# Patient Record
Sex: Female | Born: 1950 | Race: White | Hispanic: No | Smoking: Current every day smoker
Health system: Southern US, Community
[De-identification: ages and names within clinical notes are randomized; demographics above are authoritative.]

## PROBLEM LIST (undated history)

## (undated) DIAGNOSIS — H269 Unspecified cataract: Secondary | ICD-10-CM

## (undated) DIAGNOSIS — E119 Type 2 diabetes mellitus without complications: Secondary | ICD-10-CM

## (undated) DIAGNOSIS — K219 Gastro-esophageal reflux disease without esophagitis: Secondary | ICD-10-CM

## (undated) DIAGNOSIS — G629 Polyneuropathy, unspecified: Secondary | ICD-10-CM

## (undated) DIAGNOSIS — Z8659 Personal history of other mental and behavioral disorders: Secondary | ICD-10-CM

## (undated) DIAGNOSIS — J309 Allergic rhinitis, unspecified: Secondary | ICD-10-CM

## (undated) DIAGNOSIS — B1910 Unspecified viral hepatitis B without hepatic coma: Secondary | ICD-10-CM

## (undated) DIAGNOSIS — F329 Major depressive disorder, single episode, unspecified: Secondary | ICD-10-CM

## (undated) DIAGNOSIS — K922 Gastrointestinal hemorrhage, unspecified: Secondary | ICD-10-CM

## (undated) DIAGNOSIS — K5790 Diverticulosis of intestine, part unspecified, without perforation or abscess without bleeding: Secondary | ICD-10-CM

## (undated) DIAGNOSIS — N289 Disorder of kidney and ureter, unspecified: Secondary | ICD-10-CM

## (undated) DIAGNOSIS — F419 Anxiety disorder, unspecified: Secondary | ICD-10-CM

## (undated) DIAGNOSIS — G252 Other specified forms of tremor: Secondary | ICD-10-CM

## (undated) DIAGNOSIS — G510 Bell's palsy: Secondary | ICD-10-CM

## (undated) DIAGNOSIS — L409 Psoriasis, unspecified: Secondary | ICD-10-CM

## (undated) DIAGNOSIS — M1990 Unspecified osteoarthritis, unspecified site: Secondary | ICD-10-CM

## (undated) DIAGNOSIS — I10 Essential (primary) hypertension: Secondary | ICD-10-CM

## (undated) DIAGNOSIS — K7460 Unspecified cirrhosis of liver: Secondary | ICD-10-CM

## (undated) DIAGNOSIS — I81 Portal vein thrombosis: Secondary | ICD-10-CM

## (undated) DIAGNOSIS — G4700 Insomnia, unspecified: Secondary | ICD-10-CM

## (undated) DIAGNOSIS — I82409 Acute embolism and thrombosis of unspecified deep veins of unspecified lower extremity: Secondary | ICD-10-CM

## (undated) DIAGNOSIS — C189 Malignant neoplasm of colon, unspecified: Secondary | ICD-10-CM

## (undated) DIAGNOSIS — D509 Iron deficiency anemia, unspecified: Secondary | ICD-10-CM

## (undated) HISTORY — DX: Bell's palsy: G51.0

## (undated) HISTORY — DX: Unspecified cataract: H26.9

## (undated) HISTORY — PX: CATARACT EXTRACTION, BILATERAL: SHX1313

## (undated) HISTORY — PX: ANKLE SURGERY: SHX546

## (undated) HISTORY — DX: Allergic rhinitis, unspecified: J30.9

## (undated) HISTORY — DX: Polyneuropathy, unspecified: G62.9

## (undated) HISTORY — DX: Psoriasis, unspecified: L40.9

## (undated) HISTORY — DX: Unspecified viral hepatitis B without hepatic coma: B19.10

## (undated) HISTORY — DX: Anxiety disorder, unspecified: F41.9

## (undated) HISTORY — DX: Gastro-esophageal reflux disease without esophagitis: K21.9

## (undated) HISTORY — DX: Acute embolism and thrombosis of unspecified deep veins of unspecified lower extremity: I82.409

---

## 2000-08-30 ENCOUNTER — Inpatient Hospital Stay (HOSPITAL_COMMUNITY): Admission: EM | Admit: 2000-08-30 | Discharge: 2000-09-01 | Payer: Self-pay | Admitting: Emergency Medicine

## 2000-08-30 ENCOUNTER — Encounter: Payer: Self-pay | Admitting: Emergency Medicine

## 2000-08-30 ENCOUNTER — Encounter: Payer: Self-pay | Admitting: Orthopedic Surgery

## 2000-11-04 ENCOUNTER — Encounter: Admission: RE | Admit: 2000-11-04 | Discharge: 2001-02-02 | Payer: Self-pay | Admitting: Orthopedic Surgery

## 2007-04-09 ENCOUNTER — Emergency Department (HOSPITAL_COMMUNITY): Admission: EM | Admit: 2007-04-09 | Discharge: 2007-04-09 | Payer: Self-pay | Admitting: Emergency Medicine

## 2007-04-09 IMAGING — CT CT HEAD W/O CM
1 series · 16 of 30 positions shown, 20 images · IV contrast (agent unspecified)
Comparison: None.

CLINICAL DATA: Right facial droop since this morning.
 HEAD CT WITHOUT CONTRAST:
TECHNIQUE: Contiguous axial images were obtained from the base of the skull through the vertex according to standard protocol without contrast.

[Series 2: head_seq 4.5 h37s st · axial · 0.43mm/px · z∈[-457,-313]mm · 16 of 36 slices shown, 20 images]
[im 2/36  brain]
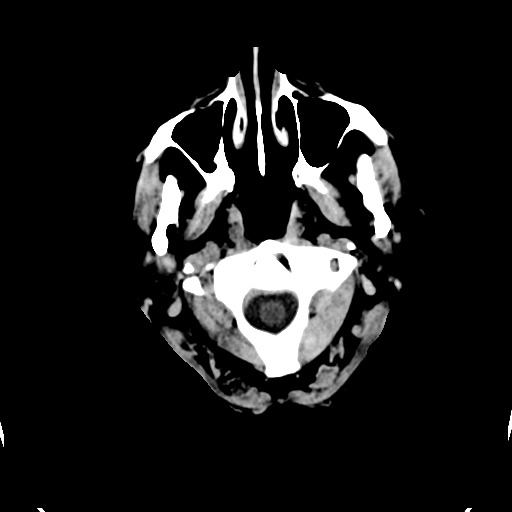
[im 2/36  bone]
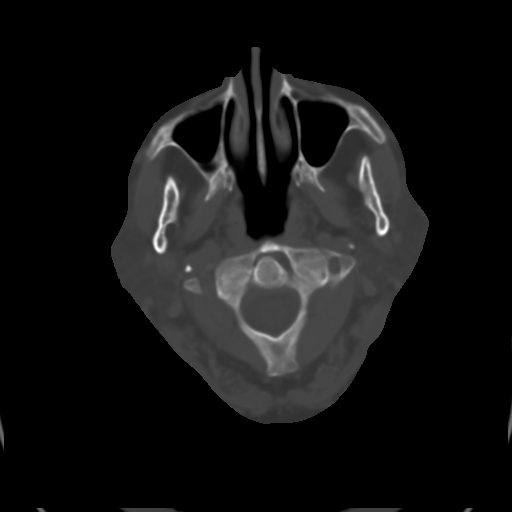
[im 4/36  brain]
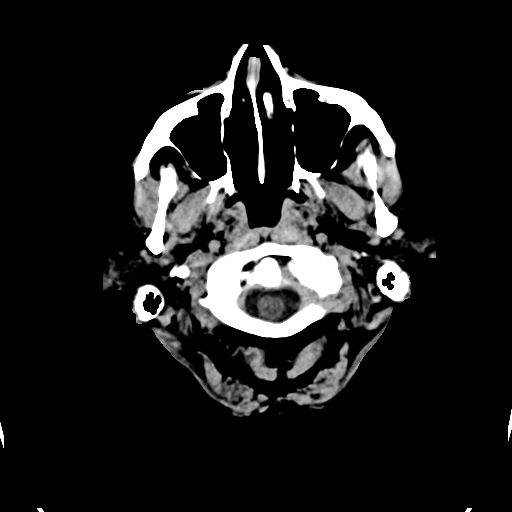
[im 7/36  brain]
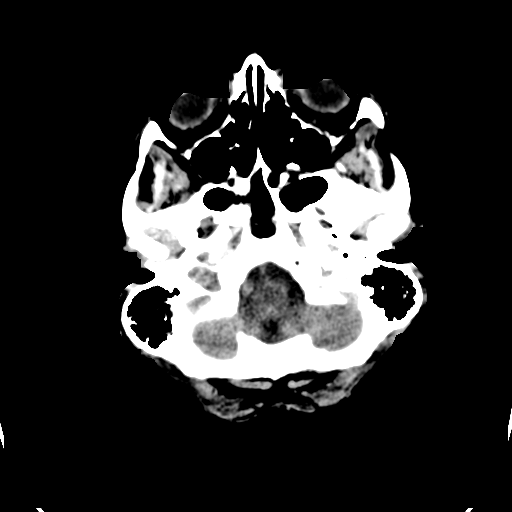
[im 9/36  brain]
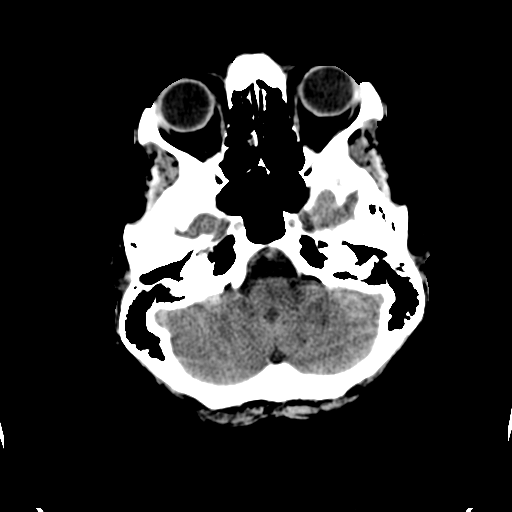
[im 10/36  brain]
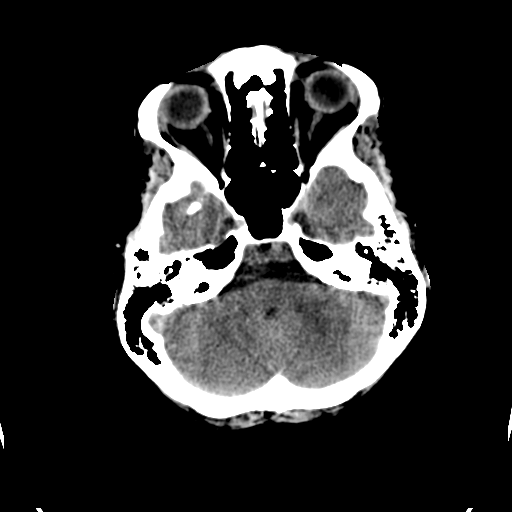
[im 10/36  bone]
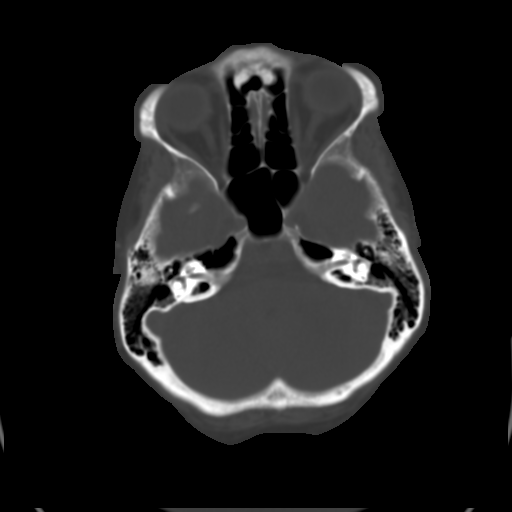
[im 13/36  brain]
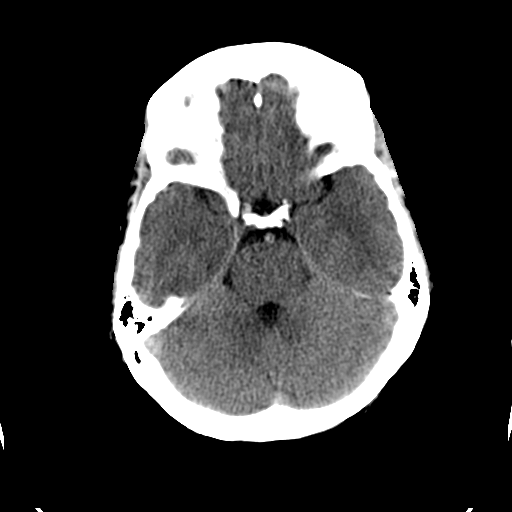
[im 15/36  brain]
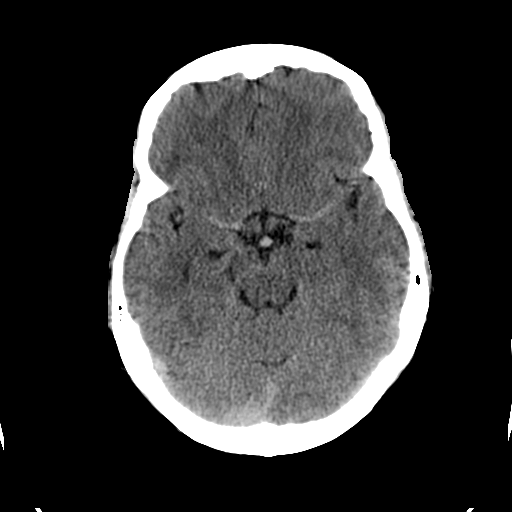
[im 17/36  brain]
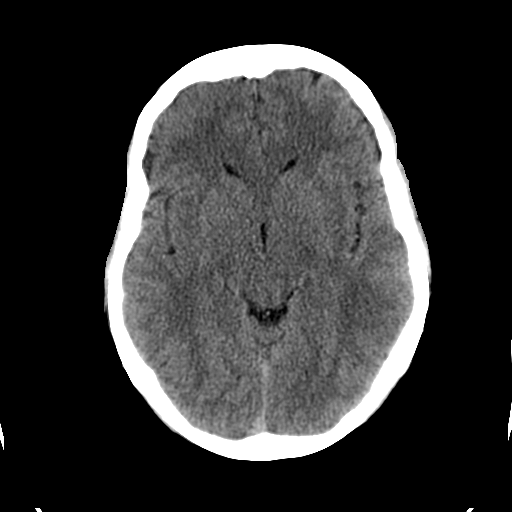
[im 19/36  brain]
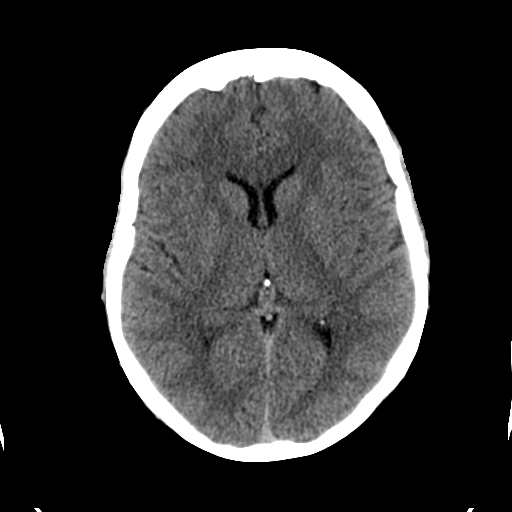
[im 19/36  bone]
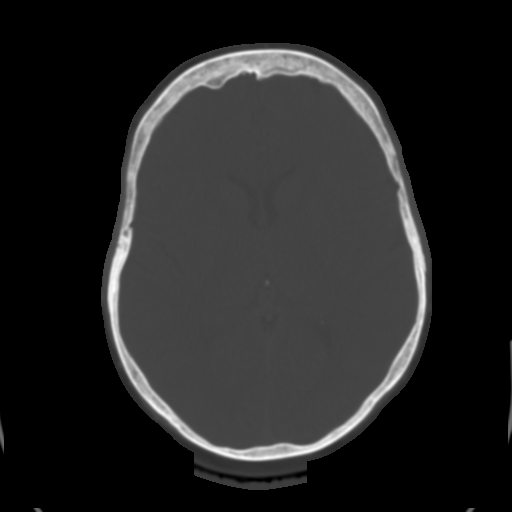
[im 21/36  brain]
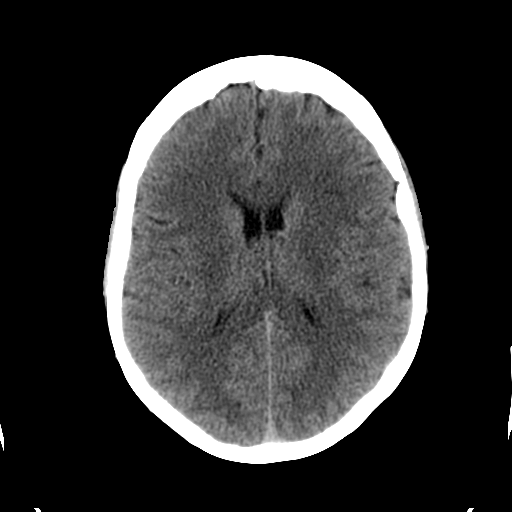
[im 23/36  brain]
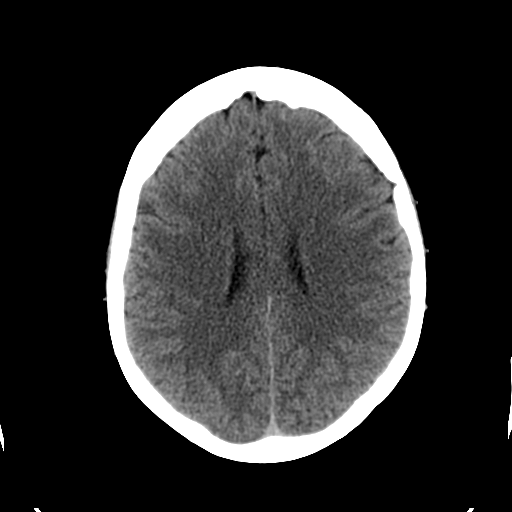
[im 26/36  brain]
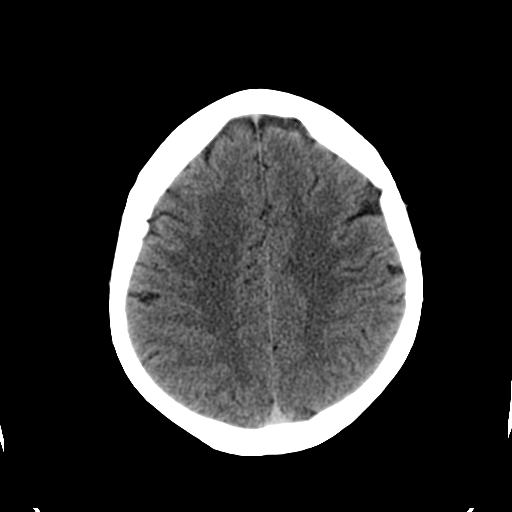
[im 27/36  brain]
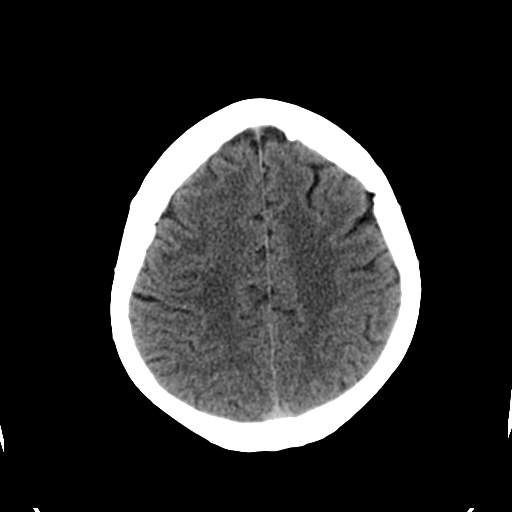
[im 27/36  bone]
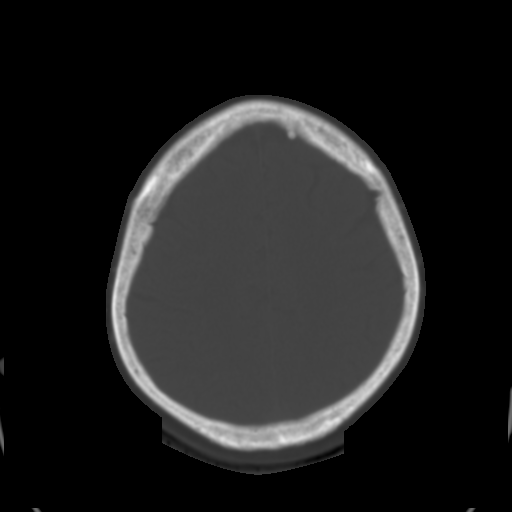
[im 29/36  brain]
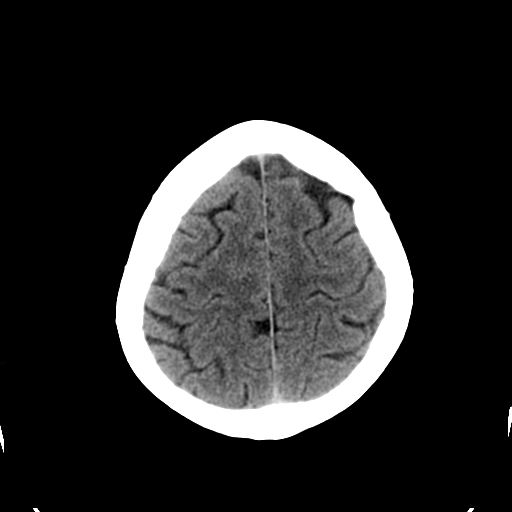
[im 32/36  brain]
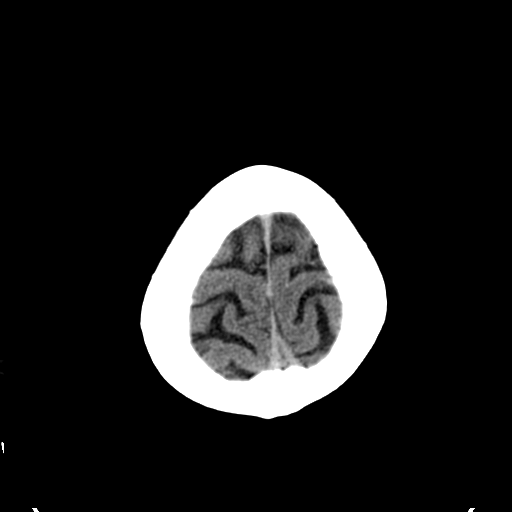
[im 34/36  brain]
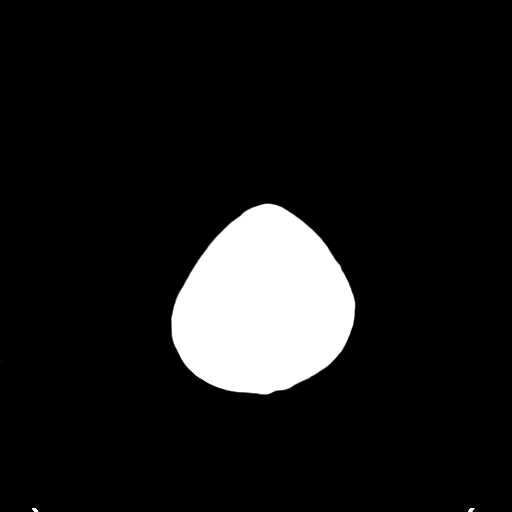

[16 of 30 positions shown; findings below may reference images not displayed]

FINDINGS: No evidence of acute infarct, hemorrhage, mass, mass effect or hydrocephalus.  Visualized paranasal sinuses and mastoid air cells are clear with the exception of opacification of a single right ethmoid air cell.
IMPRESSION: No acute findings.

## 2010-09-15 NOTE — H&P (Signed)
[place]  Patient:    Tonya Dougherty, Tonya Dougherty                        MRN: [id number] Adm. Date:  [id number] Attending:  Wende Mott                         History and Physical  CHIEF COMPLAINT: Painful, deformed right ankle.  HISTORY OF PRESENT ILLNESS: This patient is a 60 year old female who was coming down the stairs with a drawer in her hand and missed a step, turning the right ankle.  The patient developed severe pain and deformity of the right ankle and also pain and swelling of the left ankle.  There was no loss of consciousness or any other injuries.  The patient was brought to [facility] for treatment.  CURRENT MEDICATIONS: Prozac 20 mg q.d.  ALLERGIES: None known.  SOCIAL HISTORY: Smokes half a pack a day.  FAMILY HISTORY: Noncontributory.  PAST SURGICAL HISTORY: No previous operations.  PHYSICAL EXAMINATION:  VITAL SIGNS: Temperature 97.7 degrees, pulse 97, respirations 20, blood pressure 160/98.  HEENT: Normocephalic.  Conjunctivae and sclerae clear.  NECK: Supple.  CHEST: Clear.  CARDIAC: S1 and S2, regular.  EXTREMITIES: Right ankle grossly deformed with obvious anterior dislocation, tender and swollen.  Nailbeds pink and blanche.  Dorsalis pedis sluggish to palpation.  Left ankle tender and swollen over the lateral malleolus.  Range of motion is good.  Good active and passive range of motion.  Tender anterior talofibular and calcaneofibular ligament.  LABORATORY DATA: X-ray revealed fracture dislocation of the right ankle.  Left of left ankle shows only a small chip avulsion off the lateral malleolus.  IMPRESSION:  1. Fracture dislocation of right ankle.  2. Spurring of left ankle. DD:  08/30/00 TD:  09/01/00 Job: [id number]

## 2010-09-15 NOTE — Discharge Summary (Signed)
[facility]  Patient:    Tonya Dougherty, Tonya Dougherty                        MRN: [id number] Adm. Date:  [id number] Disc. Date: [id number] Attending:  Wende Mott                           Discharge Summary  ADMITTING DIAGNOSES: 1. Bimalleolar fracture dislocation, right ankle. 2. Sprain of the left ankle.  DISCHARGE DIAGNOSES: 1. Bimalleolar fracture dislocation, right ankle. 2. Sprain of the left ankle.  COMPLICATIONS:  None.  INFECTIONS:  None.  OPERATION:  Open reduction and internal fixation of right ankle.  PERTINENT HISTORY:  This is a 60 year old female who had fallen down some stairs and sustained injury to both ankles.  Patient came to [facility] emergency room for treatment.  PERTINENT PHYSICAL EXAMINATION:  EXTREMITIES:  Right ankle - gross deformity with obvious anterior dislocation, tenderness, and swelling.  Nail beds pink and ______ quite well.  Sluggish toe movement.  Left ankle - tender, swollen lateral malleoli.  Range of motion was good.  Tender anterior talofibular and calcaneofibular ligament.  LABORATORY DATA:  X-rays revealed fracture dislocation, bimalleolar, right ankle.  Left ankle - small chip avulsion off the lateral malleolus.  HOSPITAL COURSE:  Patient was placed in an ankle brace over the left ankle, underwent ORIF of the right ankle, and an application of a short-leg cast postoperatively.  Course was benign.  Patients pain was under control with p.o. Percocet, ice packs, elevation, use of crutches, nonweightbearing on the right side.  Patient had more difficulty due to left ankle sprain and progressed fairly well but, still, was having to use a wheelchair due to difficulty with the left ankle.  Patient then became stable enough to be discharged on Daypro 670 b.i.d., Percocet one q.4h. p.r.n. for pain, ice packs, elevation, nonweightbearing on the right side, and to return to the office in two weeks.   Patient was discharged in stable and satisfactory condition. DD:  11/20/00 TD:  11/20/00 Job: [id number]

## 2013-03-19 ENCOUNTER — Inpatient Hospital Stay: Payer: Self-pay | Admitting: Internal Medicine

## 2013-03-19 LAB — COMPREHENSIVE METABOLIC PANEL
Albumin: 2.1 g/dL — ABNORMAL LOW (ref 3.4–5.0)
Alkaline Phosphatase: 94 U/L (ref 50–136)
Anion Gap: 12 (ref 7–16)
BUN: 27 mg/dL — ABNORMAL HIGH (ref 7–18)
Bilirubin,Total: 0.3 mg/dL (ref 0.2–1.0)
Calcium, Total: 7.2 mg/dL — ABNORMAL LOW (ref 8.5–10.1)
Chloride: 112 mmol/L — ABNORMAL HIGH (ref 98–107)
Co2: 18 mmol/L — ABNORMAL LOW (ref 21–32)
Creatinine: 1.03 mg/dL (ref 0.60–1.30)
EGFR (African American): >60
EGFR (Non-African Amer.): 58 — ABNORMAL LOW
Glucose: 237 mg/dL — ABNORMAL HIGH (ref 65–99)
Osmolality: 296 (ref 275–301)
Potassium: 3.3 mmol/L — ABNORMAL LOW (ref 3.5–5.1)
SGOT(AST): 74 U/L — ABNORMAL HIGH (ref 15–37)
SGPT (ALT): 28 U/L (ref 12–78)
Sodium: 142 mmol/L (ref 136–145)
Total Protein: 4.7 g/dL — ABNORMAL LOW (ref 6.4–8.2)

## 2013-03-19 LAB — CBC WITH DIFFERENTIAL/PLATELET
Basophil #: 0 10*3/uL (ref 0.0–0.1)
Basophil %: 0 %
Eosinophil #: 0 10*3/uL (ref 0.0–0.7)
Eosinophil %: 0.1 %
HCT: 24.4 % — ABNORMAL LOW (ref 35.0–47.0)
Lymphocyte #: 1.9 10*3/uL (ref 1.0–3.6)
MCHC: 32.2 g/dL (ref 32.0–36.0)
Monocyte %: 5.5 %
Neutrophil #: 17.9 10*3/uL — ABNORMAL HIGH (ref 1.4–6.5)
Platelet: 173 10*3/uL (ref 150–440)

## 2013-03-19 LAB — CBC
HCT: 17.6 % — ABNORMAL LOW (ref 35.0–47.0)
HGB: 5.3 g/dL — ABNORMAL LOW (ref 12.0–16.0)
MCH: 25.4 pg — ABNORMAL LOW (ref 26.0–34.0)
MCHC: 30.2 g/dL — ABNORMAL LOW (ref 32.0–36.0)
MCV: 84 fL (ref 80–100)
Platelet: 196 10*3/uL (ref 150–440)
RBC: 2.09 10*6/uL — ABNORMAL LOW (ref 3.80–5.20)
RDW: 21 % — ABNORMAL HIGH (ref 11.5–14.5)
WBC: 12.6 10*3/uL — ABNORMAL HIGH (ref 3.6–11.0)

## 2013-03-19 LAB — TROPONIN I: Troponin-I: 0.06 ng/mL — ABNORMAL HIGH

## 2013-03-19 LAB — PROTIME-INR
INR: 1.5
Prothrombin Time: 17.9 secs — ABNORMAL HIGH (ref 11.5–14.7)

## 2013-03-19 LAB — IRON AND TIBC
Iron Bind.Cap.(Total): 342 ug/dL (ref 250–450)
Iron: 31 ug/dL — ABNORMAL LOW (ref 50–170)
Unbound Iron-Bind.Cap.: 311 ug/dL

## 2013-03-19 IMAGING — US ABDOMEN ULTRASOUND LIMITED
1 series · 14 of 25 positions shown · non-contrast
Comparison: None.

CLINICAL DATA: Elevated liver function tests.

EXAM:
US ABDOMEN LIMITED - RIGHT UPPER QUADRANT

[Series 1: abdomen ultrasound limited · 0.31mm/px · 14 of 46 slices shown]
[im 1/46]
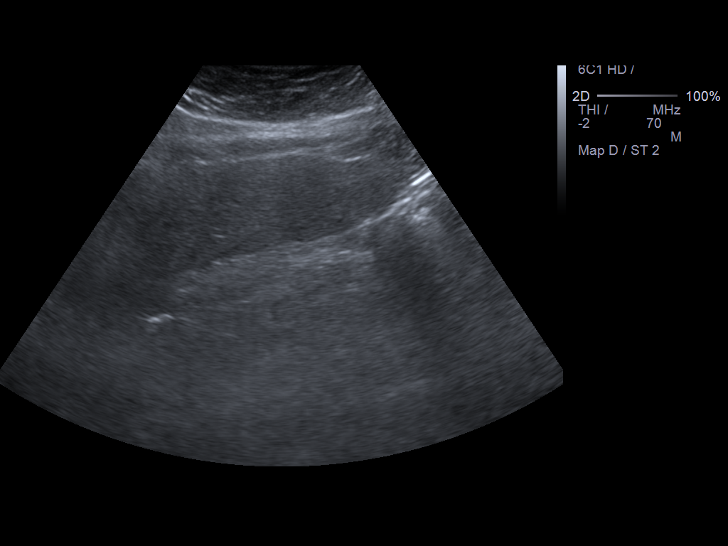
[im 4/46]
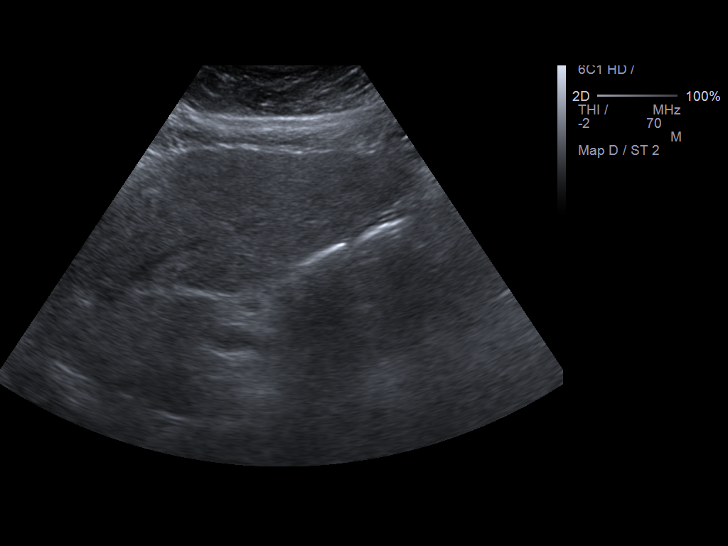
[im 8/46]
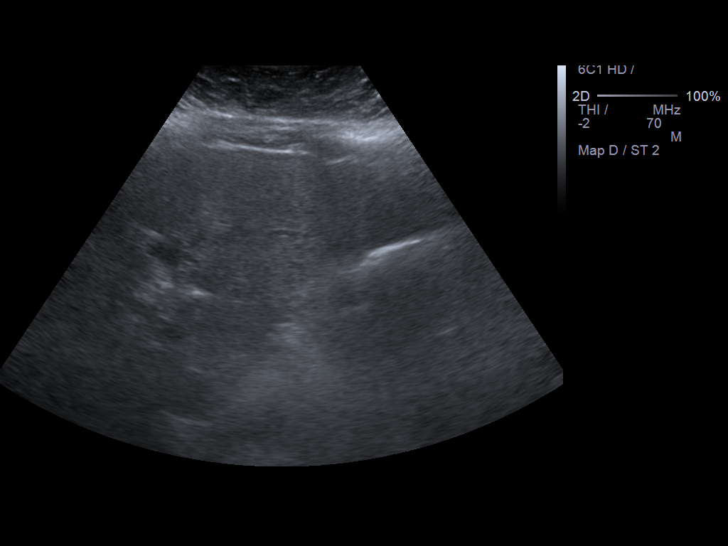
[im 12/46]
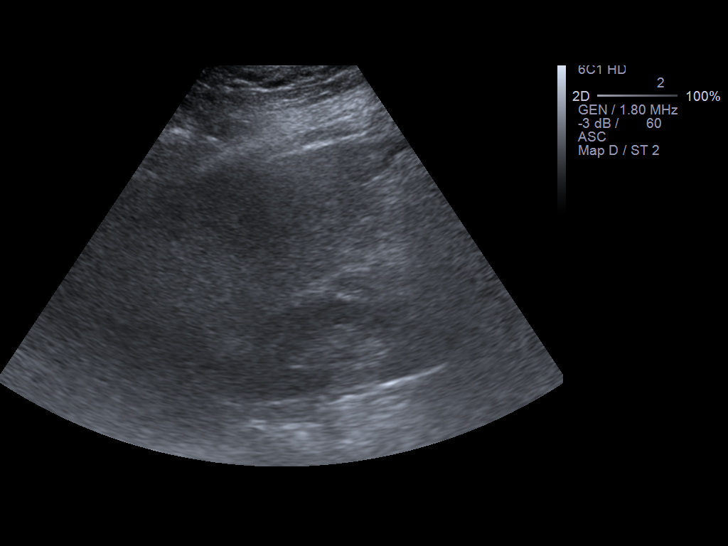
[im 16/46]
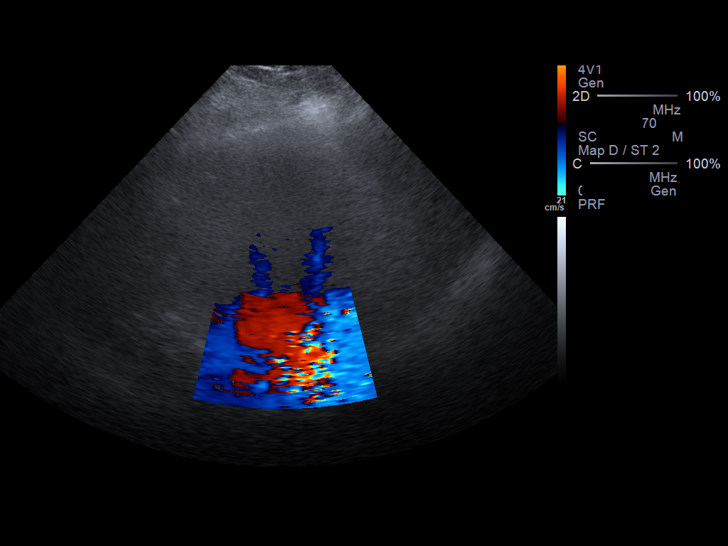
[im 17/46]
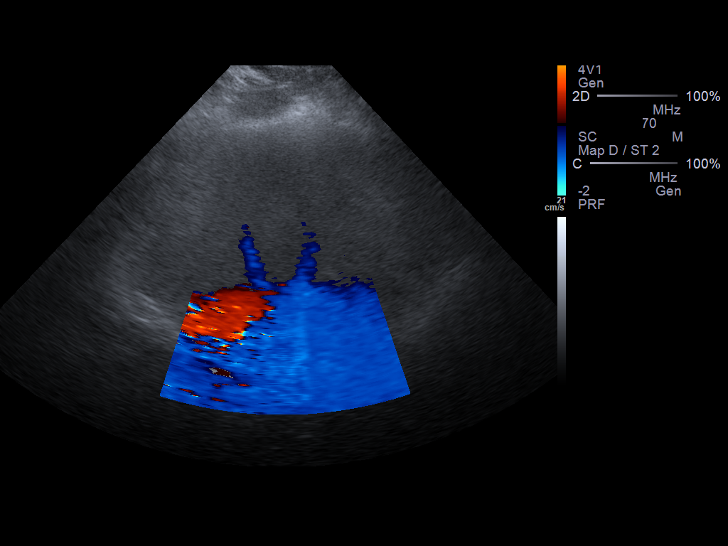
[im 21/46]
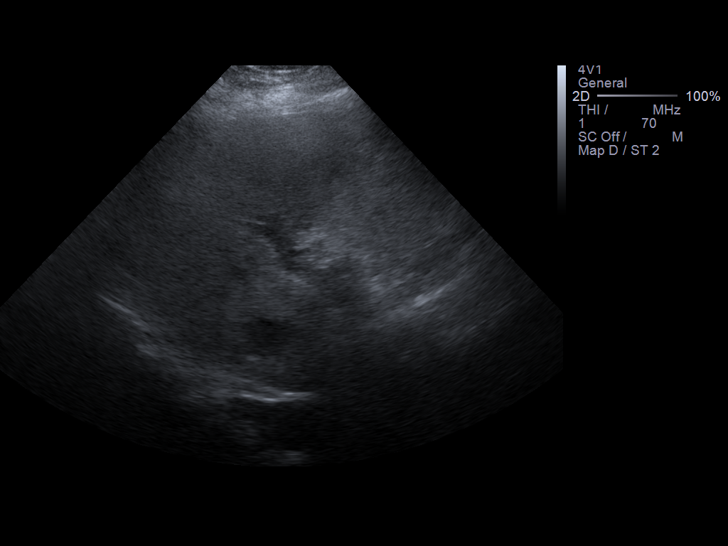
[im 25/46]
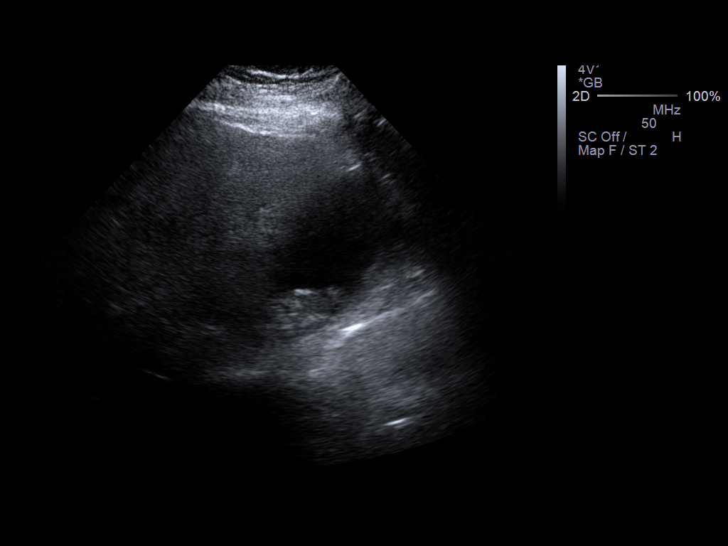
[im 29/46]
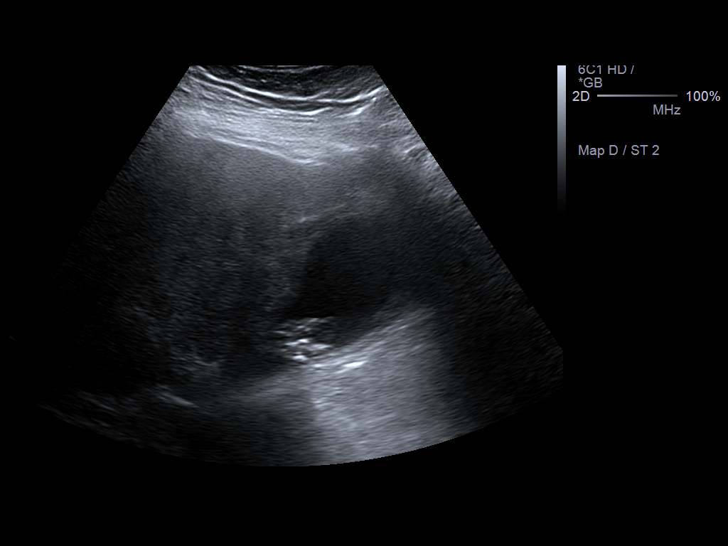
[im 31/46]
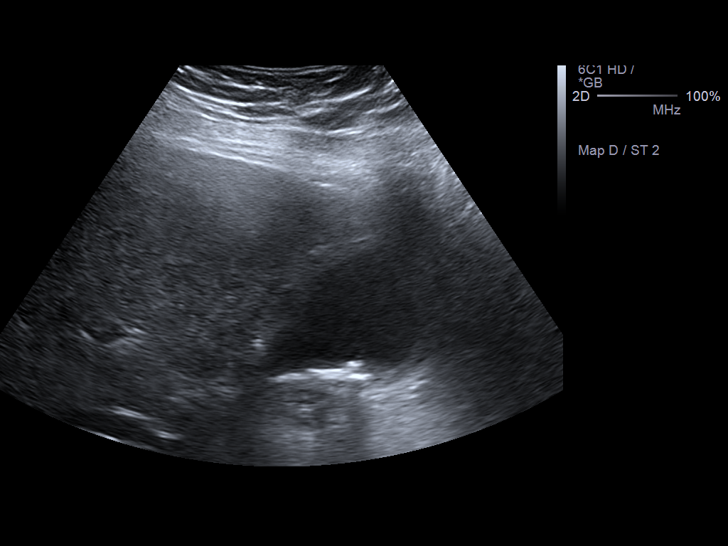
[im 34/46]
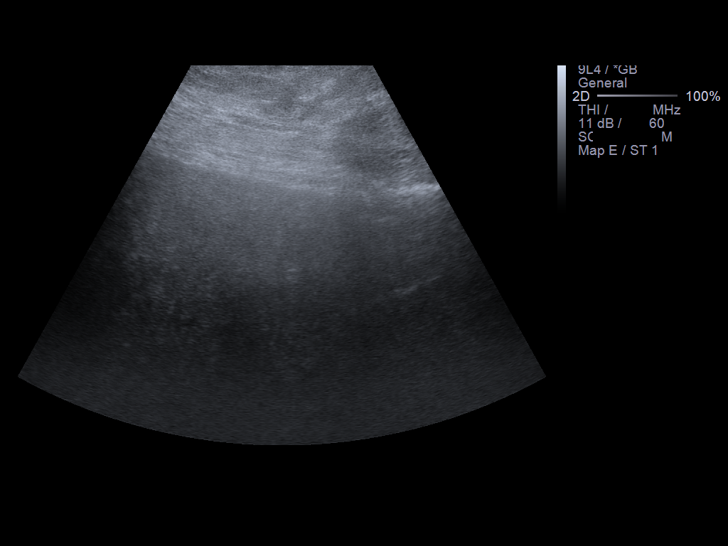
[im 38/46]
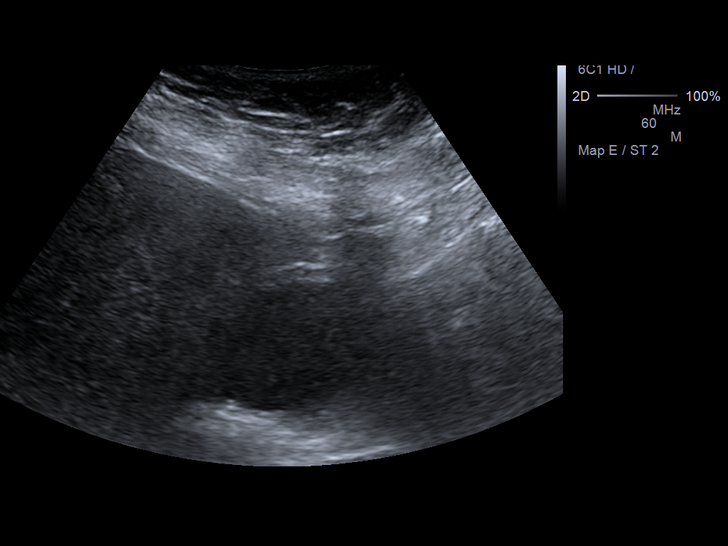
[im 42/46]
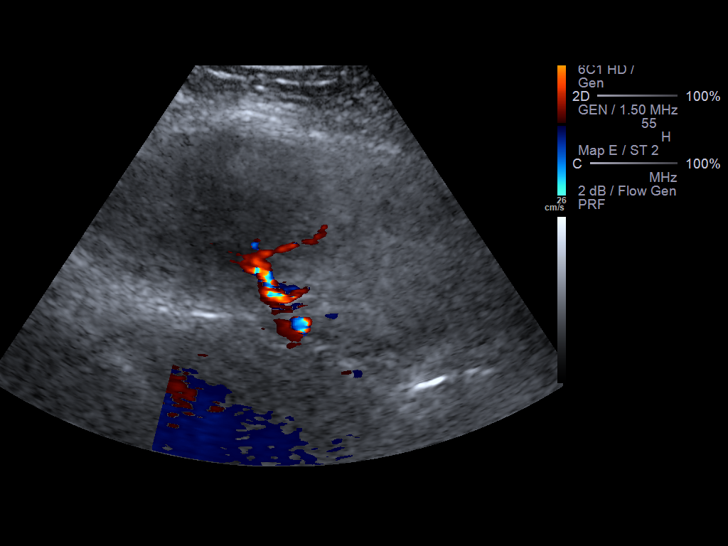
[im 46/46]
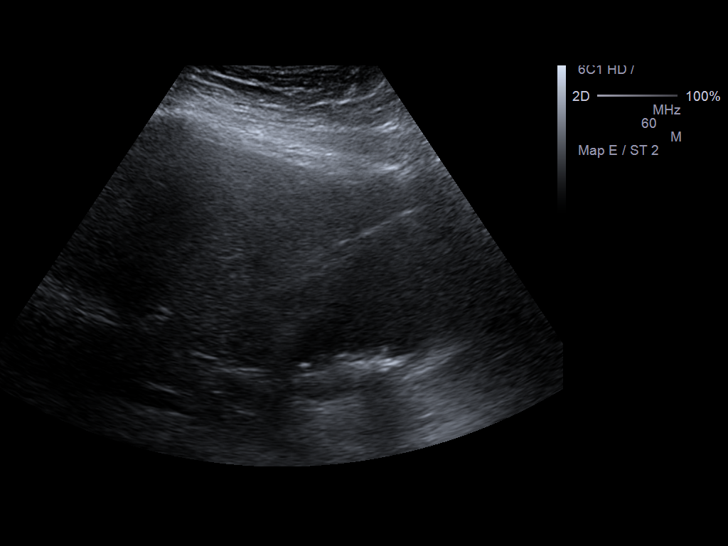

[14 of 25 positions shown; findings below may reference images not displayed]

FINDINGS: Gallbladder

Multiple gallstones and sludge are present. Gallbladder wall is
measured at 6 mm consistent with moderate thickening. No sonographic
Murphy's sign is noted.

Common bile duct

Diameter: Measures 4.9 mm which is within normal limits.

Liver:

No focal abnormality is noted. Coarse echotexture of hepatic
parenchyma is noted with possible nodular contour suggesting hepatic
cirrhosis or other diffuse hepatocellular disease.
IMPRESSION: Cholelithiasis is noted with associated gallbladder wall thickening;
cholecystitis cannot be excluded and HIDA scan may be performed for
further evaluation. Coarse echotexture of hepatic parenchyma is
noted with possible nodular contour suggesting hepatic cirrhosis or
other diffuse hepatocellular disease.

## 2013-03-20 LAB — BASIC METABOLIC PANEL
Anion Gap: 6 — ABNORMAL LOW (ref 7–16)
BUN: 25 mg/dL — ABNORMAL HIGH (ref 7–18)
Co2: 24 mmol/L (ref 21–32)
Creatinine: 0.78 mg/dL (ref 0.60–1.30)
EGFR (African American): >60
EGFR (Non-African Amer.): >60
Glucose: 143 mg/dL — ABNORMAL HIGH (ref 65–99)
Osmolality: 299 (ref 275–301)
Potassium: 4.2 mmol/L (ref 3.5–5.1)

## 2013-03-20 LAB — PROTIME-INR: INR: 1.3

## 2013-03-20 LAB — CBC WITH DIFFERENTIAL/PLATELET
Eosinophil #: 0.1 10*3/uL (ref 0.0–0.7)
Lymphocyte #: 3.2 10*3/uL (ref 1.0–3.6)
MCHC: 32 g/dL (ref 32.0–36.0)
Monocyte #: 0.9 x10 3/mm (ref 0.2–0.9)

## 2013-03-20 LAB — HEMOGLOBIN: HGB: 8.2 g/dL — ABNORMAL LOW (ref 12.0–16.0)

## 2013-03-21 LAB — COMPREHENSIVE METABOLIC PANEL
Albumin: 2.3 g/dL — ABNORMAL LOW (ref 3.4–5.0)
Bilirubin,Total: 0.7 mg/dL (ref 0.2–1.0)
Chloride: 114 mmol/L — ABNORMAL HIGH (ref 98–107)
Creatinine: 0.73 mg/dL (ref 0.60–1.30)
EGFR (African American): >60
Glucose: 126 mg/dL — ABNORMAL HIGH (ref 65–99)
SGOT(AST): 150 U/L — ABNORMAL HIGH (ref 15–37)
SGPT (ALT): 97 U/L — ABNORMAL HIGH (ref 12–78)
Sodium: 142 mmol/L (ref 136–145)

## 2013-03-21 LAB — CBC WITH DIFFERENTIAL/PLATELET
Basophil #: 0.1 10*3/uL (ref 0.0–0.1)
Eosinophil #: 0.1 10*3/uL (ref 0.0–0.7)
HGB: 7.8 g/dL — ABNORMAL LOW (ref 12.0–16.0)
Lymphocyte #: 3.1 10*3/uL (ref 1.0–3.6)
MCH: 28.4 pg (ref 26.0–34.0)
MCV: 87 fL (ref 80–100)
Monocyte #: 1 x10 3/mm — ABNORMAL HIGH (ref 0.2–0.9)
Monocyte %: 8.7 %
RDW: 18.3 % — ABNORMAL HIGH (ref 11.5–14.5)

## 2013-03-22 LAB — CBC WITH DIFFERENTIAL/PLATELET
Basophil #: 0.1 10*3/uL (ref 0.0–0.1)
Eosinophil #: 0.1 10*3/uL (ref 0.0–0.7)
Eosinophil %: 0.9 %
HCT: 23.1 % — ABNORMAL LOW (ref 35.0–47.0)
HGB: 7.5 g/dL — ABNORMAL LOW (ref 12.0–16.0)
Lymphocyte #: 2.5 10*3/uL (ref 1.0–3.6)
Lymphocyte %: 18.2 %
MCH: 28.2 pg (ref 26.0–34.0)
MCHC: 32.5 g/dL (ref 32.0–36.0)
MCV: 87 fL (ref 80–100)
RBC: 2.66 10*6/uL — ABNORMAL LOW (ref 3.80–5.20)
RDW: 18 % — ABNORMAL HIGH (ref 11.5–14.5)
WBC: 14 10*3/uL — ABNORMAL HIGH (ref 3.6–11.0)

## 2013-03-23 LAB — CBC WITH DIFFERENTIAL/PLATELET
Basophil %: 0.8 %
Eosinophil %: 1.4 %
HCT: 23.8 % — ABNORMAL LOW (ref 35.0–47.0)
HGB: 7.7 g/dL — ABNORMAL LOW (ref 12.0–16.0)
Lymphocyte #: 3.3 10*3/uL (ref 1.0–3.6)
Lymphocyte %: 27.7 %
MCH: 28.3 pg (ref 26.0–34.0)
MCV: 88 fL (ref 80–100)
Monocyte #: 1.4 x10 3/mm — ABNORMAL HIGH (ref 0.2–0.9)
Neutrophil #: 6.9 10*3/uL — ABNORMAL HIGH (ref 1.4–6.5)
Neutrophil %: 58.3 %
RDW: 18 % — ABNORMAL HIGH (ref 11.5–14.5)

## 2013-03-24 LAB — CBC WITH DIFFERENTIAL/PLATELET
Basophil #: 0.1 10*3/uL (ref 0.0–0.1)
Eosinophil #: 0.2 10*3/uL (ref 0.0–0.7)
Lymphocyte #: 2.2 10*3/uL (ref 1.0–3.6)
Lymphocyte %: 22.1 %
MCH: 28 pg (ref 26.0–34.0)
MCV: 88 fL (ref 80–100)
Monocyte #: 1.2 x10 3/mm — ABNORMAL HIGH (ref 0.2–0.9)
Monocyte %: 12.2 %
Neutrophil #: 6.3 10*3/uL (ref 1.4–6.5)
Platelet: 184 10*3/uL (ref 150–440)
WBC: 10.1 10*3/uL (ref 3.6–11.0)

## 2013-03-27 ENCOUNTER — Emergency Department: Payer: Self-pay | Admitting: Emergency Medicine

## 2013-03-27 LAB — COMPREHENSIVE METABOLIC PANEL
BUN: 7 mg/dL (ref 7–18)
Bilirubin,Total: 0.7 mg/dL (ref 0.2–1.0)
Calcium, Total: 7.5 mg/dL — ABNORMAL LOW (ref 8.5–10.1)
Co2: 25 mmol/L (ref 21–32)
Creatinine: 0.83 mg/dL (ref 0.60–1.30)
EGFR (African American): >60
Glucose: 163 mg/dL — ABNORMAL HIGH (ref 65–99)
Osmolality: 283 (ref 275–301)
Potassium: 3.5 mmol/L (ref 3.5–5.1)
Sodium: 141 mmol/L (ref 136–145)
Total Protein: 4.9 g/dL — ABNORMAL LOW (ref 6.4–8.2)

## 2013-03-27 LAB — CBC WITH DIFFERENTIAL/PLATELET
Basophil #: 0.1 10*3/uL (ref 0.0–0.1)
Basophil %: 0.9 %
HCT: 18.9 % — ABNORMAL LOW (ref 35.0–47.0)
HGB: 5.9 g/dL — ABNORMAL LOW (ref 12.0–16.0)
MCH: 26.6 pg (ref 26.0–34.0)
MCV: 86 fL (ref 80–100)
Monocyte #: 1.3 x10 3/mm — ABNORMAL HIGH (ref 0.2–0.9)
Monocyte %: 8.7 %
Neutrophil #: 11.2 10*3/uL — ABNORMAL HIGH (ref 1.4–6.5)
Platelet: 258 10*3/uL (ref 150–440)
RBC: 2.2 10*6/uL — ABNORMAL LOW (ref 3.80–5.20)
WBC: 14.4 10*3/uL — ABNORMAL HIGH (ref 3.6–11.0)

## 2013-05-05 DIAGNOSIS — R5381 Other malaise: Secondary | ICD-10-CM | POA: Diagnosis present

## 2013-06-12 DIAGNOSIS — K7460 Unspecified cirrhosis of liver: Secondary | ICD-10-CM | POA: Diagnosis present

## 2014-08-20 NOTE — Consult Note (Signed)
Chief Complaint:  Subjective/Chief Complaint No further bleeding over night. U/S showed cirrhosis.   VITAL SIGNS/ANCILLARY NOTES: **Vital Signs.:   21-Nov-14 07:30  Vital Signs Type Routine  Temperature Temperature (F) 98.5  Celsius 36.9  Temperature Source oral  Pulse Pulse 90  Pulse source if not from Vital Sign Device per cardiac monitor  Respirations Respirations 16  Systolic BP Systolic BP 224  Diastolic BP (mmHg) Diastolic BP (mmHg) 54  Mean BP 75  BP Source  if not from Vital Sign Device non-invasive  Pulse Ox % Pulse Ox % 99  Pulse Ox Activity Level  At rest  Oxygen Delivery 2L; Nasal Cannula  Pulse Ox Heart Rate 92   Brief Assessment:  GEN no acute distress   Cardiac Regular  murmur present   Respiratory clear BS   Gastrointestinal Normal   Lab Results: Routine Chem:  21-Nov-14 05:04   Glucose, Serum  143  BUN  25  Creatinine (comp) 0.78  Sodium, Serum  147  Potassium, Serum 4.2  Chloride, Serum  117  CO2, Serum 24  Calcium (Total), Serum  7.5  Anion Gap  6  Osmolality (calc) 299  eGFR (African American) >60  eGFR (Non-African American) >60 (eGFR values <80m/min/1.73 m2 may be an indication of chronic kidney disease (CKD). Calculated eGFR is useful in patients with stable renal function. The eGFR calculation will not be reliable in acutely ill patients when serum creatinine is changing rapidly. It is not useful in  patients on dialysis. The eGFR calculation may not be applicable to patients at the low and high extremes of body sizes, pregnant women, and vegetarians.)  Magnesium, Serum 2.1 (1.8-2.4 THERAPEUTIC RANGE: 4-7 mg/dL TOXIC: > 10 mg/dL  -----------------------)  Hemoglobin A1c ([facility])  6.5 (The American Diabetes Association recommends that a primary goal of therapy should be <7% and that physicians should reevaluate the treatment regimen in patients with HbA1c values consistently >8%.)  Routine Coag:  21-Nov-14 05:04   Prothrombin  15.9   INR 1.3 (INR reference interval applies to patients on anticoagulant therapy. A single INR therapeutic range for coumarins is not optimal for all indications; however, the suggested range for most indications is 2.0 - 3.0. Exceptions to the INR Reference Range may include: Prosthetic heart valves, acute myocardial infarction, prevention of myocardial infarction, and combinations of aspirin and anticoagulant. The need for a higher or lower target INR must be assessed individually. Reference: The Pharmacology and Management of the Vitamin K  antagonists: the seventh ACCP Conference on Antithrombotic and Thrombolytic Therapy. CMGNOI.3704Sept:126 (3suppl): [id number] A HCT value >55% may artifactually increase the PT.  In one study,  the increase was an average of 25%. Reference:  "Effect on Routine and Special Coagulation Testing Values of Citrate Anticoagulant Adjustment in Patients with High HCT Values." American Journal of Clinical Pathology 2006;126:400-405.)  Routine Hem:  21-Nov-14 05:04   WBC (CBC) 10.9  RBC (CBC)  2.46  Hemoglobin (CBC)  6.8  Hematocrit (CBC)  21.3  Platelet Count (CBC) 178  MCV 87  MCH 27.7  MCHC 32.0  RDW  19.8  Neutrophil % 60.4  Lymphocyte % 29.8  Monocyte % 8.5  Eosinophil % 0.7  Basophil % 0.6  Neutrophil #  6.6  Lymphocyte # 3.2  Monocyte # 0.9  Eosinophil # 0.1  Basophil # 0.1 (Result(s) reported on 20 Mar 2013 at 07:32AM.)   Radiology Results: [place]    20-Nov-14 10:12, [place]  [place]  REASON FOR EXAM:    elevated lfts  COMMENTS:   Body Site: GB and Fossa, CBD; Liver    PROCEDURE: [place]  - US ABDOMEN LIMITED SURVEY  - Mar 19 2013 10:12AM     CLINICAL DATA:  Elevated liver function tests.    EXAM:  US ABDOMEN LIMITED - RIGHT UPPER QUADRANT    COMPARISON:  None.    FINDINGS:  Gallbladder  Multiple gallstones and sludge are present. Gallbladder wall is  measured at 6 mm consistent with moderate  thickening. No sonographic  Murphy's sign is noted.    Common bile duct    Diameter: Measures 4.9 mm which is within normal limits.    Liver:    No focal abnormality is noted. Coarse echotexture of hepatic  parenchyma is noted with possible nodular contour suggesting hepatic  cirrhosis or other diffuse hepatocellular disease.     IMPRESSION:  Cholelithiasis is noted with associated gallbladder wall thickening;  cholecystitis cannot be excluded and HIDA scan may be performed for  further evaluation. Coarse echotexture of hepatic parenchyma is  noted with possible nodular contour suggesting hepatic cirrhosis or  other diffuse hepatocellular disease.      Electronically Signed    By: Sabino Dick M.D.    On: 03/19/2013 10:15         Verified By: Marveen Reeks, M.D.,   Assessment/Plan:  Assessment/Plan:  Assessment Large gastric varices. Cirrhosis.   Plan Keep patient on octreotide drip/protonix. Consider tapering octreotide off over the weekend and replace with either inderal or nadolol daily. Keep patient in ICU at least one more day. Will have Dr. Gustavo Lah see patient over the weekend. If patient has recurrent bleeding, transfer patient to a tertiary center for TIPS, etc. Thanks.   Electronic Signatures: Verdie Shire (MD)  (Signed 21-Nov-14 08:00)  Authored: Chief Complaint, VITAL SIGNS/ANCILLARY NOTES, Brief Assessment, Lab Results, Radiology Results, Assessment/Plan   Last Updated: 21-Nov-14 08:00 by Verdie Shire (MD)

## 2014-08-20 NOTE — Consult Note (Signed)
PATIENT NAME:  Tonya Dougherty, Tonya Dougherty MR#:  [id number] DATE OF BIRTH:  11/06/1950  DATE OF CONSULTATION:  03/27/2013  REFERRING PHYSICIAN:  Dr. Thomasene Lot of Emergency Room.  CONSULTING PHYSICIAN:  Hosteen Kienast R. Isbella Arline, MD  CHIEF COMPLAINT:  Weakness.   HISTORY OF PRESENTING ILLNESS:  The patient was seen and examined on 03/27/2013.   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 The patient had black, tarry stools with stool positive. Hemoglobin down to 5.9 from 7.5. The patient is also hypotensive in the 80s. She does feel lightheaded.   The patient recently had an endoscopy done a week prior which showed esophageal varices with oozing blood. The plan was to transfer the patient for TIPS if she had any recurrent bleeds.   PAST MEDICAL HISTORY:  1.  Chronic hep B.  2.  Cirrhosis.  3.  Anxiety.  4.  Depression.  5.  Bell's palsy.   PAST SURGICAL HISTORY:  The patient had a recent endoscopy which showed oozing blood from esophageal varices.   ALLERGIES:  No known drug allergies.   FAMILY HISTORY:  No family history of cancers or cirrhosis.   SOCIAL HISTORY:  The patient smokes 1/2 pack a day. She did drink alcohol in her 57s, but quit since then. Does use any illicit drugs. Lives at home with a roommate.   REVIEW OF SYSTEMS:  Please see history of illness.  CONSTITUTIONAL:  Has fatigue, weakness, and weight loss.  EYES:  No blurred vision, pain, redness.   ENT:  No tinnitus, ear pain, hearing loss.  RESPIRATORY:  No cough, wheezing.  CARDIOVASCULAR:  Has some palpitations.  GASTROINTESTINAL:  Had black, tarry stools. A history of varices.  GENITOURINARY:  No dysuria, hematuria.  HEMATOLOGIC AND LYMPHATIC:  Has anemia. No easy bruising. Does have GI bleed.  MUSCULOSKELETAL:  Has arthritis.  PSYCHIATRIC:  No anxiety or depression.    HOME MEDICATIONS:  Include:  1.  Celexa 10 mg oral once a day.  2.  Nadolol 40 mg oral once a day.  3.  Protonix 40 mg daily.  4.  Tylenol 325 mg 2 tablets oral every 4 hours as needed.  5.  Xanax 0.5 mg oral 3 times a day as needed.   PHYSICAL EXAMINATION: VITAL SIGNS:  Temperature 97.6, blood pressure 74/48, pulse of 76, saturating 97% on room air.  GENERAL:  Obese Caucasian female patient lying in bed in no acute distress.  PSYCHIATRIC:  Alert and oriented x 3.  HEENT:  Atraumatic, normocephalic. Oral mucosa dry and pink. Pallor positive. No icterus. Pupils are equal and reactive to light.  NECK:  Supple, no thyromegaly or palpable lymph nodes.  CARDIOVASCULAR:  S1, S2, systolic murmur. Peripheral pulses 2+, 1+ edema.  RESPIRATORY:  Normal work of breathing. Clear to auscultation on both sides.  ABDOMEN:  Soft abdomen. Mild tenderness in the right upper quadrant area.  MUSCULOSKELETAL:  No joint swelling, redness, arthritis.  NEUROLOGICAL:  Motor strength 5/5 in upper extremities.   LABORATORY, DIAGNOSTIC, AND RADIOLOGICAL DATA:  Show sodium 141, potassium 3.5. AST, ALT, alkaline phosphatase normal. WBC of 14.4, hemoglobin 5.9, platelets of 258.   ASSESSMENT AND PLAN:  1.  GI bleed secondary to possible variceal bleed. The patient has had a recent EGD. Will need aggressive IV fluid resuscitation and a stat transfusion of at least 2 units of packed RBCs. We  will start the patient on Protonix drip, octreotide drip. I discussed the case with Dr. Candace Cruise of GI who was advised the patient be transferred to a tertiary care center with TIPS. I have communicated with Dr. Thomasene Lot, who will coordinate the transfer.   The patient is critically ill. Will be transferred to a tertiary care center care.  CRITICAL CARE TIME SPENT WAS: 40 minutes.  ____________________________ Leia Alf Debbrah Sampedro, MD srs:jm D: 03/28/2013 10:24:25 ET T: 03/28/2013 11:36:24 ET JOB#: [id number]  cc: Alveta Heimlich R. Sascha Baugher, MD,  <Dictator> Neita Carp MD ELECTRONICALLY SIGNED 03/30/2013 1:43

## 2014-08-20 NOTE — Consult Note (Signed)
Large blood per rectum prior to EGD. EGD showed large blood clot in fundus. There are large gastric varices in the cardia. Cannot tell which bled recently. No esophageal varices. One dose of reglan ordered to allow passage of blood clots. Keep patient NPO. Order abdominal U/S. INR elevated. LFT normal. Moniter closely. Continue both octreotide drip/protonix. If patient continues to bleed, have a low threshold for transfer to a tertiary center to consider TIPS, etc. Thanks.  Electronic Signatures: Verdie Shire (MD)  (Signed on 20-Nov-14 14:58)  Authored  Last Updated: 20-Nov-14 14:58 by Verdie Shire (MD)

## 2014-08-20 NOTE — Discharge Summary (Signed)
PATIENT NAME:  Tonya Dougherty, Tonya Dougherty MR#:  [id number] DATE OF BIRTH:  [birth year]  DATE OF ADMISSION:  03/19/2013 DATE OF DISCHARGE:  03/24/2013  PRIMARY CARE PHYSICIAN: Nonlocal   CONSULTATIONS: GI, Dr. Candace Cruise and Dr.  Gustavo Lah  FINAL DIAGNOSES: 1.  Circulatory shock.  2.  Acute blood loss anemia.  3.  Gastrointestinal bleeding with a large gastric varices.  4.  Cirrhosis 5.  Cholelithiasis.  6.  Elevated troponin, likely due to supply demand ischemia.   CONDITION: Stable.   CODE STATUS: FULL CODE.   HOME MEDICATIONS:  1.  Celexa 10 mg p.o. daily. 2.  Xanax 0.5 mg p.o. t.i.d.  3.  Tylenol 325 mg p.o. 2 tablets every 4 hours p.r.n.  4.  Nadolol 40 mg p.o. daily.  5.  Protonix 40 mg p.o. b.i.d.   DIET: Low fat, low cholesterol, low residue diet.   ACTIVITY: As tolerated.   FOLLOW-UP CARE: Follow up with PCP within 1 to 2 weeks. Follow up with Dr. Candace Cruise within 1 week.   REASON FOR ADMISSION: Almost passed out and coughed up some blood.   HOSPITAL COURSE: The patient is a 64 year old Caucasian female with a history of chronic hepatitis B, anxiety and depression who presents to the ED with 1 episode of dark tarry stool about 3 days ago and had some black vomiting 3 days before this admission. The patient had an acute onset of hemoptysis which was about 1/2 cup.  The patient felt dizzy, weak and was presyncopal.  The patient's blood pressure was 64/37 in the ED. She was treated with fluid resuscitation. The patient received fluids and blood pressure increased to 90s.  For a detailed history and physical examination, please refer to the admission note dictated by Dr. Bridgette Habermann. On admission date, laboratory data is as follows: Glucose 237, BUN 27, creatinine 1.03, sodium 142, potassium 3.3, bicarbonate 18, AST 74, ALT 28. WBC 12.6, hemoglobin 5.3, platelet 196, INR 1.5.   Acute gastrointestinal bleeding with acute blood loss anemia. The patient received 2 units blood transfusion and admitted to CCU.  She has been placed on octreotide drip and Protonix drip.  In addition, the patient received fluid resuscitation for hypotension. Dr. Candace Cruise evaluated the patient  and did an emergent EGD which showed type I isolated gastric varices located in the fundus with oozing blood. After endoscopy, Dr. Gustavo Lah suggested to continue octreotide and Protonix drip for 4 days. The patient was initially kept n.p.o. and then started clear liquids and full liquids.  The patient tolerated full liquids and changed to low residue diet today. The patient tolerated the diet for breakfast and lunch.  The patient denies any symptoms. No active bleeding. No melena or bloody stool. Hemoglobin has been stable about 7.8 and 7.5 after 3 units blood transfusion.  Dr. Candace Cruise suggested starting nadolol 40 mg p.o. daily since yesterday. In addition, he suggested to discontinue octreotide drip and Protonix and change to p.o. Protonix b.i.d.   The patient also has mild elevated troponin, which is likely due to demand ischemia due to hypotension.   Leukocytosis likely stress related, improved.   The patient also has hypokalemia which has improved.   Cirrhosis and cholelithiasis.  Got an ultrasound of abdomen which showed cholelithiasis but the patient has no abdominal pain, nausea or vomiting and hepatic cirrhosis.  The patient has no active bleeding after above mentioned treatment. She tolerated the diet well. Vital signs are stable. I discussed the patient's discharge plan with Dr. Candace Cruise. He agreed to  discharge the patient today. I discussed the patient's discharge plan with the patient, nurse and case manager.   TIME SPENT: About 40 minutes   ____________________________ Demetrios Loll, MD qc:dp D: 03/24/2013 14:57:04 ET T: 03/24/2013 15:18:26 ET JOB#: [id number]  cc: Demetrios Loll, MD, <Dictator> Demetrios Loll MD ELECTRONICALLY SIGNED 03/28/2013 11:02

## 2014-08-20 NOTE — H&P (Signed)
PATIENT NAME:  Tonya Dougherty, Tonya Dougherty MR#:  [id number] DATE OF BIRTH:  29-May-1950  DATE OF ADMISSION:  03/19/2013  REFERRING PHYSICIAN:  Dr. Marjean Donna.  PRIMARY CARE PHYSICIAN:  None.    CHIEF COMPLAINT:  Almost passed out and coughed up some blood.   HISTORY OF PRESENT ILLNESS:  The patient is an obese 64 year old female with a history of chronic hep B, anxiety, depression, who had an episode of dark tarry stool about three days ago and also did have some black vomiting about three days ago as well.  The patient denied having any further symptoms, nor any abdominal pain.  The patient had an acute onset of hematemesis.  She stated that she probably passed half a cup or so, but she is not sure.  The patient denied having any GI bleed in the past.  The patient felt weak, dizzy and was presyncopal.  EMS was called and the patient was brought in here.  The patient was lethargic, however responsive to verbal stimuli, but was cool, clammy.  The patient was noted to have some dried blood around the nares and her mouth.  She had her blood pressure checked which was initially 64/37.  The patient had emergency blood ordered and was given some fluids.  Hospitalist services were contacted for further evaluation and management.  Dr. Candace Cruise from GI has been notified.  Her last blood pressure currently is better in the 90s, in the low 601U systolic.   PAST MEDICAL HISTORY:  Chronic hep B, anxiety, depression, history of Bell's palsy on the right.   PAST SURGICAL HISTORY:  Denies.   ALLERGIES:  Denies.   FAMILY HISTORY:  Daughter with COPD, otherwise she is not sure.   SOCIAL HISTORY:  Smokes 1/2 pack a day.  She states she drank in her 39s, but last drank probably about 20 years ago, was never an excess of alcohol consumer.  Denies drugs.   OUTPATIENT MEDICATIONS:  Celexa 10 mg daily, Tylenol as needed, Xanax 0.5 mg 3 times a day.    REVIEW OF SYSTEMS:  CONSTITUTIONAL:  No fever, fatigue, weakness or weight  changes.  EYES:  Denies blurry vision, double vision.  EARS, NOSE, THROAT:  Denies tinnitus, hearing loss, snoring or postnasal drip.  RESPIRATORY:  No cough, wheezing,  COPD.  CARDIOVASCULAR:  Denies chest pain or swelling in the legs.  Denies hypertension.  The patient has palpitations when she is anxious.  GASTROINTESTINAL:  Positive for nausea, vomiting today and bloody stools as above.  The patient has no history of GI bleed per her.  No history of GERD or bloody stools in the past.  No abdominal pain.  GENITOURINARY:  Denies dysuria or hematuria.  HEMATOLOGIC AND LYMPHATIC:  Denies anemia or easy bruising.  SKIN:  No rashes.  MUSCULOSKELETAL:  Denies arthritis or gout.  NEUROLOGIC:  Denies focal weakness or numbness, stroke or seizures.  PSYCHIATRIC:  Has anxiety and depression.   PHYSICAL EXAMINATION: VITAL SIGNS:  On arrival temperature was 96.7, pulse rate 96, respiratory rate 24, blood pressure 64/37, O2 sat 94% on oxygen.  Last blood pressure noted 98/36.  GENERAL:  The patient is an obese female lying in bed, pale, otherwise no obvious distress.  HEENT:  Normocephalic, atraumatic.  The patient has the right pupil is slightly larger than the left, probably about 6 to 7 mm, both reactive.  Extraocular muscles intact.  Dry mucous membranes with some dried blood in the nares, around the lips and in the lower  face.  NECK:  Supple.  No thyroid tenderness.  No cervical lymphadenopathy.  CARDIOVASCULAR:  S1, S2, tachycardic.  No significant murmurs, rubs or gallops.  LUNGS:  Clear to auscultation without wheezing, rhonchi or rales.  ABDOMEN:  Soft, hyperactive bowel sounds all quadrants.  No significant tenderness.  Cannot appreciate any organomegaly secondary to obesity.  SKIN:  No obvious rashes.  EXTREMITIES:  No significant lower extremity edema.  NEUROLOGIC:  The patient does have a right-sided mild facial droop from her Bell's palsy.  Otherwise cranial nerves are intact II through  XII.  Strength is 5 out of 5 in all extremities.  Sensation is intact to light touch.  PSYCHIATRIC:  Awake, alert, oriented x 3.  Cooperative.   LABORATORIES AND IMAGING STUDIES:  Glucose 237, BUN 27, creatinine 1.03, sodium 142, potassium 3.3, serum CO2 18.  LFTs showed albumin of 2.1, AST 74, ALT 28, total protein 4.7, white count of 12.6, hemoglobin 5.3, platelets are 196, INR 1.5.   ASSESSMENT AND PLAN:  We have a 64 year old with chronic hepatitis B, anxiety, depression, who denies having any history of gastrointestinal bleed in the past who comes in with shock, likely posthemorrhagic from acute gastrointestinal bleed which I suspect is upper, possibly variceal in nature.  The patient does have severe symptomatic anemia as well.  At this point we will admit the patient to the CCU.  The patient has received 2 units of emergency blood and the patient has been consented by the ER physician.  Would monitor the hemoglobin and obtain a gastroenterology consult.  The patient has been started on octreotide and Protonix drips.  Would make the patient nothing by mouth.  Check hemoglobin q. 8 hours.  I would also start her on standing Zofran to prevent hematemesis and further aggravation of her bleeding in case this is variceal.  The patient is a high risk of rebleeding.  Her shock has improved with fluid resuscitation.  We will keep the normal saline going.  I would consider pressors if blood pressures trend down.  Would follow another hemoglobin soon and give her more blood as needed.  The patient does have elevated INR and is mildly coagulopathic, possibly from her liver disease.  Would give some vitamin K, but do not know if it will do much for her, but if she has ongoing bleeding would consider fresh frozen plasma transfusion.  I would go ahead and also check an ultrasound of the liver.  The patient would also have her iron checked.  I would go ahead and replete potassium at this time.  Would monitor her  carefully in the CCU.  The patient does have some hyperglycemia and I will go ahead and check a hemoglobin A1c as well.  The patient does smoke and she was counseled for three minutes about her smoking.  Go ahead and start her on a patch for now.  For DVT prophylaxis she will be on SCDs and TEDs.   Total critical care time spent is 55 minutes.   CODE STATUS:  THE PATIENT IS A FULL CODE.   ____________________________ Vivien Presto, MD sa:ea D: 03/19/2013 02:32:21 ET T: 03/19/2013 03:07:18 ET JOB#: [id number]  cc: Vivien Presto, MD, <Dictator> Vivien Presto MD ELECTRONICALLY SIGNED 04/06/2013 20:01

## 2014-08-20 NOTE — Consult Note (Signed)
Pt seen and examined. Full consult to follow. Hematemesis starting Sun night and hematochezia since Monday. Hypotensive with hgb of 1.5. 1/2 PPD. No alcohol. 2 units of unmatched blood given in ER. Feels much better. Has been taking BC powders for headache/bursitis. Protonix/octreotide given. Will schedule EGD later today. Thanks.  Electronic Signatures: Verdie Shire (MD)  (Signed on 20-Nov-14 07:55)  Authored  Last Updated: 20-Nov-14 07:55 by Verdie Shire (MD)

## 2014-08-20 NOTE — Consult Note (Signed)
Chief Complaint:  Subjective/Chief Complaint Doing well. No abd pain. No bleeding. Tolerating clears.   VITAL SIGNS/ANCILLARY NOTES: **Vital Signs.:   24-Nov-14 10:26  Vital Signs Type Q 4hr  Temperature Temperature (F) 98.1  Celsius 36.7  Temperature Source oral  Pulse Pulse 64  Respirations Respirations 17  Systolic BP Systolic BP 883  Diastolic BP (mmHg) Diastolic BP (mmHg) 69  Mean BP 85  Pulse Ox % Pulse Ox % 97  Pulse Ox Activity Level  At rest  Oxygen Delivery 2L   Brief Assessment:  GEN no acute distress   Cardiac Regular   Respiratory clear BS   Gastrointestinal Normal   Lab Results:  Routine Hem:  24-Nov-14 04:42   WBC (CBC)  11.9  RBC (CBC)  2.71  Hemoglobin (CBC)  7.7  Hematocrit (CBC)  23.8  Platelet Count (CBC) 177  MCV 88  MCH 28.3  MCHC 32.2  RDW  18.0  Neutrophil % 58.3  Lymphocyte % 27.7  Monocyte % 11.8  Eosinophil % 1.4  Basophil % 0.8  Neutrophil #  6.9  Lymphocyte # 3.3  Monocyte #  1.4  Eosinophil # 0.2  Basophil # 0.1 (Result(s) reported on 23 Mar 2013 at 05:20AM.)   Assessment/Plan:  Assessment/Plan:  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 Stable now.   Plan Advance to full liquids. Can try nadolol 40mg  tonight and see if HR/BP ok. Taper off octreotide drip by tomorrow. Switch to protonix po bid today. If stable overnight, can try low residue diet tomorrow and discharge by Wed if remains stable. I will be in [employer] and [employer] on Wed. Call me if questions. Thanks.   Electronic Signatures: Verdie Shire (MD)  (Signed [phone] 12:46)  Authored: Chief Complaint, VITAL SIGNS/ANCILLARY NOTES, Brief Assessment, Lab Results, Assessment/Plan   Last Updated: 24-Nov-14 12:46 by Verdie Shire (MD)

## 2014-08-20 NOTE — Consult Note (Signed)
Chief Complaint:  Subjective/Chief Complaint seen for hematochezia, hematemesis.  stable no further evidence of ongoing bleeding, on iv octreotide and protonix, on ice chips   VITAL SIGNS/ANCILLARY NOTES: **Vital Signs.:   22-Nov-14 13:52  Vital Signs Type Routine  Temperature Temperature (F) 97.6  Celsius 36.4  Temperature Source oral  Pulse Pulse 83  Respirations Respirations 18  Systolic BP Systolic BP 629  Diastolic BP (mmHg) Diastolic BP (mmHg) 73  Mean BP 90  Pulse Ox % Pulse Ox % 93  Oxygen Delivery 3L   Brief Assessment:  Cardiac Regular   Respiratory clear BS   Gastrointestinal details normal Soft  Nontender  Nondistended  No masses palpable   Lab Results: Hepatic:  22-Nov-14 03:13   Bilirubin, Total 0.7  Alkaline Phosphatase 87 (45-117 NOTE: New Reference Range 03/20/13)  SGPT (ALT)  97  SGOT (AST)  150  Total Protein, Serum  5.4  Albumin, Serum  2.3  Routine Chem:  20-Nov-14 00:54   BUN  27  21-Nov-14 05:04   BUN  25  22-Nov-14 03:13   Glucose, Serum  126  BUN  20  Creatinine (comp) 0.73  Sodium, Serum 142  Potassium, Serum 3.7  Chloride, Serum  114  CO2, Serum 26  Calcium (Total), Serum  7.5  Osmolality (calc) 287  eGFR (African American) >60  eGFR (Non-African American) >60 (eGFR values <24m/min/1.73 m2 may be an indication of chronic kidney disease (CKD). Calculated eGFR is useful in patients with stable renal function. The eGFR calculation will not be reliable in acutely ill patients when serum creatinine is changing rapidly. It is not useful in  patients on dialysis. The eGFR calculation may not be applicable to patients at the low and high extremes of body sizes, pregnant women, and vegetarians.)  Anion Gap  2  Routine Hem:  20-Nov-14 00:54   Hemoglobin (CBC)  5.3    04:39   Hemoglobin (CBC)  7.9    07:32   Hemoglobin (CBC)  7.7 (Result(s) reported on 19 Mar 2013 at 07:46AM.)    18:28   Hemoglobin (CBC)  7.9 (Result(s) reported  on 19 Mar 2013 at 07:04PM.)  21-Nov-14 05:04   Hemoglobin (CBC)  6.8    19:53   Hemoglobin (CBC)  8.2 (Result(s) reported on 20 Mar 2013 at 08:11PM.)  22-Nov-14 03:13   WBC (CBC)  11.8  RBC (CBC)  2.74  Hemoglobin (CBC)  7.8  Hematocrit (CBC)  23.9  Platelet Count (CBC) 168  MCV 87  MCH 28.4  MCHC 32.5  RDW  18.3  Neutrophil % 64.2  Lymphocyte % 26.0  Monocyte % 8.7  Eosinophil % 0.5  Basophil % 0.6  Neutrophil #  7.5  Lymphocyte # 3.1  Monocyte #  1.0  Eosinophil # 0.1  Basophil # 0.1 (Result(s) reported on 21 Mar 2013 at 04:17AM.)   Radiology Results: [place]    20-Nov-14 10:12, [place]  [place]   REASON FOR EXAM:    elevated lfts  COMMENTS:   Body Site: GB and Fossa, CBD; Liver    PROCEDURE: [place] - [place]  - Mar 19 2013 10:12AM     CLINICAL DATA:  Elevated liver function tests.    EXAM:  [place] - RIGHT UPPER QUADRANT    COMPARISON:  None.    FINDINGS:  Gallbladder  Multiple gallstones and sludge are present. Gallbladder wall is  measured at 6 mm consistent with moderate thickening. No  sonographic  Murphy's sign is noted.    Common bile duct    Diameter: Measures 4.9 mm which is within normal limits.    Liver:    No focal abnormality is noted. Coarse echotexture of hepatic  parenchyma is noted with possible nodular contour suggesting hepatic  cirrhosis or other diffuse hepatocellular disease.     IMPRESSION:  Cholelithiasis is noted with associated gallbladder wall thickening;  cholecystitis cannot be excluded and HIDA scan may be performed for  further evaluation. Coarse echotexture of hepatic parenchyma is  noted with possible nodular contour suggesting hepatic cirrhosis or  other diffuse hepatocellular disease.      Electronically Signed    By: Sabino Dick M.D.    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  stable on octreotide, iv ppi. US showing probable cirrhosis in the setting of chronic hepatitis B.  Recent powdered ASA use.   Plan 1) continue current octreotide drip for 4 days, then change dose to 25 mcg/hr (half of current) for an additional 24 hours.  Continue iv ppi.  If there is rebleeding associated with the varices, would likely need transfer to institution where TIPSS can be done for treatment of gastric varices. Add nadolol daily 20 mg at 5 pm po.  increase to attain 15%decrease of resting heart rate as clinically feasible. following. may need further liver evaluation as outpatient. will trial clears , no red or carbonation. do not advance until ok by GI.   Electronic Signatures: Loistine Simas (MD)  (Signed [phone] 16:18)  Authored: Chief Complaint, VITAL SIGNS/ANCILLARY NOTES, Brief Assessment, Lab Results, Radiology Results, Assessment/Plan   Last Updated: 22-Nov-14 16:18 by Loistine Simas (MD)

## 2014-08-20 NOTE — Consult Note (Signed)
PATIENT NAME:  Tonya Dougherty, Tonya Dougherty MR#:  [id number] DATE OF BIRTH:  April 10, 1951  DATE OF ADMISSION: 03/19/2013   DATE OF CONSULTATION:  03/19/2013  CONSULTING PHYSICIAN:  Lupita Dawn. Kiffany Schelling, MD  REASON FOR REFERRAL: Acute gastrointestinal bleeding, hematemesis, hematochezia.   DESCRIPTION: The patient is a 64 year old white female with a known history of chronic hepatitis B, anxiety and depression, who had an episode of hematemesis about 3 or 4 nights ago. Then, the following day had 1 episode of dark tarry stool. She did not really have any abdominal pain or nausea or vomiting associated with this. The reason she came to the Emergency Room was because she was feeling rather dizzy and weak and lightheaded. When she was brought into the Emergency Room, she was rather lethargic, although did respond to verbal stimuli. She was cool and clammy, and blood pressure initially was only 64/37. The patient was given emergency uncrossed blood of 2 units immediately. The patient was given fluids. Then, she was brought into ICU for further evaluation. This morning, she is feeling much better. She feels stronger today after blood transfusion.   The patient admitted to taking [facility] Powder recently for headaches and also history of bursitis. The patient denied any prior history of ulcers in the past.   PAST MEDICAL HISTORY: Notable for history of chronic hepatitis B, anxiety, depression, and history of Bell's palsy.   PAST SURGICAL HISTORY: There is no surgical history.   ALLERGIES: There are no allergies to medications.   FAMILY HISTORY: Notable for COPD.  SOCIAL HISTORY: Smoking history: She still smokes 1/2-pack per day. She has not had any drink in many years.   MEDICATIONS AT HOME: Include Celexa 10 mg, Xanax 0.5 mg as needed several times a day as well as BC Powder.   REVIEW OF SYMPTOMS:  CONSTITUTIONAL: There is some weakness and lightheadedness, but no fevers or chills. There are no weight changes.  HEENT: There  are no visual or hearing changes.  CARDIOPULMONARY: There is no coughing or shortness of breath or chest pain or palpitations.  GASTROINTESTINAL: Symptoms have been described already with nausea and hematemesis, but no abdominal pain or cramping.  The rest of the review of symptoms is negative.   PHYSICAL EXAMINATION:  GENERAL: The patient is in no acute distress right now. VITAL SIGNS: Her blood pressure is actually stable at this point. She is afebrile.  HEAD AND NECK: Showed normocephalic, atraumatic head. Pupils are equally reactive. Throat was clear. Neck was supple.  CARDIAC: Revealed regular rhythm and rate without any murmurs.  LUNGS: Clear bilaterally.  ABDOMEN: Showed normoactive bowel sounds. Soft and nontender throughout. There is no hepatomegaly. She has active bowel sounds.  EXTREMITIES: Showed no clubbing, cyanosis or edema. There is a slight facial droop from prior palsy. Otherwise, everything else is negative.   LABORATORY DATA: Initial sodium was 142, potassium 3.3, chloride 112, BUN 27, creatinine is 1.03, ferritin level is 10. Liver enzymes are normal. Troponin level is 0.06, hemoglobin is only 5.3, with a white count of 12.6. After 2 units this morning, her hemoglobin is 7.7. INR is 1.5. EKG shows some normal sinus rhythm, but there is some prolonged QT.   IMPRESSION AND PLAN: This is a patient with acute gastrointestinal bleeding. Likely is the upper gastrointestinal because she did have a bout of hematemesis. She has been taking BC Powder. The patient is already on Protonix and octreotide drip. Will need to continue to monitor her hemoglobin. Will schedule an upper endoscopy later  today to find the source of bleeding. If there are any bleeding ulcers, then these areas will be cauterized.   Thank you for the referral.   ____________________________ Lupita Dawn. Candace Cruise, MD pyo:lb D: 03/19/2013 13:58:15 ET T: 03/19/2013 14:53:29 ET JOB#: [id number]  cc: Lupita Dawn. Candace Cruise, MD,  <Dictator> Lupita Dawn Latasia Silberstein MD ELECTRONICALLY SIGNED 03/23/2013 13:24

## 2014-08-20 NOTE — Consult Note (Signed)
Chief Complaint:  Subjective/Chief Complaint seen for upper gi bleeding.  hemodynalmically stable, no evidence of recurrent bleeding.  tolerating clears.  no n/c or abdominal pain.   VITAL SIGNS/ANCILLARY NOTES: **Vital Signs.:   23-Nov-14 14:30  Vital Signs Type Routine  Celsius 36.9  Temperature Source oral  Pulse Pulse 64  Respirations Respirations 18  Systolic BP Systolic BP 409  Diastolic BP (mmHg) Diastolic BP (mmHg) 64  Mean BP 79  Pulse Ox % Pulse Ox % 98  Pulse Ox Activity Level  At rest  Oxygen Delivery 2L   Brief Assessment:  Cardiac Regular   Respiratory clear BS   Gastrointestinal details normal Soft  Nontender  Nondistended  No masses palpable  Bowel sounds normal   Lab Results: Hepatic:  22-Nov-14 03:13   Bilirubin, Total 0.7  Alkaline Phosphatase 87 (45-117 NOTE: New Reference Range 03/20/13)  SGPT (ALT)  97  SGOT (AST)  150  Total Protein, Serum  5.4  Albumin, Serum  2.3  Routine Chem:  20-Nov-14 00:54   BUN  27  21-Nov-14 05:04   BUN  25  22-Nov-14 03:13   BUN  20  Routine Coag:  21-Nov-14 05:04   INR 1.3 (INR reference interval applies to patients on anticoagulant therapy. A single INR therapeutic range for coumarins is not optimal for all indications; however, the suggested range for most indications is 2.0 - 3.0. Exceptions to the INR Reference Range may include: Prosthetic heart valves, acute myocardial infarction, prevention of myocardial infarction, and combinations of aspirin and anticoagulant. The need for a higher or lower target INR must be assessed individually. Reference: The Pharmacology and Management of the Vitamin K  antagonists: the seventh ACCP Conference on Antithrombotic and Thrombolytic Therapy. WJXBJ.4782 Sept:126 (3suppl): [id number]. A HCT value >55% may artifactually increase the PT.  In one study,  the increase was an average of 25%. Reference:  "Effect on Routine and Special Coagulation Testing Values of Citrate  Anticoagulant Adjustment in Patients with High HCT Values." American Journal of Clinical Pathology 2006;126:400-405.)  Routine Hem:  20-Nov-14 00:54   Hemoglobin (CBC)  5.3  Platelet Count (CBC) 196 (Result(s) reported on 19 Mar 2013 at 01:39AM.)    04:39   Hemoglobin (CBC)  7.9  Platelet Count (CBC) 173    07:32   Hemoglobin (CBC)  7.7 (Result(s) reported on 19 Mar 2013 at 07:46AM.)    18:28   Hemoglobin (CBC)  7.9 (Result(s) reported on 19 Mar 2013 at 07:04PM.)  21-Nov-14 05:04   Hemoglobin (CBC)  6.8  Platelet Count (CBC) 178    19:53   Hemoglobin (CBC)  8.2 (Result(s) reported on 20 Mar 2013 at 08:11PM.)  22-Nov-14 03:13   Hemoglobin (CBC)  7.8  Platelet Count (CBC) 168  23-Nov-14 06:19   WBC (CBC)  14.0  RBC (CBC)  2.66  Hemoglobin (CBC)  7.5  Hematocrit (CBC)  23.1  Platelet Count (CBC) 173  MCV 87  MCH 28.2  MCHC 32.5  RDW  18.0  Neutrophil % 70.3  Lymphocyte % 18.2  Monocyte % 9.7  Eosinophil % 0.9  Basophil % 0.9  Neutrophil #  9.8  Lymphocyte # 2.5  Monocyte #  1.4  Eosinophil # 0.1  Basophil # 0.1 (Result(s) reported on 22 Mar 2013 at 06:46AM.)   Radiology Results: [place]:    20-Nov-14 10:12, [place] Abdomen Limited Survey  US Abdomen Limited Survey   REASON FOR EXAM:    elevated lfts  COMMENTS:   Body Site: GB and  Fossa, CBD; Liver    PROCEDURE: [place]  - [place] ABDOMEN LIMITED SURVEY  - Mar 19 2013 10:12AM     CLINICAL DATA:  Elevated liver function tests.    EXAM:  US ABDOMEN LIMITED - RIGHT UPPER QUADRANT    COMPARISON:  None.    FINDINGS:  Gallbladder  Multiple gallstones and sludge are present. Gallbladder wall is  measured at 6 mm consistent with moderate thickening. No sonographic  Murphy's sign is noted.    Common bile duct    Diameter: Measures 4.9 mm which is within normal limits.    Liver:    No focal abnormality is noted. Coarse echotexture of hepatic  parenchyma is noted with possible nodular contour suggesting hepatic  cirrhosis or  other diffuse hepatocellular disease.     IMPRESSION:  Cholelithiasis is noted with associated gallbladder wall thickening;  cholecystitis cannot be excluded and HIDA scan may be performed for  further evaluation. Coarse echotexture of hepatic parenchyma is  noted with possible nodular contour suggesting hepatic cirrhosis or  other diffuse hepatocellular disease.      Electronically Signed    By: Sabino Dick M.D.    On: 03/19/2013 10:15         Verified By: Marveen Reeks, M.D.,   Assessment/Plan:  Assessment/Plan:  Assessment 1) hematemesis, melena-finding on egd c/w gastric varices.  stable on iv octreotide, protonix. tolerating clears. 2) chronic hepatitis B, cirrhosis   Plan 1)  continue iv octreotide for a course of 4 days at 50 mcg per hour drip, then one day of 25 mcg per hour drip, then d/c.   2) change iv ppi drip to bid iv tomorrow in anticipation of bid po the following. day. 3) continue po nadolol 20 mg /q 5pm.  adequate reduction of pulse and bp seems good currently.  4) will need GI o/p fu re chronic hepatitis B.  Dr Candace Cruise to return tomorrow.   Electronic Signatures: Loistine Simas (MD)  (Signed [phone] 20:18)  Authored: Chief Complaint, VITAL SIGNS/ANCILLARY NOTES, Brief Assessment, Lab Results, Radiology Results, Assessment/Plan   Last Updated: 23-Nov-14 20:18 by Loistine Simas (MD)

## 2016-02-02 ENCOUNTER — Encounter (HOSPITAL_COMMUNITY): Payer: Self-pay

## 2016-02-02 ENCOUNTER — Emergency Department (HOSPITAL_COMMUNITY)
Admission: EM | Admit: 2016-02-02 | Discharge: 2016-02-03 | Disposition: A | Discharge status: Home / Self Care | Payer: Medicare Other | Attending: Emergency Medicine | Admitting: Emergency Medicine

## 2016-02-02 DIAGNOSIS — I10 Essential (primary) hypertension: Secondary | ICD-10-CM | POA: Insufficient documentation

## 2016-02-02 DIAGNOSIS — E119 Type 2 diabetes mellitus without complications: Secondary | ICD-10-CM | POA: Diagnosis not present

## 2016-02-02 DIAGNOSIS — T7840XA Allergy, unspecified, initial encounter: Secondary | ICD-10-CM

## 2016-02-02 DIAGNOSIS — L500 Allergic urticaria: Secondary | ICD-10-CM | POA: Insufficient documentation

## 2016-02-02 DIAGNOSIS — R220 Localized swelling, mass and lump, head: Secondary | ICD-10-CM | POA: Diagnosis present

## 2016-02-02 HISTORY — DX: Type 2 diabetes mellitus without complications: E11.9

## 2016-02-02 HISTORY — DX: Essential (primary) hypertension: I10

## 2016-02-02 HISTORY — DX: Major depressive disorder, single episode, unspecified: F32.9

## 2016-02-02 NOTE — ED Triage Notes (Addendum)
Pt states that she started having redness on her ear about two days ago, today she woke up and whole R side of her face is red with mild swelling. Pt states that she has a lot of allergies. Denies SOB, PTA pt states she took three benadryl and her BP medication. Skin is warm to touch

## 2016-02-03 DIAGNOSIS — L500 Allergic urticaria: Secondary | ICD-10-CM | POA: Diagnosis not present

## 2016-02-03 MED ORDER — DIPHENHYDRAMINE HCL 25 MG PO TABS: 25.0000 mg | ORAL_TABLET | Freq: Three times a day (TID) | ORAL | 0 refills | Status: DC

## 2016-02-03 MED ORDER — FAMOTIDINE 20 MG PO TABS: 20.0000 mg | ORAL_TABLET | Freq: Two times a day (BID) | ORAL | 0 refills | Status: DC

## 2016-02-03 MED ORDER — HYDROXYZINE HCL 10 MG PO TABS: 5.0000 mg | ORAL_TABLET | Freq: Three times a day (TID) | ORAL | 0 refills | Status: DC | PRN

## 2016-02-03 MED ORDER — HYDROXYZINE HCL 10 MG PO TABS
10.0000 mg | ORAL_TABLET | Freq: Once | ORAL | Status: AC
  Administered 2016-02-03: 10 mg via ORAL
  Filled 2016-02-03: qty 1

## 2016-02-03 MED ORDER — PREDNISONE 20 MG PO TABS: 40.0000 mg | ORAL_TABLET | Freq: Every day | ORAL | 0 refills | Status: DC

## 2016-02-03 MED ORDER — PREDNISONE 20 MG PO TABS
60.0000 mg | ORAL_TABLET | ORAL | Status: AC
  Administered 2016-02-03: 60 mg via ORAL
  Filled 2016-02-03: qty 3

## 2016-02-03 NOTE — Discharge Instructions (Signed)
As discussed, it is important that you monitor your condition carefully, take all medication as directed, and do not hesitate to return here for concerning changes in your condition.

## 2016-02-03 NOTE — ED Notes (Signed)
Discharge instructions and prescriptions reviewed - voiced understanding 

## 2016-02-03 NOTE — ED Notes (Signed)
Patient presents with her right ear and the right side of her face swollen.  Stated she is allergic to anything with perfume.  Wiped her dog off with a perfume smelling wipe and then the dog sat on her chest and rested her face against the patient's right ear and right side of face.  Denies SOB, difficulty swallowing, etc

## 2016-02-03 NOTE — ED Provider Notes (Signed)
[facility] DEPT Provider Note   CSN: [id number] Arrival date & time: 02/02/16  2205     History   Chief Complaint Chief Complaint  Patient presents with  . Allergic Reaction    HPI Tonya Dougherty is a 65 y.o. female.  HPI patient presents with concern of facial swelling. Symptoms began 2 days ago, after she tried on an earring. Initially there swelling, erythema about the patient's right ear.  Subsequently she has developed erythema throughout the right side of her face, and right lateral neck. No difficulty swallowing, speaking, breathing, no fever, chills, no pain. There is itchiness about the lesion. She notes history of metal allergy, with prior similar events in the past per Just prior to ED arrival she took Benadryl, 2.   Past Medical History:  Diagnosis Date  . Depression   . Diabetes mellitus without complication ([place])   . Hypertension     There are no active problems to display for this patient.   Past Surgical History:  Procedure Laterality Date  . ANKLE SURGERY      OB History    No data available       Home Medications    Prior to Admission medications   Medication Sig Start Date End Date Taking? Authorizing Provider  diphenhydrAMINE (BENADRYL) 25 MG tablet Take 1 tablet (25 mg total) by mouth 3 (three) times daily. Take one tablet three times daily for two days 02/03/16   Carmin Muskrat, MD  famotidine (PEPCID) 20 MG tablet Take 1 tablet (20 mg total) by mouth 2 (two) times daily. Take one tablet twice daily for two days 02/03/16 02/06/16  Carmin Muskrat, MD  hydrOXYzine (ATARAX/VISTARIL) 10 MG tablet Take 0.5 tablets (5 mg total) by mouth every 8 (eight) hours as needed for itching. 02/03/16   Carmin Muskrat, MD  predniSONE (DELTASONE) 20 MG tablet Take 2 tablets (40 mg total) by mouth daily with breakfast. For the next four days 02/03/16   Carmin Muskrat, MD    Family History No family history on file.  Social History Social History    Substance Use Topics  . Smoking status: Never Smoker  . Smokeless tobacco: Never Used  . Alcohol use No     Allergies   Review of patient's allergies indicates no known allergies.   Review of Systems Review of Systems  Constitutional:       Per HPI, otherwise negative  HENT:       Per HPI, otherwise negative  Respiratory:       Per HPI, otherwise negative  Cardiovascular:       Per HPI, otherwise negative  Gastrointestinal: Negative for vomiting.  Endocrine:       Negative aside from HPI  Genitourinary:       Neg aside from HPI   Musculoskeletal:       Per HPI, otherwise negative  Skin: Positive for rash.  Neurological: Negative for syncope.     Physical Exam Updated Vital Signs BP 198/96 (BP Location: Left Arm)   Pulse 76   Temp 97.3 F (36.3 C) (Oral)   Resp 18   Ht 5\' 4"  (1.626 m)   Wt 250 lb (113.4 kg)   SpO2 99%   BMI 42.91 kg/m   Physical Exam  Constitutional: She is oriented to person, place, and time. She appears well-developed and well-nourished. No distress.  HENT:  Ears:  Mouth/Throat: Uvula is midline, oropharynx is clear and moist and mucous membranes are normal.  Swelling throughout the right  face, right ear, and erythema.  Otherwise atraumatic  Eyes: Conjunctivae and EOM are normal.  Cardiovascular: Normal rate and regular rhythm.   Pulmonary/Chest: Effort normal and breath sounds normal. No stridor. No respiratory distress.  Abdominal: She exhibits no distension.  Musculoskeletal: She exhibits no edema.  Neurological: She is alert and oriented to person, place, and time. No cranial nerve deficit. She exhibits normal muscle tone. Coordination normal.  Skin: Skin is warm and dry. Rash noted.  Urticarial, erythematous changes throughout the right side of the face, right ear  Psychiatric: She has a normal mood and affect.  Nursing note and vitals reviewed.    ED Treatments / Results   Procedures Procedures (including critical care  time)  Medications Ordered in ED Medications  predniSONE (DELTASONE) tablet 60 mg (not administered)  hydrOXYzine (ATARAX/VISTARIL) tablet 10 mg (not administered)     Initial Impression / Assessment and Plan / ED Course  I have reviewed the triage vital signs and the nursing notes.  Pertinent labs & imaging results that were available during my care of the patient were reviewed by me and considered in my medical decision making (see chart for details).  Clinical Course    Patient presents with ongoing erythema about the right face, no pain, no fever, low suspicion for atypical infection. However, the patient does have evidence for substantial allergic reaction. Patient started on multiple antihistamines, steroids, medication for itch relief. With no evidence for oral pharyngeal lesions, patient discharged in stable condition with close outpatient follow-up.  Final Clinical Impressions(s) / ED Diagnoses   Final diagnoses:  Allergic reaction, initial encounter    New Prescriptions New Prescriptions   DIPHENHYDRAMINE (BENADRYL) 25 MG TABLET    Take 1 tablet (25 mg total) by mouth 3 (three) times daily. Take one tablet three times daily for two days   FAMOTIDINE (PEPCID) 20 MG TABLET    Take 1 tablet (20 mg total) by mouth 2 (two) times daily. Take one tablet twice daily for two days   HYDROXYZINE (ATARAX/VISTARIL) 10 MG TABLET    Take 0.5 tablets (5 mg total) by mouth every 8 (eight) hours as needed for itching.   PREDNISONE (DELTASONE) 20 MG TABLET    Take 2 tablets (40 mg total) by mouth daily with breakfast. For the next four days     Carmin Muskrat, MD 02/03/16 [phone]

## 2016-09-11 DIAGNOSIS — F331 Major depressive disorder, recurrent, moderate: Secondary | ICD-10-CM | POA: Diagnosis present

## 2017-10-22 ENCOUNTER — Other Ambulatory Visit: Payer: Self-pay

## 2017-10-22 ENCOUNTER — Emergency Department (HOSPITAL_COMMUNITY): Payer: Medicare Other

## 2017-10-22 ENCOUNTER — Encounter (HOSPITAL_COMMUNITY): Payer: Self-pay

## 2017-10-22 ENCOUNTER — Inpatient Hospital Stay (HOSPITAL_COMMUNITY)
Admission: EM | Admit: 2017-10-22 | Discharge: 2017-10-24 | DRG: 300 | Disposition: A | Discharge status: Home / Self Care | Payer: Medicare Other | Attending: Vascular Surgery | Admitting: Vascular Surgery

## 2017-10-22 DIAGNOSIS — F329 Major depressive disorder, single episode, unspecified: Secondary | ICD-10-CM | POA: Diagnosis present

## 2017-10-22 DIAGNOSIS — Z6841 Body Mass Index (BMI) 40.0 and over, adult: Secondary | ICD-10-CM

## 2017-10-22 DIAGNOSIS — I871 Compression of vein: Secondary | ICD-10-CM | POA: Diagnosis present

## 2017-10-22 DIAGNOSIS — I10 Essential (primary) hypertension: Secondary | ICD-10-CM | POA: Diagnosis present

## 2017-10-22 DIAGNOSIS — Z794 Long term (current) use of insulin: Secondary | ICD-10-CM | POA: Diagnosis not present

## 2017-10-22 DIAGNOSIS — I82422 Acute embolism and thrombosis of left iliac vein: Secondary | ICD-10-CM | POA: Diagnosis present

## 2017-10-22 DIAGNOSIS — M7989 Other specified soft tissue disorders: Secondary | ICD-10-CM

## 2017-10-22 DIAGNOSIS — E1142 Type 2 diabetes mellitus with diabetic polyneuropathy: Secondary | ICD-10-CM | POA: Diagnosis present

## 2017-10-22 DIAGNOSIS — I82409 Acute embolism and thrombosis of unspecified deep veins of unspecified lower extremity: Secondary | ICD-10-CM | POA: Diagnosis present

## 2017-10-22 DIAGNOSIS — F17210 Nicotine dependence, cigarettes, uncomplicated: Secondary | ICD-10-CM | POA: Diagnosis present

## 2017-10-22 DIAGNOSIS — I82812 Embolism and thrombosis of superficial veins of left lower extremities: Secondary | ICD-10-CM

## 2017-10-22 DIAGNOSIS — Z79899 Other long term (current) drug therapy: Secondary | ICD-10-CM | POA: Diagnosis not present

## 2017-10-22 DIAGNOSIS — I82432 Acute embolism and thrombosis of left popliteal vein: Secondary | ICD-10-CM | POA: Diagnosis present

## 2017-10-22 DIAGNOSIS — I82412 Acute embolism and thrombosis of left femoral vein: Secondary | ICD-10-CM | POA: Diagnosis present

## 2017-10-22 DIAGNOSIS — E119 Type 2 diabetes mellitus without complications: Secondary | ICD-10-CM

## 2017-10-22 DIAGNOSIS — M1990 Unspecified osteoarthritis, unspecified site: Secondary | ICD-10-CM | POA: Diagnosis present

## 2017-10-22 DIAGNOSIS — E1165 Type 2 diabetes mellitus with hyperglycemia: Secondary | ICD-10-CM | POA: Diagnosis present

## 2017-10-22 HISTORY — DX: Unspecified osteoarthritis, unspecified site: M19.90

## 2017-10-22 LAB — CBC WITH DIFFERENTIAL/PLATELET
Abs Immature Granulocytes: 0 10*3/uL (ref 0.0–0.1)
BASOS PCT: 1 %
Basophils Absolute: 0.1 10*3/uL (ref 0.0–0.1)
EOS PCT: 2 %
Eosinophils Absolute: 0.1 10*3/uL (ref 0.0–0.7)
HCT: 42.2 % (ref 36.0–46.0)
HEMOGLOBIN: 13.1 g/dL (ref 12.0–15.0)
Immature Granulocytes: 0 %
LYMPHS PCT: 21 %
Lymphs Abs: 1.8 10*3/uL (ref 0.7–4.0)
MCH: 27.6 pg (ref 26.0–34.0)
MCHC: 31 g/dL (ref 30.0–36.0)
MCV: 88.8 fL (ref 78.0–100.0)
MONO ABS: 0.8 10*3/uL (ref 0.1–1.0)
MONOS PCT: 9 %
NEUTROS ABS: 5.8 10*3/uL (ref 1.7–7.7)
Neutrophils Relative %: 67 %
Platelets: 145 10*3/uL — ABNORMAL LOW (ref 150–400)
RBC: 4.75 MIL/uL (ref 3.87–5.11)
RDW: 16.7 % — ABNORMAL HIGH (ref 11.5–15.5)
WBC: 8.5 10*3/uL (ref 4.0–10.5)

## 2017-10-22 LAB — PROTIME-INR
INR: 1.11
PROTHROMBIN TIME: 14.2 s (ref 11.4–15.2)

## 2017-10-22 LAB — BASIC METABOLIC PANEL
ANION GAP: 9 (ref 5–15)
BUN: 15 mg/dL (ref 8–23)
CALCIUM: 9 mg/dL (ref 8.9–10.3)
CO2: 24 mmol/L (ref 22–32)
Chloride: 102 mmol/L (ref 98–111)
Creatinine, Ser: 1.15 mg/dL — ABNORMAL HIGH (ref 0.44–1.00)
GFR calc Af Amer: 56 mL/min — ABNORMAL LOW (ref >60)
GFR calc non Af Amer: 48 mL/min — ABNORMAL LOW (ref >60)
GLUCOSE: 267 mg/dL — AB (ref 70–99)
Potassium: 4.2 mmol/L (ref 3.5–5.1)
Sodium: 135 mmol/L (ref 135–145)

## 2017-10-22 LAB — GLUCOSE, CAPILLARY: GLUCOSE-CAPILLARY: 225 mg/dL — AB (ref 70–99)

## 2017-10-22 LAB — CBG MONITORING, ED: GLUCOSE-CAPILLARY: 240 mg/dL — AB (ref 70–99)

## 2017-10-22 IMAGING — CT CT VENOGRAM ABD-PELV
2 of 9 series · 12 of 46 positions shown, 17 images · IV contrast (APPLIED)
Comparison: None.

CLINICAL DATA: Acute left lower extremity symptomatic DVT, positive
left lower extremity venous duplex

EXAM:
CT ABDOMEN AND PELVIS WITH CONTRAST (CT venogram protocol)
TECHNIQUE: Multidetector CT imaging of the abdomen and pelvis was performed
using the standard protocol following bolus administration of
intravenous contrast.
CONTRAST:  100mL OMNIPAQUE IOHEXOL 300 MG/ML  SOLN

[Series 4: coronal soft tissue · coronal · 1.06mm/px · 2 of 135 slices shown]
[im 45/135  soft-tissue]
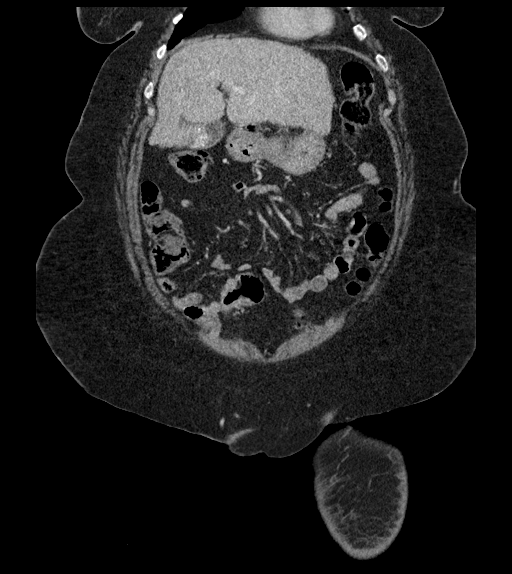
[im 90/135  soft-tissue]
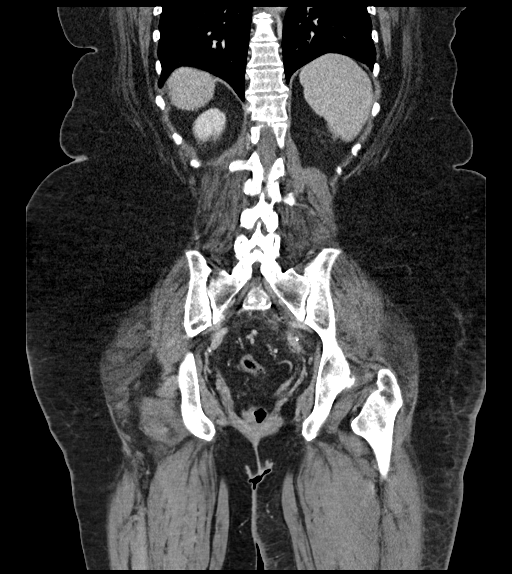

[Series 6: venous abdomen-diaphragm to upper calf · axial · portal-venous · 0.98mm/px · z∈[+576,+1076]mm · 10 of 120 slices shown, 15 images]
[im 10/120  soft-tissue]
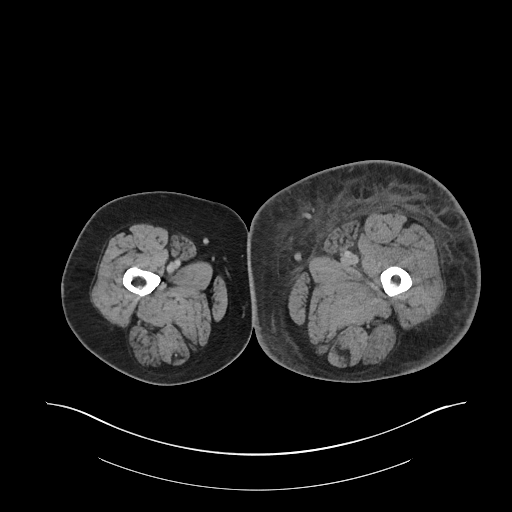
[im 10/120  bone]
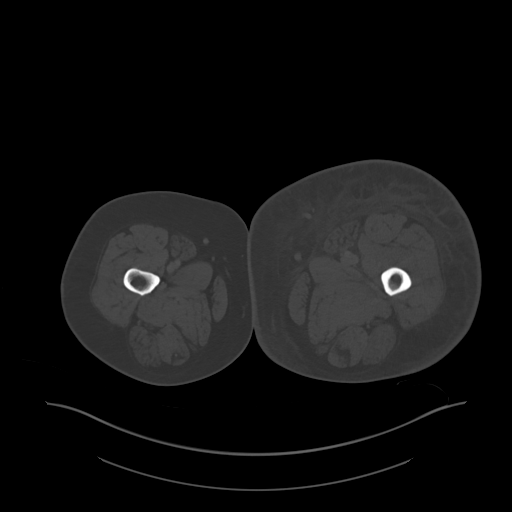
[im 20/120  soft-tissue]
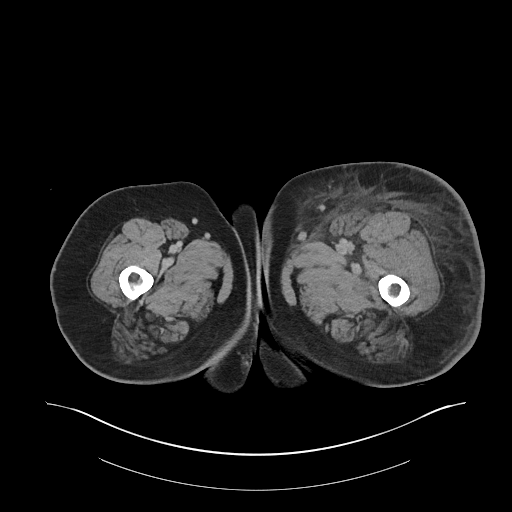
[im 40/120  soft-tissue]
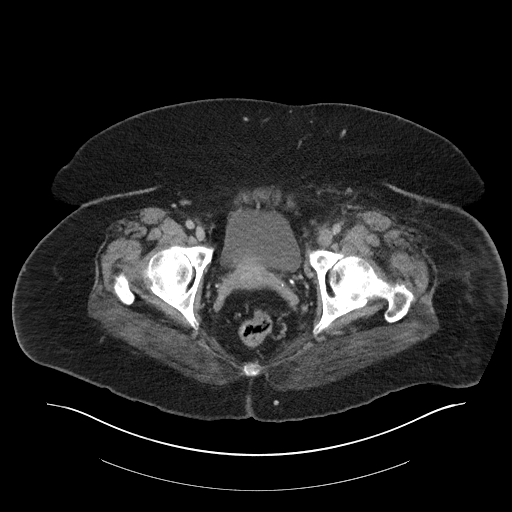
[im 50/120  soft-tissue]
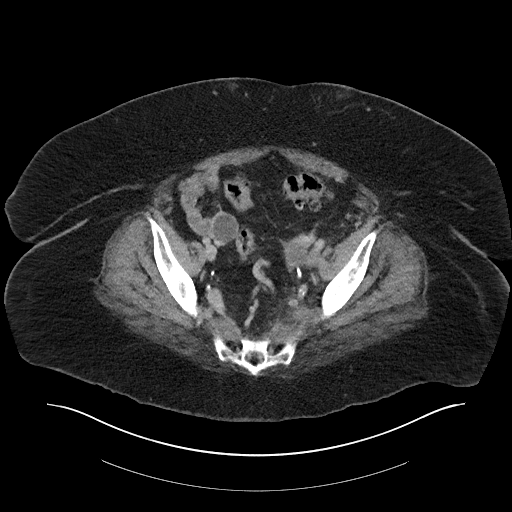
[im 60/120  soft-tissue]
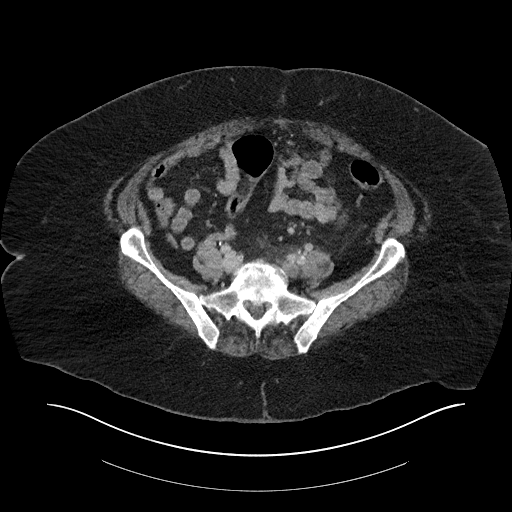
[im 70/120  soft-tissue]
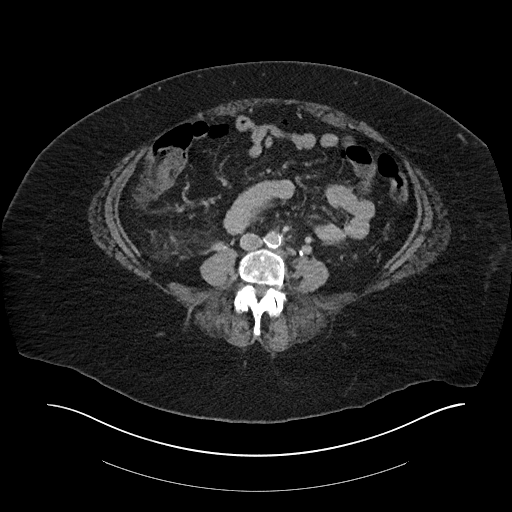
[im 80/120  soft-tissue]
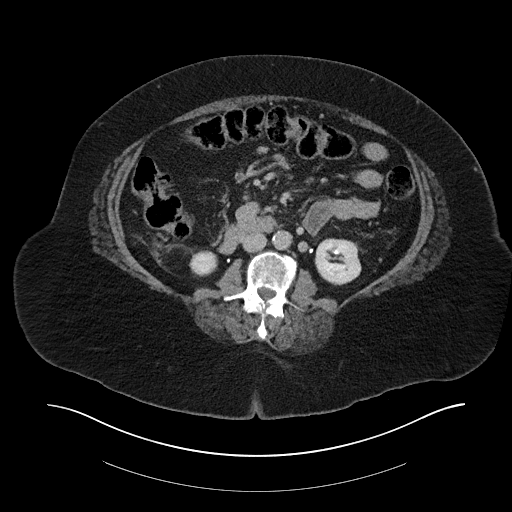
[im 80/120  lung]
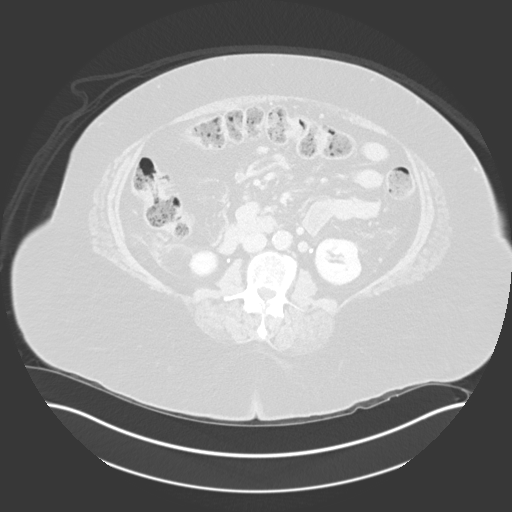
[im 90/120  lung]
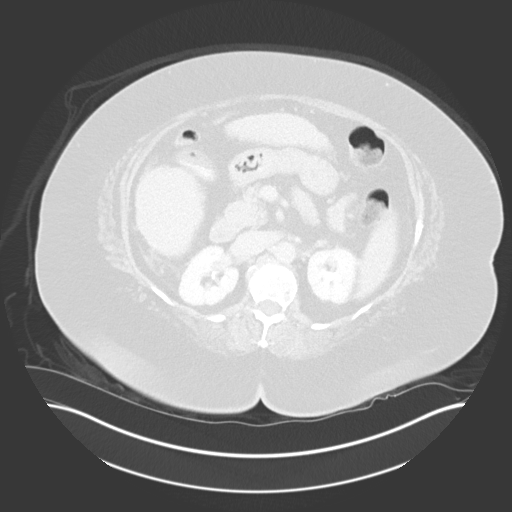
[im 100/120  soft-tissue]
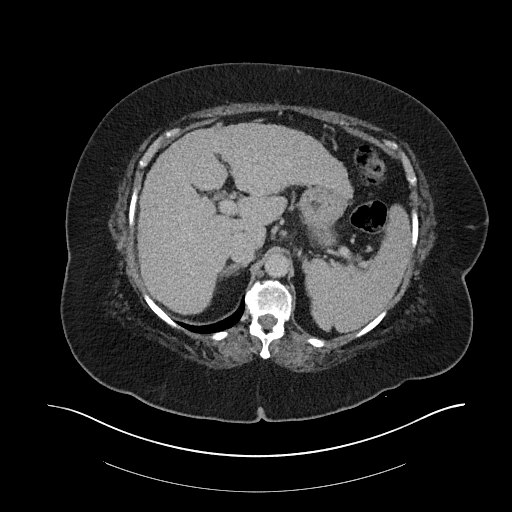
[im 100/120  lung]
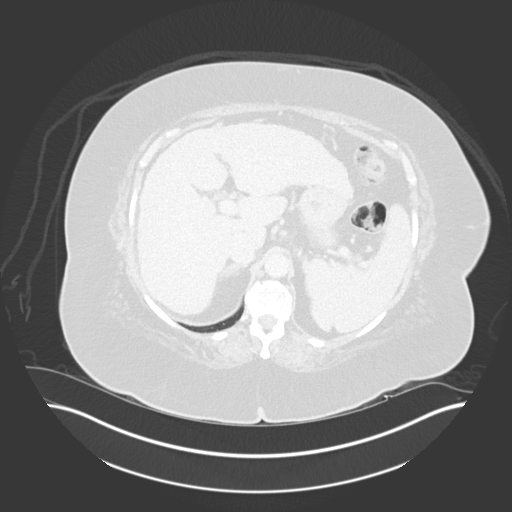
[im 110/120  soft-tissue]
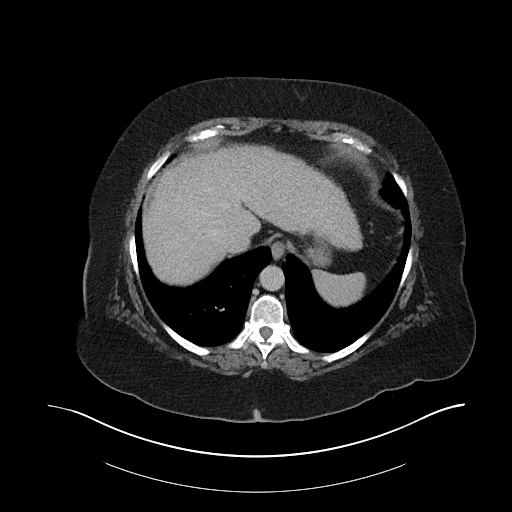
[im 110/120  lung]
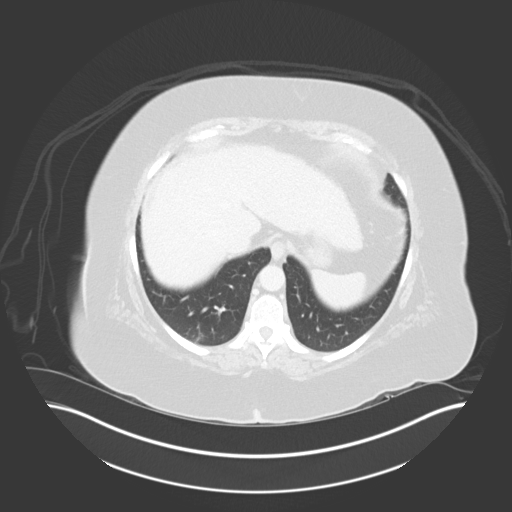
[im 110/120  bone]
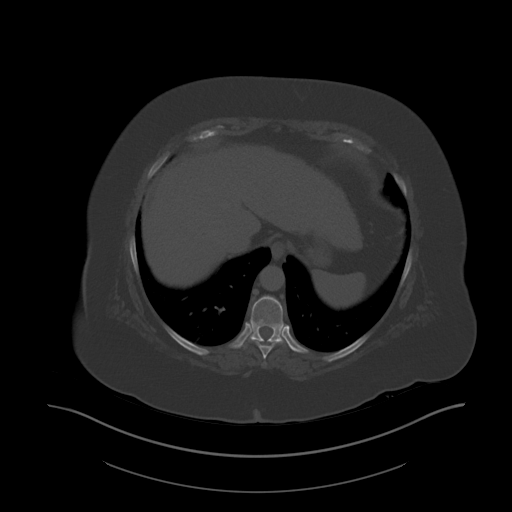

[12 of 46 positions shown; findings below may reference images not displayed]

FINDINGS: Lower chest: Very minor basilar atelectasis. Mitral valve annulus
calcifications noted. Normal heart size. No pericardial or pleural
effusion. No hiatal hernia. Degenerative changes of the lower
thoracic spine.

Hepatobiliary: Extensive nodular hepatic surface and mild
heterogeneity of the hepatic enhancement pattern compatible with
underlying hepatic cirrhosis. No biliary obstruction or dilatation.
Hepatic and portal veins remain patent. Several calcified layering
gallstones noted. Gallbladder nondistended. Common bile duct
nondilated.

Pancreas: Unremarkable. No pancreatic ductal dilatation or
surrounding inflammatory changes.

Spleen: Normal in size without focal abnormality.

Adrenals/Urinary Tract: Normal adrenal glands. Subcentimeter right
kidney cortical cyst. No renal obstruction or hydronephrosis.
Symmetric enhancement and excretion. No hydro ureter, ureteral
calculus, or significant bladder abnormality. Bladder is
underdistended.

Stomach/Bowel: Negative for bowel obstruction, significant
dilatation, ileus, or free air. Appendix not visualized with
certainty. No acute inflammatory process or fluid collection. Mild
mesenteric vascular congestion strandy edema along both pericolic
gutters, suspect related to portal venous hypertension.

Vascular/Lymphatic: Atherosclerosis of the aorta and iliac vessels.
No occlusive process, aneurysm or dissection. No retroperitoneal
hemorrhage or hematoma. Mesenteric and renal vasculature half
atherosclerotic origins but all remain patent.

Hepatic, portal, splenic, mesenteric, and renal veins are patent.
IVC is patent to the level of the bifurcation. At the bifurcation,
there is a persistent nonocclusive filling defect extending into the
IVC from the left iliac veins compatible with distal IVC thrombus.

There is compression of the left common iliac vein between the
overlying right common iliac artery and underlying lumbar spine.
This narrows the left common iliac venous origin. More peripherally
the left iliac venous system is distended and hypodense with
surrounding peri venous strandy edema compatible with diffuse
occlusive iliac DVT. DVT appears to extend into the left common
femoral, femoral, profunda femoral veins correlating with the
duplex. There is diffuse subcutaneous strandy edema throughout the
left proximal thigh. CT venogram findings are compatible with
JOELSON syndrome and acute extensive occlusive left iliofemoral
DVT.

Reproductive: Uterus normal in size. Endometrial canal fluid noted,
nonspecific. Small bilateral ovarian cysts measuring 3 cm on the
right and 3.3 cm on the left. No pelvic free fluid or ascites. No
fluid collection or abscess.

Other: No inguinal or abdominal wall hernia. No significant
abdominal or pelvic ascites.

Musculoskeletal: Degenerative changes of the spine. No acute osseous
finding. Left proximal thigh subcutaneous edema related to the acute
symptomatic DVT.
IMPRESSION: CT venogram findings compatible with JOELSON syndrome as
detailed above with occlusive extensive left iliofemoral DVT with
some extension into the IVC bifurcation and marked left lower
extremity swelling.

Hepatic cirrhosis with evidence of portal venous hypertension as
detailed above

Cholelithiasis

Aortoiliac atherosclerosis without aneurysm or occlusive process

Bilateral small ovarian cysts and nonspecific endometrial canal free
fluid, consider follow-up nonemergent pelvic ultrasound

These results were called by telephone at the time of interpretation
on [DATE] at [DATE] to Dr. JOELSON, who verbally acknowledged
these results.

## 2017-10-22 MED ORDER — METOPROLOL TARTRATE 5 MG/5ML IV SOLN: 2.0000 mg | INTRAVENOUS | Status: DC | PRN

## 2017-10-22 MED ORDER — PHENOL 1.4 % MT LIQD
1.0000 | OROMUCOSAL | Status: DC | PRN
Start: 2017-10-22 — End: 2017-10-24
  Filled 2017-10-22: qty 177

## 2017-10-22 MED ORDER — IOHEXOL 300 MG/ML  SOLN
100.0000 mL | Freq: Once | INTRAMUSCULAR | Status: AC | PRN
  Administered 2017-10-22: 100 mL via INTRAVENOUS

## 2017-10-22 MED ORDER — ALUM & MAG HYDROXIDE-SIMETH 200-200-20 MG/5ML PO SUSP
15.0000 mL | ORAL | Status: DC | PRN
  Administered 2017-10-23: 30 mL via ORAL
  Filled 2017-10-22: qty 30

## 2017-10-22 MED ORDER — INSULIN ASPART 100 UNIT/ML ~~LOC~~ SOLN
0.0000 [IU] | Freq: Every day | SUBCUTANEOUS | Status: DC
  Administered 2017-10-22: 2 [IU] via SUBCUTANEOUS
  Filled 2017-10-22: qty 1

## 2017-10-22 MED ORDER — HEPARIN BOLUS VIA INFUSION
4500.0000 [IU] | Freq: Once | INTRAVENOUS | Status: AC
  Administered 2017-10-22: 4500 [IU] via INTRAVENOUS
  Filled 2017-10-22: qty 4500

## 2017-10-22 MED ORDER — HEPARIN (PORCINE) IN NACL 100-0.45 UNIT/ML-% IJ SOLN
1600.0000 [IU]/h | INTRAMUSCULAR | Status: DC
  Administered 2017-10-22 – 2017-10-23 (×2): 1300 [IU]/h via INTRAVENOUS
  Administered 2017-10-24: 1600 [IU]/h via INTRAVENOUS
  Filled 2017-10-22 (×3): qty 250

## 2017-10-22 MED ORDER — INSULIN ASPART 100 UNIT/ML ~~LOC~~ SOLN
0.0000 [IU] | Freq: Three times a day (TID) | SUBCUTANEOUS | Status: DC
  Administered 2017-10-23: 8 [IU] via SUBCUTANEOUS
  Administered 2017-10-23: 3 [IU] via SUBCUTANEOUS

## 2017-10-22 MED ORDER — POTASSIUM CHLORIDE CRYS ER 20 MEQ PO TBCR: 20.0000 meq | EXTENDED_RELEASE_TABLET | Freq: Once | ORAL | Status: DC

## 2017-10-22 MED ORDER — OXYCODONE-ACETAMINOPHEN 5-325 MG PO TABS
1.0000 | ORAL_TABLET | Freq: Once | ORAL | Status: AC
  Administered 2017-10-22: 1 via ORAL
  Filled 2017-10-22: qty 1

## 2017-10-22 MED ORDER — HYDRALAZINE HCL 20 MG/ML IJ SOLN: 5.0000 mg | INTRAMUSCULAR | Status: DC | PRN

## 2017-10-22 MED ORDER — POTASSIUM CHLORIDE IN NACL 20-0.9 MEQ/L-% IV SOLN
INTRAVENOUS | Status: DC
  Administered 2017-10-22 – 2017-10-24 (×3): via INTRAVENOUS
  Filled 2017-10-22 (×3): qty 1000

## 2017-10-22 MED ORDER — PANTOPRAZOLE SODIUM 40 MG PO TBEC
40.0000 mg | DELAYED_RELEASE_TABLET | Freq: Every day | ORAL | Status: DC
  Administered 2017-10-22 – 2017-10-24 (×3): 40 mg via ORAL
  Filled 2017-10-22 (×3): qty 1

## 2017-10-22 MED ORDER — LABETALOL HCL 5 MG/ML IV SOLN: 10.0000 mg | INTRAVENOUS | Status: DC | PRN

## 2017-10-22 MED ORDER — ONDANSETRON HCL 4 MG/2ML IJ SOLN: 4.0000 mg | Freq: Four times a day (QID) | INTRAMUSCULAR | Status: DC | PRN

## 2017-10-22 MED ORDER — ACETAMINOPHEN 325 MG RE SUPP: 325.0000 mg | RECTAL | Status: DC | PRN

## 2017-10-22 MED ORDER — OXYCODONE-ACETAMINOPHEN 5-325 MG PO TABS
1.0000 | ORAL_TABLET | ORAL | Status: DC | PRN
  Administered 2017-10-22 – 2017-10-23 (×3): 2 via ORAL
  Filled 2017-10-22 (×3): qty 2

## 2017-10-22 MED ORDER — ACETAMINOPHEN 325 MG PO TABS: 325.0000 mg | ORAL_TABLET | ORAL | Status: DC | PRN

## 2017-10-22 MED ORDER — GUAIFENESIN-DM 100-10 MG/5ML PO SYRP: 15.0000 mL | ORAL_SOLUTION | ORAL | Status: DC | PRN

## 2017-10-22 NOTE — ED Notes (Signed)
Unable to locate pt in lobby or ultrasound. Tech first aware, will bring back pt if she is found in lobby.

## 2017-10-22 NOTE — ED Notes (Signed)
Pt brought to ER exam room at this time

## 2017-10-22 NOTE — ED Notes (Signed)
Called [place]. [place] says pt is not there. Checking Epic soon after shows US exam has started. Trying to call us back at this time- will not answer. Will look for pt returning to lobby.

## 2017-10-22 NOTE — Progress Notes (Signed)
ANTICOAGULATION CONSULT NOTE - Initial Consult  Pharmacy Consult for heparin  Indication: DVT  No Known Allergies  Patient Measurements: Height: 5\' 4"  (162.6 cm) Weight: 250 lb (113.4 kg) IBW/kg (Calculated) : 54.7 Heparin Dosing Weight: 81.6kg  Vital Signs: Temp: 99.1 F (37.3 C) (06/25 1505) Temp Source: Oral (06/25 1505) BP: 121/62 (06/25 1730) Pulse Rate: 76 (06/25 1730)  Labs: Recent Labs    10/22/17 1711  HGB 13.1  HCT 42.2  PLT 145*  CREATININE 1.15*    Estimated Creatinine Clearance: 58.6 mL/min (A) (by C-G formula based on SCr of 1.15 mg/dL (H)).   Medical History: Past Medical History:  Diagnosis Date  . Arthritis   . Depression   . Diabetes mellitus without complication ([place])   . Hypertension     Medications:  Infusions:  . heparin      Assessment: 58 yof presented to the ED with leg swelling. 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 To start IV heparin. Baseline Hgb is WNL and platelets are slightly low. She is not on anticoagulation PTA.   Goal of Therapy:  Heparin level 0.3-0.7 units/ml Monitor platelets by anticoagulation protocol: Yes   Plan:  Heparin bolus 4500 units IV x 1 Heparin gtt 1300 units/hr Check a 6 hr heparin level Daily heparin level and CBC  Genevieve Arbaugh, Rande Lawman 10/22/2017,8:40 PM

## 2017-10-22 NOTE — ED Notes (Signed)
Called pt to go back to room. No response.  

## 2017-10-22 NOTE — ED Provider Notes (Addendum)
[facility] EMERGENCY DEPARTMENT Provider Note   CSN: [id number] Arrival date & time: 10/22/17  1439     History   Chief Complaint Chief Complaint  Patient presents with  . Leg Swelling    HPI Tonya Dougherty is a 67 y.o. female with a hx of tobacco abuse, arthritis, depression, DM, HTN and upper GI bleed who presents to the ED with complaints of LLE swelling that has been progressively worsening x 6 days. Patient states her entire left lower extremity is swollen from groin to toe. She states that it is painful in the upper leg when she ambulates and with transition from sitting to standing, but otherwise non painful at rest. No other specific alleviating/aggravating factors. Denies fever, chills, chest pain, or dyspnea. Denies  hemoptysis, recent surgery/trauma, recent long travel, hormone use, personal hx of cancer, or hx of DVT/PE. She thinks her mother may have had DVTs but is not entirely sure. Patient has baseline peripheral neuropathy that is unchanged.   HPI  Past Medical History:  Diagnosis Date  . Arthritis   . Depression   . Diabetes mellitus without complication ([place])   . Hypertension     There are no active problems to display for this patient.   Past Surgical History:  Procedure Laterality Date  . ANKLE SURGERY       OB History   None      Home Medications    Prior to Admission medications   Medication Sig Start Date End Date Taking? Authorizing Provider  diphenhydrAMINE (BENADRYL) 25 MG tablet Take 1 tablet (25 mg total) by mouth 3 (three) times daily. Take one tablet three times daily for two days 02/03/16   Carmin Muskrat, MD  famotidine (PEPCID) 20 MG tablet Take 1 tablet (20 mg total) by mouth 2 (two) times daily. Take one tablet twice daily for two days 02/03/16 02/06/16  Carmin Muskrat, MD  hydrOXYzine (ATARAX/VISTARIL) 10 MG tablet Take 0.5 tablets (5 mg total) by mouth every 8 (eight) hours as needed for itching. 02/03/16    Carmin Muskrat, MD  predniSONE (DELTASONE) 20 MG tablet Take 2 tablets (40 mg total) by mouth daily with breakfast. For the next four days 02/03/16   Carmin Muskrat, MD    Family History History reviewed. No pertinent family history.  Social History Social History   Tobacco Use  . Smoking status: Current Every Day Smoker    Packs/day: 1.00    Types: Cigarettes  . Smokeless tobacco: Never Used  Substance Use Topics  . Alcohol use: No  . Drug use: Not Currently     Allergies   Patient has no known allergies.   Review of Systems Review of Systems  Constitutional: Negative for chills and fever.  Respiratory: Negative for shortness of breath.   Cardiovascular: Positive for leg swelling (LLE). Negative for chest pain.  Gastrointestinal: Negative for abdominal pain, diarrhea and vomiting.  Musculoskeletal: Positive for myalgias (upper portion of LLE).  Neurological: Negative for weakness and numbness.  All other systems reviewed and are negative.    Physical Exam Updated Vital Signs BP 121/71 (BP Location: Right Arm)   Pulse 87   Temp 99.1 F (37.3 C) (Oral)   Resp 18   Ht 5\' 4"  (1.626 m)   Wt 113.4 kg (250 lb)   SpO2 96%   BMI 42.91 kg/m   Physical Exam  Constitutional: She appears well-developed and well-nourished. No distress.  HENT:  Head: Normocephalic and atraumatic.  Eyes: Conjunctivae are  normal. Right eye exhibits no discharge. Left eye exhibits no discharge.  Cardiovascular: Normal rate and regular rhythm.  No murmur heard. Palpable femoral, DP, and PT pulses bilaterally. Confirmed with doppler.   Pulmonary/Chest: Effort normal and breath sounds normal. No respiratory distress. She has no wheezes. She has no rhonchi. She has no rales.  Abdominal: Soft. She exhibits no distension. There is no tenderness.  Musculoskeletal:  Patient left lower extremity with significant edema- 3+ to foot/lower leg, 1+ to upper leg extending to groin area.  Left lower  extremity mildly erythematous and warm to lower leg.  Otherwise no obvious deformities.  Patient has normal range of motion to bilateral ankles, knees, and hips.  She has some mild calf tenderness to palpation, otherwise nontender.  Right lower extremity without edema.  Neurological: She is alert.  Clear speech.  Sensation grossly intact bilateral lower extremities.  Patient has 5 out of 5 strength with plantar and dorsiflexion bilaterally.  Gait is intact.  Skin: Skin is warm and dry. No rash noted.  Psychiatric: She has a normal mood and affect. Her behavior is normal.  Nursing note and vitals reviewed.    ED Treatments / Results  Labs (all labs ordered are listed, but only abnormal results are displayed) Labs Reviewed  BASIC METABOLIC PANEL - Abnormal; Notable for the following components:      Result Value   Glucose, Bld 267 (*)    Creatinine, Ser 1.15 (*)    GFR calc non Af Amer 48 (*)    GFR calc Af Amer 56 (*)    All other components within normal limits  CBC WITH DIFFERENTIAL/PLATELET - Abnormal; Notable for the following components:   RDW 16.7 (*)    Platelets 145 (*)    All other components within normal limits  HEPARIN LEVEL (UNFRACTIONATED)  CBC  HIV ANTIBODY (ROUTINE TESTING)  PROTIME-INR  URINALYSIS, ROUTINE W REFLEX MICROSCOPIC    EKG None  Radiology Ct Venogram Abd/pel  Result Date: 10/22/2017 CLINICAL DATA:  Acute left lower extremity symptomatic DVT, positive left lower extremity venous duplex EXAM: CT ABDOMEN AND PELVIS WITH CONTRAST (CT venogram protocol) TECHNIQUE: Multidetector CT imaging of the abdomen and pelvis was performed using the standard protocol following bolus administration of intravenous contrast. CONTRAST:  112mL OMNIPAQUE IOHEXOL 300 MG/ML  SOLN COMPARISON:  None. FINDINGS: Lower chest: Very minor basilar atelectasis. Mitral valve annulus calcifications noted. Normal heart size. No pericardial or pleural effusion. No hiatal hernia.  Degenerative changes of the lower thoracic spine. Hepatobiliary: Extensive nodular hepatic surface and mild heterogeneity of the hepatic enhancement pattern compatible with underlying hepatic cirrhosis. No biliary obstruction or dilatation. Hepatic and portal veins remain patent. Several calcified layering gallstones noted. Gallbladder nondistended. Common bile duct nondilated. Pancreas: Unremarkable. No pancreatic ductal dilatation or surrounding inflammatory changes. Spleen: Normal in size without focal abnormality. Adrenals/Urinary Tract: Normal adrenal glands. Subcentimeter right kidney cortical cyst. No renal obstruction or hydronephrosis. Symmetric enhancement and excretion. No hydro ureter, ureteral calculus, or significant bladder abnormality. Bladder is underdistended. Stomach/Bowel: Negative for bowel obstruction, significant dilatation, ileus, or free air. Appendix not visualized with certainty. No acute inflammatory process or fluid collection. Mild mesenteric vascular congestion strandy edema along both pericolic gutters, suspect related to portal venous hypertension. Vascular/Lymphatic: Atherosclerosis of the aorta and iliac vessels. No occlusive process, aneurysm or dissection. No retroperitoneal hemorrhage or hematoma. Mesenteric and renal vasculature half atherosclerotic origins but all remain patent. Hepatic, portal, splenic, mesenteric, and renal veins are patent. IVC is patent  to the level of the bifurcation. At the bifurcation, there is a persistent nonocclusive filling defect extending into the IVC from the left iliac veins compatible with distal IVC thrombus. There is compression of the left common iliac vein between the overlying right common iliac artery and underlying lumbar spine. This narrows the left common iliac venous origin. More peripherally the left iliac venous system is distended and hypodense with surrounding peri venous strandy edema compatible with diffuse occlusive iliac DVT.  DVT appears to extend into the left common femoral, femoral, profunda femoral veins correlating with the duplex. There is diffuse subcutaneous strandy edema throughout the left proximal thigh. 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 Reproductive: Uterus normal in size. Endometrial canal fluid noted, nonspecific. Small bilateral ovarian cysts measuring 3 cm on the right and 3.3 cm on the left. No pelvic free fluid or ascites. No fluid collection or abscess. Other: No inguinal or abdominal wall hernia. No significant abdominal or pelvic ascites. Musculoskeletal: Degenerative changes of the spine. No acute osseous finding. Left proximal thigh subcutaneous edema related to the acute symptomatic DVT. 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 Wilson Singer, who verbally acknowledged these results. Electronically Signed   By: Jerilynn Mages.  Shick M.D.   On: 10/22/2017 20:22    Procedures Procedures (including critical care time) CRITICAL CARE Performed by: Kennith Maes   Total critical care time: 35 minutes  Critical care time was exclusive of separately billable procedures and treating other patients.  Critical care was necessary to treat or prevent imminent or life-threatening deterioration.  Critical care was time spent personally by me on the following activities: development of treatment  plan with patient and/or surrogate as well as nursing, discussions with consultants, evaluation of patient's response to treatment, examination of patient, obtaining history from patient or surrogate, ordering and performing treatments and interventions, ordering and review of laboratory studies, ordering and review of radiographic studies, pulse oximetry and re-evaluation of patient's condition.  Medications Ordered in ED Medications - No data to display   Initial Impression / Assessment and Plan / ED Course  I have reviewed the triage vital signs and the nursing notes.  Pertinent labs & imaging results that were available during my care of the patient were reviewed by me and considered in my medical decision making (see chart for details).   Patient presents with LLE edema and discomfort. Patient nontoxic appearing, in no apparent distress, initial vitals WNL. Exam as above- significant edema to LLE. Left lower extremity venous duplex has been completed, ordered by triage provider: There is evidence of acute deep vein thrombosis involving the common femoral, femoral, popliteal, intramuscular gastrocnemius, and posterior tibial veins of the left lower extremity. There is also evidence of acute superficial vein thrombosis involving the great saphenous vein of the left lower extremity. Patient without dyspnea/chest pain to raise concern for pulmonary embolism. Will obtain basic labs and consult vascular surgery given clot burden.   18:20: CONSULT: Discussed case with vascular surgeon Dr. Scot Dock- recommends obtaining CT venogram to  evaluate for iliac clot burden. If no iliac clot burden- anticoagulation, if present request call back and he will discuss options with the patient.   Radiology spoke with Dr. 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 Cholelithiasis. 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 Results were discussed with the patient. Will start heparin with consult to Dr. Scot Dock.   20:38: CONSULT: Discussed case with Dr. Scot Dock who will come to the ER to discuss with the patient.   Dr. Scot Dock has admitted the patient for further management.   Final Clinical Impressions(s) / ED Diagnoses   Final diagnoses:  May-Thurner syndrome  Acute deep vein thrombosis (DVT) of iliac vein of left lower extremity [facility])    ED Discharge Orders    None       Amaryllis Dyke, PA-C 10/22/17 2243    Amaryllis Dyke, PA-C 10/23/17 0033    Virgel Manifold, MD 10/23/17 [address], [place], PA-C 10/24/17 1103    Virgel Manifold, MD 10/26/17 1626

## 2017-10-22 NOTE — ED Notes (Deleted)
Pt found outside.

## 2017-10-22 NOTE — Progress Notes (Addendum)
Left lower extremity venous duplex has been completed. There is evidence of acute deep vein thrombosis involving the common femoral, femoral, popliteal, intramuscular gastrocnemius, and posterior tibial veins of the left lower extremity. There is also evidence of acute superficial vein thrombosis involving the great saphenous vein of the left lower extremity. Results were given to Okey Regal PA.  10/22/17 4:26 PM Tonya Dougherty RVT

## 2017-10-22 NOTE — Progress Notes (Signed)
Patient arrived to room 4E06 from ED.  Assessment complete, VS obtained, and Admission database began. 

## 2017-10-22 NOTE — H&P (Signed)
Patient name: Tonya Dougherty MRN: [id number] DOB: 06/01/1950 Sex: female   REASON FOR CONSULT:    Extensive left lower extremity DVT.  The consult is requested by the emergency department.  HPI:   Tonya Dougherty is a pleasant 67 y.o. female, who presents with extensive swelling of the left lower extremity.  She has had swelling off and on in the left lower extremity for years and has had several ultrasound studies in the past none of which showed evidence of a DVT.  However Friday a.m., 4 days ago, she began noticing swelling in the left leg which extended up above the knee which was a new finding.  This progressed and she presented to the emergency department.  She underwent a duplex scan which showed extensive DVT of the left lower extremity and vascular surgery was consulted.  She denies any previous history of DVT.  She denies any recent travel, injury, or surgery.  She thinks that she had a family member who had a blood clot but is not sure.  She denies any history of recent myocardial infarction, surgery, stroke or intracranial surgery.  She did have a significant GI bleed in 2014 and was hospitalized in [place].  She states that she was in a coma at that time from significant bleeding.  It sounds like she had an upper GI bleed.  I am unable to recover the records from [place].  Past Medical History:  Diagnosis Date  . Arthritis   . Depression   . Diabetes mellitus without complication ([place])   . Hypertension    History reviewed. No pertinent family history.  She denies any family history of premature cardiovascular disease.  SOCIAL HISTORY: She smokes a half a pack per day of cigarettes. Social History   Socioeconomic History  . Marital status: Divorced    Spouse name: Not on file  . Number of children: Not on file  . Years of education: Not on file  . Highest education level: Not on file  Occupational History  . Not on file  Social Needs  . Financial resource  strain: Not on file  . Food insecurity:    Worry: Not on file    Inability: Not on file  . Transportation needs:    Medical: Not on file    Non-medical: Not on file  Tobacco Use  . Smoking status: Current Every Day Smoker    Packs/day: 1.00    Types: Cigarettes  . Smokeless tobacco: Never Used  Substance and Sexual Activity  . Alcohol use: No  . Drug use: Not Currently  . Sexual activity: Not on file  Lifestyle  . Physical activity:    Days per week: Not on file    Minutes per session: Not on file  . Stress: Not on file  Relationships  . Social connections:    Talks on phone: Not on file    Gets together: Not on file    Attends religious service: Not on file    Active member of club or organization: Not on file    Attends meetings of clubs or organizations: Not on file    Relationship status: Not on file  . Intimate partner violence:    Fear of current or ex partner: Not on file    Emotionally abused: Not on file    Physically abused: Not on file    Forced sexual activity: Not on file  Other Topics Concern  . Not on file  Social History  Narrative  . Not on file    No Known Allergies  Current Facility-Administered Medications  Medication Dose Route Frequency Provider Last Rate Last Dose  . 0.9 % NaCl with KCl 20 mEq/ L  infusion   Intravenous Continuous Angelia Mould, MD      . acetaminophen (TYLENOL) tablet 325-650 mg  325-650 mg Oral Q4H PRN Angelia Mould, MD       Or  . acetaminophen (TYLENOL) suppository 325-650 mg  325-650 mg Rectal Q4H PRN Angelia Mould, MD      . alum & mag hydroxide-simeth (MAALOX/MYLANTA) 200-200-20 MG/5ML suspension 15-30 mL  15-30 mL Oral Q2H PRN Angelia Mould, MD      . guaiFENesin-dextromethorphan [facility] DM) 100-10 MG/5ML syrup 15 mL  15 mL Oral Q4H PRN Angelia Mould, MD      . heparin ADULT infusion 100 units/mL (25000 units/244mL sodium chloride 0.45%)  1,300 Units/hr Intravenous  Continuous Rumbarger, Valeda Malm, RPH 13 mL/hr at 10/22/17 2133 1,300 Units/hr at 10/22/17 2133  . hydrALAZINE (APRESOLINE) injection 5 mg  5 mg Intravenous Q20 Min PRN Angelia Mould, MD      . Derrill Memo ON 10/23/2017] insulin aspart (novoLOG) injection 0-15 Units  0-15 Units Subcutaneous TID WC Angelia Mould, MD      . insulin aspart (novoLOG) injection 0-5 Units  0-5 Units Subcutaneous QHS Angelia Mould, MD   2 Units at 10/22/17 2151  . labetalol (NORMODYNE,TRANDATE) injection 10 mg  10 mg Intravenous Q10 min PRN Angelia Mould, MD      . metoprolol tartrate (LOPRESSOR) injection 2-5 mg  2-5 mg Intravenous Q2H PRN Angelia Mould, MD      . ondansetron [facility]) injection 4 mg  4 mg Intravenous Q6H PRN Angelia Mould, MD      . oxyCODONE-acetaminophen (PERCOCET/ROXICET) 5-325 MG per tablet 1-2 tablet  1-2 tablet Oral Q4H PRN Angelia Mould, MD      . pantoprazole (PROTONIX) EC tablet 40 mg  40 mg Oral Daily Angelia Mould, MD      . phenol (CHLORASEPTIC) mouth spray 1 spray  1 spray Mouth/Throat PRN Angelia Mould, MD        REVIEW OF SYSTEMS:  [X]  denotes positive finding, [ ]  denotes negative finding Cardiac  Comments:  Chest pain or chest pressure:    Shortness of breath upon exertion: x   Short of breath when lying flat:    Irregular heart rhythm:        Vascular    Pain in calf, thigh, or hip brought on by ambulation:    Pain in feet at night that wakes you up from your sleep:  x  she has a history of neuropathy in her feet.  Blood clot in your veins:    Leg swelling:         Pulmonary    Oxygen at home:    Productive cough:     Wheezing:         Neurologic    Sudden weakness in arms or legs:     Sudden numbness in arms or legs:     Sudden onset of difficulty speaking or slurred speech:    Temporary loss of vision in one eye:     Problems with dizziness:         Gastrointestinal    Blood in stool:       Vomited blood:         Genitourinary  Burning when urinating:     Blood in urine:        Psychiatric    Major depression:         Hematologic    Bleeding problems:    Problems with blood clotting too easily:        Skin    Rashes or ulcers:        Constitutional    Fever or chills:     PHYSICAL EXAM:   Vitals:   10/22/17 1700 10/22/17 1730 10/22/17 2130 10/22/17 2227  BP: 128/65 121/62 (!) 146/92 122/73  Pulse: 82 76 85   Resp:    20  Temp:    98.1 F (36.7 C)  TempSrc:    Oral  SpO2: 99% 97% 94% 97%  Weight:    245 lb (111.1 kg)  Height:    5\' 2"  (1.575 m)  Body mass index is 44.81 kg/m.   GENERAL: The patient is a well-nourished female, in no acute distress. The vital signs are documented above. CARDIAC: There is a regular rate and rhythm.  VASCULAR: I do not detect carotid bruits. On the left side, which is the symptomatic side, I cannot palpate a femoral pulse.  This is because of her significant leg swelling.  She has biphasic Doppler signals in the posterior tibial and dorsalis pedis positions. On the right side she has a palpable femoral, popliteal, and dorsalis pedis pulse. She has significant left lower extremity swelling up to the hip. There is mild cellulitis associated with this. PULMONARY: There is good air exchange bilaterally without wheezing or rales. ABDOMEN: Soft and non-tender with normal pitched bowel sounds.  MUSCULOSKELETAL: There are no major deformities or cyanosis. NEUROLOGIC: No focal weakness or paresthesias are detected. SKIN: There are no ulcers or rashes noted. PSYCHIATRIC: The patient has a normal affect.  DATA:    VENOUS DUPLEX: I have independently interpreted the venous duplex scan of her left lower extremity.  This shows acute deep venous thrombosis involving the common femoral vein, femoral vein, popliteal vein, and posterior tibial veins of the left lower extremity.  There is also evidence of acute superficial venous  thrombosis involving the great saphenous vein.  CT VENOGRAM: This shows evidence of May Thurner syndrome of the left common iliac vein.  There is compression of the left common iliac vein between the overlying right common iliac artery and spine.  She appears to have occlusive DVT of the left iliac vein.  There is also some distal IVC thrombus.  MEDICAL ISSUES:   EXTENSIVE LEFT LOWER EXTREMITY ILIOFEMORAL DVT: This patient presents with an extensive left lower extremity DVT which involves the common iliac vein, external iliac vein, and also the femoral and popliteal veins on the left.  This appears to be secondary to May Thurner syndrome.  The patient has been started on intravenous heparin and I have also elevated her legs.  I have written for the nurses to continue proper leg elevation.  I have explained to her that the options are anticoagulation in which case she can be started on Xarelto versus the more aggressive approach of thrombolysis.  My main concern with respect of her thrombolyzes is her extensive bleeding problem that she had in 2014 in [place].  I am unable to retrieve these records.  I have discussed the potential complications of thrombolysis including the risk of bleeding including the potential risk of intracranial bleeding.  We have also discussed the risk of renal failure.  She is  somewhat reluctant to consider an aggressive approach.  I will see how she does overnight with heparin and leg elevation.  I explained that we will discuss these options further tomorrow.  Deitra Mayo [facility] [phone]

## 2017-10-22 NOTE — ED Triage Notes (Signed)
Pt arrived with c/o right leg swelling "from toes to buttock". Pt reports she has been taking her fluid pills and has had plenty of output, and that her urine is clear.

## 2017-10-22 NOTE — ED Provider Notes (Signed)
Patient placed in Quick Look pathway, seen and evaluated   Chief Complaint: Left leg swelling  HPI:   Left leg swelling from toes to buttock since Friday.  She reports the right leg is not swollen, reports compliance with medications.    ROS: No fevers, no CP.SOB.  (one)  Physical Exam:   Gen: No distress  Neuro: Awake and Alert  Skin: Warm    Focused Exam: 2+ pitting edema left leg, obvious swelling left leg.    Initiation of care has begun. The patient has been counseled on the process, plan, and necessity for staying for the completion/evaluation, and the remainder of the medical screening examination    Ollen Gross 10/22/17 1516    Tegeler, Gwenyth Allegra, MD 10/22/17 1622

## 2017-10-23 LAB — CBC
HCT: 38.8 % (ref 36.0–46.0)
Hemoglobin: 12.1 g/dL (ref 12.0–15.0)
MCH: 27.6 pg (ref 26.0–34.0)
MCHC: 31.2 g/dL (ref 30.0–36.0)
MCV: 88.4 fL (ref 78.0–100.0)
Platelets: 148 10*3/uL — ABNORMAL LOW (ref 150–400)
RBC: 4.39 MIL/uL (ref 3.87–5.11)
RDW: 16.9 % — ABNORMAL HIGH (ref 11.5–15.5)
WBC: 8.5 10*3/uL (ref 4.0–10.5)

## 2017-10-23 LAB — HEMOGLOBIN A1C
Hgb A1c MFr Bld: 10.6 % — ABNORMAL HIGH (ref 4.8–5.6)
MEAN PLASMA GLUCOSE: 257.52 mg/dL

## 2017-10-23 LAB — GLUCOSE, CAPILLARY
Glucose-Capillary: 197 mg/dL — ABNORMAL HIGH (ref 70–99)
Glucose-Capillary: 255 mg/dL — ABNORMAL HIGH (ref 70–99)
Glucose-Capillary: 281 mg/dL — ABNORMAL HIGH (ref 70–99)
Glucose-Capillary: 242 mg/dL — ABNORMAL HIGH (ref 70–99)

## 2017-10-23 LAB — URINALYSIS, ROUTINE W REFLEX MICROSCOPIC
Bilirubin Urine: NEGATIVE
GLUCOSE, UA: 150 mg/dL — AB
HGB URINE DIPSTICK: NEGATIVE
Ketones, ur: NEGATIVE mg/dL
Leukocytes, UA: NEGATIVE
Nitrite: NEGATIVE
Protein, ur: NEGATIVE mg/dL
SPECIFIC GRAVITY, URINE: 1.018 (ref 1.005–1.030)
pH: 5 (ref 5.0–8.0)

## 2017-10-23 LAB — MRSA PCR SCREENING: MRSA by PCR: POSITIVE — AB

## 2017-10-23 LAB — HEPARIN LEVEL (UNFRACTIONATED)
Heparin Unfractionated: 0.42 IU/mL (ref 0.30–0.70)
Heparin Unfractionated: <0.1 IU/mL — ABNORMAL LOW (ref 0.30–0.70)

## 2017-10-23 LAB — HIV ANTIBODY (ROUTINE TESTING W REFLEX): HIV SCREEN 4TH GENERATION: NONREACTIVE

## 2017-10-23 LAB — MAGNESIUM: Magnesium: 2.1 mg/dL (ref 1.7–2.4)

## 2017-10-23 MED ORDER — CHLORHEXIDINE GLUCONATE CLOTH 2 % EX PADS
6.0000 | MEDICATED_PAD | Freq: Every day | CUTANEOUS | Status: DC
  Administered 2017-10-23 – 2017-10-24 (×2): 6 via TOPICAL

## 2017-10-23 MED ORDER — MUPIROCIN 2 % EX OINT
1.0000 "application " | TOPICAL_OINTMENT | Freq: Two times a day (BID) | CUTANEOUS | Status: DC
  Administered 2017-10-23 – 2017-10-24 (×3): 1 via NASAL
  Filled 2017-10-23: qty 22

## 2017-10-23 MED ORDER — HEPARIN BOLUS VIA INFUSION
2000.0000 [IU] | Freq: Once | INTRAVENOUS | Status: AC
  Administered 2017-10-23: 2000 [IU] via INTRAVENOUS
  Filled 2017-10-23: qty 2000

## 2017-10-23 MED ORDER — INSULIN GLARGINE 100 UNIT/ML ~~LOC~~ SOLN
25.0000 [IU] | Freq: Every day | SUBCUTANEOUS | Status: DC
  Administered 2017-10-23 – 2017-10-24 (×2): 25 [IU] via SUBCUTANEOUS
  Filled 2017-10-23 (×2): qty 0.25

## 2017-10-23 MED ORDER — INSULIN ASPART 100 UNIT/ML ~~LOC~~ SOLN
0.0000 [IU] | Freq: Every day | SUBCUTANEOUS | Status: DC
  Administered 2017-10-23: 2 [IU] via SUBCUTANEOUS

## 2017-10-23 MED ORDER — INSULIN ASPART 100 UNIT/ML ~~LOC~~ SOLN
0.0000 [IU] | Freq: Three times a day (TID) | SUBCUTANEOUS | Status: DC
  Administered 2017-10-23: 8 [IU] via SUBCUTANEOUS
  Administered 2017-10-24: 5 [IU] via SUBCUTANEOUS
  Administered 2017-10-24: 3 [IU] via SUBCUTANEOUS

## 2017-10-23 MED ORDER — INSULIN ASPART 100 UNIT/ML ~~LOC~~ SOLN
4.0000 [IU] | Freq: Three times a day (TID) | SUBCUTANEOUS | Status: DC
  Administered 2017-10-24: 4 [IU] via SUBCUTANEOUS

## 2017-10-23 NOTE — Progress Notes (Addendum)
   VASCULAR SURGERY ASSESSMENT & PLAN:   EXTENSIVE LEFT LOWER EXTREMITY DVT: I have reviewed the records from [facility].  The patient had a significant GI bleed and was in a coma in 2014.  She has a history of cirrhosis.  This was complicated by variceal bleeding.  For this reason, I do not think she is a good candidate for thrombolysis as I think she would be at significant risk for a bleeding complication.  In addition, she has some mild renal insufficiency with a GFR of 48.  She is morbidly obese with a BMI of 45.  In addition her blood sugar is not well controlled.  I have recommended that we keep her in the hospital for another 24 hours of intravenous heparin and then convert her to her Xarelto with plans for discharge tomorrow on Xarelto.  She may need a small amount of pain medication for discharge.  We have had a long discussion about the importance leg elevation and the proper positioning for this.   DM:  Her diabetes does not appear to be under good control.  I have her on her home medications.  She is also on a sliding scale.  I have consulted the diabetes coordinator for assistance with management of her diabetes.  She will need close follow-up with her primary care physician.  I also ordered a hemoglobin A1c.   SUBJECTIVE:   She states that the leg feels better.  PHYSICAL EXAM:   Vitals:   10/22/17 2130 10/22/17 2227 10/23/17 0015 10/23/17 0420  BP: (!) 146/92 122/73 122/73 116/71  Pulse: 85  84 91  Resp:  20 18 (!) 25  Temp:  98.1 F (36.7 C)  (!) 97.5 F (36.4 C)  TempSrc:  Oral  Oral  SpO2: 94% 97% 97% 96%  Weight:  245 lb (111.1 kg)    Height:  5\' 2"  (1.575 m)    Body mass index is 44.81 kg/m.  Swelling in the left leg is mildly improved. The left foot is warm and well-perfused.  LABS:   Lab Results  Component Value Date   WBC 8.5 10/23/2017   HGB 12.1 10/23/2017   HCT 38.8 10/23/2017   MCV 88.4 10/23/2017   PLT 148 (L) 10/23/2017   Lab Results    Component Value Date   CREATININE 1.15 (H) 10/22/2017   Lab Results  Component Value Date   INR 1.11 10/22/2017   CBG (last 3)  Recent Labs    10/22/17 2144 10/22/17 2240 10/23/17 0638  GLUCAP 240* 225* 197*    PROBLEM LIST:    Active Problems:   DVT (deep venous thrombosis) (HCC)   CURRENT MEDS:   . insulin aspart  0-15 Units Subcutaneous TID WC  . insulin aspart  0-5 Units Subcutaneous QHS  . pantoprazole  40 mg Oral Daily    Deitra Mayo Beeper: [phone] Office: [phone] 10/23/2017

## 2017-10-23 NOTE — Care Management Note (Signed)
Case Management Note Marvetta Gibbons RN, BSN Unit 4E-Case Manager [phone]  Patient Details  Name: Tonya Dougherty MRN: [id number] Date of Birth: [birth year]  Subjective/Objective:   Pt admitted with DVT                 Action/Plan: PTA pt lived at home- referral for Xarelto needs- per benefits check- copay should be less then $8.50- spoke with pt at bedside- per pt she has both Medicaid and Silver scripts- which covers what Medicaid doesn't- Xarelto is a preferred med under Medicaid- pt should pay no more than $8.50 however may be less- CM to follow for any further needs.   Expected Discharge Date:                  Expected Discharge Plan:  Home/Self Care  In-House Referral:     Discharge planning Services  CM Consult, Medication Assistance  Post Acute Care Choice:    Choice offered to:     DME Arranged:    DME Agency:     HH Arranged:    HH Agency:     Status of Service:  In process, will continue to follow  If discussed at Long Length of Stay Meetings, dates discussed:    Discharge Disposition:   Additional Comments:  Dawayne Patricia, RN 10/23/2017, 4:53 PM

## 2017-10-23 NOTE — Progress Notes (Signed)
Per insurance check on Xarelto  # 1. S/W DEBBIE @ [employer] CHOICE PDP RX # [id number]    XARELTO 20 MG DAILY  COVER- YES  CO-PAY- $ 8.50  TIER- NO  PRIOR APPROVAL- NO   PREFERRED PHARMACY : [facility]   PATIENT ALSO HAS MEDICAID [place]  EFF- DATE - 09/28/2017  CO-PAY- $ 3.80 FOR EACH RX

## 2017-10-23 NOTE — Progress Notes (Signed)
ANTICOAGULATION CONSULT NOTE   Pharmacy Consult for heparin  Indication: DVT  No Known Allergies  Patient Measurements: Height: 5\' 2"  (157.5 cm) Weight: 245 lb (111.1 kg) IBW/kg (Calculated) : 50.1 Heparin Dosing Weight: 81.6kg  Vital Signs: Temp: 98.1 F (36.7 C) (06/25 2227) Temp Source: Oral (06/25 2227) BP: 122/73 (06/26 0015) Pulse Rate: 84 (06/26 0015)  Labs: Recent Labs    10/22/17 1711 10/22/17 2227 10/23/17 0331  HGB 13.1  --  12.1  HCT 42.2  --  38.8  PLT 145*  --  148*  LABPROT  --  14.2  --   INR  --  1.11  --   HEPARINUNFRC  --   --  0.42  CREATININE 1.15*  --   --     Estimated Creatinine Clearance: 55.8 mL/min (A) (by C-G formula based on SCr of 1.15 mg/dL (H)).   Medical History: Past Medical History:  Diagnosis Date  . Arthritis   . Depression   . Diabetes mellitus without complication ([place])   . Hypertension     Medications:  Infusions:  . 0.9 % NaCl with KCl 20 mEq / L 75 mL/hr at 10/23/17 0421  . heparin 1,300 Units/hr (10/22/17 2133)    Assessment: 66 yof presented to the ED with leg swelling. 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 To start IV heparin. Baseline Hgb is WNL and platelets are slightly low. She is not on anticoagulation PTA.  Initial heparin level 0.42 units/ml  Goal of Therapy:  Heparin level 0.3-0.7 units/ml Monitor platelets by anticoagulation protocol: Yes   Plan:  Heparin gtt 1300 units/hr Check a 6 hr heparin level to confirm Daily heparin level and CBC  Tonya Dougherty 10/23/2017,4:22 AM

## 2017-10-23 NOTE — Progress Notes (Signed)
ANTICOAGULATION CONSULT NOTE   Pharmacy Consult for heparin  Indication: DVT  No Known Allergies  Patient Measurements: Height: 5\' 2"  (157.5 cm) Weight: 245 lb (111.1 kg) IBW/kg (Calculated) : 50.1 Heparin Dosing Weight: 81.6kg  Vital Signs: Temp: 98.6 F (37 C) (06/26 2100) Temp Source: Oral (06/26 2100) BP: 154/73 (06/26 1651) Pulse Rate: 80 (06/26 1651)  Labs: Recent Labs    10/22/17 1711 10/22/17 2227 10/23/17 0331 10/23/17 0950 10/23/17 1937  HGB 13.1  --  12.1  --   --   HCT 42.2  --  38.8  --   --   PLT 145*  --  148*  --   --   LABPROT  --  14.2  --   --   --   INR  --  1.11  --   --   --   HEPARINUNFRC  --   --  0.42 <0.10* <0.10*  CREATININE 1.15*  --   --   --   --    Estimated Creatinine Clearance: 55.8 mL/min (A) (by C-G formula based on SCr of 1.15 mg/dL (H)).  Medical History: Past Medical History:  Diagnosis Date  . Arthritis   . Depression   . Diabetes mellitus without complication ([place])   . Hypertension     Medications:  Infusions:  . 0.9 % NaCl with KCl 20 mEq / L 75 mL/hr at 10/23/17 1813  . heparin 1,300 Units/hr (10/23/17 1813)    Assessment: 56 yof presented to the ED with leg swelling. 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 To start IV heparin. Baseline Hgb is WNL and platelets are slightly low. She is not on anticoagulation PTA.   Repeat heparin level still undetectable tonight. No IV or bleeding issues noted.   Goal of Therapy:  Heparin level 0.3-0.7 units/ml Monitor platelets by anticoagulation protocol: Yes   Plan:  Bolus heparin 2000 units Increase heparin gtt to 1600 units/hr Check a 6 hr heparin level  Daily heparin level and CBC  Erin Hearing PharmD., BCPS Clinical Pharmacist 10/23/2017 10:01 PM

## 2017-10-23 NOTE — Progress Notes (Signed)
ANTICOAGULATION CONSULT NOTE   Pharmacy Consult for heparin  Indication: DVT  No Known Allergies  Patient Measurements: Height: 5\' 2"  (157.5 cm) Weight: 245 lb (111.1 kg) IBW/kg (Calculated) : 50.1 Heparin Dosing Weight: 81.6kg  Vital Signs: Temp: 98.1 F (36.7 C) (06/26 1137) Temp Source: Oral (06/26 1137) BP: 102/60 (06/26 1137) Pulse Rate: 81 (06/26 1137)  Labs: Recent Labs    10/22/17 1711 10/22/17 2227 10/23/17 0331 10/23/17 0950  HGB 13.1  --  12.1  --   HCT 42.2  --  38.8  --   PLT 145*  --  148*  --   LABPROT  --  14.2  --   --   INR  --  1.11  --   --   HEPARINUNFRC  --   --  0.42 <0.10*  CREATININE 1.15*  --   --   --    Estimated Creatinine Clearance: 55.8 mL/min (A) (by C-G formula based on SCr of 1.15 mg/dL (H)).  Medical History: Past Medical History:  Diagnosis Date  . Arthritis   . Depression   . Diabetes mellitus without complication ([place])   . Hypertension     Medications:  Infusions:  . 0.9 % NaCl with KCl 20 mEq / L 75 mL/hr at 10/23/17 0421  . heparin 1,300 Units/hr (10/22/17 2133)    Assessment: 62 yof presented to the ED with leg swelling. 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 To start IV heparin. Baseline Hgb is WNL and platelets are slightly low. She is not on anticoagulation PTA.   Confirmatory heparin level undetectable, RN reports the patient has been bending arm and the IV has been beeping off and on this morning. Will give a small bolus and recheck level this evening. Previously therapeutic on this rate.  Goal of Therapy:  Heparin level 0.3-0.7 units/ml Monitor platelets by anticoagulation protocol: Yes   Plan:  Bolus heparin 2000 units Continue heparin gtt 1300 units/hr Check a 6 hr heparin level to confirm Daily heparin level and CBC  Likisha Alles L Stacie Knutzen 10/23/2017,1:42 PM

## 2017-10-23 NOTE — Progress Notes (Addendum)
Inpatient Diabetes Program Recommendations  AACE/ADA: New Consensus Statement on Inpatient Glycemic Control (2015)  Target Ranges:  Prepandial:   less than 140 mg/dL      Peak postprandial:   less than 180 mg/dL (1-2 hours)      Critically ill patients:  140 - 180 mg/dL   Lab Results  Component Value Date   GLUCAP 255 (H) 10/23/2017   HGBA1C 10.6 (H) 10/23/2017    Review of Glycemic ControlResults for EMMER, LILLIBRIDGE (MRN [id number]) as of 10/23/2017 12:00  Ref. Range 10/22/2017 21:44 10/22/2017 22:40 10/23/2017 06:38 10/23/2017 11:34  Glucose-Capillary Latest Ref Range: 70 - 99 mg/dL 240 (H) 225 (H) 197 (H) 255 (H)   Diabetes history: Type 2 DM  Outpatient Diabetes medications:  Basaglar 40 units q HS Current orders for Inpatient glycemic control:  Novolog moderate tid with meals and HS Inpatient Diabetes Program Recommendations:   Please restart a portion of home insulin.  Consider Lantus 25 units daily and Novolog meal coverage 4 units tid with meals.  Will talk to patient today regarding home DM management and A1C. Spoke with Dr. Scot Dock- orders received.   Thanks,  Adah Perl, RN, BC-ADM Inpatient Diabetes Coordinator Pager [phone] (8a-5p)  15:00- Spoke with patient regarding elevated A1C.  She states that this is an improvement from  past A1C's. She takes [employer] at home and states that she had reduced amount due to lower fasting blood sugars.  She only checks her blood sugars 3 times a week. We discussed that post-prandial blood sugars may be higher and may be contributing to higher blood sugars.  She has a DM MD with [facility], but has not been seen in more than a year.Her A1C then was 7.3%.  We discussed importance of glycemic control and potential need for insulin with meals.  Patient agreeable.  I also encouraged follow-up with her PCP.

## 2017-10-24 ENCOUNTER — Telehealth: Payer: Self-pay | Admitting: Vascular Surgery

## 2017-10-24 LAB — CBC
HCT: 40.7 % (ref 36.0–46.0)
Hemoglobin: 12.5 g/dL (ref 12.0–15.0)
MCH: 27.6 pg (ref 26.0–34.0)
MCHC: 30.7 g/dL (ref 30.0–36.0)
MCV: 89.8 fL (ref 78.0–100.0)
Platelets: 167 10*3/uL (ref 150–400)
RBC: 4.53 MIL/uL (ref 3.87–5.11)
RDW: 17 % — ABNORMAL HIGH (ref 11.5–15.5)
WBC: 9.2 10*3/uL (ref 4.0–10.5)

## 2017-10-24 LAB — GLUCOSE, CAPILLARY
Glucose-Capillary: 156 mg/dL — ABNORMAL HIGH (ref 70–99)
Glucose-Capillary: 232 mg/dL — ABNORMAL HIGH (ref 70–99)

## 2017-10-24 LAB — HEPARIN LEVEL (UNFRACTIONATED): Heparin Unfractionated: 0.43 IU/mL (ref 0.30–0.70)

## 2017-10-24 MED ORDER — HYDROCODONE-ACETAMINOPHEN 5-325 MG PO TABS: 1.0000 | ORAL_TABLET | Freq: Four times a day (QID) | ORAL | 0 refills | Status: DC | PRN

## 2017-10-24 MED ORDER — HYDROXYZINE HCL 10 MG PO TABS
10.0000 mg | ORAL_TABLET | Freq: Once | ORAL | Status: AC
  Administered 2017-10-24: 5 mg via ORAL
  Filled 2017-10-24: qty 1

## 2017-10-24 MED ORDER — RIVAROXABAN 20 MG PO TABS: 20.0000 mg | ORAL_TABLET | Freq: Every day | ORAL | Status: DC

## 2017-10-24 MED ORDER — RIVAROXABAN (XARELTO) VTE STARTER PACK (15 & 20 MG): ORAL_TABLET | ORAL | 0 refills | Status: DC

## 2017-10-24 MED ORDER — RIVAROXABAN 20 MG PO TABS: 20.0000 mg | ORAL_TABLET | Freq: Every day | ORAL | 4 refills | Status: DC

## 2017-10-24 MED ORDER — RIVAROXABAN 15 MG PO TABS
15.0000 mg | ORAL_TABLET | Freq: Two times a day (BID) | ORAL | Status: DC
  Administered 2017-10-24: 15 mg via ORAL
  Filled 2017-10-24: qty 1

## 2017-10-24 NOTE — Progress Notes (Signed)
ANTICOAGULATION CONSULT NOTE - Follow Up Consult  Pharmacy Consult for heparin Indication: DVT  Labs: Recent Labs    10/22/17 1711 10/22/17 2227  10/23/17 0331 10/23/17 0950 10/23/17 1937 10/24/17 0540  HGB 13.1  --   --  12.1  --   --  12.5  HCT 42.2  --   --  38.8  --   --  40.7  PLT 145*  --   --  148*  --   --  167  LABPROT  --  14.2  --   --   --   --   --   INR  --  1.11  --   --   --   --   --   HEPARINUNFRC  --   --    < > 0.42 <0.10* <0.10* 0.43  CREATININE 1.15*  --   --   --   --   --   --    < > = values in this interval not displayed.    Assessment/Plan:  67yo female therapeutic on heparin after rate increases. Will continue gtt at current rate and confirm stable with additional level.   Wynona Neat, PharmD, BCPS  10/24/2017,7:40 AM

## 2017-10-24 NOTE — Telephone Encounter (Signed)
sch appt 11/25/17 8am IVC/iliac 9am LE Venous  11/27/17 330pm p/o MD

## 2017-10-24 NOTE — Care Management Note (Signed)
Case Management Note Marvetta Gibbons RN, BSN Unit 4E-Case Manager [phone]  Patient Details  Name: Tonya Dougherty MRN: [id number] Date of Birth: 1950-05-17  Subjective/Objective:   Pt admitted with DVT                 Action/Plan: PTA pt lived at home- referral for Xarelto needs- per benefits check- copay should be less then $8.50- spoke with pt at bedside- per pt she has both Medicaid and Silver scripts- which covers what Medicaid doesn't- Xarelto is a preferred med under Medicaid- pt should pay no more than $8.50 however may be less- CM to follow for any further needs.   Expected Discharge Date:  10/24/17               Expected Discharge Plan:  Home/Self Care  In-House Referral:     Discharge planning Services  CM Consult, Medication Assistance  Post Acute Care Choice:    Choice offered to:     DME Arranged:    DME Agency:     HH Arranged:    HH Agency:     Status of Service:  Completed, signed off  If discussed at [employer] of Stay Meetings, dates discussed:    Discharge Disposition: home/self care   Additional Comments:  10/24/17- 1030- Terez Freimark RN, CM- pt for discharge later today after transition to Xarelto- pt provided 30 day free card to use on discharge with first script for starter pack- [facility] in [facility] to fill today.   Dawayne Patricia, RN 10/24/2017, 10:31 AM

## 2017-10-24 NOTE — Progress Notes (Addendum)
  Progress Note  10/24/2017 7:29 AM  Subjective:  LLE felt better and less swollen yesterday evening when she was OOB and walking   Vitals:   10/24/17 0016 10/24/17 0329  BP: (!) 115/58 117/77  Pulse: 85 99  Resp: 16 20  Temp: 98.3 F (36.8 C) 98.8 F (37.1 C)  SpO2: 96% 97%   Physical Exam: Lungs:  Non labored on RA Extremities:  Pitting edema LLE to level of knee; feet well perfused Abdomen:  Soft non tender Neurologic: A&O  CBC    Component Value Date/Time   WBC 9.2 10/24/2017 0540   RBC 4.53 10/24/2017 0540   HGB 12.5 10/24/2017 0540   HGB 5.9 (L) 03/27/2013 0836   HCT 40.7 10/24/2017 0540   HCT 18.9 (L) 03/27/2013 0836   PLT 167 10/24/2017 0540   PLT 258 03/27/2013 0836   MCV 89.8 10/24/2017 0540   MCV 86 03/27/2013 0836   MCH 27.6 10/24/2017 0540   MCHC 30.7 10/24/2017 0540   RDW 17.0 (H) 10/24/2017 0540   RDW 17.8 (H) 03/27/2013 0836   LYMPHSABS 1.8 10/22/2017 1711   LYMPHSABS 1.8 03/27/2013 0836   MONOABS 0.8 10/22/2017 1711   MONOABS 1.3 (H) 03/27/2013 0836   EOSABS 0.1 10/22/2017 1711   EOSABS 0.1 03/27/2013 0836   BASOSABS 0.1 10/22/2017 1711   BASOSABS 0.1 03/27/2013 0836    BMET    Component Value Date/Time   NA 135 10/22/2017 1711   NA 141 03/27/2013 0836   K 4.2 10/22/2017 1711   K 3.5 03/27/2013 0836   CL 102 10/22/2017 1711   CL 108 (H) 03/27/2013 0836   CO2 24 10/22/2017 1711   CO2 25 03/27/2013 0836   GLUCOSE 267 (H) 10/22/2017 1711   GLUCOSE 163 (H) 03/27/2013 0836   BUN 15 10/22/2017 1711   BUN 7 03/27/2013 0836   CREATININE 1.15 (H) 10/22/2017 1711   CREATININE 0.83 03/27/2013 0836   CALCIUM 9.0 10/22/2017 1711   CALCIUM 7.5 (L) 03/27/2013 0836   GFRNONAA 48 (L) 10/22/2017 1711   GFRNONAA >60 03/27/2013 0836   GFRAA 56 (L) 10/22/2017 1711   GFRAA >60 03/27/2013 0836    INR    Component Value Date/Time   INR 1.11 10/22/2017 2227   INR 1.3 03/20/2013 0504     Intake/Output Summary (Last 24 hours) at 10/24/2017  0729 Last data filed at 10/24/2017 9242 Gross per 24 hour  Intake 2778.24 ml  Output 1400 ml  Net 1378.24 ml    Assessment/Plan:  67 y.o. female with extensive DVT LLE  Symptoms improved with elevation over the past 24 hours Pharmacy consulted for transition from heparin to [place] assistance from DM coordinator  Probable discharge later today  Dagoberto Ligas, PA-C [facility] [phone] 10/24/2017 7:29 AM  I have interviewed the patient and examined the patient. I agree with the findings by the PA. For discharge today on Xerelto. I will follow if office closely.   Gae Gallop, MD [phone]

## 2017-10-24 NOTE — Discharge Summary (Signed)
Physician Discharge Summary   Patient ID: Tonya Dougherty [id number] 67 y.o. [birth year]  Admit date: 10/22/2017  Discharge date and time: 10/24/17   Admitting Physician: Angelia Mould, MD   Discharge Physician: same  Admission Diagnoses: May-Thurner syndrome [I87.1] Acute deep vein thrombosis (DVT) of iliac vein of left lower extremity [facility]) [I82.422]  Discharge Diagnoses: same  Admission Condition: fair  Discharged Condition: fair  Indication for Admission: extensive LLE DVT  Hospital Course: Tonya Dougherty is a 67 year old female who was admitted to the hospital on 10/22/2017 by Dr. Scot Dock due to extensive DVT of left lower extremity involving the common iliac vein based on duplex report.  She was started on IV heparin and educated on elevation of left lower extremity.  Thrombolysis versus anticoagulation was discussed with the patient.  Records were unable to be obtained in care everywhere however per patient she experienced a severe GI bleed in 2014 in [place] and thus chose to proceed with anticoagulation versus femoral lysis due to high risk of bleed.  Case management assisted with choice of anticoagulant and found that Xarelto will be affordable to the patient.  Diabetic coordinator was also consulted and gave recommendations which can be seen in the patient's chart.  Patient will follow up with her primary care provider in regards to management of her diabetes insulin-dependent diabetes.  Today pharmacy will convert patient from IV heparin to Xarelto.  She will be discharged on a Xarelto starter pack and then given prescriptions for Xarelto for a time period of at least 6 months.  She has been encouraged to continue elevation as well as compression.  She will follow-up with Dr. Scot Dock in several weeks and this will be arranged with a call from our office.  Discharge instructions were reviewed with the patient and she voices her understanding.  She will be discharged home this  morning/afternoon in stable condition.  Consults: None  Treatments: anticoagulation: IV heparin transitioned to Xarelto  Discharge Exam: see progress note 10/24/17 Vitals:   10/24/17 0329 10/24/17 0823  BP: 117/77 (!) 116/57  Pulse: 99 91  Resp: 20 19  Temp: 98.8 F (37.1 C) 98 F (36.7 C)  SpO2: 97% 95%     Disposition: Discharge disposition: 01-Home or Self Care       Patient Instructions:  Allergies as of 10/24/2017   No Known Allergies     Medication List    TAKE these medications   acetaminophen 500 MG tablet Commonly known as:  TYLENOL Take 500 mg by mouth every 6 (six) hours as needed for mild pain.   BASAGLAR KWIKPEN 100 UNIT/ML Sopn Inject 40 Units into the skin at bedtime.   carvedilol 3.125 MG tablet Commonly known as:  COREG Take 3.125 mg by mouth 2 (two) times daily with a meal.   citalopram 20 MG tablet Commonly known as:  CELEXA Take 20 mg by mouth daily.   diphenhydrAMINE 25 MG tablet Commonly known as:  BENADRYL Take 1 tablet (25 mg total) by mouth 3 (three) times daily. Take one tablet three times daily for two days   famotidine 20 MG tablet Commonly known as:  PEPCID Take 1 tablet (20 mg total) by mouth 2 (two) times daily. Take one tablet twice daily for two days   furosemide 20 MG tablet Commonly known as:  LASIX Take 20 mg by mouth 2 (two) times daily.   gabapentin 100 MG capsule Commonly known as:  NEURONTIN Take 200 mg by mouth at bedtime.  HYDROcodone-acetaminophen 5-325 MG tablet Commonly known as:  NORCO Take 1 tablet by mouth every 6 (six) hours as needed for moderate pain.   hydrOXYzine 10 MG tablet Commonly known as:  ATARAX/VISTARIL Take 0.5 tablets (5 mg total) by mouth every 8 (eight) hours as needed for itching.   MAG-CAPS PO Take 100 mg by mouth daily.   omeprazole 20 MG capsule Commonly known as:  PRILOSEC Take 40 mg by mouth daily.   predniSONE 20 MG tablet Commonly known as:  DELTASONE Take 2  tablets (40 mg total) by mouth daily with breakfast. For the next four days   Rivaroxaban 15 & 20 MG Tbpk Take as directed on package: Start with one 15mg  tablet by mouth twice a day with food. On Day 22, switch to one 20mg  tablet once a day with food.   rivaroxaban 20 MG Tabs tablet Commonly known as:  XARELTO Take 1 tablet (20 mg total) by mouth daily with supper.   spironolactone 25 MG tablet Commonly known as:  ALDACTONE Take 25 mg by mouth daily.      Activity: activity as tolerated; elevate LLE when sitting Diet: regular diet Wound Care: none needed  Follow-up with Dr. Scot Dock  Signed: Dagoberto Ligas 10/24/2017 9:31 AM

## 2017-10-24 NOTE — Progress Notes (Signed)
[place] for Xarelto Indication: DVT  No Known Allergies  Patient Measurements: Height: 5\' 2"  (157.5 cm) Weight: 245 lb (111.1 kg) IBW/kg (Calculated) : 50.1 Heparin Dosing Weight: 81.6kg  Vital Signs: Temp: 98.8 F (37.1 C) (06/27 0329) Temp Source: Oral (06/27 0329) BP: 117/77 (06/27 0329) Pulse Rate: 99 (06/27 0329)  Labs: Recent Labs    10/22/17 1711 10/22/17 2227  10/23/17 0331 10/23/17 0950 10/23/17 1937 10/24/17 0540  HGB 13.1  --   --  12.1  --   --  12.5  HCT 42.2  --   --  38.8  --   --  40.7  PLT 145*  --   --  148*  --   --  167  LABPROT  --  14.2  --   --   --   --   --   INR  --  1.11  --   --   --   --   --   HEPARINUNFRC  --   --    < > 0.42 <0.10* <0.10* 0.43  CREATININE 1.15*  --   --   --   --   --   --    < > = values in this interval not displayed.   Estimated Creatinine Clearance: 55.8 mL/min (A) (by C-G formula based on SCr of 1.15 mg/dL (H)).  Medical History: Past Medical History:  Diagnosis Date  . Arthritis   . Depression   . Diabetes mellitus without complication ([place])   . Hypertension     Medications:  Infusions:  . 0.9 % NaCl with KCl 20 mEq / L 75 mL/hr at 10/24/17 0736    Assessment: 50 yof presented to the ED with leg swelling. 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 IV heparin is now being transitioned to oral anticoagualtion. Hgb is WNL and platelets are slightly low. She is not on anticoagulation PTA.   Goal of Therapy:  Monitor platelets by anticoagulation protocol: Yes   Plan:  D/C heparin infusion Xarelto 15mg  BID x21 days then on 7/17 start Xarelto 20mg  QD Monitor renal function and for s/sx of bleeding  Georga Bora, PharmD Clinical Pharmacist 10/24/2017 8:55 AM Please check AMION for all [place] numbers

## 2017-10-24 NOTE — Plan of Care (Signed)

## 2017-10-24 NOTE — Progress Notes (Signed)
Prescriptions given and discharge instructions reviewed. Patient denies questions and agrees to return to hospital or call physician office immediately for any increase or change in symptoms related to DVT.  Patient left unit via wheelchair with volunteer in stable condition.

## 2017-10-25 ENCOUNTER — Other Ambulatory Visit: Payer: Self-pay

## 2017-10-25 DIAGNOSIS — I824Y2 Acute embolism and thrombosis of unspecified deep veins of left proximal lower extremity: Secondary | ICD-10-CM

## 2017-10-28 ENCOUNTER — Encounter (HOSPITAL_COMMUNITY): Payer: Self-pay

## 2017-10-28 ENCOUNTER — Ambulatory Visit (HOSPITAL_COMMUNITY)
Admission: EM | Admit: 2017-10-28 | Discharge: 2017-10-28 | Disposition: A | Discharge status: Home / Self Care | Payer: Medicare HMO | Attending: Family Medicine | Admitting: Family Medicine

## 2017-10-28 ENCOUNTER — Telehealth: Payer: Self-pay | Admitting: *Deleted

## 2017-10-28 ENCOUNTER — Other Ambulatory Visit: Payer: Self-pay | Admitting: *Deleted

## 2017-10-28 DIAGNOSIS — T45515A Adverse effect of anticoagulants, initial encounter: Secondary | ICD-10-CM | POA: Insufficient documentation

## 2017-10-28 DIAGNOSIS — R69 Illness, unspecified: Secondary | ICD-10-CM | POA: Diagnosis not present

## 2017-10-28 DIAGNOSIS — L270 Generalized skin eruption due to drugs and medicaments taken internally: Secondary | ICD-10-CM | POA: Diagnosis not present

## 2017-10-28 DIAGNOSIS — Z888 Allergy status to other drugs, medicaments and biological substances status: Secondary | ICD-10-CM | POA: Insufficient documentation

## 2017-10-28 DIAGNOSIS — E119 Type 2 diabetes mellitus without complications: Secondary | ICD-10-CM | POA: Insufficient documentation

## 2017-10-28 DIAGNOSIS — F329 Major depressive disorder, single episode, unspecified: Secondary | ICD-10-CM | POA: Diagnosis not present

## 2017-10-28 DIAGNOSIS — Z86718 Personal history of other venous thrombosis and embolism: Secondary | ICD-10-CM | POA: Diagnosis not present

## 2017-10-28 DIAGNOSIS — Z794 Long term (current) use of insulin: Secondary | ICD-10-CM | POA: Insufficient documentation

## 2017-10-28 DIAGNOSIS — T50905A Adverse effect of unspecified drugs, medicaments and biological substances, initial encounter: Secondary | ICD-10-CM | POA: Diagnosis not present

## 2017-10-28 DIAGNOSIS — I10 Essential (primary) hypertension: Secondary | ICD-10-CM | POA: Diagnosis not present

## 2017-10-28 DIAGNOSIS — Z79899 Other long term (current) drug therapy: Secondary | ICD-10-CM | POA: Diagnosis not present

## 2017-10-28 DIAGNOSIS — F17210 Nicotine dependence, cigarettes, uncomplicated: Secondary | ICD-10-CM | POA: Diagnosis not present

## 2017-10-28 DIAGNOSIS — R21 Rash and other nonspecific skin eruption: Secondary | ICD-10-CM | POA: Diagnosis present

## 2017-10-28 LAB — CBC
HCT: 40.4 % (ref 36.0–46.0)
HEMOGLOBIN: 12.6 g/dL (ref 12.0–15.0)
MCH: 27.6 pg (ref 26.0–34.0)
MCHC: 31.2 g/dL (ref 30.0–36.0)
MCV: 88.4 fL (ref 78.0–100.0)
PLATELETS: 226 10*3/uL (ref 150–400)
RBC: 4.57 MIL/uL (ref 3.87–5.11)
RDW: 17.5 % — ABNORMAL HIGH (ref 11.5–15.5)
WBC: 8.3 10*3/uL (ref 4.0–10.5)

## 2017-10-28 MED ORDER — APIXABAN 5 MG PO TABS: 5.0000 mg | ORAL_TABLET | Freq: Two times a day (BID) | ORAL | 5 refills | Status: DC

## 2017-10-28 MED ORDER — PREDNISONE 50 MG PO TABS: 50.0000 mg | ORAL_TABLET | Freq: Every day | ORAL | 0 refills | Status: DC

## 2017-10-28 NOTE — ED Triage Notes (Signed)
Pt presents with an allergic reaction to an unknown substance  That she believes to had been from a hospital visit from a week ago and swelling in left lower leg and ankle area.

## 2017-10-28 NOTE — ED Provider Notes (Addendum)
[place]   [id number] 10/28/17 Arrival Time: 4268  SUBJECTIVE: History from: patient. Opal Dinning is a 67 y.o. female complains of rash that started about a week ago.  Was recently hospitalized from 6/25 - 6/27 for DVT and was started on xarelto.  Patient was advised by PCP to come to Urgent Care to get an oral steroid.  PCP has xarelto listed as an allergy and has called in a different medication for patient at this time for DVT treatment. Next appointment on 11/25/17.  Localizes the rash to trunk.  Describes the rash as itchy as constant.  Has tried hydroxyzine without relief.  Symptoms are made worse with itching.  Denies similar symptoms in the past.  Denies fever, chills, nausea, vomiting, throat swelling, throat tingling, dyspnea, SOB, erythema, ecchymosis, effusion, weakness, numbness and tingling.          ROS: As per HPI.  Past Medical History:  Diagnosis Date  . Arthritis   . Depression   . Diabetes mellitus without complication ([place])   . Hypertension    Past Surgical History:  Procedure Laterality Date  . ANKLE SURGERY     Allergies  Allergen Reactions  . Xarelto [Rivaroxaban] Rash    Sever rash with itching   No current facility-administered medications on file prior to encounter.    Current Outpatient Medications on File Prior to Encounter  Medication Sig Dispense Refill  . acetaminophen (TYLENOL) 500 MG tablet Take 500 mg by mouth every 6 (six) hours as needed for mild pain.    Marland Kitchen apixaban (ELIQUIS) 5 MG TABS tablet Take 1 tablet (5 mg total) by mouth 2 (two) times daily. 60 tablet 5  . carvedilol (COREG) 3.125 MG tablet Take 3.125 mg by mouth 2 (two) times daily with a meal.    . citalopram (CELEXA) 20 MG tablet Take 20 mg by mouth daily.    . diphenhydrAMINE (BENADRYL) 25 MG tablet Take 1 tablet (25 mg total) by mouth 3 (three) times daily. Take one tablet three times daily for two days 10 tablet 0  . furosemide (LASIX) 20 MG tablet Take 20 mg by  mouth 2 (two) times daily.    Marland Kitchen gabapentin (NEURONTIN) 100 MG capsule Take 200 mg by mouth at bedtime.    Marland Kitchen HYDROcodone-acetaminophen (NORCO) 5-325 MG tablet Take 1 tablet by mouth every 6 (six) hours as needed for moderate pain. 12 tablet 0  . hydrOXYzine (ATARAX/VISTARIL) 10 MG tablet Take 0.5 tablets (5 mg total) by mouth every 8 (eight) hours as needed for itching. 10 tablet 0  . Insulin Glargine (BASAGLAR KWIKPEN) 100 UNIT/ML SOPN Inject 40 Units into the skin at bedtime.    . Magnesium Oxide (MAG-CAPS PO) Take 100 mg by mouth daily.    Marland Kitchen omeprazole (PRILOSEC) 20 MG capsule Take 40 mg by mouth daily.    Marland Kitchen spironolactone (ALDACTONE) 25 MG tablet Take 25 mg by mouth daily.    . famotidine (PEPCID) 20 MG tablet Take 1 tablet (20 mg total) by mouth 2 (two) times daily. Take one tablet twice daily for two days 10 tablet 0   Social History   Socioeconomic History  . Marital status: Divorced    Spouse name: Not on file  . Number of children: Not on file  . Years of education: Not on file  . Highest education level: Not on file  Occupational History  . Not on file  Social Needs  . Financial resource strain: Not on file  . Food  insecurity:    Worry: Not on file    Inability: Not on file  . Transportation needs:    Medical: Not on file    Non-medical: Not on file  Tobacco Use  . Smoking status: Current Every Day Smoker    Packs/day: 1.00    Types: Cigarettes  . Smokeless tobacco: Never Used  Substance and Sexual Activity  . Alcohol use: No  . Drug use: Not Currently  . Sexual activity: Not on file  Lifestyle  . Physical activity:    Days per week: Not on file    Minutes per session: Not on file  . Stress: Not on file  Relationships  . Social connections:    Talks on phone: Not on file    Gets together: Not on file    Attends religious service: Not on file    Active member of club or organization: Not on file    Attends meetings of clubs or organizations: Not on file     Relationship status: Not on file  . Intimate partner violence:    Fear of current or ex partner: Not on file    Emotionally abused: Not on file    Physically abused: Not on file    Forced sexual activity: Not on file  Other Topics Concern  . Not on file  Social History Narrative  . Not on file   History reviewed. No pertinent family history.  OBJECTIVE:  Vitals:   10/28/17 1731  BP: 113/70  Pulse: 91  Resp: 20  Temp: 98 F (36.7 C)  TempSrc: Oral  SpO2: 97%    General appearance: AOx3; in no acute distress.  Head: NCAT Lungs: CTA bilaterally Heart: Murmur present; Radial pulses 2+ bilaterally. Skin: warm and dry; erythematous macular rash diffuse about the abdomen and chest; blanches with pressure; left lower extremity swelling and erythema Neurologic: Ambulates without difficulty Psychological: alert and cooperative; normal mood and affect  ASSESSMENT & PLAN:  1. Adverse effect of drug, initial encounter     @NIMG @  Meds ordered this encounter  Medications  . predniSONE (DELTASONE) 50 MG tablet    Sig: Take 1 tablet (50 mg total) by mouth daily.    Dispense:  5 tablet    Refill:  0    Order Specific Question:   Supervising Provider    Answer:   Wynona Luna [[id number]]     Reviewed expectations re: course of current medical issues. Questions answered. Outlined signs and symptoms indicating need for more acute intervention. Patient verbalized understanding. After Visit Summary given.    Lestine Box, PA-C 10/28/17 [place], [place], PA-C 10/28/17 1828

## 2017-10-28 NOTE — Discharge Instructions (Addendum)
Blood tests ordered.  We will follow up with you if the tests come back abnormal  Prednisone prescribed.  Take as directed and to completion Continue hydroxyzine as needed for symptomatic relief Follow up with PCP if symptoms persists Return or go to the ED if you have any new or worsening symptoms

## 2017-10-28 NOTE — Telephone Encounter (Signed)
Patient called c/o severe rash and itching from head to toes, she was just rx'd Xarelto at discharge on 10-24-17. She was under Dr. 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 Per Dr. Trula Slade, patient will be switched from Xarelto to Eliquis and I have sent this Rx to [facility] per patient request. Dr. Trula Slade also suggested that the patient go to Urgent Care to possible get prednisone for severe itching. Patient states that she is not having any respiratory issues, just severe rash. Patient agrees with this plan of treatment.

## 2017-11-07 ENCOUNTER — Encounter (HOSPITAL_COMMUNITY): Payer: Self-pay

## 2017-11-07 ENCOUNTER — Ambulatory Visit (HOSPITAL_COMMUNITY)
Admission: EM | Admit: 2017-11-07 | Discharge: 2017-11-07 | Disposition: A | Discharge status: Home / Self Care | Payer: Medicare HMO | Attending: Family Medicine | Admitting: Family Medicine

## 2017-11-07 DIAGNOSIS — R21 Rash and other nonspecific skin eruption: Secondary | ICD-10-CM

## 2017-11-07 DIAGNOSIS — T7840XD Allergy, unspecified, subsequent encounter: Secondary | ICD-10-CM | POA: Diagnosis not present

## 2017-11-07 MED ORDER — PREDNISONE 50 MG PO TABS: 50.0000 mg | ORAL_TABLET | Freq: Every day | ORAL | 0 refills | Status: DC

## 2017-11-07 MED ORDER — CALAMINE EX LOTN: 1.0000 "application " | TOPICAL_LOTION | CUTANEOUS | 0 refills | Status: DC | PRN

## 2017-11-07 NOTE — ED Notes (Signed)
Provided blankets 

## 2017-11-07 NOTE — ED Triage Notes (Signed)
Pt presents with skin allergic reaction from unknown source

## 2017-11-07 NOTE — ED Provider Notes (Signed)
[place]   [id number] 11/07/17 Arrival Time: 1448  SUBJECTIVE:  Tonya Dougherty is a 67 y.o. female who presents with a rash that began 1-2 days ago.  Was recently hospitalized from 6/25 - 6/27 for DVT and was started on xarelto. Patient experienced a drug reaction to the xarelto.  She was seen at [facility] on 10/28/17 and treated with a short course of prednisone.  Vascular physician changed medication from xarelto to eliquis.  Patient reports temporary improvement in symptoms with prednisone, but have reoccurred.   Rash is diffuse about the body including neck, arms, trunk, and lower extremity.  Describes the rash as itchy and constant.  Has tried hydroxyzine with temporary improvement.  Symptoms are made worse with itching.  Denies similar symptoms in the past.  Denies fever, chills, nausea, vomiting, throat swelling, throat tingling, dyspnea, SOB, erythema, ecchymosis, effusion, weakness, numbness and tingling.       ROS: As per HPI.  Past Medical History:  Diagnosis Date  . Arthritis   . Depression   . Diabetes mellitus without complication ([place])   . Hypertension    Past Surgical History:  Procedure Laterality Date  . ANKLE SURGERY     Allergies  Allergen Reactions  . Xarelto [Rivaroxaban] Rash    Sever rash with itching   No current facility-administered medications on file prior to encounter.    Current Outpatient Medications on File Prior to Encounter  Medication Sig Dispense Refill  . acetaminophen (TYLENOL) 500 MG tablet Take 500 mg by mouth every 6 (six) hours as needed for mild pain.    Marland Kitchen apixaban (ELIQUIS) 5 MG TABS tablet Take 1 tablet (5 mg total) by mouth 2 (two) times daily. 60 tablet 5  . carvedilol (COREG) 3.125 MG tablet Take 3.125 mg by mouth 2 (two) times daily with a meal.    . citalopram (CELEXA) 20 MG tablet Take 20 mg by mouth daily.    . diphenhydrAMINE (BENADRYL) 25 MG tablet Take 1 tablet (25 mg total) by mouth 3 (three) times daily. Take one  tablet three times daily for two days 10 tablet 0  . famotidine (PEPCID) 20 MG tablet Take 1 tablet (20 mg total) by mouth 2 (two) times daily. Take one tablet twice daily for two days 10 tablet 0  . furosemide (LASIX) 20 MG tablet Take 20 mg by mouth 2 (two) times daily.    Marland Kitchen gabapentin (NEURONTIN) 100 MG capsule Take 200 mg by mouth at bedtime.    Marland Kitchen HYDROcodone-acetaminophen (NORCO) 5-325 MG tablet Take 1 tablet by mouth every 6 (six) hours as needed for moderate pain. 12 tablet 0  . hydrOXYzine (ATARAX/VISTARIL) 10 MG tablet Take 0.5 tablets (5 mg total) by mouth every 8 (eight) hours as needed for itching. 10 tablet 0  . Insulin Glargine (BASAGLAR KWIKPEN) 100 UNIT/ML SOPN Inject 40 Units into the skin at bedtime.    . Magnesium Oxide (MAG-CAPS PO) Take 100 mg by mouth daily.    Marland Kitchen omeprazole (PRILOSEC) 20 MG capsule Take 40 mg by mouth daily.    Marland Kitchen spironolactone (ALDACTONE) 25 MG tablet Take 25 mg by mouth daily.     Social History   Socioeconomic History  . Marital status: Divorced    Spouse name: Not on file  . Number of children: Not on file  . Years of education: Not on file  . Highest education level: Not on file  Occupational History  . Not on file  Social Needs  . Financial  resource strain: Not on file  . Food insecurity:    Worry: Not on file    Inability: Not on file  . Transportation needs:    Medical: Not on file    Non-medical: Not on file  Tobacco Use  . Smoking status: Current Every Day Smoker    Packs/day: 1.00    Types: Cigarettes  . Smokeless tobacco: Never Used  Substance and Sexual Activity  . Alcohol use: No  . Drug use: Not Currently  . Sexual activity: Not on file  Lifestyle  . Physical activity:    Days per week: Not on file    Minutes per session: Not on file  . Stress: Not on file  Relationships  . Social connections:    Talks on phone: Not on file    Gets together: Not on file    Attends religious service: Not on file    Active member of  club or organization: Not on file    Attends meetings of clubs or organizations: Not on file    Relationship status: Not on file  . Intimate partner violence:    Fear of current or ex partner: Not on file    Emotionally abused: Not on file    Physically abused: Not on file    Forced sexual activity: Not on file  Other Topics Concern  . Not on file  Social History Narrative  . Not on file   History reviewed. No pertinent family history.  OBJECTIVE: Vitals:   11/07/17 1503  BP: (!) 117/55  Pulse: 77  Resp: 20  Temp: 97.9 F (36.6 C)  TempSrc: Temporal  SpO2: 97%    General appearance: alert; no distress Lungs: clear to auscultation bilaterally Heart: Systolic murmur present.  Radial pulse 2+ bilaterally Extremities: no edema Skin: warm and dry; erythematous macular rash diffuse about the neck, anterior trunk, lateral flexor areas of elbows, and right LE; blanches with pressure; left lower extremity swelling and erythema Psychological: alert and cooperative; normal mood and affect  ASSESSMENT & PLAN:  1. Allergic reaction, subsequent encounter    Discussed patient case with Dr. Mannie Stabile.  We will try another short course of prednisone and have her follow up with vascular specialist for assessment of allergic reaction to eliquis.  Patient will follow up with PCP if symptoms persists.  Given return and ER precautions.    Meds ordered this encounter  Medications  . predniSONE (DELTASONE) 50 MG tablet    Sig: Take 1 tablet (50 mg total) by mouth daily.    Dispense:  5 tablet    Refill:  0    Order Specific Question:   Supervising Provider    Answer:   Wynona Luna [phone]  . calamine lotion    Sig: Apply 1 application topically as needed for itching.    Dispense:  120 mL    Refill:  0    Order Specific Question:   Supervising Provider    Answer:   Wynona Luna [[id number]]   Prednisone prescribed.  Take as directed and to completion Calamine lotion  prescribed.  Used as needed for symptomatic relief Continue OTC medications as needed for symptomatic relief Follow up with vascular or cardiology regarding medication reaction Follow up with PCP if symptoms persist Return or go to the ED if you have any new or worsening symptoms  Reviewed expectations re: course of current medical issues. Questions answered. Outlined signs and symptoms indicating need for more acute intervention. Patient verbalized understanding.  After Visit Summary given.   Lestine Box, PA-C 11/07/17 2058

## 2017-11-07 NOTE — Discharge Instructions (Addendum)
Prednisone prescribed.  Take as directed and to completion Calamine lotion prescribed.  Used as needed for symptomatic relief Continue OTC medications as needed for symptomatic relief Follow up with vascular or cardiology regarding medication reaction Follow up with PCP if symptoms persist Return or go to the ED if you have any new or worsening symptoms

## 2017-11-25 ENCOUNTER — Ambulatory Visit (HOSPITAL_COMMUNITY)
Admission: RE | Admit: 2017-11-25 | Discharge: 2017-11-25 | Disposition: A | Discharge status: Home / Self Care | Payer: Medicare HMO | Source: Ambulatory Visit | Attending: Vascular Surgery | Admitting: Vascular Surgery

## 2017-11-25 ENCOUNTER — Ambulatory Visit (INDEPENDENT_AMBULATORY_CARE_PROVIDER_SITE_OTHER)
Admission: RE | Admit: 2017-11-25 | Discharge: 2017-11-25 | Disposition: A | Discharge status: Home / Self Care | Payer: Medicare HMO | Source: Ambulatory Visit | Attending: Vascular Surgery | Admitting: Vascular Surgery

## 2017-11-25 DIAGNOSIS — I824Y2 Acute embolism and thrombosis of unspecified deep veins of left proximal lower extremity: Secondary | ICD-10-CM

## 2017-11-27 ENCOUNTER — Other Ambulatory Visit: Payer: Self-pay

## 2017-11-27 ENCOUNTER — Ambulatory Visit (INDEPENDENT_AMBULATORY_CARE_PROVIDER_SITE_OTHER): Payer: Medicare HMO | Admitting: Vascular Surgery

## 2017-11-27 ENCOUNTER — Encounter: Payer: Self-pay | Admitting: Vascular Surgery

## 2017-11-27 VITALS — BP 108/66 | HR 78 | Temp 97.4°F | Resp 18 | Ht 64.0 in | Wt 242.0 lb

## 2017-11-27 DIAGNOSIS — I824Y2 Acute embolism and thrombosis of unspecified deep veins of left proximal lower extremity: Secondary | ICD-10-CM | POA: Diagnosis not present

## 2017-11-27 NOTE — Progress Notes (Signed)
Patient name: Dalphine Cowie MRN: [id number] DOB: 1950-06-20 Sex: female  REASON FOR VISIT:   Follow-up of extensive left lower extremity DVT.  HPI:   Cassidie Veiga is a pleasant 67 y.o. female who I saw in consultation on 10/22/2017 with an extensive left lower extremity DVT.  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  We discussed potential thrombolysis but she had previous bleeding problems in 2014 and was reluctant to consider an aggressive approach.  She was admitted initially for intravenous heparin and leg elevation.  She was discharged on Xarelto and comes in for a one-month follow-up visit.  She describes some aching pain in her legs that is aggravated by standing and relieved with elevation.  She is also had some swelling.  The swelling worsens during the day.  In addition she has developed psoriasis on her left leg which she says she gets quite a bit.  She denies any chest pain or shortness of breath.  Current Outpatient Medications  Medication Sig Dispense Refill  . acetaminophen (TYLENOL) 500 MG tablet Take 500 mg by mouth every 6 (six) hours as needed for mild pain.    Marland Kitchen apixaban (ELIQUIS) 5 MG TABS tablet Take 1 tablet (5 mg total) by mouth 2 (two) times daily. 60 tablet 5  . B-D ULTRAFINE III SHORT PEN 31G X 8 MM MISC USE AS DIRECTED TO INJECT LANTUS DAILY  3  . calamine lotion Apply 1 application topically as needed for itching. 120 mL 0  . carvedilol (COREG) 3.125 MG tablet Take 3.125 mg by mouth 2 (two) times daily with a meal.    . citalopram (CELEXA) 20 MG tablet Take 20 mg by mouth daily.    . [facility] CALAMINE PLUS 1-8 % LOTN APPLY 1 APPLICATION TOPICALLY AS NEEDED FOR ITCHING.  0  . diphenhydrAMINE (BENADRYL) 25 MG tablet Take 1 tablet (25 mg total) by mouth 3 (three) times daily. Take one tablet three times daily for two days 10 tablet 0  . furosemide (LASIX) 20 MG tablet Take 20 mg by mouth 2 (two) times daily.    Marland Kitchen  gabapentin (NEURONTIN) 100 MG capsule Take 200 mg by mouth at bedtime.    Marland Kitchen HYDROcodone-acetaminophen (NORCO) 5-325 MG tablet Take 1 tablet by mouth every 6 (six) hours as needed for moderate pain. 12 tablet 0  . hydrOXYzine (ATARAX/VISTARIL) 10 MG tablet Take 0.5 tablets (5 mg total) by mouth every 8 (eight) hours as needed for itching. 10 tablet 0  . Insulin Glargine (BASAGLAR KWIKPEN) 100 UNIT/ML SOPN Inject 40 Units into the skin at bedtime.    . Magnesium Oxide (MAG-CAPS PO) Take 100 mg by mouth daily.    Marland Kitchen omeprazole (PRILOSEC) 20 MG capsule Take 40 mg by mouth daily.    Marland Kitchen spironolactone (ALDACTONE) 25 MG tablet Take 25 mg by mouth daily.    . famotidine (PEPCID) 20 MG tablet Take 1 tablet (20 mg total) by mouth 2 (two) times daily. Take one tablet twice daily for two days 10 tablet 0  . predniSONE (DELTASONE) 50 MG tablet Take 1 tablet (50 mg total) by mouth daily. (Patient not taking: Reported on 11/27/2017) 5 tablet 0   No current facility-administered medications for this visit.     REVIEW OF SYSTEMS:  [X]  denotes positive finding, [ ]  denotes negative finding Vascular    Leg swelling    Cardiac    Chest pain or chest pressure:  Shortness of breath upon exertion:    Short of breath when lying flat:    Irregular heart rhythm:    Constitutional    Fever or chills:     PHYSICAL EXAM:   Vitals:   11/27/17 1550  BP: 108/66  Pulse: 78  Resp: 18  Temp: (!) 97.4 F (36.3 C)  TempSrc: Oral  SpO2: 96%  Weight: 242 lb (109.8 kg)  Height: 5\' 4"  (1.626 m)    GENERAL: The patient is a well-nourished female, in no acute distress. The vital signs are documented above. CARDIOVASCULAR: There is a regular rate and rhythm. PULMONARY: There is good air exchange bilaterally without wheezing or rales. VASCULAR: She has brisk Doppler signals in the left foot in the dorsalis pedis and posterior tibial positions. She has moderate left lower extremity swelling up to her  groin. EXTREMITIES: She has psoriasis of the skin on the left leg.  DATA:   VENOUS DUPLEX: I reviewed her venous duplex scan from yesterday.  The patient has DVT involving the common femoral vein femoral vein and popliteal vein.  This is essentially unchanged from her previous exam.  MEDICAL ISSUES:   EXTENSIVE LEFT LOWER EXTREMITY DVT: She continues her Eliquis.  I suspect we will need to continue this or 6 months or potentially longer.  Her duplex scan shows no resolution of her extensive DVT of the left lower extremity.  We have discussed the importance of intermittent leg elevation and the proper positioning for this.  In addition I have written her prescription for a knee-high compression stocking with a mild gradient to start.  Ultimately we can try to get her entire compression stockings.  I plan on seeing her back in 2 months.  She knows to call sooner if she has problems.  I do not think we need a follow-up duplex scan at that time.  Deitra Mayo [facility] [phone]

## 2017-12-10 ENCOUNTER — Other Ambulatory Visit: Payer: Self-pay

## 2017-12-10 ENCOUNTER — Emergency Department (HOSPITAL_COMMUNITY)
Admission: EM | Admit: 2017-12-10 | Discharge: 2017-12-11 | Disposition: A | Discharge status: Home / Self Care | Payer: Medicare HMO | Attending: Emergency Medicine | Admitting: Emergency Medicine

## 2017-12-10 ENCOUNTER — Encounter (HOSPITAL_COMMUNITY): Payer: Self-pay | Admitting: Emergency Medicine

## 2017-12-10 DIAGNOSIS — Z79899 Other long term (current) drug therapy: Secondary | ICD-10-CM | POA: Insufficient documentation

## 2017-12-10 DIAGNOSIS — E119 Type 2 diabetes mellitus without complications: Secondary | ICD-10-CM | POA: Diagnosis not present

## 2017-12-10 DIAGNOSIS — Z7901 Long term (current) use of anticoagulants: Secondary | ICD-10-CM | POA: Insufficient documentation

## 2017-12-10 DIAGNOSIS — K649 Unspecified hemorrhoids: Secondary | ICD-10-CM

## 2017-12-10 DIAGNOSIS — K602 Anal fissure, unspecified: Secondary | ICD-10-CM

## 2017-12-10 DIAGNOSIS — F17210 Nicotine dependence, cigarettes, uncomplicated: Secondary | ICD-10-CM | POA: Insufficient documentation

## 2017-12-10 DIAGNOSIS — I10 Essential (primary) hypertension: Secondary | ICD-10-CM | POA: Diagnosis not present

## 2017-12-10 DIAGNOSIS — K625 Hemorrhage of anus and rectum: Secondary | ICD-10-CM | POA: Diagnosis not present

## 2017-12-10 DIAGNOSIS — R69 Illness, unspecified: Secondary | ICD-10-CM | POA: Diagnosis not present

## 2017-12-10 HISTORY — DX: Gastrointestinal hemorrhage, unspecified: K92.2

## 2017-12-10 LAB — COMPREHENSIVE METABOLIC PANEL
ALK PHOS: 105 U/L (ref 38–126)
ALT: 18 U/L (ref 0–44)
AST: 35 U/L (ref 15–41)
Albumin: 3.1 g/dL — ABNORMAL LOW (ref 3.5–5.0)
Anion gap: 14 (ref 5–15)
BILIRUBIN TOTAL: 0.7 mg/dL (ref 0.3–1.2)
BUN: 13 mg/dL (ref 8–23)
CALCIUM: 9.6 mg/dL (ref 8.9–10.3)
CHLORIDE: 107 mmol/L (ref 98–111)
CO2: 20 mmol/L — ABNORMAL LOW (ref 22–32)
CREATININE: 0.99 mg/dL (ref 0.44–1.00)
GFR calc Af Amer: >60 mL/min (ref >60)
GFR, EST NON AFRICAN AMERICAN: 58 mL/min — AB (ref >60)
Glucose, Bld: 274 mg/dL — ABNORMAL HIGH (ref 70–99)
Potassium: 4.5 mmol/L (ref 3.5–5.1)
Sodium: 141 mmol/L (ref 135–145)
Total Protein: 7.4 g/dL (ref 6.5–8.1)

## 2017-12-10 LAB — CBC
HCT: 40 % (ref 36.0–46.0)
Hemoglobin: 11.8 g/dL — ABNORMAL LOW (ref 12.0–15.0)
MCH: 26.8 pg (ref 26.0–34.0)
MCHC: 29.5 g/dL — ABNORMAL LOW (ref 30.0–36.0)
MCV: 90.9 fL (ref 78.0–100.0)
PLATELETS: 179 10*3/uL (ref 150–400)
RBC: 4.4 MIL/uL (ref 3.87–5.11)
RDW: 16.6 % — AB (ref 11.5–15.5)
WBC: 7.3 10*3/uL (ref 4.0–10.5)

## 2017-12-10 LAB — ABO/RH: ABO/RH(D): A POS

## 2017-12-10 LAB — TYPE AND SCREEN
ABO/RH(D): A POS
Antibody Screen: NEGATIVE

## 2017-12-10 NOTE — ED Triage Notes (Signed)
Pt reports she had lower abd cramping earlier this evening, went to wipe and noticed bright red blood. Pt on Eliquis.

## 2017-12-11 NOTE — ED Notes (Signed)
Patient ambulatory to bathroom with steady gait at this time 

## 2017-12-11 NOTE — ED Provider Notes (Signed)
[facility] EMERGENCY DEPARTMENT Provider Note  CSN: [id number] Arrival date & time: 12/10/17 2041  Chief Complaint(s) Rectal Bleeding  HPI Tonya Dougherty is a 67 y.o. female with a history of hypertension, diabetes, new DVT currently on Eliquis who presents to the emergency department with bright red blood per rectum noted during bowel movements.  Patient reported that she has a history of upper GI bleed several years ago however had to be placed on Eliquis due to the DVT.  Patient endorses having hemorrhoids and believes of bright red blood is attributed to that.  States that she had several episodes of bright red blood per rectum today however they have been improving.  She denies any melena.  No abdominal pain.  No fatigue or shortness of breath.  HPI  Past Medical History Past Medical History:  Diagnosis Date  . Arthritis   . Depression   . Diabetes mellitus without complication ([place])   . GI bleed   . Hypertension    Patient Active Problem List   Diagnosis Date Noted  . DVT (deep venous thrombosis) ([place]) 10/22/2017   Home Medication(s) Prior to Admission medications   Medication Sig Start Date End Date Taking? Authorizing Provider  acetaminophen (TYLENOL) 500 MG tablet Take 500 mg by mouth every 6 (six) hours as needed for mild pain.   Yes [provider]  apixaban (ELIQUIS) 5 MG TABS tablet Take 1 tablet (5 mg total) by mouth 2 (two) times daily. 10/28/17  Yes Serafina Mitchell, MD  carvedilol (COREG) 3.125 MG tablet Take 3.125 mg by mouth 2 (two) times daily with a meal.   Yes [provider]  citalopram (CELEXA) 20 MG tablet Take 20 mg by mouth daily.   Yes [provider]  furosemide (LASIX) 20 MG tablet Take 20 mg by mouth 2 (two) times daily.   Yes [provider]  gabapentin (NEURONTIN) 100 MG capsule Take 200 mg by mouth at bedtime.   Yes [provider]  Insulin Glargine (BASAGLAR KWIKPEN) 100 UNIT/ML SOPN  Inject 40 Units into the skin at bedtime. 10/11/17  Yes [provider]  Magnesium Oxide (MAG-CAPS PO) Take 100 mg by mouth daily.   Yes [provider]  omeprazole (PRILOSEC) 20 MG capsule Take 40 mg by mouth daily.   Yes [provider]  spironolactone (ALDACTONE) 25 MG tablet Take 25 mg by mouth daily.   Yes [provider]  B-D ULTRAFINE III SHORT PEN 31G X 8 MM MISC USE AS DIRECTED TO INJECT LANTUS DAILY 10/24/17   [provider]  calamine lotion Apply 1 application topically as needed for itching. Patient not taking: Reported on 12/11/2017 11/07/17   Wurst, [place], PA-C  diphenhydrAMINE (BENADRYL) 25 MG tablet Take 1 tablet (25 mg total) by mouth 3 (three) times daily. Take one tablet three times daily for two days Patient not taking: Reported on 12/11/2017 02/03/16   Carmin Muskrat, MD  famotidine (PEPCID) 20 MG tablet Take 1 tablet (20 mg total) by mouth 2 (two) times daily. Take one tablet twice daily for two days Patient not taking: Reported on 12/11/2017 02/03/16 12/11/25  Carmin Muskrat, MD  HYDROcodone-acetaminophen [facility]) 5-325 MG tablet Take 1 tablet by mouth every 6 (six) hours as needed for moderate pain. Patient not taking: Reported on 12/11/2017 10/24/17   Dagoberto Ligas, PA-C  hydrOXYzine (ATARAX/VISTARIL) 10 MG tablet Take 0.5 tablets (5 mg total) by mouth every 8 (eight) hours as needed for itching. Patient not taking:  Reported on 12/11/2017 02/03/16   Carmin Muskrat, MD  predniSONE (DELTASONE) 50 MG tablet Take 1 tablet (50 mg total) by mouth daily. Patient not taking: Reported on 11/27/2017 11/07/17   Lestine Box, PA-C                                                                                                                                    Past Surgical History Past Surgical History:  Procedure Laterality Date  . ANKLE SURGERY     Family History No family history on file.  Social History Social History    Tobacco Use  . Smoking status: Current Every Day Smoker    Packs/day: 0.50    Types: Cigarettes  . Smokeless tobacco: Never Used  Substance Use Topics  . Alcohol use: No  . Drug use: Not Currently   Allergies Xarelto [rivaroxaban]  Review of Systems Review of Systems All other systems are reviewed and are negative for acute change except as noted in the HPI  Physical Exam Vital Signs  I have reviewed the triage vital signs BP (!) 126/54   Pulse 91   Temp 98.7 F (37.1 C) (Oral)   Resp 16   Ht 5\' 4"  (1.626 m)   Wt 108.9 kg   SpO2 99%   BMI 41.20 kg/m   Physical Exam  Constitutional: She is oriented to person, place, and time. She appears well-developed and well-nourished. No distress.  HENT:  Head: Normocephalic and atraumatic.  Right Ear: External ear normal.  Left Ear: External ear normal.  Nose: Nose normal.  Eyes: Conjunctivae and EOM are normal. No scleral icterus.  Neck: Normal range of motion and phonation normal.  Cardiovascular: Normal rate and regular rhythm.  Pulmonary/Chest: Effort normal. No stridor. No respiratory distress.  Abdominal: She exhibits no distension.  Genitourinary: Rectal exam shows external hemorrhoid (irritated; hemostatic) and fissure.  Musculoskeletal: Normal range of motion. She exhibits no edema.  Neurological: She is alert and oriented to person, place, and time.  Skin: She is not diaphoretic.  Psychiatric: She has a normal mood and affect. Her behavior is normal.  Vitals reviewed.   ED Results and Treatments Labs (all labs ordered are listed, but only abnormal results are displayed) Labs Reviewed  COMPREHENSIVE METABOLIC PANEL - Abnormal; Notable for the following components:      Result Value   CO2 20 (*)    Glucose, Bld 274 (*)    Albumin 3.1 (*)    GFR calc non Af Amer 58 (*)    All other components within normal limits  CBC - Abnormal; Notable for the following components:   Hemoglobin 11.8 (*)    MCHC 29.5 (*)     RDW 16.6 (*)    All other components within normal limits  POC OCCULT BLOOD, ED  TYPE AND SCREEN  ABO/RH  EKG  EKG Interpretation  Date/Time:    Ventricular Rate:    PR Interval:    QRS Duration:   QT Interval:    QTC Calculation:   R Axis:     Text Interpretation:        Radiology No results found. Pertinent labs & imaging results that were available during my care of the patient were reviewed by me and considered in my medical decision making (see chart for details).  Medications Ordered in ED Medications - No data to display                                                                                                                                  Procedures Procedures  (including critical care time)  Medical Decision Making / ED Course I have reviewed the nursing notes for this encounter and the patient's prior records (if available in EHR or on provided paperwork).    Bright red blood per rectum which appears to be due to a bleeding external hemorrhoid.  Currently hemostatic.  Patient also with anal fissure adjacent to the hemorrhoid.  This is also hemostatic.  There is no active bleeding.  She denies any melena suspicious for upper GI bleed.  Screening labs obtained in triage revealed stable hemoglobin.  Recommended close monitoring and patient given strict return precautions.  The patient appears reasonably screened and/or stabilized for discharge and I doubt any other medical condition or other [facility] requiring further screening, evaluation, or treatment in the ED at this time prior to discharge.  The patient is safe for discharge with strict return precautions.   Final Clinical Impression(s) / ED Diagnoses Final diagnoses:  Bleeding hemorrhoid  Anal fissure   Disposition: Discharge  Condition: Good  I have discussed the results, Dx  and Tx plan with the patient who expressed understanding and agree(s) with the plan. Discharge instructions discussed at great length. The patient was given strict return precautions who verbalized understanding of the instructions. No further questions at time of discharge.    ED Discharge Orders    None       Follow Up: Primary care provider  Schedule an appointment as soon as possible for a visit        This chart was dictated using voice recognition software.  Despite best efforts to proofread,  errors can occur which can change the documentation meaning.   Fatima Blank, MD 12/11/17 [phone]

## 2017-12-11 NOTE — ED Notes (Signed)
Patient verbalizes understanding of discharge instructions. Opportunity for questioning and answers were provided. Armband removed by staff, pt discharged from ED ambulatory.   

## 2017-12-11 NOTE — ED Notes (Signed)
ED Provider at bedside. 

## 2018-01-29 ENCOUNTER — Ambulatory Visit (INDEPENDENT_AMBULATORY_CARE_PROVIDER_SITE_OTHER): Payer: Medicare HMO | Admitting: Vascular Surgery

## 2018-01-29 ENCOUNTER — Other Ambulatory Visit: Payer: Self-pay

## 2018-01-29 ENCOUNTER — Encounter: Payer: Self-pay | Admitting: Vascular Surgery

## 2018-01-29 VITALS — BP 128/66 | HR 79 | Temp 96.5°F | Resp 16 | Ht 64.0 in | Wt 237.0 lb

## 2018-01-29 DIAGNOSIS — I824Y2 Acute embolism and thrombosis of unspecified deep veins of left proximal lower extremity: Secondary | ICD-10-CM | POA: Diagnosis not present

## 2018-01-29 MED ORDER — APIXABAN 5 MG PO TABS: 5.0000 mg | ORAL_TABLET | Freq: Two times a day (BID) | ORAL | 2 refills | Status: DC

## 2018-01-29 MED ORDER — APIXABAN 2.5 MG PO TABS: 5.0000 mg | ORAL_TABLET | Freq: Two times a day (BID) | ORAL | 2 refills | Status: DC

## 2018-01-29 NOTE — Progress Notes (Signed)
Patient name: Tonya Dougherty MRN: [id number] DOB: 1950-11-22 Sex: female  REASON FOR VISIT:   Follow-up of extensive left lower extremity DVT.  HPI:   Tonya Dougherty is a pleasant 67 y.o. female who I last saw on 11/27/2017.  She was admitted to the hospital on 10/22/2017 with an extensive left lower extremity DVT.  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  We discussed potential thrombolysis however she had a previous bleeding problem in 2014 and was reluctant to consider an aggressive approach.  She was placed on intravenous heparin initially and then discharged on oral anticoagulation.  When I saw her last her venous duplex scan showed extensive involvement of the common femoral vein femoral vein and popliteal vein.  She was maintained on Eliquis.  I felt that she would likely need at least 6 months of this or potentially longer.  She comes in for a 92-month follow-up study.  Since I saw her last, she states that her leg pain on the left has improved significantly.  In addition her swelling has improved.  She has been elevating her leg.  She remains on Eliquis.  She denies any chest pain or shortness of breath.  Past Medical History:  Diagnosis Date  . Arthritis   . Depression   . Diabetes mellitus without complication ([place])   . GI bleed   . Hypertension     History reviewed. No pertinent family history.  SOCIAL HISTORY: Social History   Tobacco Use  . Smoking status: Current Every Day Smoker    Packs/day: 0.50    Types: Cigarettes  . Smokeless tobacco: Never Used  Substance Use Topics  . Alcohol use: No    Allergies  Allergen Reactions  . Xarelto [Rivaroxaban] Rash    Sever rash with itching    Current Outpatient Medications  Medication Sig Dispense Refill  . acetaminophen (TYLENOL) 500 MG tablet Take 500 mg by mouth every 6 (six) hours as needed for mild pain.    Marland Kitchen apixaban (ELIQUIS) 5 MG TABS tablet Take 1 tablet (5 mg total) by mouth 2 (two) times daily.  60 tablet 5  . B-D ULTRAFINE III SHORT PEN 31G X 8 MM MISC USE AS DIRECTED TO INJECT LANTUS DAILY  3  . calamine lotion Apply 1 application topically as needed for itching. 120 mL 0  . carvedilol (COREG) 3.125 MG tablet Take 3.125 mg by mouth 2 (two) times daily with a meal.    . citalopram (CELEXA) 20 MG tablet Take 20 mg by mouth daily.    . diphenhydrAMINE (BENADRYL) 25 MG tablet Take 1 tablet (25 mg total) by mouth 3 (three) times daily. Take one tablet three times daily for two days 10 tablet 0  . famotidine (PEPCID) 20 MG tablet Take 1 tablet (20 mg total) by mouth 2 (two) times daily. Take one tablet twice daily for two days 10 tablet 0  . furosemide (LASIX) 20 MG tablet Take 20 mg by mouth 2 (two) times daily.    Marland Kitchen gabapentin (NEURONTIN) 100 MG capsule Take 200 mg by mouth at bedtime.    . hydrOXYzine (ATARAX/VISTARIL) 10 MG tablet Take 0.5 tablets (5 mg total) by mouth every 8 (eight) hours as needed for itching. 10 tablet 0  . Insulin Glargine (BASAGLAR KWIKPEN) 100 UNIT/ML SOPN Inject 40 Units into the skin at bedtime.    . Magnesium Oxide (MAG-CAPS PO) Take 100 mg by mouth daily.    Marland Kitchen omeprazole (PRILOSEC) 20 MG capsule Take 40  mg by mouth daily.    Marland Kitchen spironolactone (ALDACTONE) 25 MG tablet Take 25 mg by mouth daily.    Marland Kitchen HYDROcodone-acetaminophen (NORCO) 5-325 MG tablet Take 1 tablet by mouth every 6 (six) hours as needed for moderate pain. (Patient not taking: Reported on 01/29/2018) 12 tablet 0  . predniSONE (DELTASONE) 50 MG tablet Take 1 tablet (50 mg total) by mouth daily. (Patient not taking: Reported on 01/29/2018) 5 tablet 0   No current facility-administered medications for this visit.     REVIEW OF SYSTEMS:  [X]  denotes positive finding, [ ]  denotes negative finding Cardiac  Comments:  Chest pain or chest pressure:    Shortness of breath upon exertion:    Short of breath when lying flat:    Irregular heart rhythm:        Vascular    Pain in calf, thigh, or hip  brought on by ambulation:    Pain in feet at night that wakes you up from your sleep:     Blood clot in your veins:    Leg swelling:         Pulmonary    Oxygen at home:    Productive cough:     Wheezing:         Neurologic    Sudden weakness in arms or legs:     Sudden numbness in arms or legs:     Sudden onset of difficulty speaking or slurred speech:    Temporary loss of vision in one eye:     Problems with dizziness:         Gastrointestinal    Blood in stool:     Vomited blood:         Genitourinary    Burning when urinating:     Blood in urine:        Psychiatric    Major depression:         Hematologic    Bleeding problems:    Problems with blood clotting too easily:        Skin    Rashes or ulcers:        Constitutional    Fever or chills:     PHYSICAL EXAM:   Vitals:   01/29/18 1610  BP: 128/66  Pulse: 79  Resp: 16  Temp: (!) 96.5 F (35.8 C)  TempSrc: Oral  SpO2: 96%  Weight: 237 lb (107.5 kg)  Height: 5\' 4"  (1.626 m)    GENERAL: The patient is a well-nourished female, in no acute distress. The vital signs are documented above. CARDIAC: There is a regular rate and rhythm.  VASCULAR: I do not detect carotid bruits. She has a palpable right dorsalis pedis pulse.  I cannot palpate a left dorsalis pedis pulse however both feet are warm and well-perfused. Her left lower extremity swelling has improved significantly. She does have some hyperpigmentation on the left. PULMONARY: There is good air exchange bilaterally without wheezing or rales. ABDOMEN: Soft and non-tender with normal pitched bowel sounds.  MUSCULOSKELETAL: There are no major deformities or cyanosis. NEUROLOGIC: No focal weakness or paresthesias are detected. SKIN: There are no ulcers or rashes noted. PSYCHIATRIC: The patient has a normal affect.  DATA:    No new data  MEDICAL ISSUES:   EXTENSIVE LEFT LOWER EXTREMITY DVT: Her symptoms in the left leg have improved and her  swelling has improved.  I have encouraged her to continue her leg elevation and asked her to continue to wear her compression  stockings.  I have refilled her Eliquis and I think she will likely need this for 3 more months.  I will see her back in December with a duplex and if her scan looks good we can then stop her Eliquis in late December.  She will call sooner if there is any problems.  Deitra Mayo [facility] [phone]

## 2018-02-21 DIAGNOSIS — R69 Illness, unspecified: Secondary | ICD-10-CM | POA: Diagnosis not present

## 2018-02-22 DIAGNOSIS — R69 Illness, unspecified: Secondary | ICD-10-CM | POA: Diagnosis not present

## 2018-04-01 ENCOUNTER — Other Ambulatory Visit: Payer: Self-pay

## 2018-04-01 DIAGNOSIS — I824Y2 Acute embolism and thrombosis of unspecified deep veins of left proximal lower extremity: Secondary | ICD-10-CM

## 2018-04-07 DIAGNOSIS — R69 Illness, unspecified: Secondary | ICD-10-CM | POA: Diagnosis not present

## 2018-04-10 ENCOUNTER — Other Ambulatory Visit: Payer: Self-pay | Admitting: Surgery

## 2018-04-10 NOTE — Telephone Encounter (Signed)
Patient is to see Dr. Scot Dock on 04-16-18 for followup on DVT and need for additional months of Eliquis. This Rx is to cover her until that appt.

## 2018-04-16 ENCOUNTER — Encounter: Payer: Self-pay | Admitting: Vascular Surgery

## 2018-04-16 ENCOUNTER — Other Ambulatory Visit: Payer: Self-pay

## 2018-04-16 ENCOUNTER — Ambulatory Visit (INDEPENDENT_AMBULATORY_CARE_PROVIDER_SITE_OTHER): Payer: Medicare HMO | Admitting: Vascular Surgery

## 2018-04-16 ENCOUNTER — Ambulatory Visit (HOSPITAL_COMMUNITY)
Admission: RE | Admit: 2018-04-16 | Discharge: 2018-04-16 | Disposition: A | Discharge status: Home / Self Care | Payer: Medicare HMO | Source: Ambulatory Visit | Attending: Vascular Surgery | Admitting: Vascular Surgery

## 2018-04-16 VITALS — BP 110/63 | HR 72 | Temp 97.2°F | Resp 20 | Ht 64.0 in | Wt 237.0 lb

## 2018-04-16 DIAGNOSIS — I824Y2 Acute embolism and thrombosis of unspecified deep veins of left proximal lower extremity: Secondary | ICD-10-CM

## 2018-04-16 NOTE — Progress Notes (Signed)
Patient name: Tonya Dougherty MRN: [id number] DOB: 02-Sep-1950 Sex: female  REASON FOR VISIT:   Follow-up of extensive left lower extremity DVT.  HPI:   Tonya Dougherty is a pleasant 67 y.o. female who I last saw on 11/27/2017.  The patient was seen in consultation in the hospital in June of this year with an extensive left lower extremity DVT.  Based on her CT venogram it appeared that she potentially had may Thurner syndrome.  We discussed thrombolysis but she had a previous bleeding problem in 2014 and was reluctant to consider an aggressive approach.  She was discharged on Xarelto.  When I saw her last on 11/27/2017, her venous duplex scan showed no change from her previous study.  She was on Eliquis and I felt this would need to be continued for 6 months or potentially longer.  We did discuss at that time the importance of intermittent leg elevation the proper positioning for this.  I also wrote her a prescription for knee-high compression stockings with a mild gradient.  She comes in for a follow-up visit.    Since I saw her last, she states that she has quit smoking!  Her leg swelling on the left has improved.  She was wearing some compression stockings but they are now too big for her.  These were not fitted stockings however.  She denies any chest pain or shortness of breath.  She remains on Eliquis.  Current Outpatient Medications  Medication Sig Dispense Refill  . acetaminophen (TYLENOL) 500 MG tablet Take 500 mg by mouth every 6 (six) hours as needed for mild pain.    Marland Kitchen apixaban (ELIQUIS) 5 MG TABS tablet Take 1 tablet (5 mg total) by mouth 2 (two) times daily. 60 tablet 2  . B-D ULTRAFINE III SHORT PEN 31G X 8 MM MISC USE AS DIRECTED TO INJECT LANTUS DAILY  3  . calamine lotion Apply 1 application topically as needed for itching. 120 mL 0  . carvedilol (COREG) 3.125 MG tablet Take 3.125 mg by mouth 2 (two) times daily with a meal.    . citalopram (CELEXA) 20 MG tablet Take 20 mg by mouth  daily.    . diphenhydrAMINE (BENADRYL) 25 MG tablet Take 1 tablet (25 mg total) by mouth 3 (three) times daily. Take one tablet three times daily for two days 10 tablet 0  . ELIQUIS 5 MG TABS tablet TAKE 1 TABLET BY MOUTH TWICE A DAY 60 tablet 0  . esomeprazole (NEXIUM) 20 MG capsule Take by mouth.    . furosemide (LASIX) 20 MG tablet Take 20 mg by mouth 2 (two) times daily.    Marland Kitchen gabapentin (NEURONTIN) 100 MG capsule Take 200 mg by mouth at bedtime.    Marland Kitchen HYDROcodone-acetaminophen (NORCO) 5-325 MG tablet Take 1 tablet by mouth every 6 (six) hours as needed for moderate pain. 12 tablet 0  . hydrOXYzine (ATARAX/VISTARIL) 10 MG tablet Take 0.5 tablets (5 mg total) by mouth every 8 (eight) hours as needed for itching. 10 tablet 0  . Insulin Glargine (BASAGLAR KWIKPEN) 100 UNIT/ML SOPN Inject 40 Units into the skin at bedtime.    . Magnesium Oxide (MAG-CAPS PO) Take 100 mg by mouth daily.    . metFORMIN (GLUCOPHAGE) 500 MG tablet Take 2 tablets by mouth 2 (two) times daily.    Marland Kitchen omeprazole (PRILOSEC) 20 MG capsule Take 40 mg by mouth daily.    . predniSONE (DELTASONE) 50 MG tablet Take 1 tablet (50 mg total) by  mouth daily. 5 tablet 0  . spironolactone (ALDACTONE) 25 MG tablet Take 25 mg by mouth daily.    . famotidine (PEPCID) 20 MG tablet Take 1 tablet (20 mg total) by mouth 2 (two) times daily. Take one tablet twice daily for two days (Patient not taking: Reported on 04/16/2018) 10 tablet 0   No current facility-administered medications for this visit.     REVIEW OF SYSTEMS:  [X]  denotes positive finding, [ ]  denotes negative finding Vascular    Leg swelling    Cardiac    Chest pain or chest pressure:    Shortness of breath upon exertion:    Short of breath when lying flat:    Irregular heart rhythm:    Constitutional    Fever or chills:     PHYSICAL EXAM:   Vitals:   04/16/18 1552  BP: 110/63  Pulse: 72  Resp: 20  Temp: (!) 97.2 F (36.2 C)  SpO2: 97%  Weight: 237 lb (107.5 kg)   Height: 5\' 4"  (1.626 m)   GENERAL: The patient is a well-nourished female, in no acute distress. The vital signs are documented above. CARDIOVASCULAR: There is a regular rate and rhythm. PULMONARY: There is good air exchange bilaterally without wheezing or rales. VASCULAR: She continues to have moderate left leg swelling but it is clearly improved.  Both feet are warm and well-perfused.  DATA:   VENOUS DUPLEX: I have independently interpreted her venous duplex scan today.  This shows that the common femoral vein femoral vein and popliteal veins are all compressible now.  Thus there has been significant resolution of her clot.  MEDICAL ISSUES:   HISTORY OF EXTENSIVE LEFT LOWER EXTREMITY DVT: This patient had a DVT of the left lower extremity back in June of this year.  She has been on Eliquis since that time.  Her duplex scan today shows significant resolution of her clot.  Given the progress she has made I would recommend 6 more months of Eliquis.  At that time I think we can discontinue it.  We have again discussed the importance of intermittent leg elevation the proper positioning for this.  I have written her prescription for knee-high compression stockings with a gradient of 15 to 20 mmHg.  I encouraged her to stay as active as possible.  I will see her back in 6 months.  She knows to call sooner if she has problems.  Deitra Mayo [facility] [phone]

## 2018-04-29 DIAGNOSIS — Z794 Long term (current) use of insulin: Secondary | ICD-10-CM | POA: Diagnosis not present

## 2018-04-29 DIAGNOSIS — Z0000 Encounter for general adult medical examination without abnormal findings: Secondary | ICD-10-CM | POA: Diagnosis not present

## 2018-04-29 DIAGNOSIS — Z720 Tobacco use: Secondary | ICD-10-CM | POA: Diagnosis not present

## 2018-04-29 DIAGNOSIS — E1165 Type 2 diabetes mellitus with hyperglycemia: Secondary | ICD-10-CM | POA: Diagnosis not present

## 2018-04-29 DIAGNOSIS — I82409 Acute embolism and thrombosis of unspecified deep veins of unspecified lower extremity: Secondary | ICD-10-CM | POA: Diagnosis not present

## 2018-05-21 DIAGNOSIS — E119 Type 2 diabetes mellitus without complications: Secondary | ICD-10-CM | POA: Diagnosis not present

## 2018-05-21 DIAGNOSIS — I10 Essential (primary) hypertension: Secondary | ICD-10-CM | POA: Diagnosis not present

## 2018-05-21 DIAGNOSIS — K7460 Unspecified cirrhosis of liver: Secondary | ICD-10-CM | POA: Diagnosis not present

## 2018-05-21 DIAGNOSIS — R69 Illness, unspecified: Secondary | ICD-10-CM | POA: Diagnosis not present

## 2018-05-21 DIAGNOSIS — D649 Anemia, unspecified: Secondary | ICD-10-CM | POA: Diagnosis not present

## 2018-05-21 DIAGNOSIS — I82409 Acute embolism and thrombosis of unspecified deep veins of unspecified lower extremity: Secondary | ICD-10-CM | POA: Diagnosis not present

## 2018-05-21 DIAGNOSIS — R945 Abnormal results of liver function studies: Secondary | ICD-10-CM | POA: Diagnosis not present

## 2018-05-21 DIAGNOSIS — R9431 Abnormal electrocardiogram [ECG] [EKG]: Secondary | ICD-10-CM | POA: Diagnosis not present

## 2018-05-23 ENCOUNTER — Other Ambulatory Visit: Payer: Self-pay | Admitting: Vascular Surgery

## 2018-05-29 DIAGNOSIS — R69 Illness, unspecified: Secondary | ICD-10-CM | POA: Diagnosis not present

## 2018-06-15 ENCOUNTER — Other Ambulatory Visit: Payer: Self-pay | Admitting: Vascular Surgery

## 2018-07-11 ENCOUNTER — Ambulatory Visit (HOSPITAL_COMMUNITY)
Admission: EM | Admit: 2018-07-11 | Discharge: 2018-07-11 | Disposition: A | Discharge status: Home / Self Care | Payer: Medicare HMO | Attending: Emergency Medicine | Admitting: Emergency Medicine

## 2018-07-11 ENCOUNTER — Other Ambulatory Visit: Payer: Self-pay

## 2018-07-11 ENCOUNTER — Encounter (HOSPITAL_COMMUNITY): Payer: Self-pay | Admitting: Emergency Medicine

## 2018-07-11 DIAGNOSIS — J019 Acute sinusitis, unspecified: Secondary | ICD-10-CM

## 2018-07-11 DIAGNOSIS — H1033 Unspecified acute conjunctivitis, bilateral: Secondary | ICD-10-CM

## 2018-07-11 MED ORDER — AMOXICILLIN-POT CLAVULANATE 875-125 MG PO TABS: 1.0000 | ORAL_TABLET | Freq: Two times a day (BID) | ORAL | 0 refills | Status: AC

## 2018-07-11 MED ORDER — POLYMYXIN B-TRIMETHOPRIM 10000-0.1 UNIT/ML-% OP SOLN: 1.0000 [drp] | OPHTHALMIC | 0 refills | Status: AC

## 2018-07-11 NOTE — ED Triage Notes (Signed)
PT C/O: cold sx onset 6 days associated w/poss bilateral pink eye.... Sx include: itching, watery, drainage, nasal congestion/drainage  DENIES: fevers, SOB  TAKING MEDS: OTC pink eye meds/allergy meds/cold meds.   A&O x4... NAD... Ambulatory

## 2018-07-11 NOTE — Discharge Instructions (Signed)
Push fluids to ensure adequate hydration and keep secretions thin.  Tylenol as needed for pain or fevers.  Complete course of eye drops to both eyes, avoid touching them as able.  Complete course of antibiotics.  If symptoms worsen or do not improve in the next week to return to be seen or to follow up with your PCP.

## 2018-07-11 NOTE — ED Provider Notes (Signed)
Tonya Dougherty    CSN: [id number] Arrival date & time: 07/11/18  6440     History   Chief Complaint Chief Complaint  Patient presents with  . URI    HPI Tonya Dougherty is a 68 y.o. female.   [name] presents with complaints of runny nose, facial congestion and bilateral eye redness, irritation and drainage. Started approximately 5 days ago and is worsening. Eye mattering. Minimal cough. Had a blood nose last night, she is on a blood thinner due to hx of DVT. No sore throat. No ear pain. Headache due to facial pressure. No gi/gu complaints. Has tried OTC sinus medication, mucinex and eye drops which have not helped with symptoms. No chest pain  Or shortness of breath . Hx of depression, dm, htn.     ROS per HPI, negative if not otherwise mentioned.      Past Medical History:  Diagnosis Date  . Arthritis   . Depression   . Diabetes mellitus without complication ([place])   . GI bleed   . Hypertension     Patient Active Problem List   Diagnosis Date Noted  . DVT (deep venous thrombosis) ([place]) 10/22/2017    Past Surgical History:  Procedure Laterality Date  . ANKLE SURGERY      OB History   No obstetric history on file.      Home Medications    Prior to Admission medications   Medication Sig Start Date End Date Taking? Authorizing Provider  acetaminophen (TYLENOL) 500 MG tablet Take 500 mg by mouth every 6 (six) hours as needed for mild pain.   Yes [provider]  apixaban (ELIQUIS) 5 MG TABS tablet Take 1 tablet (5 mg total) by mouth 2 (two) times daily. 01/29/18  Yes Angelia Mould, MD  B-D ULTRAFINE III SHORT PEN 31G X 8 MM MISC USE AS DIRECTED TO INJECT LANTUS DAILY 10/24/17  Yes [provider]  calamine lotion Apply 1 application topically as needed for itching. 11/07/17  Yes Wurst, [place], PA-C  carvedilol (COREG) 3.125 MG tablet Take 3.125 mg by mouth 2 (two) times daily with a meal.   Yes [provider]   citalopram (CELEXA) 20 MG tablet Take 20 mg by mouth daily.   Yes [provider]  diphenhydrAMINE (BENADRYL) 25 MG tablet Take 1 tablet (25 mg total) by mouth 3 (three) times daily. Take one tablet three times daily for two days 02/03/16  Yes Carmin Muskrat, MD  ELIQUIS 5 MG TABS tablet TAKE 1 TABLET BY MOUTH TWICE A DAY 06/16/18  Yes Angelia Mould, MD  esomeprazole ([place]) 20 MG capsule Take by mouth. 03/18/18 03/18/19 Yes [provider]  famotidine (PEPCID) 20 MG tablet Take 1 tablet (20 mg total) by mouth 2 (two) times daily. Take one tablet twice daily for two days 02/03/16 12/11/25 Yes Carmin Muskrat, MD  furosemide (LASIX) 20 MG tablet Take 20 mg by mouth 2 (two) times daily.   Yes [provider]  gabapentin (NEURONTIN) 100 MG capsule Take 200 mg by mouth at bedtime.   Yes [provider]  HYDROcodone-acetaminophen (NORCO) 5-325 MG tablet Take 1 tablet by mouth every 6 (six) hours as needed for moderate pain. 10/24/17  Yes Dagoberto Ligas, PA-C  hydrOXYzine (ATARAX/VISTARIL) 10 MG tablet Take 0.5 tablets (5 mg total) by mouth every 8 (eight) hours as needed for itching. 02/03/16  Yes Carmin Muskrat, MD  Insulin Glargine [facility] KWIKPEN) 100 UNIT/ML SOPN Inject 40 Units into the skin  at bedtime. 10/11/17  Yes [provider]  Magnesium Oxide (MAG-CAPS PO) Take 100 mg by mouth daily.   Yes [provider]  omeprazole (PRILOSEC) 20 MG capsule Take 40 mg by mouth daily.   Yes [provider]  predniSONE (DELTASONE) 50 MG tablet Take 1 tablet (50 mg total) by mouth daily. 11/07/17  Yes Wurst, [place], PA-C  spironolactone (ALDACTONE) 25 MG tablet Take 25 mg by mouth daily.   Yes [provider]  amoxicillin-clavulanate (AUGMENTIN) 875-125 MG tablet Take 1 tablet by mouth every 12 (twelve) hours for 10 days. 07/11/18 07/21/18  Zigmund Gottron, NP  metFORMIN (GLUCOPHAGE) 500 MG tablet Take 2 tablets by mouth 2 (two)  times daily. 02/21/18 05/22/18  [provider]  trimethoprim-polymyxin b (POLYTRIM) ophthalmic solution Place 1 drop into both eyes every 4 (four) hours for 5 days. 07/11/18 07/16/18  Zigmund Gottron, NP    Family History History reviewed. No pertinent family history.  Social History Social History   Tobacco Use  . Smoking status: Former Smoker    Packs/day: 0.50    Types: Cigarettes    Last attempt to quit: 10/2017    Years since quitting: 0.7  . Smokeless tobacco: Never Used  Substance Use Topics  . Alcohol use: No  . Drug use: Not Currently     Allergies   Xarelto [rivaroxaban]; Erythromycin; and Ivp dye [iodinated diagnostic agents]   Review of Systems Review of Systems   Physical Exam Triage Vital Signs ED Triage Vitals  Enc Vitals Group     BP 07/11/18 0925 (!) 128/57     Pulse Rate 07/11/18 0925 76     Resp 07/11/18 0925 16     Temp 07/11/18 0925 98.2 F (36.8 C)     Temp Source 07/11/18 0925 Oral     SpO2 07/11/18 0925 97 %     Weight --      Height --      Head Circumference --      Peak Flow --      Pain Score 07/11/18 0926 5     Pain Loc --      Pain Edu? --      Excl. in [place]? --    No data found.  Updated Vital Signs BP (!) 128/57 (BP Location: Left Arm)   Pulse 76   Temp 98.2 F (36.8 C) (Oral)   Resp 16   SpO2 97%   Visual Acuity Right Eye Distance:   Left Eye Distance:   Bilateral Distance:    Right Eye Near:   Left Eye Near:    Bilateral Near:     Physical Exam Constitutional:      General: She is not in acute distress.    Appearance: She is well-developed.  HENT:     Head: Normocephalic and atraumatic.     Right Ear: Tympanic membrane, ear canal and external ear normal.     Left Ear: Tympanic membrane, ear canal and external ear normal.     Nose: Rhinorrhea present.     Right Turbinates: Enlarged.     Left Turbinates: Enlarged.     Mouth/Throat:     Pharynx: Uvula midline.     Tonsils: No tonsillar exudate.   Eyes:     General: Lids are normal. Vision grossly intact.        Right eye: Discharge present.        Left eye: Discharge present.    Conjunctiva/sclera:  Right eye: Right conjunctiva is injected.     Left eye: Left conjunctiva is injected.     Pupils: Pupils are equal, round, and reactive to light.  Cardiovascular:     Rate and Rhythm: Normal rate and regular rhythm.     Heart sounds: Normal heart sounds.  Pulmonary:     Effort: Pulmonary effort is normal.     Breath sounds: Normal breath sounds.     Comments: Occasional strong congested cough noted  Skin:    General: Skin is warm and dry.  Neurological:     Mental Status: She is alert and oriented to person, place, and time.      UC Treatments / Results  Labs (all labs ordered are listed, but only abnormal results are displayed) Labs Reviewed - No data to display  EKG None  Radiology No results found.  Procedures Procedures (including critical care time)  Medications Ordered in UC Medications - No data to display  Initial Impression / Assessment and Plan / UC Course  I have reviewed the triage vital signs and the nursing notes.  Pertinent labs & imaging results that were available during my care of the patient were reviewed by me and considered in my medical decision making (see chart for details).     Worsening of sinus symptoms and conjunctivitis present. augmentin and eye drops provided. Return precautions provided. If symptoms worsen or do not improve in the next week to return to be seen or to follow up with PCP.  Patient verbalized understanding and agreeable to plan.   Final Clinical Impressions(s) / UC Diagnoses   Final diagnoses:  Acute sinusitis, recurrence not specified, unspecified location  Acute conjunctivitis of both eyes, unspecified acute conjunctivitis type     Discharge Instructions     Push fluids to ensure adequate hydration and keep secretions thin.  Tylenol as needed for pain or  fevers.  Complete course of eye drops to both eyes, avoid touching them as able.  Complete course of antibiotics.  If symptoms worsen or do not improve in the next week to return to be seen or to follow up with your PCP.      ED Prescriptions    Medication Sig Dispense Auth. Provider   amoxicillin-clavulanate (AUGMENTIN) 875-125 MG tablet Take 1 tablet by mouth every 12 (twelve) hours for 10 days. 20 tablet Augusto Gamble B, NP   trimethoprim-polymyxin b (POLYTRIM) ophthalmic solution Place 1 drop into both eyes every 4 (four) hours for 5 days. 10 mL Zigmund Gottron, NP     Controlled Substance Prescriptions [place] Controlled Substance Registry consulted? Not Applicable   Zigmund Gottron, NP 07/11/18 1021

## 2018-07-20 DIAGNOSIS — R69 Illness, unspecified: Secondary | ICD-10-CM | POA: Diagnosis not present

## 2018-10-01 DIAGNOSIS — Z794 Long term (current) use of insulin: Secondary | ICD-10-CM | POA: Diagnosis not present

## 2018-10-01 DIAGNOSIS — E119 Type 2 diabetes mellitus without complications: Secondary | ICD-10-CM | POA: Diagnosis not present

## 2018-12-23 DIAGNOSIS — M25569 Pain in unspecified knee: Secondary | ICD-10-CM | POA: Diagnosis not present

## 2018-12-23 DIAGNOSIS — M25541 Pain in joints of right hand: Secondary | ICD-10-CM | POA: Diagnosis not present

## 2018-12-23 DIAGNOSIS — M25542 Pain in joints of left hand: Secondary | ICD-10-CM | POA: Diagnosis not present

## 2018-12-23 DIAGNOSIS — Z794 Long term (current) use of insulin: Secondary | ICD-10-CM | POA: Diagnosis not present

## 2018-12-23 DIAGNOSIS — E119 Type 2 diabetes mellitus without complications: Secondary | ICD-10-CM | POA: Diagnosis not present

## 2018-12-23 DIAGNOSIS — M545 Low back pain: Secondary | ICD-10-CM | POA: Diagnosis not present

## 2018-12-23 DIAGNOSIS — G8929 Other chronic pain: Secondary | ICD-10-CM | POA: Diagnosis not present

## 2018-12-26 DIAGNOSIS — K649 Unspecified hemorrhoids: Secondary | ICD-10-CM | POA: Diagnosis not present

## 2018-12-26 DIAGNOSIS — M2550 Pain in unspecified joint: Secondary | ICD-10-CM | POA: Diagnosis not present

## 2018-12-26 DIAGNOSIS — I82409 Acute embolism and thrombosis of unspecified deep veins of unspecified lower extremity: Secondary | ICD-10-CM | POA: Diagnosis not present

## 2018-12-26 DIAGNOSIS — Z8673 Personal history of transient ischemic attack (TIA), and cerebral infarction without residual deficits: Secondary | ICD-10-CM | POA: Diagnosis not present

## 2018-12-26 DIAGNOSIS — E119 Type 2 diabetes mellitus without complications: Secondary | ICD-10-CM | POA: Diagnosis not present

## 2018-12-26 DIAGNOSIS — E669 Obesity, unspecified: Secondary | ICD-10-CM | POA: Diagnosis not present

## 2018-12-26 DIAGNOSIS — K7460 Unspecified cirrhosis of liver: Secondary | ICD-10-CM | POA: Diagnosis not present

## 2019-01-30 DIAGNOSIS — R69 Illness, unspecified: Secondary | ICD-10-CM | POA: Diagnosis not present

## 2019-02-06 DIAGNOSIS — Z91041 Radiographic dye allergy status: Secondary | ICD-10-CM | POA: Diagnosis not present

## 2019-02-06 DIAGNOSIS — E119 Type 2 diabetes mellitus without complications: Secondary | ICD-10-CM | POA: Diagnosis not present

## 2019-02-06 DIAGNOSIS — Z87891 Personal history of nicotine dependence: Secondary | ICD-10-CM | POA: Diagnosis not present

## 2019-02-06 DIAGNOSIS — E669 Obesity, unspecified: Secondary | ICD-10-CM | POA: Diagnosis not present

## 2019-02-06 DIAGNOSIS — R69 Illness, unspecified: Secondary | ICD-10-CM | POA: Diagnosis not present

## 2019-02-06 DIAGNOSIS — K7460 Unspecified cirrhosis of liver: Secondary | ICD-10-CM | POA: Diagnosis not present

## 2019-02-06 DIAGNOSIS — Z862 Personal history of diseases of the blood and blood-forming organs and certain disorders involving the immune mechanism: Secondary | ICD-10-CM | POA: Diagnosis not present

## 2019-02-06 DIAGNOSIS — Z7901 Long term (current) use of anticoagulants: Secondary | ICD-10-CM | POA: Diagnosis not present

## 2019-02-06 DIAGNOSIS — K219 Gastro-esophageal reflux disease without esophagitis: Secondary | ICD-10-CM | POA: Diagnosis not present

## 2019-02-06 DIAGNOSIS — I82409 Acute embolism and thrombosis of unspecified deep veins of unspecified lower extremity: Secondary | ICD-10-CM | POA: Diagnosis not present

## 2019-02-06 DIAGNOSIS — Z86718 Personal history of other venous thrombosis and embolism: Secondary | ICD-10-CM | POA: Diagnosis not present

## 2019-02-06 DIAGNOSIS — I8500 Esophageal varices without bleeding: Secondary | ICD-10-CM | POA: Diagnosis not present

## 2019-03-25 DIAGNOSIS — M2550 Pain in unspecified joint: Secondary | ICD-10-CM | POA: Diagnosis not present

## 2019-04-27 DIAGNOSIS — R69 Illness, unspecified: Secondary | ICD-10-CM | POA: Diagnosis not present

## 2019-04-29 DIAGNOSIS — M25542 Pain in joints of left hand: Secondary | ICD-10-CM | POA: Diagnosis not present

## 2019-04-29 DIAGNOSIS — D649 Anemia, unspecified: Secondary | ICD-10-CM | POA: Diagnosis not present

## 2019-04-29 DIAGNOSIS — K5900 Constipation, unspecified: Secondary | ICD-10-CM | POA: Diagnosis not present

## 2019-04-29 DIAGNOSIS — R5383 Other fatigue: Secondary | ICD-10-CM | POA: Diagnosis not present

## 2019-04-29 DIAGNOSIS — Z794 Long term (current) use of insulin: Secondary | ICD-10-CM | POA: Diagnosis not present

## 2019-04-29 DIAGNOSIS — R0600 Dyspnea, unspecified: Secondary | ICD-10-CM | POA: Diagnosis not present

## 2019-04-29 DIAGNOSIS — M25541 Pain in joints of right hand: Secondary | ICD-10-CM | POA: Diagnosis not present

## 2019-04-29 DIAGNOSIS — Z1231 Encounter for screening mammogram for malignant neoplasm of breast: Secondary | ICD-10-CM | POA: Diagnosis not present

## 2019-04-29 DIAGNOSIS — E119 Type 2 diabetes mellitus without complications: Secondary | ICD-10-CM | POA: Diagnosis not present

## 2019-04-29 DIAGNOSIS — K649 Unspecified hemorrhoids: Secondary | ICD-10-CM | POA: Diagnosis not present

## 2019-04-30 ENCOUNTER — Emergency Department (HOSPITAL_COMMUNITY)
Admission: EM | Admit: 2019-04-30 | Discharge: 2019-04-30 | Disposition: A | Discharge status: Home / Self Care | Payer: Medicare HMO | Attending: Emergency Medicine | Admitting: Emergency Medicine

## 2019-04-30 ENCOUNTER — Emergency Department (HOSPITAL_COMMUNITY): Payer: Medicare HMO

## 2019-04-30 ENCOUNTER — Other Ambulatory Visit: Payer: Self-pay

## 2019-04-30 DIAGNOSIS — D649 Anemia, unspecified: Secondary | ICD-10-CM

## 2019-04-30 DIAGNOSIS — R0902 Hypoxemia: Secondary | ICD-10-CM | POA: Diagnosis not present

## 2019-04-30 DIAGNOSIS — Z87891 Personal history of nicotine dependence: Secondary | ICD-10-CM | POA: Insufficient documentation

## 2019-04-30 DIAGNOSIS — Z79899 Other long term (current) drug therapy: Secondary | ICD-10-CM | POA: Insufficient documentation

## 2019-04-30 DIAGNOSIS — N179 Acute kidney failure, unspecified: Secondary | ICD-10-CM

## 2019-04-30 DIAGNOSIS — R0602 Shortness of breath: Secondary | ICD-10-CM

## 2019-04-30 DIAGNOSIS — R7989 Other specified abnormal findings of blood chemistry: Secondary | ICD-10-CM | POA: Diagnosis not present

## 2019-04-30 DIAGNOSIS — Z7984 Long term (current) use of oral hypoglycemic drugs: Secondary | ICD-10-CM | POA: Insufficient documentation

## 2019-04-30 DIAGNOSIS — I10 Essential (primary) hypertension: Secondary | ICD-10-CM | POA: Insufficient documentation

## 2019-04-30 DIAGNOSIS — E119 Type 2 diabetes mellitus without complications: Secondary | ICD-10-CM | POA: Diagnosis not present

## 2019-04-30 DIAGNOSIS — Z7901 Long term (current) use of anticoagulants: Secondary | ICD-10-CM | POA: Diagnosis not present

## 2019-04-30 DIAGNOSIS — R5383 Other fatigue: Secondary | ICD-10-CM | POA: Diagnosis present

## 2019-04-30 LAB — CBC WITH DIFFERENTIAL/PLATELET
Abs Immature Granulocytes: 0.03 10*3/uL (ref 0.00–0.07)
Basophils Absolute: 0 10*3/uL (ref 0.0–0.1)
Basophils Relative: 1 %
Eosinophils Absolute: 0.1 10*3/uL (ref 0.0–0.5)
Eosinophils Relative: 1 %
HCT: 23.2 % — ABNORMAL LOW (ref 36.0–46.0)
Hemoglobin: 6.1 g/dL — CL (ref 12.0–15.0)
Immature Granulocytes: 1 %
Lymphocytes Relative: 16 %
Lymphs Abs: 1 10*3/uL (ref 0.7–4.0)
MCH: 20.7 pg — ABNORMAL LOW (ref 26.0–34.0)
MCHC: 26.3 g/dL — ABNORMAL LOW (ref 30.0–36.0)
MCV: 78.6 fL — ABNORMAL LOW (ref 80.0–100.0)
Monocytes Absolute: 0.4 10*3/uL (ref 0.1–1.0)
Monocytes Relative: 6 %
Neutro Abs: 4.9 10*3/uL (ref 1.7–7.7)
Neutrophils Relative %: 75 %
Platelets: 265 10*3/uL (ref 150–400)
RBC: 2.95 MIL/uL — ABNORMAL LOW (ref 3.87–5.11)
RDW: 20.8 % — ABNORMAL HIGH (ref 11.5–15.5)
WBC: 6.4 10*3/uL (ref 4.0–10.5)
nRBC: 0 % (ref 0.0–0.2)

## 2019-04-30 LAB — COMPREHENSIVE METABOLIC PANEL
ALT: 14 U/L (ref 0–44)
AST: 21 U/L (ref 15–41)
Albumin: 3.1 g/dL — ABNORMAL LOW (ref 3.5–5.0)
Alkaline Phosphatase: 116 U/L (ref 38–126)
Anion gap: 10 (ref 5–15)
BUN: 19 mg/dL (ref 8–23)
CO2: 26 mmol/L (ref 22–32)
Calcium: 8.9 mg/dL (ref 8.9–10.3)
Chloride: 103 mmol/L (ref 98–111)
Creatinine, Ser: 1.43 mg/dL — ABNORMAL HIGH (ref 0.44–1.00)
GFR calc Af Amer: 44 mL/min — ABNORMAL LOW (ref >60)
GFR calc non Af Amer: 38 mL/min — ABNORMAL LOW (ref >60)
Glucose, Bld: 164 mg/dL — ABNORMAL HIGH (ref 70–99)
Potassium: 3.9 mmol/L (ref 3.5–5.1)
Sodium: 139 mmol/L (ref 135–145)
Total Bilirubin: 0.8 mg/dL (ref 0.3–1.2)
Total Protein: 6.8 g/dL (ref 6.5–8.1)

## 2019-04-30 LAB — URINALYSIS, ROUTINE W REFLEX MICROSCOPIC
Bilirubin Urine: NEGATIVE
Glucose, UA: NEGATIVE mg/dL
Hgb urine dipstick: NEGATIVE
Ketones, ur: NEGATIVE mg/dL
Leukocytes,Ua: NEGATIVE
Nitrite: NEGATIVE
Protein, ur: NEGATIVE mg/dL
Specific Gravity, Urine: 1.016 (ref 1.005–1.030)
pH: 5 (ref 5.0–8.0)

## 2019-04-30 LAB — PREPARE RBC (CROSSMATCH)

## 2019-04-30 LAB — HEMOGLOBIN AND HEMATOCRIT, BLOOD
HCT: 28.2 % — ABNORMAL LOW (ref 36.0–46.0)
Hemoglobin: 7.9 g/dL — ABNORMAL LOW (ref 12.0–15.0)

## 2019-04-30 LAB — TROPONIN I (HIGH SENSITIVITY)
Troponin I (High Sensitivity): 5 ng/L (ref <18)
Troponin I (High Sensitivity): 6 ng/L (ref <18)

## 2019-04-30 LAB — TSH: TSH: 1.236 u[IU]/mL (ref 0.350–4.500)

## 2019-04-30 LAB — POC OCCULT BLOOD, ED: Fecal Occult Bld: NEGATIVE

## 2019-04-30 LAB — BRAIN NATRIURETIC PEPTIDE: B Natriuretic Peptide: 269.2 pg/mL — ABNORMAL HIGH (ref 0.0–100.0)

## 2019-04-30 LAB — LIPASE, BLOOD: Lipase: 33 U/L (ref 11–51)

## 2019-04-30 IMAGING — DX DG CHEST 1V PORT
1 series · 1 of 1 positions shown · non-contrast
Comparison: None.

CLINICAL DATA: Shortness of breath

EXAM:
PORTABLE CHEST 1 VIEW

[chest]
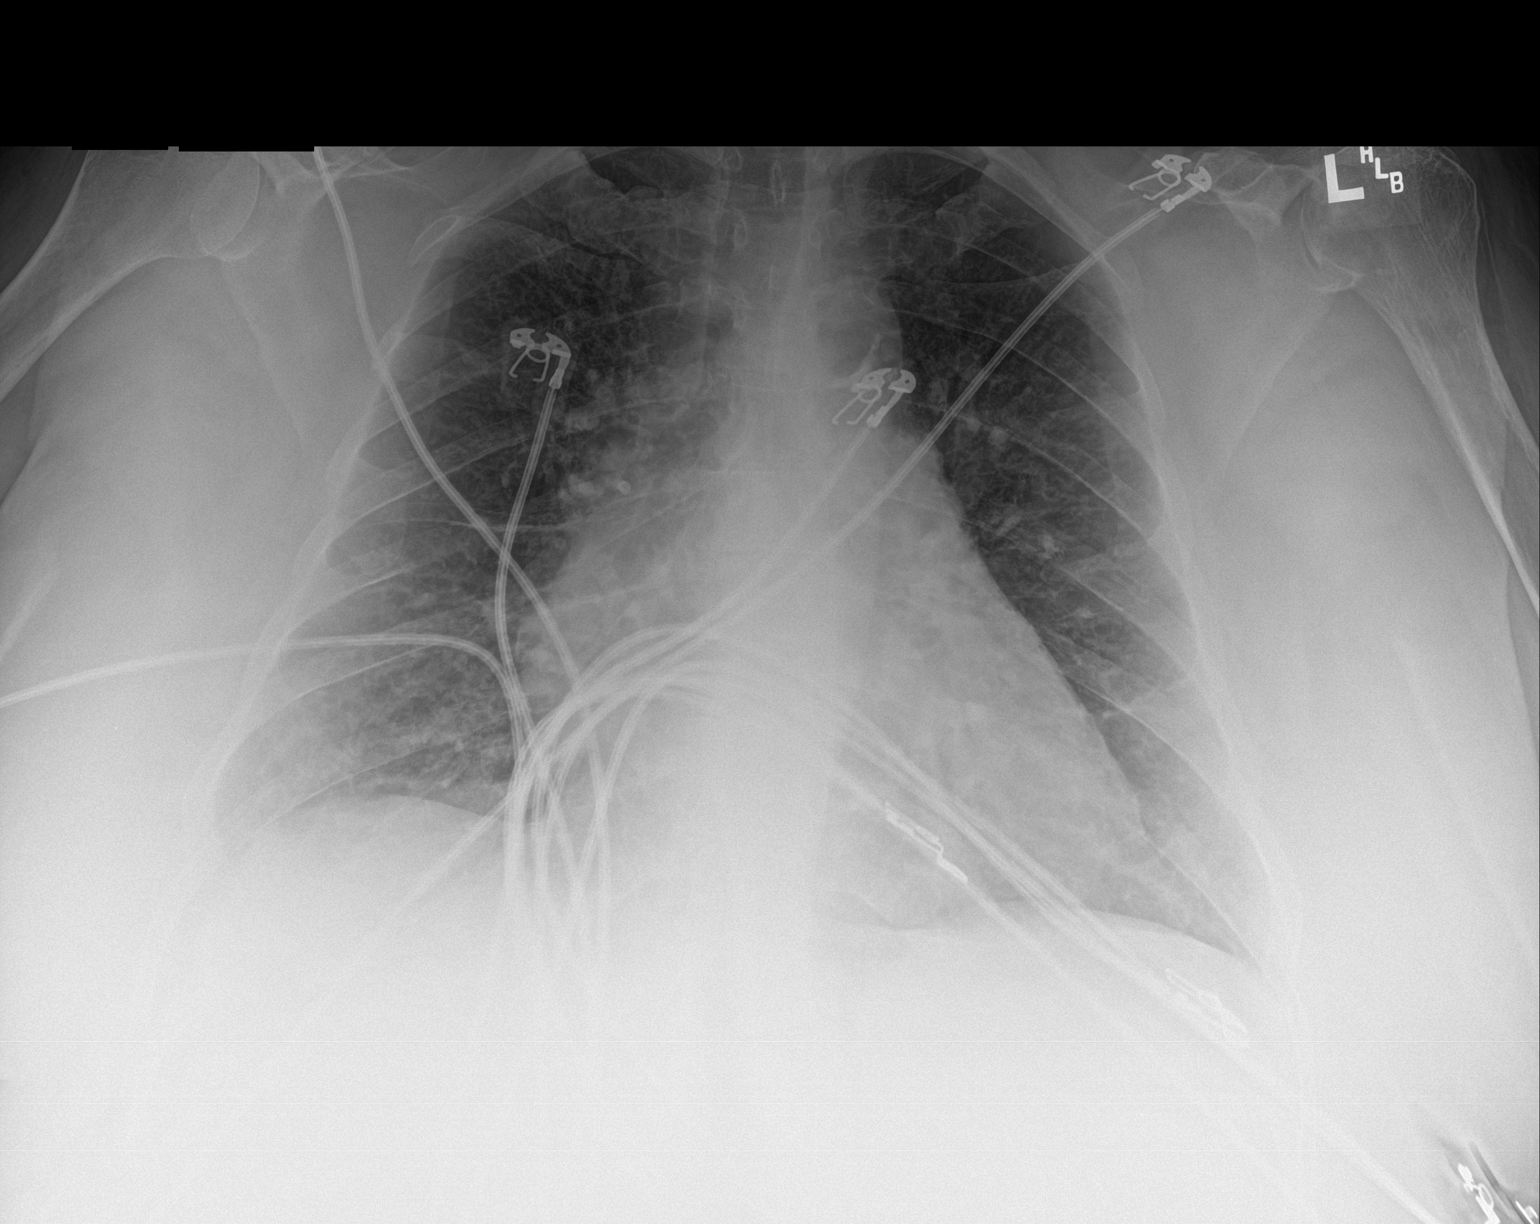

[1 of 1 positions shown; findings below may reference images not displayed]

FINDINGS: There is mild atelectasis in the right base. The lungs elsewhere are
clear. Heart is mildly enlarged with pulmonary vascularity normal.
No adenopathy. There is aortic atherosclerosis. No bone lesions.
IMPRESSION: Mild atelectasis right base. No edema or consolidation. Heart mildly
enlarged. No adenopathy. Aortic Atherosclerosis ([E9]-[E9]).

## 2019-04-30 MED ORDER — SODIUM CHLORIDE 0.9 % IV SOLN
10.0000 mL/h | Freq: Once | INTRAVENOUS | Status: AC
  Administered 2019-04-30: 10 mL/h via INTRAVENOUS

## 2019-04-30 MED ORDER — LORAZEPAM 2 MG/ML IJ SOLN
0.5000 mg | Freq: Once | INTRAMUSCULAR | Status: AC
  Administered 2019-04-30: 0.5 mg via INTRAVENOUS
  Filled 2019-04-30: qty 1

## 2019-04-30 NOTE — Discharge Instructions (Signed)
Your work-up today did show your blood counts were low as seen by your primary doctor at 6.1.  We had a long shared decision-making conversation and went over the fact that your kidney function was elevated, you had some evidence of fluid buildup, and I saw your oxygen in the eighties at one point, but you would rather be transfused with blood in the emergency department and then discharged home.  After transfusion, your hemoglobin improved to 7.9 and symptomatically you were feeling better.  You able to walk to the bathroom without being lightheaded or too short of breath.  Please follow-up with your primary doctor in the next 24 to 48 hours and if any symptoms change or worsen, please return to the nearest emergency department immediately.

## 2019-04-30 NOTE — ED Notes (Signed)
ED Provider at bedside. 

## 2019-04-30 NOTE — ED Notes (Signed)
[place] 336 [phone]

## 2019-04-30 NOTE — ED Notes (Signed)
Critical value received from lab, Hgb 6.1; Notified Dr. Sherry Ruffing.

## 2019-04-30 NOTE — ED Triage Notes (Signed)
Pt arrives POV; was sent over by provider for blood transfusion. Pt reports hx of GI bleeding and hemorrhoid; denies bleeding at this time. Pt a&o x4.

## 2019-04-30 NOTE — ED Provider Notes (Signed)
[facility]ERG[facility] DEPARTMENT Provider Note   CSN: [id number] Arrival date & time: 04/30/19  1054     History Chief Complaint  Patient presents with  . sent for blood transfusion    hx of GI bleeding    Graham Zaleski is a 68 y.o. female.  The history is provided by the patient and medical records. No language interpreter was used.  Illness Location:  Generalized fatigue, exertional shortness of breath, anemia with outside labs. Severity:  Moderate Onset quality:  Gradual Duration:  1 month Timing:  Constant Progression:  Worsening Chronicity:  Recurrent Associated symptoms: fatigue, nausea and shortness of breath   Associated symptoms: no abdominal pain (mild bloatingfeeling), no chest pain, no congestion, no cough, no diarrhea, no fever, no headaches, no myalgias, no rash, no vomiting and no wheezing        Past Medical History:  Diagnosis Date  . Arthritis   . Depression   . Diabetes mellitus without complication ([place])   . GI bleed   . Hypertension     Patient Active Problem List   Diagnosis Date Noted  . DVT (deep venous thrombosis) ([place]) 10/22/2017    Past Surgical History:  Procedure Laterality Date  . ANKLE SURGERY       OB History   No obstetric history on file.     No family history on file.  Social History   Tobacco Use  . Smoking status: Former Smoker    Packs/day: 0.50    Types: Cigarettes    Quit date: 10/2017    Years since quitting: 1.5  . Smokeless tobacco: Never Used  Substance Use Topics  . Alcohol use: No  . Drug use: Not Currently    Home Medications Prior to Admission medications   Medication Sig Start Date End Date Taking? Authorizing Provider  acetaminophen (TYLENOL) 500 MG tablet Take 500 mg by mouth every 6 (six) hours as needed for mild pain.    [provider]  apixaban (ELIQUIS) 5 MG TABS tablet Take 1 tablet (5 mg total) by mouth 2 (two) times daily. 01/29/18   Angelia Mould,  MD  B-D ULTRAFINE III SHORT PEN 31G X 8 MM MISC USE AS DIRECTED TO INJECT LANTUS DAILY 10/24/17   [provider]  calamine lotion Apply 1 application topically as needed for itching. 11/07/17   Wurst, [place], PA-C  carvedilol (COREG) 3.125 MG tablet Take 3.125 mg by mouth 2 (two) times daily with a meal.    [provider]  citalopram (CELEXA) 20 MG tablet Take 20 mg by mouth daily.    [provider]  diphenhydrAMINE (BENADRYL) 25 MG tablet Take 1 tablet (25 mg total) by mouth 3 (three) times daily. Take one tablet three times daily for two days 02/03/16   Carmin Muskrat, MD  ELIQUIS 5 MG TABS tablet TAKE 1 TABLET BY MOUTH TWICE A DAY 06/16/18   Angelia Mould, MD  esomeprazole ([employer]) 20 MG capsule Take by mouth. 03/18/18 03/18/19  [provider]  famotidine (PEPCID) 20 MG tablet Take 1 tablet (20 mg total) by mouth 2 (two) times daily. Take one tablet twice daily for two days 02/03/16 12/11/25  Carmin Muskrat, MD  furosemide (LASIX) 20 MG tablet Take 20 mg by mouth 2 (two) times daily.    [provider]  gabapentin (NEURONTIN) 100 MG capsule Take 200 mg by mouth at bedtime.    [provider]  HYDROcodone-acetaminophen (NORCO) 5-325 MG tablet Take 1  tablet by mouth every 6 (six) hours as needed for moderate pain. 10/24/17   Dagoberto Ligas, PA-C  hydrOXYzine (ATARAX/VISTARIL) 10 MG tablet Take 0.5 tablets (5 mg total) by mouth every 8 (eight) hours as needed for itching. 02/03/16   Carmin Muskrat, MD  Insulin Glargine [facility]) 100 UNIT/ML SOPN Inject 40 Units into the skin at bedtime. 10/11/17   [provider]  Magnesium Oxide (MAG-CAPS PO) Take 100 mg by mouth daily.    [provider]  metFORMIN (GLUCOPHAGE) 500 MG tablet Take 2 tablets by mouth 2 (two) times daily. 02/21/18 05/22/18  [provider]  omeprazole (PRILOSEC) 20 MG capsule Take 40 mg by mouth daily.    [provider]    predniSONE (DELTASONE) 50 MG tablet Take 1 tablet (50 mg total) by mouth daily. 11/07/17   Wurst, [place], PA-C  spironolactone (ALDACTONE) 25 MG tablet Take 25 mg by mouth daily.    [provider]    Allergies    Xarelto [rivaroxaban], Erythromycin, and Ivp dye [iodinated diagnostic agents]  Review of Systems   Review of Systems  Constitutional: Positive for fatigue. Negative for chills, diaphoresis and fever.  HENT: Negative for congestion.   Respiratory: Positive for shortness of breath. Negative for cough, chest tightness, wheezing and stridor.   Cardiovascular: Positive for leg swelling (chronic). Negative for chest pain and palpitations.  Gastrointestinal: Positive for nausea. Negative for abdominal pain (mild bloatingfeeling), constipation, diarrhea and vomiting.  Genitourinary: Negative for dysuria.  Musculoskeletal: Negative for back pain, myalgias, neck pain and neck stiffness.  Skin: Positive for pallor. Negative for rash and wound.  Neurological: Positive for light-headedness. Negative for dizziness, syncope, weakness, numbness and headaches.  Psychiatric/Behavioral: Negative for agitation.  All other systems reviewed and are negative.   Physical Exam Updated Vital Signs BP (!) 117/55   Pulse 86   Temp 97.6 F (36.4 C)   Resp 19   SpO2 97%   Physical Exam Vitals and nursing note reviewed.  Constitutional:      General: She is not in acute distress.    Appearance: She is well-developed. She is not ill-appearing, toxic-appearing or diaphoretic.  HENT:     Head: Normocephalic and atraumatic.     Right Ear: External ear normal.     Left Ear: External ear normal.     Nose: Nose normal. No congestion or rhinorrhea.     Mouth/Throat:     Pharynx: No oropharyngeal exudate or posterior oropharyngeal erythema.  Eyes:     Conjunctiva/sclera: Conjunctivae normal.     Pupils: Pupils are equal, round, and reactive to light.  Cardiovascular:     Rate and  Rhythm: Normal rate.     Pulses: Normal pulses.     Heart sounds: No murmur.  Pulmonary:     Effort: Pulmonary effort is normal. No respiratory distress.     Breath sounds: No stridor. No wheezing, rhonchi or rales.  Chest:     Chest wall: No tenderness.  Abdominal:     General: Abdomen is flat. There is no distension.     Tenderness: There is no abdominal tenderness. There is no right CVA tenderness, left CVA tenderness or rebound.  Genitourinary:    Rectum: Guaiac result negative.  Musculoskeletal:        General: No tenderness.     Cervical back: Normal range of motion and neck supple.     Right lower leg: Edema present.     Left lower leg: Edema present.  Skin:    General: Skin is warm.     Coloration: Skin is pale.     Findings: No erythema or rash.  Neurological:     General: No focal deficit present.     Mental Status: She is alert and oriented to person, place, and time.     Sensory: No sensory deficit.     Motor: No weakness or abnormal muscle tone.     Deep Tendon Reflexes: Reflexes are normal and symmetric.  Psychiatric:        Mood and Affect: Mood normal.     ED Results / Procedures / Treatments   Labs (all labs ordered are listed, but only abnormal results are displayed) Labs Reviewed  CBC WITH DIFFERENTIAL/PLATELET - Abnormal; Notable for the following components:      Result Value   RBC 2.95 (*)    Hemoglobin 6.1 (*)    HCT 23.2 (*)    MCV 78.6 (*)    MCH 20.7 (*)    MCHC 26.3 (*)    RDW 20.8 (*)    All other components within normal limits  COMPREHENSIVE METABOLIC PANEL - Abnormal; Notable for the following components:   Glucose, Bld 164 (*)    Creatinine, Ser 1.43 (*)    Albumin 3.1 (*)    GFR calc non Af Amer 38 (*)    GFR calc Af Amer 44 (*)    All other components within normal limits  BRAIN NATRIURETIC PEPTIDE - Abnormal; Notable for the following components:   B Natriuretic Peptide 269.2 (*)    All other components within normal limits   HEMOGLOBIN AND HEMATOCRIT, BLOOD - Abnormal; Notable for the following components:   Hemoglobin 7.9 (*)    HCT 28.2 (*)    All other components within normal limits  URINE CULTURE  LIPASE, BLOOD  URINALYSIS, ROUTINE W REFLEX MICROSCOPIC  TSH  POC OCCULT BLOOD, ED  TYPE AND SCREEN  PREPARE RBC (CROSSMATCH)  TROPONIN I (HIGH SENSITIVITY)  TROPONIN I (HIGH SENSITIVITY)    EKG EKG Interpretation  Date/Time:  Thursday April 30 2019 11:41:45 EST Ventricular Rate:  91 PR Interval:    QRS Duration: 87 QT Interval:  375 QTC Calculation: 462 R Axis:   68 Text Interpretation: Sinus rhythm Atrial premature complex Borderline T wave abnormalities When compared to prior, no significiant changes seen. No STEMI Confirmed by Antony Blackbird [phone]) on 04/30/2019 12:13:55 PM   Radiology DG Chest Portable 1 View  Result Date: 04/30/2019 CLINICAL DATA:  Shortness of breath EXAM: PORTABLE CHEST 1 VIEW COMPARISON:  None. FINDINGS: There is mild atelectasis in the right base. The lungs elsewhere are clear. Heart is mildly enlarged with pulmonary vascularity normal. No adenopathy. There is aortic atherosclerosis. No bone lesions. IMPRESSION: Mild atelectasis right base. No edema or consolidation. Heart mildly enlarged. No adenopathy. Aortic Atherosclerosis (ICD10-I70.0). Electronically Signed   By: Lowella Grip III M.D.   On: 04/30/2019 12:04    Procedures Procedures (including critical care time)  CRITICAL CARE Performed by: Gwenyth Allegra Lakrisha Iseman Total critical care time: 30 minutes Critical care time was exclusive of separately billable procedures and treating other patients. Critical care was necessary to treat or prevent imminent or life-threatening deterioration. Critical care was time spent personally by me on the following activities: development of treatment plan with patient and/or surrogate as well as nursing, discussions with consultants, evaluation of patient's response to  treatment, examination of patient, obtaining history from patient or surrogate, ordering and performing treatments and  interventions, ordering and review of laboratory studies, ordering and review of radiographic studies, pulse oximetry and re-evaluation of patient's condition.   Medications Ordered in ED Medications  LORazepam (ATIVAN) injection 0.5 mg (0.5 mg Intravenous Given 04/30/19 1259)  0.9 %  sodium chloride infusion (10 mL/hr Intravenous New Bag/Given 04/30/19 1415)    ED Course  I have reviewed the triage vital signs and the nursing notes.  Pertinent labs & imaging results that were available during my care of the patient were reviewed by me and considered in my medical decision making (see chart for details).    MDM Rules/Calculators/A&P                      Gari Veith is a 68 y.o. 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  Patient reports that she has had progressive exertional shortness of breath and fatigue for the last month or so but has not had any visible bleeding.  She denies any hematemesis or bright rectal bleeding.  She denies any dark stools.  She does report she has had around 8 or 9 upper GI bleeds in the past.  She reports that she saw her PCP yesterday who did a hemoglobin and found her to have a hemoglobin of 6.4.  They instructed her to come to the emergency department for transfusion and evaluation.  She does report that she has had the progressive exertional shortness of breath but she is still able to get around and has not had any syncopal episodes.  She chronically feels lightheaded.  She reports she has had some new nausea and some abdominal bloating feeling but will not report true abdominal pain.  She denies any  fevers, chills, congestion, or cough.  She denies any Covid symptoms.  She denies any urinary symptoms.  She denies other complaints.  On exam, chest is nontender and abdomen is nontender.  Lungs are clear.  Back is nontender.  Patient moving all extremities.  Patient does look slightly pale.  Patient resting comfortably with reassuring vital signs on arrival in my evaluation.  We will do fecal occult test with chaperone.  We will repeat blood work as well as look for other sources of exertional shortness of breath, fatigue, and low energy.  Will get work-up for occult infection, electrolyte imbalance, thyroid problem, and will also do a fecal occult test.  I suspect her anemia is from a small upper GI bleed with her nausea and abdominal discomfort.  If patient is still having hemoglobin in the sixes, we will likely provide a unit of blood.  Patient reports she stopped her Eliquis yesterday and will follow up with her PCP.  Patient would rather go home if possible after transfusion rather than come in.  If her hemoglobin is in the fives, she will likely need admission.  If we see any other abnormalities, we may consider admission.  Will discuss further management after hemoglobin and work-up has returned.  Fecal occult was performed with a chaperone and was negative.  She has hemorrhoids but no active bleeding seen.  1:18 PM Hemoglobin just returned at 6.1.  This is a decrease from yesterday.  When I went in to tell the patient, her oxygen saturation was 88 while she was resting.  I  offered her transfusion and admission for symptomatic anemia with the fatigue, hypoxia, exertional shortness of breath, and her other medical comorbidities however she was still like to try and go home if she is feeling better.  We will give 2 units of blood and check her hemoglobin after.  We will then ambulate her and see what her oxygen saturations do and how she is feeling.  If she still is not hypoxic again and is  feeling better, we may have a shared decision-making decision conversation about going home.  Patient's hemoglobin after transfusion was 7.9.  She was able to ambulate around the emergency department without any shortness of breath, lightheadedness, or syncope.  She was feeling much better.  Patient was informed that her kidney function is elevated, BNP is elevated, and I did see hypoxia at 1 point but she would rather go home.  She understands the risks of discharge but will follow with PCP in the next day or 2.  She understands return precautions and was discharged in good condition with improvement in anemia.  Final Clinical Impression(s) / ED Diagnoses Final diagnoses:  Symptomatic anemia  Exertional shortness of breath  AKI (acute kidney injury) ([place])  Elevated brain natriuretic peptide (BNP) level  Hypoxia    Rx / DC Orders ED Discharge Orders    None     Clinical Impression: 1. Symptomatic anemia   2. Exertional shortness of breath   3. AKI (acute kidney injury) ([facility])   4. Elevated brain natriuretic peptide (BNP) level   5. Hypoxia     Disposition: Discharge  Condition: Good  I have discussed the results, Dx and Tx plan with the pt(& family if present). He/she/they expressed understanding and agree(s) with the plan. Discharge instructions discussed at great length. Strict return precautions discussed and pt &/or family have verbalized understanding of the instructions. No further questions at time of discharge.    New Prescriptions   No medications on file    Follow Up: [facility] [address] [place] [phone] Schedule an appointment as soon as possible for a visit    [facility] [address] [id number] [place]       Evi Mccomb, Gwenyth Allegra, MD 04/30/19 2113

## 2019-04-30 NOTE — ED Notes (Signed)
Pt placed on bedpan

## 2019-05-01 DIAGNOSIS — Z9289 Personal history of other medical treatment: Secondary | ICD-10-CM

## 2019-05-01 HISTORY — DX: Personal history of other medical treatment: Z92.89

## 2019-05-01 HISTORY — PX: COLONOSCOPY WITH ESOPHAGOGASTRODUODENOSCOPY (EGD): SHX5779

## 2019-05-01 LAB — URINE CULTURE

## 2019-05-01 LAB — TYPE AND SCREEN
ABO/RH(D): A POS
Antibody Screen: NEGATIVE
Unit division: 0
Unit division: 0

## 2019-05-01 LAB — BPAM RBC
Blood Product Expiration Date: 202101262359
Blood Product Expiration Date: 202101262359
ISSUE DATE / TIME: 202012311345
ISSUE DATE / TIME: 202012311749
Unit Type and Rh: 6200
Unit Type and Rh: 6200

## 2019-05-15 ENCOUNTER — Ambulatory Visit: Payer: Medicare HMO | Admitting: Gastroenterology

## 2019-05-22 ENCOUNTER — Encounter: Payer: Self-pay | Admitting: Internal Medicine

## 2019-05-22 ENCOUNTER — Other Ambulatory Visit (INDEPENDENT_AMBULATORY_CARE_PROVIDER_SITE_OTHER): Payer: Medicare HMO

## 2019-05-22 ENCOUNTER — Ambulatory Visit: Payer: Medicare HMO | Admitting: Internal Medicine

## 2019-05-22 VITALS — BP 110/60 | HR 80 | Temp 98.2°F | Ht 62.75 in | Wt 236.4 lb

## 2019-05-22 DIAGNOSIS — E119 Type 2 diabetes mellitus without complications: Secondary | ICD-10-CM

## 2019-05-22 DIAGNOSIS — Z794 Long term (current) use of insulin: Secondary | ICD-10-CM

## 2019-05-22 DIAGNOSIS — D509 Iron deficiency anemia, unspecified: Secondary | ICD-10-CM | POA: Diagnosis not present

## 2019-05-22 DIAGNOSIS — Z01818 Encounter for other preprocedural examination: Secondary | ICD-10-CM | POA: Diagnosis not present

## 2019-05-22 LAB — CBC WITH DIFFERENTIAL/PLATELET
Basophils Absolute: 0.1 10*3/uL (ref 0.0–0.1)
Basophils Relative: 0.8 % (ref 0.0–3.0)
Eosinophils Absolute: 0.1 10*3/uL (ref 0.0–0.7)
Eosinophils Relative: 1.3 % (ref 0.0–5.0)
HCT: 33.7 % — ABNORMAL LOW (ref 36.0–46.0)
Hemoglobin: 10.1 g/dL — ABNORMAL LOW (ref 12.0–15.0)
Lymphocytes Relative: 17.1 % (ref 12.0–46.0)
Lymphs Abs: 1.4 10*3/uL (ref 0.7–4.0)
MCHC: 30 g/dL (ref 30.0–36.0)
MCV: 78.3 fl (ref 78.0–100.0)
Monocytes Absolute: 0.7 10*3/uL (ref 0.1–1.0)
Monocytes Relative: 8.4 % (ref 3.0–12.0)
Neutro Abs: 6.1 10*3/uL (ref 1.4–7.7)
Neutrophils Relative %: 72.4 % (ref 43.0–77.0)
Platelets: 274 10*3/uL (ref 150.0–400.0)
RBC: 4.3 Mil/uL (ref 3.87–5.11)
RDW: 24.7 % — ABNORMAL HIGH (ref 11.5–15.5)
WBC: 8.4 10*3/uL (ref 4.0–10.5)

## 2019-05-22 MED ORDER — NA SULFATE-K SULFATE-MG SULF 17.5-3.13-1.6 GM/177ML PO SOLN: 1.0000 | Freq: Once | ORAL | 0 refills | Status: AC

## 2019-05-22 NOTE — Progress Notes (Signed)
HISTORY OF PRESENT ILLNESS:  Tonya Dougherty is a 69 y.o. female with past medical history as listed below, including morbid obesity, diabetes mellitus, hypertension, history of DVT, and renal insufficiency, who was sent by her primary care provider Dr. Maceo Pro up [facility] health regarding iron deficiency anemia.  The patient is accompanied by her granddaughter.  Multiple scattered records have been reviewed.  Patient's current history is that of progressive fatigue and shortness of breath.  Evaluation by her primary care provider on April 29, 2019 revealed iron deficiency anemia with a hemoglobin of 6.4, low ferritin.  At that time she was on Eliquis for history of DVT x3 including portal vein thrombosis.  She was told to stop her Eliquis and follow-up with hematology.  She was also sent to the [facility] emergency department for blood transfusion.  She received 2 units of blood.  Posttransfusion hemoglobin was 7.9.  Comprehensive metabolic panel was remarkable for elevated glucose and renal insufficiency with GFR 44.  Patient states that her symptoms have improved since her transfusions.  She remains off anticoagulation per her PCP.  She is on oral iron.  Patient tells me that about 7 or 8 years ago she had issues with hematemesis.  She is not sure from what.  She tells me that she required intensive care unit care.  She denies having had upper endoscopy, though I think this is unlikely.  Her care was elsewhere.  She tells me that she takes Prilosec 20 mg daily and has been on that for quite some time.  This controls classic reflux symptoms.  She thinks she has a hiatal hernia.  She also complains of bloating and gas.  Since being diagnosed with diabetes she is lost 4 pounds.  She denies melena or hematochezia.  Bowel habits were regular until starting iron.  Now constipated for which she is using MiraLAX.  She has never had colonoscopy.  Previous smoker.  Occasional NSAIDs for arthritis  REVIEW OF  SYSTEMS:  All non-GI ROS negative unless otherwise stated in the HPI except for fatigue, sinus and allergy trouble, sleeping problems  Past Medical History:  Diagnosis Date  . Allergic rhinitis   . Anemia   . Anxiety   . Arthritis   . Bell's palsy   . Depression   . Diabetes mellitus without complication ([place])   . DVT (deep venous thrombosis) ([place])   . GERD (gastroesophageal reflux disease)   . GI bleed   . Hepatitis B   . Hypertension   . Neuropathy   . Psoriasis     Past Surgical History:  Procedure Laterality Date  . ANKLE SURGERY Right   . CATARACT EXTRACTION, BILATERAL      Social History Tonya Dougherty  reports that she quit smoking about 18 months ago. Her smoking use included cigarettes. She smoked 0.50 packs per day. She has never used smokeless tobacco. She reports previous drug use. She reports that she does not drink alcohol.  family history includes Alcoholism in her brother; Cirrhosis in her daughter and father; Diabetes in her mother; Drug abuse in her daughter; Hypertension in her brother, mother, sister, and sister; Kidney failure in her brother and mother; Obesity in her mother; Other in her brother.  Allergies  Allergen Reactions  . Xarelto [Rivaroxaban] Rash    Severe rash with itching  . Erythromycin     Oral Thrush  . Lipitor [Atorvastatin]     Leg cramps  . Pravachol [Pravastatin]  Leg muscle cramps  . Ivp Dye [Iodinated Diagnostic Agents] Rash       PHYSICAL EXAMINATION: Vital signs: BP 110/60 (BP Location: Left Arm, Patient Position: Sitting, Cuff Size: Normal)   Pulse 80   Temp 98.2 F (36.8 C)   Ht 5' 2.75" (1.594 m) Comment: height measured without shoes  Wt 236 lb 6 oz (107.2 kg)   BMI 42.21 kg/m   Constitutional: Pleasant, obese, unhealthy appearing, no acute distress Psychiatric: alert and oriented x3, cooperative Eyes: extraocular movements intact, anicteric, conjunctiva pink Mouth: oral pharynx moist, no lesions Neck:  supple no lymphadenopathy Cardiovascular: heart regular rate and rhythm, no murmur Lungs: clear to auscultation bilaterally Abdomen: soft, obese, nontender, nondistended, no obvious ascites, no peritoneal signs, normal bowel sounds, no organomegaly Rectal: Deferred until colonoscopy Extremities: no clubbing or cyanosis.  Trace lower extremity edema bilaterally Skin: no lesions on visible extremities Neuro: No focal deficits.  Cranial nerves intact  ASSESSMENT:  1.  Symptomatic iron deficiency anemia.  Improved after transfusion.  On oral iron.  Rule out GI mucosal lesion such as Cameron erosions, ulcer disease, AVMs, neoplasia. 2.  History of DVT.  Previously on Eliquis.  Now on hold per PCP 3.  Morbid obesity 4.  Insulin requiring diabetes mellitus 5.  Remote history of what sounds like severe upper GI bleeding of uncertain cause   PLAN:  1.  Schedule colonoscopy to evaluate iron deficiency anemia.  The patient is HIGH RISK given her comorbidities, body habitus, and the need to address her insulin therapy preprocedure.The nature of the procedure, as well as the risks, benefits, and alternatives were carefully and thoroughly reviewed with the patient. Ample time for discussion and questions allowed. The patient understood, was satisfied, and agreed to proceed. 2.  Schedule upper endoscopy to evaluate iron deficiency anemia.  The patient is high risk as above.The nature of the procedure, as well as the risks, benefits, and alternatives were carefully and thoroughly reviewed with the patient. Ample time for discussion and questions allowed. The patient understood, was satisfied, and agreed to proceed. 3.  Recommended to take one half of her evening insulin dosage, the evening prior to her procedures. 4.  Hold iron 1 week prior to the procedures in order to assist with preparation 5.  Follow-up CBC today 6.  Ongoing general medical care with PCP A total of 60 minutes were spent preparing to  see the patient, reviewing outside tests and laboratories, reviewing outside office notes, obtaining the history, performing a comprehensive medical examination, counseling and educating the patient and the granddaughter regarding the above listed issues, ordering blood work and reviewing in detail her scheduled procedures, and documenting clinical information in the health record.

## 2019-05-22 NOTE — Patient Instructions (Signed)
You have been scheduled for an endoscopy and colonoscopy. Please follow the written instructions given to you at your visit today. Please pick up your prep supplies at the pharmacy within the next 1-3 days. If you use inhalers (even only as needed), please bring them with you on the day of your procedure.  

## 2019-05-27 ENCOUNTER — Ambulatory Visit (INDEPENDENT_AMBULATORY_CARE_PROVIDER_SITE_OTHER): Payer: Medicare HMO

## 2019-05-27 DIAGNOSIS — Z1159 Encounter for screening for other viral diseases: Secondary | ICD-10-CM

## 2019-05-28 LAB — SARS CORONAVIRUS 2 (TAT 6-24 HRS): SARS Coronavirus 2: NEGATIVE

## 2019-05-29 ENCOUNTER — Other Ambulatory Visit: Payer: Self-pay

## 2019-05-29 ENCOUNTER — Encounter: Payer: Self-pay | Admitting: Internal Medicine

## 2019-05-29 ENCOUNTER — Ambulatory Visit (AMBULATORY_SURGERY_CENTER): Payer: Medicare HMO | Admitting: Internal Medicine

## 2019-05-29 ENCOUNTER — Other Ambulatory Visit (INDEPENDENT_AMBULATORY_CARE_PROVIDER_SITE_OTHER): Payer: Medicare HMO

## 2019-05-29 VITALS — BP 101/77 | HR 69 | Temp 97.1°F | Resp 17 | Ht 62.0 in | Wt 236.0 lb

## 2019-05-29 DIAGNOSIS — C187 Malignant neoplasm of sigmoid colon: Secondary | ICD-10-CM | POA: Diagnosis not present

## 2019-05-29 DIAGNOSIS — D124 Benign neoplasm of descending colon: Secondary | ICD-10-CM

## 2019-05-29 DIAGNOSIS — K5730 Diverticulosis of large intestine without perforation or abscess without bleeding: Secondary | ICD-10-CM | POA: Diagnosis not present

## 2019-05-29 DIAGNOSIS — K317 Polyp of stomach and duodenum: Secondary | ICD-10-CM

## 2019-05-29 DIAGNOSIS — D120 Benign neoplasm of cecum: Secondary | ICD-10-CM

## 2019-05-29 DIAGNOSIS — D125 Benign neoplasm of sigmoid colon: Secondary | ICD-10-CM

## 2019-05-29 DIAGNOSIS — D509 Iron deficiency anemia, unspecified: Secondary | ICD-10-CM

## 2019-05-29 DIAGNOSIS — D123 Benign neoplasm of transverse colon: Secondary | ICD-10-CM

## 2019-05-29 LAB — CBC WITH DIFFERENTIAL/PLATELET
Basophils Absolute: 0.1 10*3/uL (ref 0.0–0.1)
Basophils Relative: 0.9 % (ref 0.0–3.0)
Eosinophils Absolute: 0.1 10*3/uL (ref 0.0–0.7)
Eosinophils Relative: 1.4 % (ref 0.0–5.0)
HCT: 33.7 % — ABNORMAL LOW (ref 36.0–46.0)
Hemoglobin: 10.1 g/dL — ABNORMAL LOW (ref 12.0–15.0)
Lymphocytes Relative: 20.3 % (ref 12.0–46.0)
Lymphs Abs: 1.3 10*3/uL (ref 0.7–4.0)
MCHC: 30 g/dL (ref 30.0–36.0)
MCV: 78.3 fl (ref 78.0–100.0)
Monocytes Absolute: 0.5 10*3/uL (ref 0.1–1.0)
Monocytes Relative: 7.2 % (ref 3.0–12.0)
Neutro Abs: 4.6 10*3/uL (ref 1.4–7.7)
Neutrophils Relative %: 70.2 % (ref 43.0–77.0)
Platelets: 158 10*3/uL (ref 150.0–400.0)
RBC: 4.31 Mil/uL (ref 3.87–5.11)
RDW: 23.4 % — ABNORMAL HIGH (ref 11.5–15.5)
WBC: 6.6 10*3/uL (ref 4.0–10.5)

## 2019-05-29 LAB — COMPREHENSIVE METABOLIC PANEL
ALT: 10 U/L (ref 0–35)
AST: 18 U/L (ref 0–37)
Albumin: 3.6 g/dL (ref 3.5–5.2)
Alkaline Phosphatase: 147 U/L — ABNORMAL HIGH (ref 39–117)
BUN: 13 mg/dL (ref 6–23)
CO2: 30 mEq/L (ref 19–32)
Calcium: 9.1 mg/dL (ref 8.4–10.5)
Chloride: 102 mEq/L (ref 96–112)
Creatinine, Ser: 0.84 mg/dL (ref 0.40–1.20)
GFR: 67.21 mL/min (ref >60.00)
Glucose, Bld: 91 mg/dL (ref 70–99)
Potassium: 3.5 mEq/L (ref 3.5–5.1)
Sodium: 138 mEq/L (ref 135–145)
Total Bilirubin: 0.7 mg/dL (ref 0.2–1.2)
Total Protein: 7.3 g/dL (ref 6.0–8.3)

## 2019-05-29 LAB — CEA: CEA: 5 ng/mL — ABNORMAL HIGH

## 2019-05-29 MED ORDER — SODIUM CHLORIDE 0.9 % IV SOLN: 500.0000 mL | Freq: Once | INTRAVENOUS | Status: DC

## 2019-05-29 NOTE — Patient Instructions (Addendum)
Thank you for letting us take care of your healthcare needs today!  Please see educational sheets for more information.  YOU HAD AN ENDOSCOPIC PROCEDURE TODAY AT [facility]:   Refer to the procedure report that was given to you for any specific questions about what was found during the examination.  If the procedure report does not answer your questions, please call your gastroenterologist to clarify.  If you requested that your care partner not be given the details of your procedure findings, then the procedure report has been included in a sealed envelope for you to review at your convenience later.  YOU SHOULD EXPECT: Some feelings of bloating in the abdomen. Passage of more gas than usual.  Walking can help get rid of the air that was put into your GI tract during the procedure and reduce the bloating. If you had a lower endoscopy (such as a colonoscopy or flexible sigmoidoscopy) you may notice spotting of blood in your stool or on the toilet paper. If you underwent a bowel prep for your procedure, you may not have a normal bowel movement for a few days.  Please Note:  You might notice some irritation and congestion in your nose or some drainage.  This is from the oxygen used during your procedure.  There is no need for concern and it should clear up in a day or so.  SYMPTOMS TO REPORT IMMEDIATELY:   Following lower endoscopy (colonoscopy or flexible sigmoidoscopy):  Excessive amounts of blood in the stool  Significant tenderness or worsening of abdominal pains  Swelling of the abdomen that is new, acute  Fever of 100F or higher   Following upper endoscopy (EGD)  Vomiting of blood or coffee ground material  New chest pain or pain under the shoulder blades  Painful or persistently difficult swallowing  New shortness of breath  Fever of 100F or higher  Black, tarry-looking stools  For urgent or emergent issues, a gastroenterologist can be reached at any hour by calling  [phone].   DIET:  We do recommend a small meal at first, but then you may proceed to your regular diet.  Drink plenty of fluids but you should avoid alcoholic beverages for 24 hours.  ACTIVITY:  You should plan to take it easy for the rest of today and you should NOT DRIVE or use heavy machinery until tomorrow (because of the sedation medicines used during the test).    FOLLOW UP: Our staff will call the number listed on your records 48-72 hours following your procedure to check on you and address any questions or concerns that you may have regarding the information given to you following your procedure. If we do not reach you, we will leave a message.  We will attempt to reach you two times.  During this call, we will ask if you have developed any symptoms of COVID 19. If you develop any symptoms (ie: fever, flu-like symptoms, shortness of breath, cough etc.) before then, please call [phone].  If you test positive for Covid 19 in the 2 weeks post procedure, please call and report this information to [place].    If any biopsies were taken you will be contacted by phone or by letter within the next 1-3 weeks.  Please call us at [phone] if you have not heard about the biopsies in 3 weeks.   SIGNATURES/CONFIDENTIALITY: You and/or your care partner have signed paperwork which will be entered into your electronic medical record.  These  signatures attest to the fact that that the information above on your After Visit Summary has been reviewed and is understood.  Full responsibility of the confidentiality of this discharge information lies with you and/or your care-partner. 

## 2019-05-29 NOTE — Op Note (Signed)
[place] Patient Name: Tonya Dougherty Procedure Date: 05/29/2019 1:22 PM MRN: [id number] Endoscopist: Docia Chuck. Henrene Pastor , MD Age: 69 Referring MD:  Date of Birth: 1950/12/20 Gender: Female Account #: [id number] Procedure:                Colonoscopy with cold snare polypectomy x7; biopsy;                            submucosal injection Indications:              Iron deficiency anemia Medicines:                Monitored Anesthesia Care Procedure:                Pre-Anesthesia Assessment:                           - Prior to the procedure, a History and Physical                            was performed, and patient medications and                            allergies were reviewed. The patient's tolerance of                            previous anesthesia was also reviewed. The risks                            and benefits of the procedure and the sedation                            options and risks were discussed with the patient.                            All questions were answered, and informed consent                            was obtained. Prior Anticoagulants: The patient has                            taken no previous anticoagulant or antiplatelet                            agents. ASA Grade Assessment: II - A patient with                            mild systemic disease. After reviewing the risks                            and benefits, the patient was deemed in                            satisfactory condition to undergo the procedure.  After obtaining informed consent, the colonoscope                            was passed under direct vision. Throughout the                            procedure, the patient's blood pressure, pulse, and                            oxygen saturations were monitored continuously. The                            Colonoscope was introduced through the anus and                            advanced to the the cecum,  identified by                            appendiceal orifice and ileocecal valve. The                            ileocecal valve, appendiceal orifice, and rectum                            were photographed. The quality of the bowel                            preparation was excellent. The colonoscopy was                            performed without difficulty. The patient tolerated                            the procedure well. The bowel preparation used was                            SUPREP via split dose instruction. Scope In: 1:32:24 PM Scope Out: 2:10:11 PM Scope Withdrawal Time: 0 hours 24 minutes 53 seconds  Total Procedure Duration: 0 hours 37 minutes 47 seconds  Findings:                 An ulcerated partially obstructing large mass was                            found in the distal sigmoid colon. This was located                            at 25 cm from the anal verge. The mass was                            circumferential and friable. This was biopsied with                            a cold forceps for histology.  Area was tattooed                            with an injection of Spot (carbon black) just at                            the distal (toward the rectum) tumor margin on one                            side and about 1 to 2 cm distal to the tumor margin                            on the opposite side. See images.                           Seven polyps were found in the descending colon,                            transverse colon and cecum. The polyps were 3 to 10                            mm in size. These polyps were removed with a cold                            snare. Resection and retrieval were complete.                           Multiple diverticula were found in the sigmoid                            colon.                           The appendiceal orifice was somewhat prominent and                            appeared to have mucus emanating.                            The exam was otherwise without abnormality on                            direct and retroflexion views. Complications:            No immediate complications. Estimated blood loss:                            None. Estimated Blood Loss:     Estimated blood loss: none. 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 Biopsied.                           -  Seven 3 to 10 mm polyps in the descending colon,                            in the transverse colon and in the cecum, removed                            with a cold snare. Resected and retrieved.                           - Diverticulosis in the sigmoid colon.                           - Prominent appendiceal orifice with mucin. Rule                            out mucinous tumor                           - The examination was otherwise normal on direct                            and retroflexion views. Recommendation:           1. Repeat colonoscopy in 1 year for surveillance.                           2. Patient has a contact number available for                            emergencies. The signs and symptoms of potential                            delayed complications were discussed with the                            patient. Return to normal activities tomorrow.                            Written discharge instructions were provided to the                            patient.                           3. Await pathology                           4. Schedule contrast-enhanced CT scan of the chest,                            abdomen, and pelvis "sigmoid colon cancer, rule out                            metastatic disease; evaluate the appendix as well"  5. CBC, c-Met, and CEA level today                           6. Schedule surgical consult for newly diagnosed                            sigmoid colon cancer.                           7. Refer  patient to the GI oncology cancer                            coordinator Docia Chuck. Henrene Pastor, MD 05/29/2019 2:38:24 PM This report has been signed electronically.

## 2019-05-29 NOTE — Progress Notes (Signed)
Called to room to assist during endoscopic procedure.  Patient ID and intended procedure confirmed with present staff. Received instructions for my participation in the procedure from the performing physician.  

## 2019-05-29 NOTE — Progress Notes (Signed)
Temp by JB and vitals by CW

## 2019-05-29 NOTE — Progress Notes (Signed)
Pt tolerated well. VSS. Awake and to recovery. 

## 2019-05-29 NOTE — Progress Notes (Signed)
Spoke with Magda Paganini and Magda Paganini ordered labs for pt today.  Vaughan Basta will follow up with CT scan scheduling.  Contrast provided to pt.

## 2019-05-29 NOTE — Op Note (Signed)
[facility] Patient Name: Tonya Dougherty Procedure Date: 05/29/2019 1:21 PM MRN: [id number] Endoscopist: Docia Chuck. Henrene Pastor , MD Age: 69 Referring MD:  Date of Birth: [birth year] Gender: Female Account #: [id number] Procedure:                Upper GI endoscopy with biopsies Indications:              Iron deficiency anemia Medicines:                Monitored Anesthesia Care Procedure:                Pre-Anesthesia Assessment:                           - Prior to the procedure, a History and Physical                            was performed, and patient medications and                            allergies were reviewed. The patient's tolerance of                            previous anesthesia was also reviewed. The risks                            and benefits of the procedure and the sedation                            options and risks were discussed with the patient.                            All questions were answered, and informed consent                            was obtained. Prior Anticoagulants: The patient has                            taken no previous anticoagulant or antiplatelet                            agents. ASA Grade Assessment: II - A patient with                            mild systemic disease. After reviewing the risks                            and benefits, the patient was deemed in                            satisfactory condition to undergo the procedure.                           After obtaining informed consent, the endoscope was  passed under direct vision. Throughout the                            procedure, the patient's blood pressure, pulse, and                            oxygen saturations were monitored continuously. The                            Endoscope was introduced through the mouth, and                            advanced to the second part of duodenum. The upper                            GI endoscopy was  accomplished without difficulty.                            The patient tolerated the procedure well. Scope In: Scope Out: Findings:                 The esophagus was normal.                           The stomach was normal except for a 3 mm antral                            polyp. Biopsies were taken with a cold forceps for                            histology.                           The examined duodenum was normal.                           The cardia and gastric fundus were normal on                            retroflexion. Complications:            No immediate complications. Estimated Blood Loss:     Estimated blood loss: none. Impression:               - Normal esophagus.                           - Small gastric polyp. Biopsied. Otherwise normal                            stomach                           - Normal examined duodenum. Recommendation:           - Patient has a contact number available for  emergencies. The signs and symptoms of potential                            delayed complications were discussed with the                            patient. Return to normal activities tomorrow.                            Written discharge instructions were provided to the                            patient.                           - Resume previous diet.                           - Continue present medications. Resume iron therapy                           - Await pathology                           - See colonoscopy report Docia Chuck. Henrene Pastor, MD 05/29/2019 2:41:03 PM This report has been signed electronically.

## 2019-06-01 ENCOUNTER — Other Ambulatory Visit: Payer: Self-pay

## 2019-06-01 ENCOUNTER — Telehealth: Payer: Self-pay

## 2019-06-01 DIAGNOSIS — C187 Malignant neoplasm of sigmoid colon: Secondary | ICD-10-CM

## 2019-06-01 MED ORDER — PREDNISONE 50 MG PO TABS: ORAL_TABLET | ORAL | 0 refills | Status: DC

## 2019-06-01 MED ORDER — DIPHENHYDRAMINE HCL 50 MG PO TABS: ORAL_TABLET | ORAL | 0 refills | Status: DC

## 2019-06-01 NOTE — Telephone Encounter (Signed)
Pt scheduled for CT of CAP at [facility] 06/05/19@2pm , pt to arrive there at 1:45pm. Pt to be NPO after midniight, drink bottle 1 of contrast at 12N, bottle 2 at 1pm. Pt to premed prior to exam. Script sent in for prednisone 50mg  to be taken at 1am, 7am, 1pm prior to scan, benadryl 50mg  to be taken po 1 hour prior to scan. Pt aware.

## 2019-06-02 ENCOUNTER — Telehealth: Payer: Self-pay | Admitting: *Deleted

## 2019-06-02 NOTE — Telephone Encounter (Signed)
  Follow up Call-  Call back number 05/29/2019  Post procedure Call Back phone  # [phone]  Permission to leave phone message Yes  Some recent data might be hidden     Patient questions:  Do you have a fever, pain , or abdominal swelling? No. Pain Score  0 *  Have you tolerated food without any problems? Yes.    Have you been able to return to your normal activities? Yes.    Do you have any questions about your discharge instructions: Diet   No. Medications  Yes.   Follow up visit  No.  Do you have questions or concerns about your Care? Yes.  -Patient c/o constipation. Stated she is taking Miralax without relief. RN suggested trying a different OTC laxative such as dulcolax to see if that gives her relief. Patient also had questions about why the CT scan was being done. RN advised that this was to further evaluate the tumor found on the colonoscopy and to evaluate for any spread of disease. No further action needed.   Actions: * If pain score is 4 or above: No action needed, pain <4.  1. Have you developed a fever since your procedure? no  2.   Have you had an respiratory symptoms (SOB or cough) since your procedure? no  3.   Have you tested positive for COVID 19 since your procedure no  4.   Have you had any family members/close contacts diagnosed with the COVID 19 since your procedure?  no   If yes to any of these questions please route to Joylene John, RN and Alphonsa Gin, Therapist, sports.

## 2019-06-04 ENCOUNTER — Telehealth: Payer: Self-pay | Admitting: Internal Medicine

## 2019-06-04 ENCOUNTER — Encounter: Payer: Self-pay | Admitting: Internal Medicine

## 2019-06-04 NOTE — Telephone Encounter (Signed)
Prescription called to pharmacy and pt aware.

## 2019-06-04 NOTE — Telephone Encounter (Signed)
Pt called and states the pharmacy did not receive the script for the prednisone for her CT on 06/05/19.

## 2019-06-05 ENCOUNTER — Telehealth: Payer: Self-pay | Admitting: Internal Medicine

## 2019-06-05 ENCOUNTER — Other Ambulatory Visit: Payer: Self-pay

## 2019-06-05 ENCOUNTER — Encounter (HOSPITAL_COMMUNITY): Payer: Self-pay

## 2019-06-05 ENCOUNTER — Ambulatory Visit (HOSPITAL_COMMUNITY)
Admission: RE | Admit: 2019-06-05 | Discharge: 2019-06-05 | Disposition: A | Discharge status: Home / Self Care | Payer: Medicare HMO | Source: Ambulatory Visit | Attending: Internal Medicine | Admitting: Internal Medicine

## 2019-06-05 DIAGNOSIS — C187 Malignant neoplasm of sigmoid colon: Secondary | ICD-10-CM | POA: Insufficient documentation

## 2019-06-05 IMAGING — CT CT CHEST W/ CM
2 of 5 series · 13 of 36 positions shown, 16 images · IV contrast (OMNIPAQUE)
Comparison: [DATE]

CLINICAL DATA: New diagnosis sigmoid colon cancer, evaluate for
metastatic disease, evaluate appendix

EXAM:
CT CHEST, ABDOMEN, AND PELVIS WITH CONTRAST
TECHNIQUE: Multidetector CT imaging of the chest, abdomen and pelvis was
performed following the standard protocol during bolus
administration of intravenous contrast.
CONTRAST:  80mL OMNIPAQUE IOHEXOL 300 MG/ML SOLN, additional oral
enteric contrast

[Series 2: cap with · axial · 0.79mm/px · z∈[-588,-68]mm · 10 of 128 slices shown, 13 images]
[im 12/128  mediastinal]
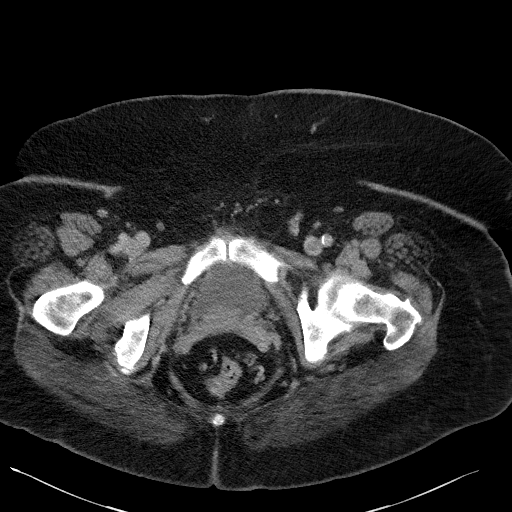
[im 12/128  lung]
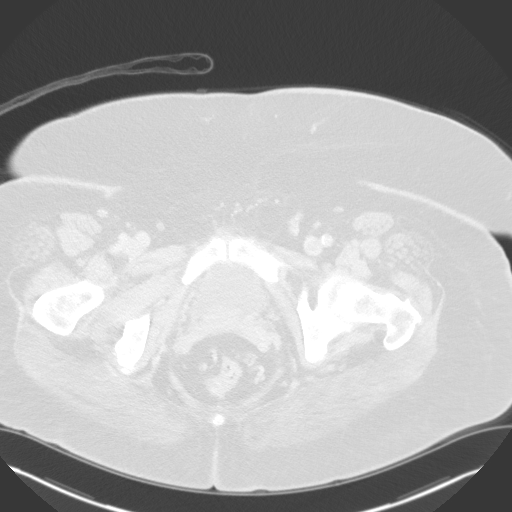
[im 24/128  lung]
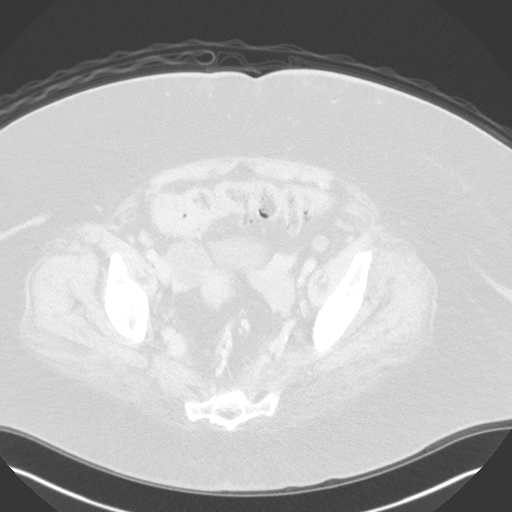
[im 35/128  lung]
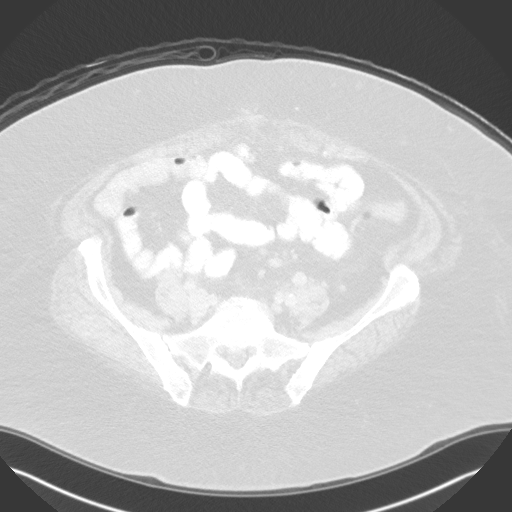
[im 47/128  lung]
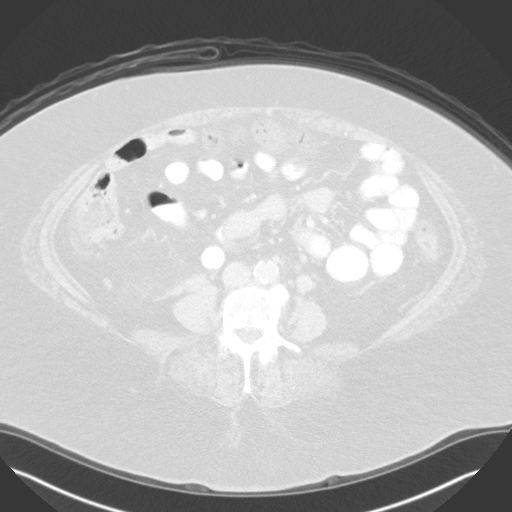
[im 58/128  mediastinal]
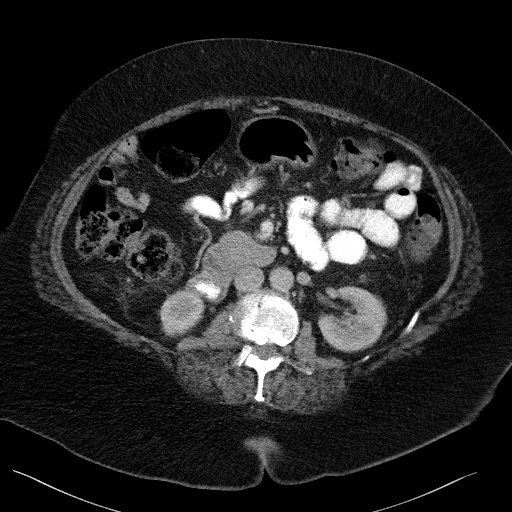
[im 58/128  lung]
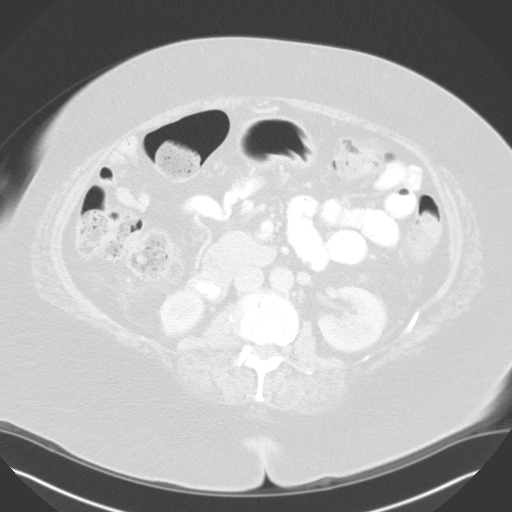
[im 70/128  lung]
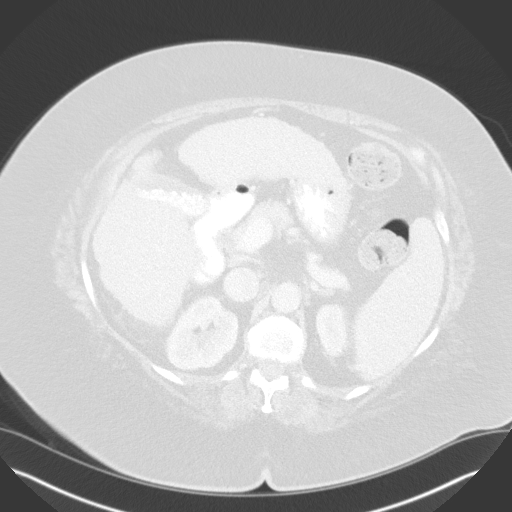
[im 81/128  lung]
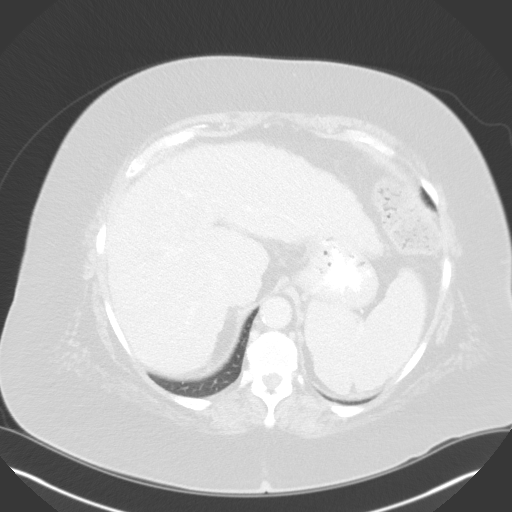
[im 93/128  lung]
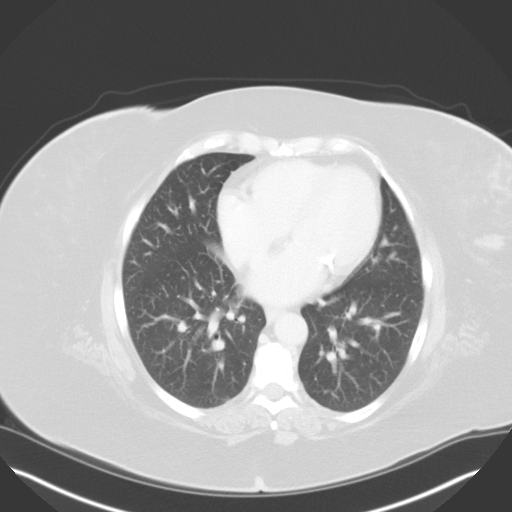
[im 104/128  mediastinal]
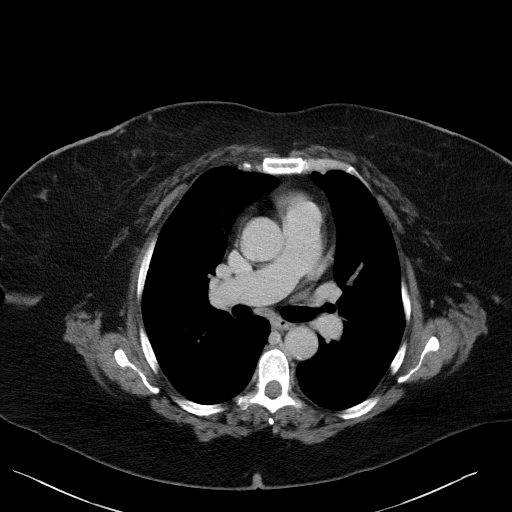
[im 104/128  lung]
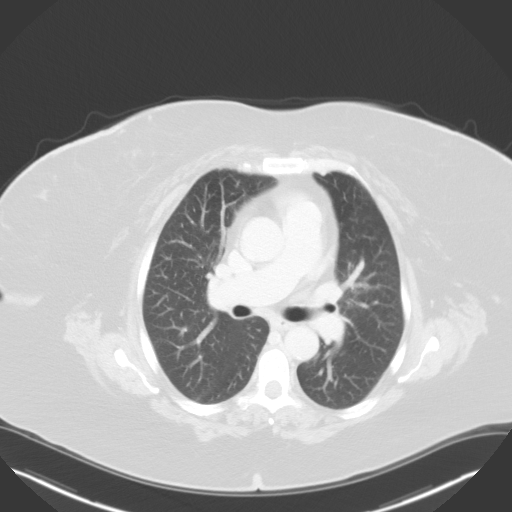
[im 116/128  lung]
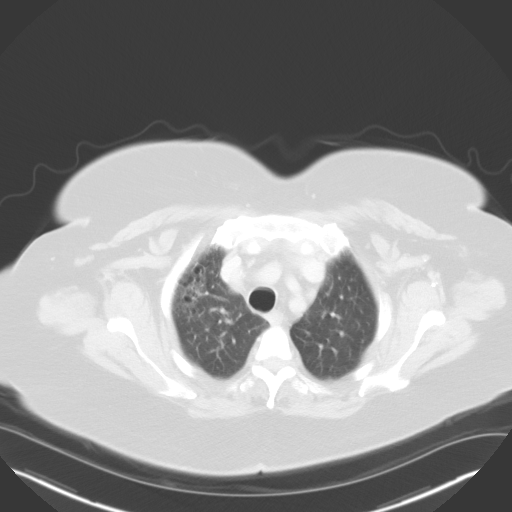

[Series 5: coronals · coronal · 1.03mm/px · 3 of 150 slices shown]
[im 30/150  lung]
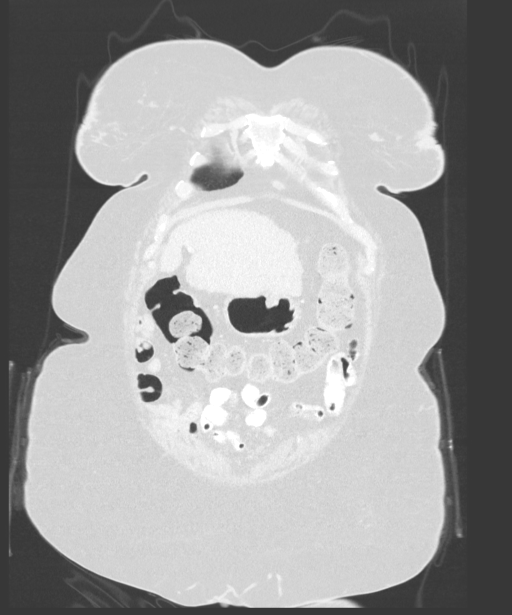
[im 60/150  lung]
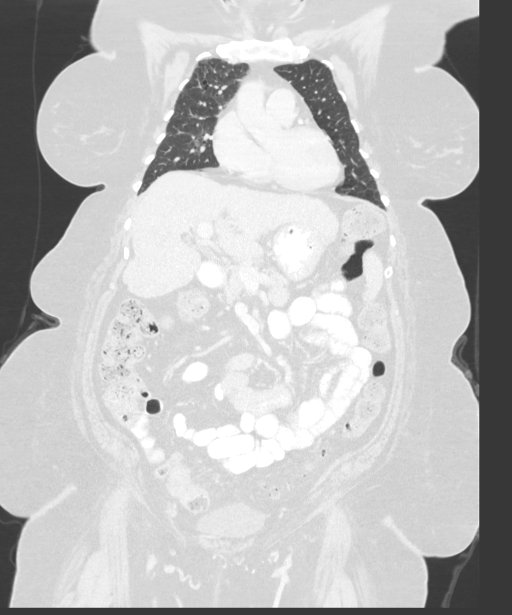
[im 90/150  lung]
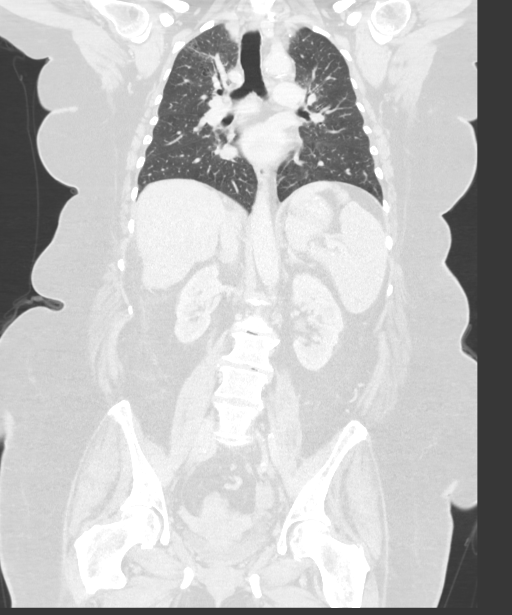

[13 of 36 positions shown; findings below may reference images not displayed]

FINDINGS: CT CHEST FINDINGS

Cardiovascular: Aortic atherosclerosis. Normal heart size.
Three-vessel coronary artery calcifications. No pericardial
effusion.

Mediastinum/Nodes: No enlarged mediastinal, hilar, or axillary lymph
nodes. Enlarged, heterogeneous and nodular thyroid gland containing
calcifications. Trachea, and esophagus demonstrate no significant
findings.

Lungs/Pleura: There is focal fibrotic scarring of the central right
upper lobe, with emphysema like airspace dilatation and some
evidence of architectural distortion (series 7, image 36). No
pleural effusion or pneumothorax.

Musculoskeletal: No chest wall mass or suspicious bone lesions
identified.

CT ABDOMEN PELVIS FINDINGS

Hepatobiliary: Coarse, nodular contour of the liver. Numerous small
gallstones in the gallbladder. No gallbladder wall thickening, or
biliary dilatation.

Pancreas: Unremarkable. No pancreatic ductal dilatation or
surrounding inflammatory changes.

Spleen: Normal in size without significant abnormality.

Adrenals/Urinary Tract: Adrenal glands are unremarkable. Kidneys are
normal, without renal calculi, solid lesion, or hydronephrosis.
Bladder is unremarkable.

Stomach/Bowel: Stomach is within normal limits. Appendix appears
normal. Sigmoid diverticulosis. There is a circumferential mass of
the mid to distal sigmoid (series 2, image 105, series 5, image 53).

Vascular/Lymphatic: Aortic atherosclerosis. No enlarged abdominal or
pelvic lymph nodes.

Reproductive: 3.9 cm fluid attenuation cyst of the right ovary,
enlarged compared to prior examination (series 2, image 104).

Other: No abdominal wall hernia or abnormality. No abdominopelvic
ascites.

Musculoskeletal: No acute or significant osseous findings.
IMPRESSION: 1. Circumferential mass of the mid to distal sigmoid colon, in
keeping with colonoscopy diagnosis of malignancy.
2. No evidence of lymphadenopathy or metastatic disease in the
chest, abdomen or pelvis.
3. Normal appendix per ordering indication.
4. Coarse, nodular contour of the liver, suggestive of cirrhosis.
5. Cholelithiasis.
6. There is a 3.9 cm fluid attenuation cyst of the right ovary,
enlarged compared to prior examination. This is likely a benign cyst
although abnormal in the late postmenopausal setting. Attention on
follow-up.
7. Focal post infectious or inflammatory scarring of the right upper
lobe.
8. Coronary artery disease. Aortic Atherosclerosis ([B2]-[B2]).
9. Enlarged, heterogeneous and nodular thyroid gland containing
calcifications. Further evaluation with thyroid ultrasound is
recommended when clinically appropriate. This follows ACR consensus
guidelines: Managing Incidental Thyroid Nodules Detected on Imaging:
White Paper of [REDACTED]. [HOSPITAL] [B2]; [DATE].

## 2019-06-05 IMAGING — CT CT ABD-PELV W/ CM
2 of 4 series · 14 of 36 positions shown, 17 images · IV contrast (OMNIPAQUE)
Comparison: [DATE]

CLINICAL DATA: New diagnosis sigmoid colon cancer, evaluate for
metastatic disease, evaluate appendix

EXAM:
CT CHEST, ABDOMEN, AND PELVIS WITH CONTRAST
TECHNIQUE: Multidetector CT imaging of the chest, abdomen and pelvis was
performed following the standard protocol during bolus
administration of intravenous contrast.
CONTRAST:  80mL OMNIPAQUE IOHEXOL 300 MG/ML SOLN, additional oral
enteric contrast

[Series 2: cap with · axial · 0.79mm/px · z∈[-618,-38]mm · 11 of 128 slices shown, 14 images]
[im 6/128  mediastinal]
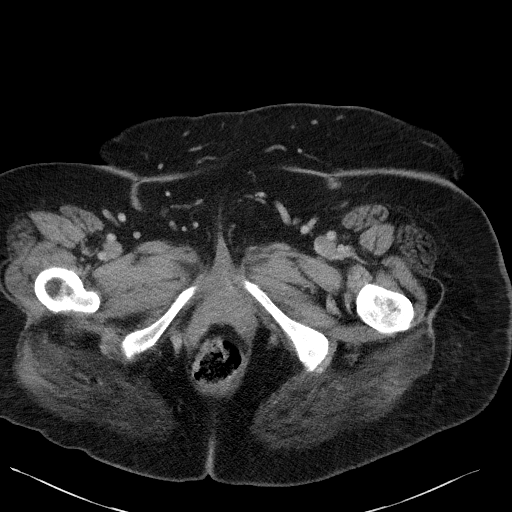
[im 6/128  lung]
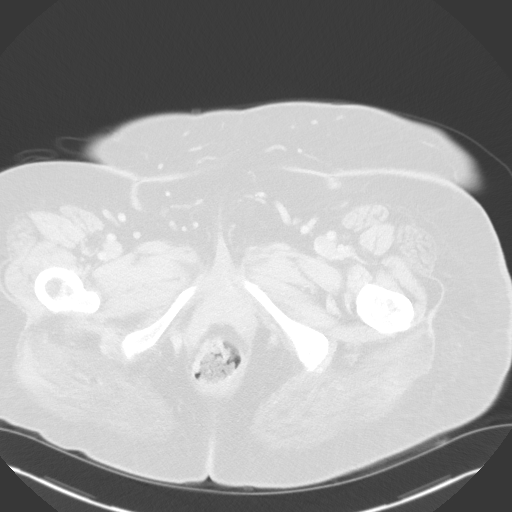
[im 18/128  lung]
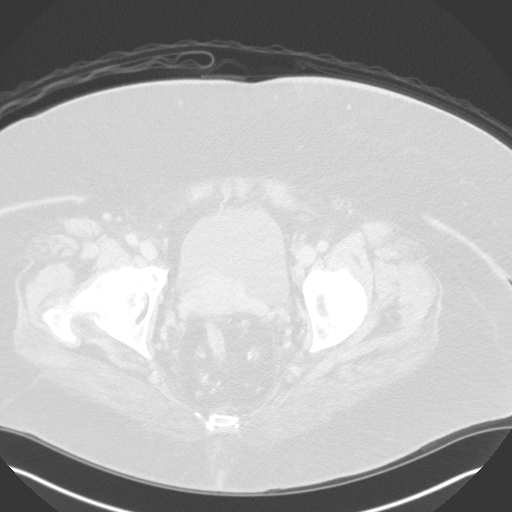
[im 29/128  lung]
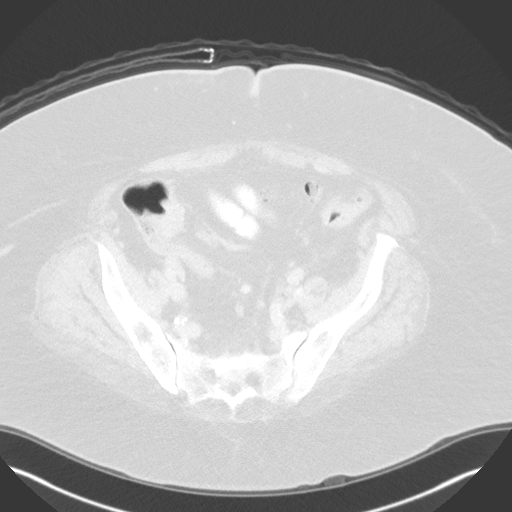
[im 41/128  lung]
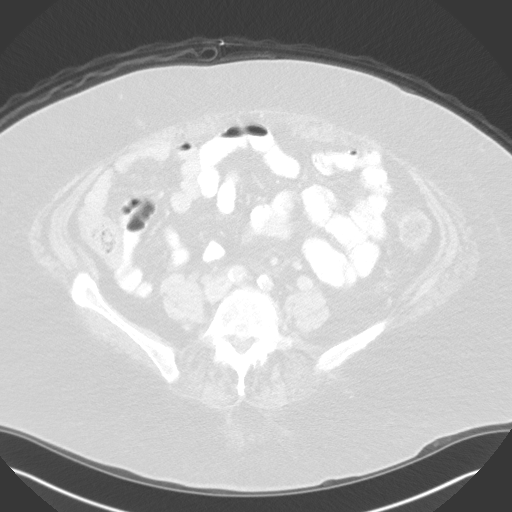
[im 52/128  mediastinal]
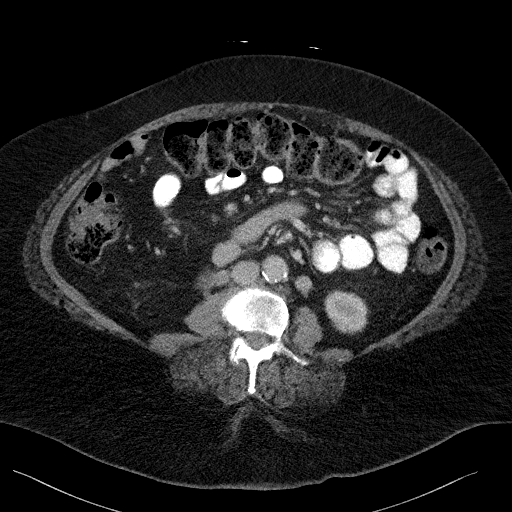
[im 52/128  lung]
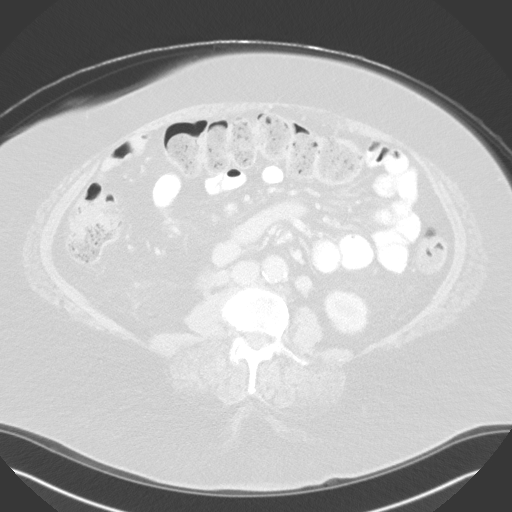
[im 64/128  lung]
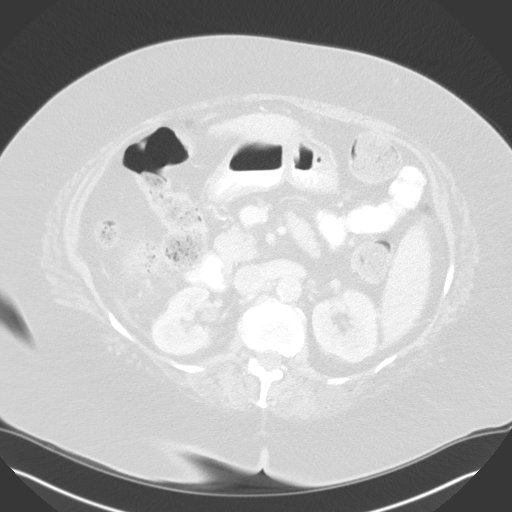
[im 76/128  lung]
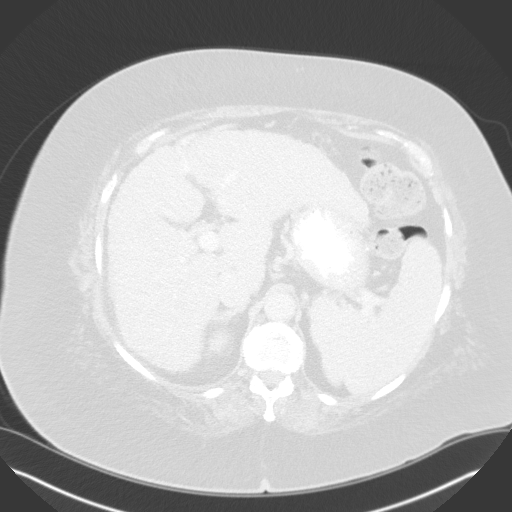
[im 87/128  lung]
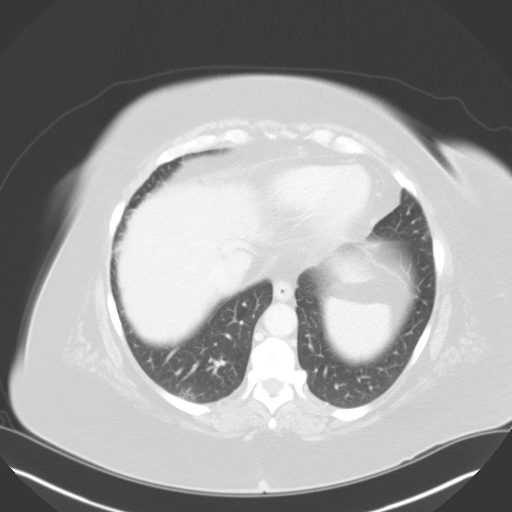
[im 99/128  mediastinal]
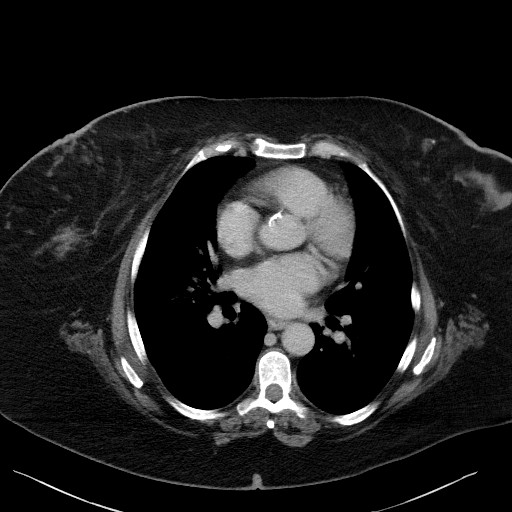
[im 99/128  lung]
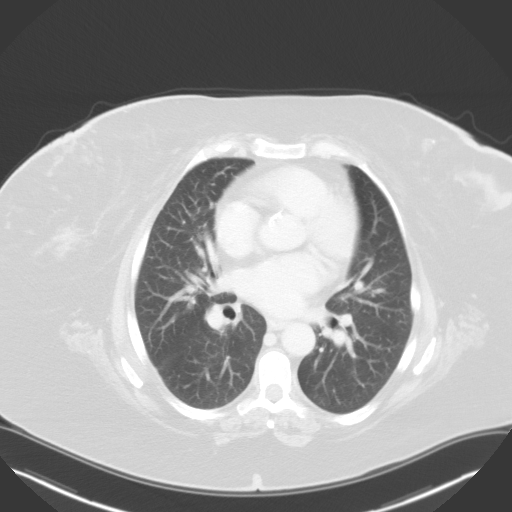
[im 110/128  lung]
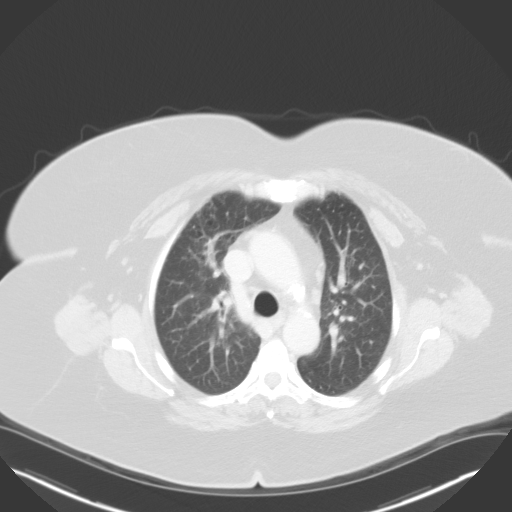
[im 122/128  lung]
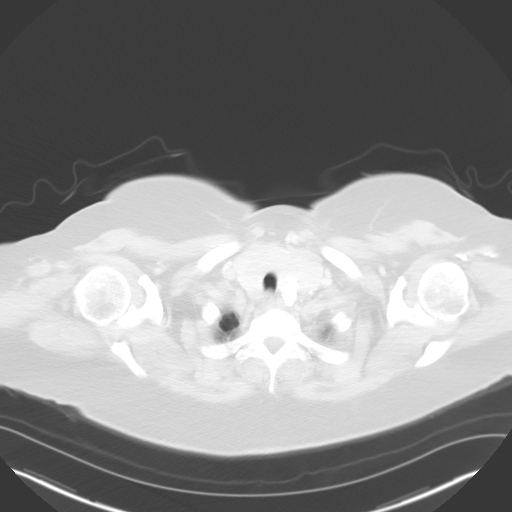

[Series 5: coronals · coronal · 1.03mm/px · 3 of 150 slices shown]
[im 30/150  lung]
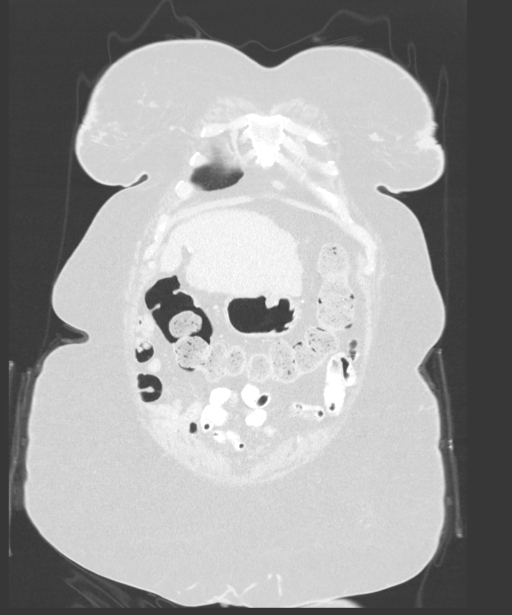
[im 60/150  lung]
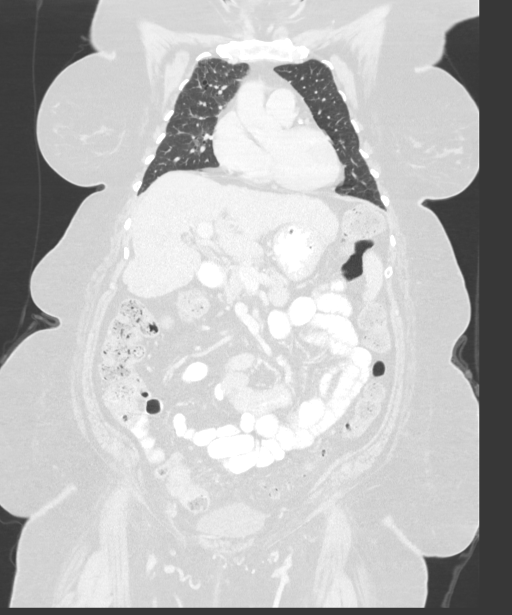
[im 90/150  lung]
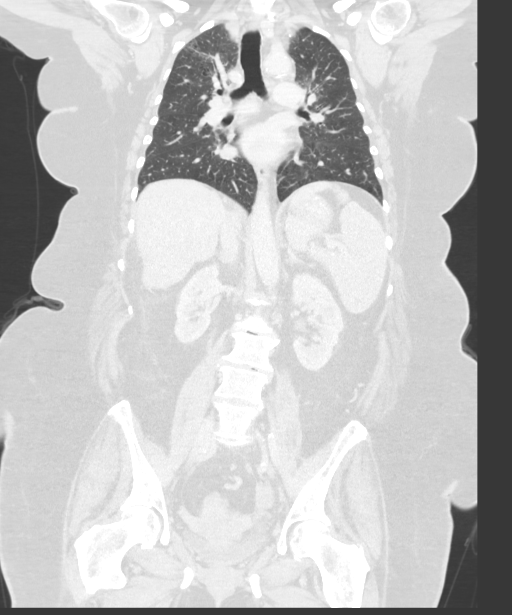

[14 of 36 positions shown; findings below may reference images not displayed]

FINDINGS: CT CHEST FINDINGS

Cardiovascular: Aortic atherosclerosis. Normal heart size.
Three-vessel coronary artery calcifications. No pericardial
effusion.

Mediastinum/Nodes: No enlarged mediastinal, hilar, or axillary lymph
nodes. Enlarged, heterogeneous and nodular thyroid gland containing
calcifications. Trachea, and esophagus demonstrate no significant
findings.

Lungs/Pleura: There is focal fibrotic scarring of the central right
upper lobe, with emphysema like airspace dilatation and some
evidence of architectural distortion (series 7, image 36). No
pleural effusion or pneumothorax.

Musculoskeletal: No chest wall mass or suspicious bone lesions
identified.

CT ABDOMEN PELVIS FINDINGS

Hepatobiliary: Coarse, nodular contour of the liver. Numerous small
gallstones in the gallbladder. No gallbladder wall thickening, or
biliary dilatation.

Pancreas: Unremarkable. No pancreatic ductal dilatation or
surrounding inflammatory changes.

Spleen: Normal in size without significant abnormality.

Adrenals/Urinary Tract: Adrenal glands are unremarkable. Kidneys are
normal, without renal calculi, solid lesion, or hydronephrosis.
Bladder is unremarkable.

Stomach/Bowel: Stomach is within normal limits. Appendix appears
normal. Sigmoid diverticulosis. There is a circumferential mass of
the mid to distal sigmoid (series 2, image 105, series 5, image 53).

Vascular/Lymphatic: Aortic atherosclerosis. No enlarged abdominal or
pelvic lymph nodes.

Reproductive: 3.9 cm fluid attenuation cyst of the right ovary,
enlarged compared to prior examination (series 2, image 104).

Other: No abdominal wall hernia or abnormality. No abdominopelvic
ascites.

Musculoskeletal: No acute or significant osseous findings.
IMPRESSION: 1. Circumferential mass of the mid to distal sigmoid colon, in
keeping with colonoscopy diagnosis of malignancy.
2. No evidence of lymphadenopathy or metastatic disease in the
chest, abdomen or pelvis.
3. Normal appendix per ordering indication.
4. Coarse, nodular contour of the liver, suggestive of cirrhosis.
5. Cholelithiasis.
6. There is a 3.9 cm fluid attenuation cyst of the right ovary,
enlarged compared to prior examination. This is likely a benign cyst
although abnormal in the late postmenopausal setting. Attention on
follow-up.
7. Focal post infectious or inflammatory scarring of the right upper
lobe.
8. Coronary artery disease. Aortic Atherosclerosis ([B2]-[B2]).
9. Enlarged, heterogeneous and nodular thyroid gland containing
calcifications. Further evaluation with thyroid ultrasound is
recommended when clinically appropriate. This follows ACR consensus
guidelines: Managing Incidental Thyroid Nodules Detected on Imaging:
White Paper of [REDACTED]. [HOSPITAL] [B2]; [DATE].

## 2019-06-05 MED ORDER — SODIUM CHLORIDE (PF) 0.9 % IJ SOLN
INTRAMUSCULAR | Status: AC
  Filled 2019-06-05: qty 50

## 2019-06-05 MED ORDER — IOHEXOL 300 MG/ML  SOLN
100.0000 mL | Freq: Once | INTRAMUSCULAR | Status: AC | PRN
  Administered 2019-06-05: 80 mL via INTRAVENOUS

## 2019-06-05 NOTE — Telephone Encounter (Signed)
See result note.  

## 2019-06-08 ENCOUNTER — Telehealth: Payer: Self-pay | Admitting: Internal Medicine

## 2019-06-08 NOTE — Telephone Encounter (Signed)
The pt states she was pre-medicated with prednisone and benadryl for CT scan  but has developed a rash that is very itchy.  She states she has been taking benadryl around the clock with no relief since the CT scan on 2/5.  The pt has no problems with breathing.  She was advised to call her PCP or go to urgent care.  FYI Dr Henrene Pastor

## 2019-06-08 NOTE — Telephone Encounter (Signed)
Pt states that she had a reaction from the dye that was used for her imaging test last Friday. She states that rash appeared the night after her test and the next morning she woke up in hives all over her body, she was given prednisone before test but it did not help. She would like something asap.

## 2019-06-09 NOTE — Telephone Encounter (Signed)
Agree with seeing PCP or urgent care at this point

## 2019-06-11 ENCOUNTER — Ambulatory Visit (HOSPITAL_COMMUNITY)
Admission: EM | Admit: 2019-06-11 | Discharge: 2019-06-11 | Disposition: A | Discharge status: Home / Self Care | Payer: Medicare HMO | Attending: Family Medicine | Admitting: Family Medicine

## 2019-06-11 ENCOUNTER — Other Ambulatory Visit: Payer: Self-pay

## 2019-06-11 ENCOUNTER — Encounter (HOSPITAL_COMMUNITY): Payer: Self-pay

## 2019-06-11 DIAGNOSIS — R600 Localized edema: Secondary | ICD-10-CM

## 2019-06-11 DIAGNOSIS — L03116 Cellulitis of left lower limb: Secondary | ICD-10-CM | POA: Diagnosis not present

## 2019-06-11 MED ORDER — SILVER SULFADIAZINE 1 % EX CREA: 1.0000 "application " | TOPICAL_CREAM | Freq: Every day | CUTANEOUS | 0 refills | Status: DC

## 2019-06-11 MED ORDER — CEPHALEXIN 500 MG PO CAPS: 500.0000 mg | ORAL_CAPSULE | Freq: Four times a day (QID) | ORAL | 0 refills | Status: DC

## 2019-06-11 NOTE — ED Provider Notes (Signed)
[facility]    CSN: [id number] Arrival date & time: 06/11/19  1000      History   Chief Complaint Chief Complaint  Patient presents with  . Rash    HPI Tonya Dougherty is a 69 y.o. female.   HPI  Patient has chronic edema in her left ankle ever since she had a DVT in this leg years ago. She states that recently she developed a rash on her lower leg.  It itched.  She then developed low blisters all over the inside and front of her lower leg.  The skin is now red and swollen.  Is starting to feel "sore".  No fever chills.  No nausea vomiting.  She has not had problems with skin infections in the psych in the past.   Past Medical History:  Diagnosis Date  . Allergic rhinitis   . Anemia   . Anxiety   . Arthritis   . Bell's palsy   . Cataract   . Depression   . Diabetes mellitus without complication ([place])   . DVT (deep venous thrombosis) ([place])   . GERD (gastroesophageal reflux disease)   . GI bleed   . Hepatitis B   . Hypertension   . Neuropathy   . Psoriasis     Patient Active Problem List   Diagnosis Date Noted  . DVT (deep venous thrombosis) ([place]) 10/22/2017    Past Surgical History:  Procedure Laterality Date  . ANKLE SURGERY Right   . CATARACT EXTRACTION, BILATERAL      OB History   No obstetric history on file.      Home Medications    Prior to Admission medications   Medication Sig Start Date End Date Taking? Authorizing Provider  acetaminophen (TYLENOL) 500 MG tablet Take 1,000 mg by mouth every 6 (six) hours as needed for mild pain.     [provider]  albuterol (VENTOLIN HFA) 108 (90 Base) MCG/ACT inhaler Inhale 2 puffs into the lungs every 6 (six) hours as needed. 07/30/18 07/30/19  [provider]  B-D ULTRAFINE III SHORT PEN 31G X 8 MM MISC USE AS DIRECTED TO INJECT LANTUS DAILY 10/24/17   [provider]  carvedilol (COREG) 3.125 MG tablet Take 3.125 mg by mouth 2 (two) times daily with a meal.    [provider]  cephALEXin (KEFLEX) 500 MG capsule Take 1 capsule (500 mg total) by mouth 4 (four) times daily. 06/11/19   Raylene Everts, MD  Cholecalciferol (VITAMIN D3) 25 MCG (1000 UT) CAPS Take 1 capsule by mouth daily.    [provider]  citalopram (CELEXA) 10 MG tablet Take 30 mg by mouth at bedtime.     [provider]  diclofenac Sodium (VOLTAREN) 1 % GEL Apply topically. 12/23/18 12/23/19  [provider]  esomeprazole (NEXIUM) 20 MG capsule Take by mouth. 03/18/18 03/18/19  [provider]  ferrous sulfate 325 (65 FE) MG tablet Take 325 mg by mouth daily with breakfast.    [provider]  fluticasone (FLONASE) 50 MCG/ACT nasal spray as needed. 10/01/18   [provider]  furosemide (LASIX) 20 MG tablet Take 20 mg by mouth 2 (two) times daily.    [provider]  gabapentin (NEURONTIN) 100 MG capsule Take 100 mg by mouth 3 (three) times daily.     [provider]  Insulin Glargine (BASAGLAR KWIKPEN) 100 UNIT/ML SOPN Inject 40 Units into the skin at bedtime. 10/11/17   [provider]  lidocaine (LIDODERM) 5 % Apply to affected area for 12 hours only each day (then remove patch) 05/11/19   [provider]  Magnesium Oxide (MAG-CAPS PO) Take 100 mg by mouth daily.    [provider]  Melatonin 5 MG CAPS Take 1 tablet by mouth daily. 04/29/18   [provider]  polyethylene glycol powder (GLYCOLAX/MIRALAX) 17 GM/SCOOP powder Take 17 g by mouth daily.    [provider]  silver sulfADIAZINE (SILVADENE) 1 % cream Apply 1 application topically daily. 06/11/19   Raylene Everts, MD  spironolactone (ALDACTONE) 50 MG tablet Take 1 tablet by mouth daily. 09/03/18   [provider]  diphenhydrAMINE (BENADRYL) 50 MG tablet Take 1 tablet by mouth at 1pm prior to ct scan 06/01/19 06/11/19  Irene Shipper, MD  omeprazole (PRILOSEC) 20 MG capsule Take 20-40 mg by mouth daily.   06/11/19   [provider]    Family History Family History  Problem Relation Age of Onset  . Kidney failure Mother   . Diabetes Mother   . Obesity Mother   . Hypertension Mother   . Cirrhosis Father   . Hypertension Sister   . Kidney failure Brother   . Hypertension Brother   . Hypertension Sister   . Alcoholism Brother   . Other Brother        MRSA  . Cirrhosis Daughter   . Drug abuse Daughter     Social History Social History   Tobacco Use  . Smoking status: Former Smoker    Packs/day: 0.50    Types: Cigarettes    Quit date: 10/2017    Years since quitting: 1.6  . Smokeless tobacco: Never Used  Substance Use Topics  . Alcohol use: No  . Drug use: Not Currently     Allergies   Xarelto [rivaroxaban], Erythromycin, Lipitor [atorvastatin], Pravachol [pravastatin], and Ivp dye [iodinated diagnostic agents]   Review of Systems Review of Systems  Constitutional: Negative for chills and fever.  HENT: Negative for congestion.   Respiratory: Negative for cough and shortness of breath.   Cardiovascular: Positive for leg swelling.  Skin: Positive for color change and rash.     Physical Exam Triage Vital Signs ED Triage Vitals  Enc Vitals Group     BP 06/11/19 1017 124/65     Pulse Rate 06/11/19 1017 87     Resp 06/11/19 1017 18     Temp 06/11/19 1017 98.3 F (36.8 C)     Temp Source 06/11/19 1017 Oral     SpO2 06/11/19 1017 98 %     Weight 06/11/19 1015 240 lb (108.9 kg)     Height --      Head Circumference --      Peak Flow --      Pain Score 06/11/19 1016 0     Pain Loc --      Pain Edu? --      Excl. in [place]? --    No data found.  Updated Vital Signs BP 124/65 (BP Location: Right Arm)   Pulse 87   Temp 98.3 F (36.8 C) (Oral)   Resp 18   Wt 108.9 kg   SpO2 98%   BMI 43.90 kg/m      Physical Exam Constitutional:      General: She is not in acute distress.    Appearance: She is well-developed.     Comments: Pleasant.  Overweight  HENT:       Head: Normocephalic  and atraumatic.     Mouth/Throat:     Comments: Mask in place Eyes:     Conjunctiva/sclera: Conjunctivae normal.     Pupils: Pupils are equal, round, and reactive to light.  Cardiovascular:     Rate and Rhythm: Normal rate and regular rhythm.  Pulmonary:     Effort: Pulmonary effort is normal. No respiratory distress.     Breath sounds: No rales.  Musculoskeletal:        General: Normal range of motion.     Cervical back: Normal range of motion.     Right lower leg: No edema.     Left lower leg: Edema present.       Legs:  Skin:    General: Skin is warm and dry.     Comments: Edema on left lower leg only.  From the knee down.  The middle and lower portion of the leg some blistering.  Many are open and weeping serous fluid.  The surrounding skin is erythematous and mildly tender.  Neurological:     Mental Status: She is alert.  Psychiatric:        Mood and Affect: Mood normal.        Behavior: Behavior normal.      UC Treatments / Results  Labs (all labs ordered are listed, but only abnormal results are displayed) Labs Reviewed - No data to display  EKG   Radiology No results found.  Procedures Procedures (including critical care time)  Medications Ordered in UC Medications - No data to display  Initial Impression / Assessment and Plan / UC Course  I have reviewed the triage vital signs and the nursing notes.  Pertinent labs & imaging results that were available during my care of the patient were reviewed by me and considered in my medical decision making (see chart for details).     We will treat the edema with elevation and increase Lasix.  Daily dressing changes are recommended.  We will give 5 days of antibiotics to reduce risk of infection.  Follow-up with PCP as recommended Final Clinical Impressions(s) / UC Diagnoses   Final diagnoses:  Cellulitis of leg, left  Edema of left lower leg     Discharge Instructions      Increase lasix for 3 days.  Take 2 in the morning and one in the evening Take the cephalexin as directed for infection ELEVATE LEG Wash daily and apply cream.  Wrap with gauze.  Do this until the drainage stops Follow up with your PCP   ED Prescriptions    Medication Sig Dispense Auth. Provider   cephALEXin (KEFLEX) 500 MG capsule Take 1 capsule (500 mg total) by mouth 4 (four) times daily. 28 capsule Raylene Everts, MD   silver sulfADIAZINE (SILVADENE) 1 % cream Apply 1 application topically daily. 50 g Raylene Everts, MD     PDMP not reviewed this encounter.   Raylene Everts, MD 06/11/19 1332

## 2019-06-11 NOTE — Discharge Instructions (Signed)
Increase lasix for 3 days.  Take 2 in the morning and one in the evening Take the cephalexin as directed for infection ELEVATE LEG Wash daily and apply cream.  Wrap with gauze.  Do this until the drainage stops Follow up with your PCP

## 2019-06-11 NOTE — ED Triage Notes (Signed)
Pt state she has rash on her left lower leg. Pt state sit started looking like little water blisters  Pt states it looks infected.

## 2019-07-01 ENCOUNTER — Other Ambulatory Visit: Payer: Self-pay

## 2019-07-23 ENCOUNTER — Ambulatory Visit: Payer: Self-pay | Admitting: Surgery

## 2019-07-23 NOTE — H&P (Signed)
CC: Referred by Dr. Fuller Plan for newly diagnosed distal sigmoid colon cancer  HPI: Tonya Dougherty is a very pleasant 12yoF with hx of HTN, HLD, DM, ?hx DVT, gastritis, whom had a recent history of workup for fatigue and demonstrated iron deficiency anemia. Further workup including colonoscopy and EGD 05/29/2019 done by Dr. Fuller Plan demonstrated:  1. Ulcerated mass in the distal sigmoid, 25 cm from anal verge, circumferential and friable. Biopsied. Tattooed just distal to the mass. 2. 7 polyps between 3 and 10 mm were removed from the remaining colon. 3. Multiple diverticula and sigmoid. 4. Appendiceal orifice somewhat prominent.  Biopsy of the mass in the sigmoid returned tubulovillous adenoma with high-grade dysplasia and some deeper glands of an hyperplastic muscular mucosa. , As were all cemented path report that there were suspicious findings on this biopsy specimen for invasive adenocarcinoma as well. EGD is normal.  CEA 5 Hemoglobin 10.1 Total bilirubin 0.7  Staging CT chest/abdomen/pelvis 06/05/19 showed a circumferential mass in the mid to distal sigmoid. No evidence of lymphadenopathy or metastatic disease in the chest/abdomen/pelvis. Normal-appearing appendix. Coarse nodular contour of the liver suggestive of cirrhosis.  She is here today for surgical follow-up. She has been doing well. She denies any complaints today. She denies any history of abdominal pain, nausea/vomiting, blood visible in her stool.  PMH: HTN (well-controlled on oral antihypertensive); DM (well-controlled on insulin); GERD (well-controlled with PPI)  PSH: Right foot surgery following fracture but denies any other surgical history  FHx: Denies FHx of malignancy  Social: Denies use of tobacco/EtOH/drugs. She is retired but previously worked in Doctor, hospital.  ROS: A comprehensive 10 system review of systems was completed with the patient and pertinent findings as noted above.  The patient is a  69 year old female.   Past Surgical History Tonya Dougherty, Tonya Dougherty; 06/10/2019 2:02 PM) Colon Polyp Removal - Colonoscopy  Colon Polyp Removal - Open  Foot Surgery  Right.  Diagnostic Studies History Tonya Dougherty, Tonya Dougherty; 06/10/2019 2:02 PM) Colonoscopy  within last year Mammogram  1-3 years ago Pap Smear  1-5 years ago  Allergies Tonya Dougherty, Tonya Dougherty; 06/10/2019 2:19 PM) Contrast [employer] *MEDICAL DEVICES AND SUPPLIES*  Hives. Xarelto *ANTICOAGULANTS*  Erythromycin *DERMATOLOGICALS*  Lipitor *ANTIHYPERLIPIDEMICS*  Pravachol *ANTIHYPERLIPIDEMICS*  Allergies Reconciled   Medication History [employer], Tonya Dougherty; 06/10/2019 2:07 PM) Acetaminophen (500MG  Tablet, Oral) Active. Albuterol Sulfate HFA (108 (90 Base)MCG/ACT Aerosol Soln, Inhalation) Active. BD Pen Needle Short U/F (31G X 8 MM Misc,) Active. Carvedilol (3.125MG  Tablet, Oral) Active. Cholecalciferol (25 MCG(1000 UT) Capsule, Oral) Active. Citalopram Hydrobromide (10MG  Tablet, Oral) Active. Diclofenac Sodium (1% Gel, Transdermal) Active. Benadryl (25MG  Tablet, Oral) Active. Esomeprazole Magnesium (20MG  Capsule DR, Oral) Active. Ferrous Sulfate (325 (65 Fe)MG Tablet, Oral) Active. Fluticasone Propionate (50MCG/ACT Suspension, Nasal) Active. Furosemide (20MG  Tablet, Oral) Active. Gabapentin (300MG  Capsule, Oral) Active. Basaglar KwikPen (100UNIT/ML Soln Pen-inj, Subcutaneous) Active. Lidocaine (5% Patch, External) Active. Magnesium Oxide (Oral) Specific strength unknown - Active. Melatonin (5MG  Tablet Disint, Oral) Active. Omeprazole (20MG  Capsule DR, Oral) Active. MiraLax (17GM/SCOOP Powder, Oral) Active. Spironolactone (50MG  Tablet, Oral) Active. Medications Reconciled  Social History Tonya Bers [place], Tonya Dougherty; 06/10/2019 2:02 PM) Alcohol use  Remotely quit alcohol use. Caffeine use  Carbonated beverages, Coffee, Tea. No drug use  Tobacco use  Former  smoker.  Family History Tonya Dougherty, Tonya Dougherty; 06/10/2019 2:02 PM) Alcohol Abuse  Father. Depression  Daughter, Mother. Hypertension  Mother. Kidney Disease  Mother. Respiratory Condition  Daughter, Father. Thyroid problems  Sister.  Pregnancy / Birth History (  Tonya Dougherty, Tonya Dougherty; 06/10/2019 2:02 PM) Age at menarche  85 years. Age of menopause  75-60 Gravida  2 Maternal age  70-20 Para  2  Other Problems Tonya Bers [place], Tonya Dougherty; 06/10/2019 2:02 PM) Anxiety Disorder  Arthritis  Back Pain  Colon Cancer  Depression  Diabetes Mellitus  Gastroesophageal Reflux Disease  Heart murmur  Hemorrhoids  High blood pressure  Transfusion history     Review of Systems Tonya Bers [place] Tonya Dougherty; 06/10/2019 2:02 PM) General Not Present- Appetite Loss, Chills, Fatigue, Fever, Night Sweats, Weight Gain and Weight Loss. Skin Present- Dryness and Hives. Not Present- Change in Wart/Mole, Jaundice, New Lesions, Non-Healing Wounds, Rash and Ulcer. HEENT Present- Wears glasses/contact lenses. Not Present- Earache, Hearing Loss, Hoarseness, Nose Bleed, Oral Ulcers, Ringing in the Ears, Seasonal Allergies, Sinus Pain, Sore Throat, Visual Disturbances and Yellow Eyes. Respiratory Not Present- Bloody sputum, Chronic Cough, Difficulty Breathing, Snoring and Wheezing. Breast Not Present- Breast Mass, Breast Pain, Nipple Discharge and Skin Changes. Cardiovascular Present- Swelling of Extremities. Not Present- Chest Pain, Difficulty Breathing Lying Down, Leg Cramps, Palpitations, Rapid Heart Rate and Shortness of Breath. Gastrointestinal Present- Hemorrhoids. Not Present- Abdominal Pain, Bloating, Bloody Stool, Change in Bowel Habits, Chronic diarrhea, Constipation, Difficulty Swallowing, Excessive gas, Gets full quickly at meals, Indigestion, Nausea, Rectal Pain and Vomiting. Musculoskeletal Present- Back Pain. Not Present- Joint Pain, Joint Stiffness, Muscle Pain, Muscle Weakness and  Swelling of Extremities. Neurological Not Present- Decreased Memory, Fainting, Headaches, Numbness, Seizures, Tingling, Tremor, Trouble walking and Weakness. Psychiatric Present- Anxiety, Depression and Fearful. Not Present- Bipolar, Change in Sleep Pattern and Frequent crying. Endocrine Present- New Diabetes. Not Present- Cold Intolerance, Excessive Hunger, Hair Changes, Heat Intolerance and Hot flashes. Hematology Present- Blood Thinners. Not Present- Easy Bruising, Excessive bleeding, Gland problems, HIV and Persistent Infections.  Vitals Tonya Bers [place] Tonya Dougherty; 06/10/2019 2:07 PM) 06/10/2019 2:07 PM Weight: 240.8 lb Height: 62in Body Surface Area: 2.07 m Body Mass Index: 44.04 kg/m  Temp.: 98.59F(Temporal)  Pulse: 68 (Regular)  P.OX: 94% (Room air) BP: 152/68 (Sitting, Left Arm, Standard)       Physical Exam Tonya Dougherty; 06/10/2019 2:35 PM) The physical exam findings are as follows: Note:Constitutional: No acute distress; conversant; no deformities; wearing mask Eyes: Moist conjunctiva; no lid lag; anicteric sclerae; pupils equal and round Neck: Trachea midline; no palpable thyromegaly Lungs: Normal respiratory effort; no tactile fremitus CV: rrr; no palpable thrill; no pitting edema GI: Abdomen obese, soft, nontender, nondistended; no palpable hepatosplenomegaly MSK: Normal gait; no clubbing/cyanosis Psychiatric: Appropriate affect; alert and oriented 3 Lymphatic: No palpable cervical or axillary lymphadenopathy    Assessment & Plan Tonya Dougherty; 06/10/2019 2:39 PM) COLON CANCER (C18.9) Story: Ms. Zaya is a very pleasant 60yoF with hx of HTN, HLD, DM, GERD, ?dvt here today for newly diagnosed mid/distal sigmoid colon cancer - previous history of possible blood clot and was on Eliquis was but has been off since December as per her primary doctor  -Staging CT scans demonstrate no evidence of metastatic disease CEA 5.0 Impression:  -The anatomy and physiology of the GI tract was discussed at length with the patient. The pathophysiology of colon polyps and cancer was discussed at length with associated pictures. -We discussed options before including surgery which offers the highest probability/chance for treatment and care. We discussed robotic sigmoidectomy, possible low anterior resection, flexible sigmoidoscopy. -The planned procedure, material risks (including, but not limited to, pain, bleeding, infection, scarring, need for blood transfusion, damage to surrounding structures- blood vessels/nerves/viscus/organs, damage to ureter,  urine leak, leak from anastomosis, need for additional procedures, worsening of pre-existing medical conditions, need for stoma which may be permanent, hernia, recurrence of cancer despite surgery, DVT/PE, pneumonia, heart attack, stroke, death) benefits and alternatives to surgery were discussed at length. The patient's questions were answered to her satisfaction, she voiced understanding and elected to proceed with surgery pending clearance by her PCP. Additionally, we discussed typical postoperative expectations and the recovery process. -We are requesting preoperative clearance from her PCP in [facility], Dr. Maceo Pro  This patient encounter took 65 minutes today to perform the following: take history, perform exam, review outside records, interpret imaging, counsel the patient on their diagnosis and document encounter, findings & plan in the EHR  Signed by Ileana Roup, Dougherty (06/10/2019 2:40 PM)

## 2019-07-30 NOTE — Telephone Encounter (Signed)
error 

## 2019-08-05 ENCOUNTER — Encounter (HOSPITAL_COMMUNITY): Payer: Self-pay

## 2019-08-05 NOTE — Patient Instructions (Addendum)
DUE TO COVID-19 ONLY TWO VISITORS ARE ALLOWED TO COME WITH YOU AND STAY IN THE WAITING ROOM ONLY DURING PRE OP AND PROCEDURE. THE TWO VISITORS MAY VISIT WITH YOU IN YOUR PRIVATE ROOM DURING VISITING HOURS ONLY!!   COVID SWAB TESTING MUST BE COMPLETED ON:  Tuesday, August 11, 2019 at 2:45 PM  [address] [place] -[facility] enter pre surgical testing line (Must self quarantine after testing. Follow instructions on handout.)             Your procedure is scheduled on: Friday, August 14, 2019   Report to [facility] Main  Entrance    Report to admitting at 10:55AM   Call this number if you have problems the morning of surgery [phone]   Dulocolax: Take the day before surgery   Miralax:  Mix with 64 oz Gatorade/Powerade. Drink gradually over the next few hours (8 oz glass every 15-30 minutes) until gone the day prior to surgery.    Neomycin and Metronidazole:  At 2 pm, 3 pm and 10 pm after Miralax bowel prep the day prior to surgery.     Drink 2 G2  drinks the night before surgery.    Do not eat food:After Midnight.   May have liquids until 9:55 AM day of surgery   CLEAR LIQUID DIET  Foods Allowed                                                                     Foods Excluded  Water, Black Coffee and tea, regular and decaf                             liquids that you cannot  Plain Jell-O in any flavor  (No red)                                           see through such as: Fruit ices (not with fruit pulp)                                     milk, soups, orange juice  Iced Popsicles (No red)                                    All solid food Carbonated beverages, regular and diet                                    Apple juices Sports drinks like Gatorade (No red) Lightly seasoned clear broth or consume(fat free) Sugar, honey syrup  Sample Menu Breakfast                                Lunch  Supper Cranberry juice                    Beef broth                            Chicken broth Jell-O                                     Grape juice                           Apple juice Coffee or tea                        Jell-O                                      Popsicle                                                Coffee or tea                        Coffee or tea   Complete one G2 drink the morning of surgery at 9:55AM the day of surgery.  Oral Hygiene is also important to reduce your risk of infection.                                    Remember - BRUSH YOUR TEETH THE MORNING OF SURGERY WITH YOUR REGULAR TOOTHPASTE   Do NOT smoke after Midnight   Take these medicines the morning of surgery with A SIP OF WATER: Carvedilol, Esomeprazole   May use Flonase if needed  DO NOT TAKE ANY ORAL DIABETIC MEDICATIONS DAY OF YOUR SURGERY                               You may not have any metal on your body including hair pins, jewelry, and body piercings             Do not wear make-up, lotions, powders, perfumes/cologne, or deodorant             Do not wear nail polish.  Do not shave  48 hours prior to surgery.              Men may shave face and neck.   Do not bring valuables to the hospital. [place].   Contacts, dentures or bridgework may not be worn into surgery.   Bring small overnight bag day of surgery.    Patients discharged the day of surgery will not be allowed to drive home.   Name and phone number of your driver:   Special Instructions: Bring a copy of your healthcare power of attorney and living will documents         the day of surgery if you haven't  scanned them in before.              Please read over the following fact sheets you were given:  How to Manage Your Diabetes Before and After Surgery  Why is it important to control my blood sugar before and after surgery? . Improving blood sugar levels before and  after surgery helps healing and can limit problems. . A way of improving blood sugar control is eating a healthy diet by: o  Eating less sugar and carbohydrates o  Increasing activity/exercise o  Talking with your doctor about reaching your blood sugar goals . High blood sugars (greater than 180 mg/dL) can raise your risk of infections and slow your recovery, so you will need to focus on controlling your diabetes during the weeks before surgery. . Make sure that the doctor who takes care of your diabetes knows about your planned surgery including the date and location.  How do I manage my blood sugar before surgery? . Check your blood sugar at least 4 times a day, starting 2 days before surgery, to make sure that the level is not too high or low. o Check your blood sugar the morning of your surgery when you wake up and every 2 hours until you get to the Short Stay unit. . If your blood sugar is less than 70 mg/dL, you will need to treat for low blood sugar: o Do not take insulin. o Treat a low blood sugar (less than 70 mg/dL) with  cup of clear juice (cranberry or apple), 4 glucose tablets, OR glucose gel. o Recheck blood sugar in 15 minutes after treatment (to make sure it is greater than 70 mg/dL). If your blood sugar is not greater than 70 mg/dL on recheck, call [phone] for further instructions. . Report your blood sugar to the short stay nurse when you get to Short Stay.  . If you are admitted to the hospital after surgery: o Your blood sugar will be checked by the staff and you will probably be given insulin after surgery (instead of oral diabetes medicines) to make sure you have good blood sugar levels. o The goal for blood sugar control after surgery is 80-180 mg/dL.   WHAT DO I DO ABOUT MY DIABETES MEDICATION?  Marland Kitchen Do not take oral diabetes medicines (pills) the morning of surgery.  . THE NIGHT BEFORE SURGERY, take 20    units of  Glargine     insulin.      Reviewed and  Endorsed by [facility] Patient Education Committee, August 2015  [facility] [place] - Preparing for Surgery Before surgery, you can play an important role.  Because skin is not sterile, your skin needs to be as free of germs as possible.  You can reduce the number of germs on your skin by washing with CHG (chlorahexidine gluconate) soap before surgery.  CHG is an antiseptic cleaner which kills germs and bonds with the skin to continue killing germs even after washing. Please DO NOT use if you have an allergy to CHG or antibacterial soaps.  If your skin becomes reddened/irritated stop using the CHG and inform your nurse when you arrive at Short Stay. Do not shave (including legs and underarms) for at least 48 hours prior to the first CHG shower.  You may shave your face/neck.  Please follow these instructions carefully:  1.  Shower with CHG Soap the night before surgery and the  morning of surgery.  2.  If you choose to  wash your hair, wash your hair first as usual with your normal  shampoo.  3.  After you shampoo, rinse your hair and body thoroughly to remove the shampoo.                             4.  Use CHG as you would any other liquid soap.  You can apply chg directly to the skin and wash.  Gently with a scrungie or clean washcloth.  5.  Apply the CHG Soap to your body ONLY FROM THE NECK DOWN.   Do   not use on face/ open                           Wound or open sores. Avoid contact with eyes, ears mouth and   genitals (private parts).                       Wash face,  Genitals (private parts) with your normal soap.             6.  Wash thoroughly, paying special attention to the area where your    surgery  will be performed.  7.  Thoroughly rinse your body with warm water from the neck down.  8.  DO NOT shower/wash with your normal soap after using and rinsing off the CHG Soap.                9.  Pat yourself dry with a clean towel.            10.  Wear clean pajamas.            11.  Place clean  sheets on your bed the night of your first shower and do not  sleep with pets. Day of Surgery : Do not apply any lotions/deodorants the morning of surgery.  Please wear clean clothes to the hospital/surgery center.  FAILURE TO FOLLOW THESE INSTRUCTIONS MAY RESULT IN THE CANCELLATION OF YOUR SURGERY  PATIENT SIGNATURE_________________________________  NURSE SIGNATURE__________________________________  ________________________________________________________________________   Tonya Dougherty  An incentive spirometer is a tool that can help keep your lungs clear and active. This tool measures how well you are filling your lungs with each breath. Taking long deep breaths may help reverse or decrease the chance of developing breathing (pulmonary) problems (especially infection) following:  A long period of time when you are unable to move or be active. BEFORE THE PROCEDURE   If the spirometer includes an indicator to show your best effort, your nurse or respiratory therapist will set it to a desired goal.  If possible, sit up straight or lean slightly forward. Try not to slouch.  Hold the incentive spirometer in an upright position. INSTRUCTIONS FOR USE  1. Sit on the edge of your bed if possible, or sit up as far as you can in bed or on a chair. 2. Hold the incentive spirometer in an upright position. 3. Breathe out normally. 4. Place the mouthpiece in your mouth and seal your lips tightly around it. 5. Breathe in slowly and as deeply as possible, raising the piston or the ball toward the top of the column. 6. Hold your breath for 3-5 seconds or for as long as possible. Allow the piston or ball to fall to the bottom of the column. 7. Remove the mouthpiece from your mouth and breathe out normally. 8.  Rest for a few seconds and repeat Steps 1 through 7 at least 10 times every 1-2 hours when you are awake. Take your time and take a few normal breaths between deep breaths. 9. The  spirometer may include an indicator to show your best effort. Use the indicator as a goal to work toward during each repetition. 10. After each set of 10 deep breaths, practice coughing to be sure your lungs are clear. If you have an incision (the cut made at the time of surgery), support your incision when coughing by placing a pillow or rolled up towels firmly against it. Once you are able to get out of bed, walk around indoors and cough well. You may stop using the incentive spirometer when instructed by your caregiver.  RISKS AND COMPLICATIONS  Take your time so you do not get dizzy or light-headed.  If you are in pain, you may need to take or ask for pain medication before doing incentive spirometry. It is harder to take a deep breath if you are having pain. AFTER USE  Rest and breathe slowly and easily.  It can be helpful to keep track of a log of your progress. Your caregiver can provide you with a simple table to help with this. If you are using the spirometer at home, follow these instructions: [place] IF:   You are having difficultly using the spirometer.  You have trouble using the spirometer as often as instructed.  Your pain medication is not giving enough relief while using the spirometer.  You develop fever of 100.5 F (38.1 C) or higher. SEEK IMMEDIATE MEDICAL CARE IF:   You cough up bloody sputum that had not been present before.  You develop fever of 102 F (38.9 C) or greater.  You develop worsening pain at or near the incision site. MAKE SURE YOU:   Understand these instructions.  Will watch your condition.  Will get help right away if you are not doing well or get worse. Document Released: 08/27/2006 Document Revised: 07/09/2011 Document Reviewed: 10/28/2006 Exit[employer] Patient Information 2014 [employer], [place].   ________________________________________________________________________  WHAT IS A BLOOD TRANSFUSION? Blood Transfusion  Information  A transfusion is the replacement of blood or some of its parts. Blood is made up of multiple cells which provide different functions.  Red blood cells carry oxygen and are used for blood loss replacement.  White blood cells fight against infection.  Platelets control bleeding.  Plasma helps clot blood.  Other blood products are available for specialized needs, such as hemophilia or other clotting disorders. BEFORE THE TRANSFUSION  Who gives blood for transfusions?   Healthy volunteers who are fully evaluated to make sure their blood is safe. This is blood bank blood. Transfusion therapy is the safest it has ever been in the practice of medicine. Before blood is taken from a donor, a complete history is taken to make sure that person has no history of diseases nor engages in risky social behavior (examples are intravenous drug use or sexual activity with multiple partners). The donor's travel history is screened to minimize risk of transmitting infections, such as malaria. The donated blood is tested for signs of infectious diseases, such as HIV and hepatitis. The blood is then tested to be sure it is compatible with you in order to minimize the chance of a transfusion reaction. If you or a relative donates blood, this is often done in anticipation of surgery and is not appropriate for emergency situations. It takes  many days to process the donated blood. RISKS AND COMPLICATIONS Although transfusion therapy is very safe and saves many lives, the main dangers of transfusion include:   Getting an infectious disease.  Developing a transfusion reaction. This is an allergic reaction to something in the blood you were given. Every precaution is taken to prevent this. The decision to have a blood transfusion has been considered carefully by your caregiver before blood is given. Blood is not given unless the benefits outweigh the risks. AFTER THE TRANSFUSION  Right after receiving a  blood transfusion, you will usually feel much better and more energetic. This is especially true if your red blood cells have gotten low (anemic). The transfusion raises the level of the red blood cells which carry oxygen, and this usually causes an energy increase.  The nurse administering the transfusion will monitor you carefully for complications. HOME CARE INSTRUCTIONS  No special instructions are needed after a transfusion. You may find your energy is better. Speak with your caregiver about any limitations on activity for underlying diseases you may have. SEEK MEDICAL CARE IF:   Your condition is not improving after your transfusion.  You develop redness or irritation at the intravenous (IV) site. SEEK IMMEDIATE MEDICAL CARE IF:  Any of the following symptoms occur over the next 12 hours:  Shaking chills.  You have a temperature by mouth above 102 F (38.9 C), not controlled by medicine.  Chest, back, or muscle pain.  People around you feel you are not acting correctly or are confused.  Shortness of breath or difficulty breathing.  Dizziness and fainting.  You get a rash or develop hives.  You have a decrease in urine output.  Your urine turns a dark color or changes to pink, red, or brown. Any of the following symptoms occur over the next 10 days:  You have a temperature by mouth above 102 F (38.9 C), not controlled by medicine.  Shortness of breath.  Weakness after normal activity.  The white part of the eye turns yellow (jaundice).  You have a decrease in the amount of urine or are urinating less often.  Your urine turns a dark color or changes to pink, red, or brown. Document Released: 04/13/2000 Document Revised: 07/09/2011 Document Reviewed: 12/01/2007 [facility] Patient Information 2014 [place], [place].  _______________________________________________________________________

## 2019-08-06 ENCOUNTER — Other Ambulatory Visit: Payer: Self-pay

## 2019-08-06 ENCOUNTER — Encounter (HOSPITAL_COMMUNITY)
Admission: RE | Admit: 2019-08-06 | Discharge: 2019-08-06 | Disposition: A | Discharge status: Home / Self Care | Payer: Medicare HMO | Source: Ambulatory Visit | Attending: Surgery | Admitting: Surgery

## 2019-08-06 ENCOUNTER — Encounter (HOSPITAL_COMMUNITY): Payer: Self-pay

## 2019-08-06 DIAGNOSIS — Z01812 Encounter for preprocedural laboratory examination: Secondary | ICD-10-CM | POA: Diagnosis not present

## 2019-08-06 DIAGNOSIS — K219 Gastro-esophageal reflux disease without esophagitis: Secondary | ICD-10-CM | POA: Diagnosis not present

## 2019-08-06 DIAGNOSIS — Z79899 Other long term (current) drug therapy: Secondary | ICD-10-CM | POA: Diagnosis not present

## 2019-08-06 DIAGNOSIS — I10 Essential (primary) hypertension: Secondary | ICD-10-CM | POA: Diagnosis not present

## 2019-08-06 DIAGNOSIS — E119 Type 2 diabetes mellitus without complications: Secondary | ICD-10-CM | POA: Insufficient documentation

## 2019-08-06 DIAGNOSIS — C189 Malignant neoplasm of colon, unspecified: Secondary | ICD-10-CM | POA: Diagnosis not present

## 2019-08-06 HISTORY — DX: Other specified forms of tremor: G25.2

## 2019-08-06 HISTORY — DX: Malignant neoplasm of colon, unspecified: C18.9

## 2019-08-06 HISTORY — DX: Disorder of kidney and ureter, unspecified: N28.9

## 2019-08-06 HISTORY — DX: Unspecified cirrhosis of liver: K74.60

## 2019-08-06 HISTORY — DX: Major depressive disorder, single episode, unspecified: F32.9

## 2019-08-06 HISTORY — DX: Personal history of other mental and behavioral disorders: Z86.59

## 2019-08-06 HISTORY — DX: Diverticulosis of intestine, part unspecified, without perforation or abscess without bleeding: K57.90

## 2019-08-06 HISTORY — DX: Portal vein thrombosis: I81

## 2019-08-06 HISTORY — DX: Iron deficiency anemia, unspecified: D50.9

## 2019-08-06 HISTORY — DX: Morbid (severe) obesity due to excess calories: E66.01

## 2019-08-06 HISTORY — DX: Insomnia, unspecified: G47.00

## 2019-08-06 LAB — CBC WITH DIFFERENTIAL/PLATELET
Abs Immature Granulocytes: 0.01 10*3/uL (ref 0.00–0.07)
Basophils Absolute: 0 10*3/uL (ref 0.0–0.1)
Basophils Relative: 1 %
Eosinophils Absolute: 0.2 10*3/uL (ref 0.0–0.5)
Eosinophils Relative: 3 %
HCT: 33.7 % — ABNORMAL LOW (ref 36.0–46.0)
Hemoglobin: 9.6 g/dL — ABNORMAL LOW (ref 12.0–15.0)
Immature Granulocytes: 0 %
Lymphocytes Relative: 20 %
Lymphs Abs: 1 10*3/uL (ref 0.7–4.0)
MCH: 24.2 pg — ABNORMAL LOW (ref 26.0–34.0)
MCHC: 28.5 g/dL — ABNORMAL LOW (ref 30.0–36.0)
MCV: 85.1 fL (ref 80.0–100.0)
Monocytes Absolute: 0.4 10*3/uL (ref 0.1–1.0)
Monocytes Relative: 8 %
Neutro Abs: 3.5 10*3/uL (ref 1.7–7.7)
Neutrophils Relative %: 68 %
Platelets: 146 10*3/uL — ABNORMAL LOW (ref 150–400)
RBC: 3.96 MIL/uL (ref 3.87–5.11)
RDW: 18.7 % — ABNORMAL HIGH (ref 11.5–15.5)
WBC: 5.2 10*3/uL (ref 4.0–10.5)
nRBC: 0 % (ref 0.0–0.2)

## 2019-08-06 LAB — PROTIME-INR
INR: 1.1 (ref 0.8–1.2)
Prothrombin Time: 14.2 seconds (ref 11.4–15.2)

## 2019-08-06 LAB — COMPREHENSIVE METABOLIC PANEL
ALT: 12 U/L (ref 0–44)
AST: 19 U/L (ref 15–41)
Albumin: 3 g/dL — ABNORMAL LOW (ref 3.5–5.0)
Alkaline Phosphatase: 137 U/L — ABNORMAL HIGH (ref 38–126)
Anion gap: 6 (ref 5–15)
BUN: 21 mg/dL (ref 8–23)
CO2: 28 mmol/L (ref 22–32)
Calcium: 8.7 mg/dL — ABNORMAL LOW (ref 8.9–10.3)
Chloride: 104 mmol/L (ref 98–111)
Creatinine, Ser: 1.06 mg/dL — ABNORMAL HIGH (ref 0.44–1.00)
GFR calc Af Amer: >60 mL/min (ref >60)
GFR calc non Af Amer: 54 mL/min — ABNORMAL LOW (ref >60)
Glucose, Bld: 108 mg/dL — ABNORMAL HIGH (ref 70–99)
Potassium: 4.1 mmol/L (ref 3.5–5.1)
Sodium: 138 mmol/L (ref 135–145)
Total Bilirubin: 0.9 mg/dL (ref 0.3–1.2)
Total Protein: 6.4 g/dL — ABNORMAL LOW (ref 6.5–8.1)

## 2019-08-06 LAB — ABO/RH: ABO/RH(D): A POS

## 2019-08-06 LAB — APTT: aPTT: 29 seconds (ref 24–36)

## 2019-08-06 LAB — GLUCOSE, CAPILLARY: Glucose-Capillary: 112 mg/dL — ABNORMAL HIGH (ref 70–99)

## 2019-08-06 NOTE — Progress Notes (Signed)
PCP - Dr. Merlene Morse Cardiologist - N/A  Chest x-ray - N/A EKG - 06/26/19 in chart Stress Test - N/A ECHO - 06/29/19 in care everywhere Cardiac Cath -N/A   Sleep Study - N/A CPAP - N/A  Fasting Blood Sugar - 130's Checks Blood Sugar __2___ times a week  Blood Thinner Instructions: N/A Aspirin Instructions: N/A Last Dose: N/A  Anesthesia review: Cirrhosis  Patient denies shortness of breath, fever, cough and chest pain at PAT appointment   Patient verbalized understanding of instructions that were given to them at the PAT appointment. Patient was also instructed that they will need to review over the PAT instructions again at home before surgery.

## 2019-08-07 LAB — HEMOGLOBIN A1C
Hgb A1c MFr Bld: 7.1 % — ABNORMAL HIGH (ref 4.8–5.6)
Mean Plasma Glucose: 157 mg/dL

## 2019-08-07 NOTE — Progress Notes (Signed)
CBCB/Diff results faxed to Dr. Orest Dikes office and also left a voicemail at Dr. Orest Dikes office.

## 2019-08-11 ENCOUNTER — Other Ambulatory Visit (HOSPITAL_COMMUNITY)
Admission: RE | Admit: 2019-08-11 | Discharge: 2019-08-11 | Disposition: A | Discharge status: Home / Self Care | Payer: Medicare HMO | Source: Ambulatory Visit | Attending: Surgery | Admitting: Surgery

## 2019-08-11 DIAGNOSIS — Z20822 Contact with and (suspected) exposure to covid-19: Secondary | ICD-10-CM | POA: Insufficient documentation

## 2019-08-11 DIAGNOSIS — Z01812 Encounter for preprocedural laboratory examination: Secondary | ICD-10-CM | POA: Insufficient documentation

## 2019-08-11 LAB — SARS CORONAVIRUS 2 (TAT 6-24 HRS): SARS Coronavirus 2: NEGATIVE

## 2019-08-13 MED ORDER — BUPIVACAINE LIPOSOME 1.3 % IJ SUSP
20.0000 mL | INTRAMUSCULAR | Status: DC
  Filled 2019-08-13: qty 20

## 2019-08-14 ENCOUNTER — Inpatient Hospital Stay (HOSPITAL_COMMUNITY): Payer: Medicare HMO | Admitting: Physician Assistant

## 2019-08-14 ENCOUNTER — Encounter (HOSPITAL_COMMUNITY): Payer: Self-pay | Admitting: Surgery

## 2019-08-14 ENCOUNTER — Inpatient Hospital Stay (HOSPITAL_COMMUNITY): Payer: Medicare HMO | Admitting: Anesthesiology

## 2019-08-14 ENCOUNTER — Inpatient Hospital Stay (HOSPITAL_COMMUNITY)
Admission: RE | Admit: 2019-08-14 | Discharge: 2019-08-16 | DRG: 331 | Disposition: A | Discharge status: Home / Self Care | Payer: Medicare HMO | Attending: Surgery | Admitting: Surgery

## 2019-08-14 ENCOUNTER — Encounter (HOSPITAL_COMMUNITY)
Admission: RE | Disposition: A | Discharge status: Home / Self Care | Payer: Self-pay | Source: Other Acute Inpatient Hospital | Attending: Surgery

## 2019-08-14 DIAGNOSIS — Z8719 Personal history of other diseases of the digestive system: Secondary | ICD-10-CM

## 2019-08-14 DIAGNOSIS — Z85038 Personal history of other malignant neoplasm of large intestine: Secondary | ICD-10-CM

## 2019-08-14 DIAGNOSIS — E1136 Type 2 diabetes mellitus with diabetic cataract: Secondary | ICD-10-CM | POA: Diagnosis present

## 2019-08-14 DIAGNOSIS — Z20822 Contact with and (suspected) exposure to covid-19: Secondary | ICD-10-CM | POA: Diagnosis present

## 2019-08-14 DIAGNOSIS — K7460 Unspecified cirrhosis of liver: Secondary | ICD-10-CM | POA: Diagnosis not present

## 2019-08-14 DIAGNOSIS — C187 Malignant neoplasm of sigmoid colon: Principal | ICD-10-CM | POA: Diagnosis present

## 2019-08-14 DIAGNOSIS — Z7984 Long term (current) use of oral hypoglycemic drugs: Secondary | ICD-10-CM | POA: Diagnosis not present

## 2019-08-14 DIAGNOSIS — Z881 Allergy status to other antibiotic agents status: Secondary | ICD-10-CM | POA: Diagnosis not present

## 2019-08-14 DIAGNOSIS — Z86718 Personal history of other venous thrombosis and embolism: Secondary | ICD-10-CM | POA: Diagnosis not present

## 2019-08-14 DIAGNOSIS — F17210 Nicotine dependence, cigarettes, uncomplicated: Secondary | ICD-10-CM | POA: Diagnosis not present

## 2019-08-14 DIAGNOSIS — I10 Essential (primary) hypertension: Secondary | ICD-10-CM | POA: Diagnosis present

## 2019-08-14 DIAGNOSIS — M1990 Unspecified osteoarthritis, unspecified site: Secondary | ICD-10-CM | POA: Diagnosis not present

## 2019-08-14 HISTORY — PX: FLEXIBLE SIGMOIDOSCOPY: SHX5431

## 2019-08-14 LAB — PREPARE RBC (CROSSMATCH)

## 2019-08-14 LAB — GLUCOSE, CAPILLARY
Glucose-Capillary: 120 mg/dL — ABNORMAL HIGH (ref 70–99)
Glucose-Capillary: 106 mg/dL — ABNORMAL HIGH (ref 70–99)
Glucose-Capillary: 194 mg/dL — ABNORMAL HIGH (ref 70–99)

## 2019-08-14 SURGERY — COLECTOMY, PARTIAL, ROBOT-ASSISTED, LAPAROSCOPIC
Anesthesia: General

## 2019-08-14 MED ORDER — SUGAMMADEX SODIUM 500 MG/5ML IV SOLN
INTRAVENOUS | Status: AC
  Filled 2019-08-14: qty 5

## 2019-08-14 MED ORDER — SUGAMMADEX SODIUM 200 MG/2ML IV SOLN
INTRAVENOUS | Status: DC | PRN
  Administered 2019-08-14: 250 mg via INTRAVENOUS

## 2019-08-14 MED ORDER — ACETAMINOPHEN 10 MG/ML IV SOLN: 1000.0000 mg | Freq: Once | INTRAVENOUS | Status: DC | PRN

## 2019-08-14 MED ORDER — INSULIN ASPART 100 UNIT/ML ~~LOC~~ SOLN: 0.0000 [IU] | Freq: Every day | SUBCUTANEOUS | Status: DC

## 2019-08-14 MED ORDER — CARVEDILOL 3.125 MG PO TABS
3.1250 mg | ORAL_TABLET | Freq: Two times a day (BID) | ORAL | Status: DC
  Administered 2019-08-14 – 2019-08-16 (×4): 3.125 mg via ORAL
  Filled 2019-08-14 (×4): qty 1

## 2019-08-14 MED ORDER — FUROSEMIDE 20 MG PO TABS: 20.0000 mg | ORAL_TABLET | Freq: Every evening | ORAL | Status: DC

## 2019-08-14 MED ORDER — CHLORHEXIDINE GLUCONATE CLOTH 2 % EX PADS: 6.0000 | MEDICATED_PAD | Freq: Once | CUTANEOUS | Status: DC

## 2019-08-14 MED ORDER — BISACODYL 5 MG PO TBEC: 20.0000 mg | DELAYED_RELEASE_TABLET | Freq: Once | ORAL | Status: DC

## 2019-08-14 MED ORDER — PNEUMOCOCCAL VAC POLYVALENT 25 MCG/0.5ML IJ INJ
0.5000 mL | INJECTION | INTRAMUSCULAR | Status: DC
  Filled 2019-08-14: qty 0.5

## 2019-08-14 MED ORDER — MIDAZOLAM HCL 2 MG/2ML IJ SOLN
INTRAMUSCULAR | Status: AC
  Filled 2019-08-14: qty 2

## 2019-08-14 MED ORDER — DIPHENHYDRAMINE HCL 12.5 MG/5ML PO ELIX
12.5000 mg | ORAL_SOLUTION | Freq: Four times a day (QID) | ORAL | Status: DC | PRN
  Administered 2019-08-16: 12.5 mg via ORAL
  Filled 2019-08-14: qty 5

## 2019-08-14 MED ORDER — HEPARIN SODIUM (PORCINE) 5000 UNIT/ML IJ SOLN
5000.0000 [IU] | Freq: Once | INTRAMUSCULAR | Status: AC
  Administered 2019-08-14: 5000 [IU] via SUBCUTANEOUS
  Filled 2019-08-14: qty 1

## 2019-08-14 MED ORDER — ALVIMOPAN 12 MG PO CAPS
12.0000 mg | ORAL_CAPSULE | Freq: Two times a day (BID) | ORAL | Status: DC
  Administered 2019-08-15: 12 mg via ORAL
  Filled 2019-08-14: qty 1

## 2019-08-14 MED ORDER — ONDANSETRON HCL 4 MG/2ML IJ SOLN: 4.0000 mg | Freq: Four times a day (QID) | INTRAMUSCULAR | Status: DC | PRN

## 2019-08-14 MED ORDER — CARVEDILOL 3.125 MG PO TABS
3.1250 mg | ORAL_TABLET | ORAL | Status: AC
  Administered 2019-08-14: 3.125 mg via ORAL
  Filled 2019-08-14: qty 1

## 2019-08-14 MED ORDER — FENTANYL CITRATE (PF) 100 MCG/2ML IJ SOLN
INTRAMUSCULAR | Status: DC | PRN
  Administered 2019-08-14: 50 ug via INTRAVENOUS
  Administered 2019-08-14: 100 ug via INTRAVENOUS
  Administered 2019-08-14 (×2): 50 ug via INTRAVENOUS

## 2019-08-14 MED ORDER — BUPIVACAINE-EPINEPHRINE (PF) 0.5% -1:200000 IJ SOLN
INTRAMUSCULAR | Status: AC
  Filled 2019-08-14: qty 30

## 2019-08-14 MED ORDER — ONDANSETRON HCL 4 MG/2ML IJ SOLN
INTRAMUSCULAR | Status: AC
  Filled 2019-08-14: qty 2

## 2019-08-14 MED ORDER — PROMETHAZINE HCL 25 MG/ML IJ SOLN
6.2500 mg | INTRAMUSCULAR | Status: DC | PRN
  Administered 2019-08-14: 12.5 mg via INTRAVENOUS

## 2019-08-14 MED ORDER — LIDOCAINE 2% (20 MG/ML) 5 ML SYRINGE
INTRAMUSCULAR | Status: AC
  Filled 2019-08-14: qty 10

## 2019-08-14 MED ORDER — PROMETHAZINE HCL 25 MG/ML IJ SOLN
INTRAMUSCULAR | Status: AC
  Filled 2019-08-14: qty 1

## 2019-08-14 MED ORDER — ROCURONIUM BROMIDE 10 MG/ML (PF) SYRINGE
PREFILLED_SYRINGE | INTRAVENOUS | Status: DC | PRN
  Administered 2019-08-14: 30 mg via INTRAVENOUS
  Administered 2019-08-14: 70 mg via INTRAVENOUS

## 2019-08-14 MED ORDER — PHENYLEPHRINE 40 MCG/ML (10ML) SYRINGE FOR IV PUSH (FOR BLOOD PRESSURE SUPPORT)
PREFILLED_SYRINGE | INTRAVENOUS | Status: DC | PRN
  Administered 2019-08-14 (×3): 80 ug via INTRAVENOUS
  Administered 2019-08-14: 120 ug via INTRAVENOUS

## 2019-08-14 MED ORDER — ONDANSETRON HCL 4 MG PO TABS: 4.0000 mg | ORAL_TABLET | Freq: Four times a day (QID) | ORAL | Status: DC | PRN

## 2019-08-14 MED ORDER — ALVIMOPAN 12 MG PO CAPS
12.0000 mg | ORAL_CAPSULE | ORAL | Status: AC
  Administered 2019-08-14: 12 mg via ORAL
  Filled 2019-08-14: qty 1

## 2019-08-14 MED ORDER — POLYETHYLENE GLYCOL 3350 17 GM/SCOOP PO POWD: 1.0000 | Freq: Once | ORAL | Status: DC

## 2019-08-14 MED ORDER — MIDAZOLAM HCL 5 MG/5ML IJ SOLN
INTRAMUSCULAR | Status: DC | PRN
  Administered 2019-08-14: 2 mg via INTRAVENOUS

## 2019-08-14 MED ORDER — ALUM & MAG HYDROXIDE-SIMETH 200-200-20 MG/5ML PO SUSP: 30.0000 mL | Freq: Four times a day (QID) | ORAL | Status: DC | PRN

## 2019-08-14 MED ORDER — EPHEDRINE 5 MG/ML INJ
INTRAVENOUS | Status: AC
  Filled 2019-08-14: qty 30

## 2019-08-14 MED ORDER — MEPERIDINE HCL 50 MG/ML IJ SOLN: 6.2500 mg | INTRAMUSCULAR | Status: DC | PRN

## 2019-08-14 MED ORDER — SUCCINYLCHOLINE CHLORIDE 20 MG/ML IJ SOLN
INTRAMUSCULAR | Status: DC | PRN
  Administered 2019-08-14: 120 mg via INTRAVENOUS

## 2019-08-14 MED ORDER — PANTOPRAZOLE SODIUM 40 MG PO TBEC
40.0000 mg | DELAYED_RELEASE_TABLET | Freq: Every day | ORAL | Status: DC
  Administered 2019-08-14 – 2019-08-16 (×3): 40 mg via ORAL
  Filled 2019-08-14 (×3): qty 1

## 2019-08-14 MED ORDER — ACETAMINOPHEN 500 MG PO TABS
1000.0000 mg | ORAL_TABLET | ORAL | Status: AC
  Administered 2019-08-14: 1000 mg via ORAL
  Filled 2019-08-14: qty 2

## 2019-08-14 MED ORDER — SODIUM CHLORIDE 0.9% IV SOLUTION
Freq: Once | INTRAVENOUS | Status: AC
  Administered 2019-08-14: 500 mL via INTRAVENOUS

## 2019-08-14 MED ORDER — LACTATED RINGERS IV SOLN: INTRAVENOUS | Status: DC | PRN

## 2019-08-14 MED ORDER — HYDROMORPHONE HCL 1 MG/ML IJ SOLN
0.5000 mg | INTRAMUSCULAR | Status: DC | PRN
  Administered 2019-08-15 (×2): 0.5 mg via INTRAVENOUS
  Filled 2019-08-14 (×2): qty 0.5

## 2019-08-14 MED ORDER — ALBUTEROL SULFATE (2.5 MG/3ML) 0.083% IN NEBU: 3.0000 mL | INHALATION_SOLUTION | Freq: Four times a day (QID) | RESPIRATORY_TRACT | Status: DC | PRN

## 2019-08-14 MED ORDER — GABAPENTIN 300 MG PO CAPS
300.0000 mg | ORAL_CAPSULE | Freq: Every day | ORAL | Status: DC
  Administered 2019-08-14 – 2019-08-15 (×2): 300 mg via ORAL
  Filled 2019-08-14 (×2): qty 1

## 2019-08-14 MED ORDER — LIDOCAINE 2% (20 MG/ML) 5 ML SYRINGE
INTRAMUSCULAR | Status: DC | PRN
  Administered 2019-08-14: 3.75 mL/h via INTRAVENOUS

## 2019-08-14 MED ORDER — 0.9 % SODIUM CHLORIDE (POUR BTL) OPTIME
TOPICAL | Status: DC | PRN
  Administered 2019-08-14: 2000 mL

## 2019-08-14 MED ORDER — INSULIN ASPART 100 UNIT/ML ~~LOC~~ SOLN
0.0000 [IU] | Freq: Three times a day (TID) | SUBCUTANEOUS | Status: DC
  Administered 2019-08-15: 2 [IU] via SUBCUTANEOUS
  Administered 2019-08-15: 3 [IU] via SUBCUTANEOUS
  Administered 2019-08-16: 2 [IU] via SUBCUTANEOUS

## 2019-08-14 MED ORDER — LACTATED RINGERS IV SOLN: INTRAVENOUS | Status: DC

## 2019-08-14 MED ORDER — BUPIVACAINE LIPOSOME 1.3 % IJ SUSP
INTRAMUSCULAR | Status: DC | PRN
  Administered 2019-08-14: 35 mL

## 2019-08-14 MED ORDER — EPHEDRINE SULFATE-NACL 50-0.9 MG/10ML-% IV SOSY
PREFILLED_SYRINGE | INTRAVENOUS | Status: DC | PRN
  Administered 2019-08-14: 10 mg via INTRAVENOUS

## 2019-08-14 MED ORDER — POLYVINYL ALCOHOL 1.4 % OP SOLN: 1.0000 [drp] | Freq: Three times a day (TID) | OPHTHALMIC | Status: DC | PRN

## 2019-08-14 MED ORDER — GLUCERNA SHAKE PO LIQD
237.0000 mL | Freq: Two times a day (BID) | ORAL | Status: DC
  Administered 2019-08-15 – 2019-08-16 (×3): 237 mL via ORAL
  Filled 2019-08-14 (×4): qty 237

## 2019-08-14 MED ORDER — FERROUS SULFATE 325 (65 FE) MG PO TABS
325.0000 mg | ORAL_TABLET | Freq: Every day | ORAL | Status: DC
  Administered 2019-08-15 – 2019-08-16 (×2): 325 mg via ORAL
  Filled 2019-08-14 (×2): qty 1

## 2019-08-14 MED ORDER — TRAMADOL HCL 50 MG PO TABS: 50.0000 mg | ORAL_TABLET | Freq: Four times a day (QID) | ORAL | 0 refills | Status: AC | PRN

## 2019-08-14 MED ORDER — LACTATED RINGERS IR SOLN
Status: DC | PRN
  Administered 2019-08-14: 1000 mL

## 2019-08-14 MED ORDER — ACETAMINOPHEN 500 MG PO TABS
1000.0000 mg | ORAL_TABLET | Freq: Four times a day (QID) | ORAL | Status: DC
  Administered 2019-08-14 – 2019-08-16 (×8): 1000 mg via ORAL
  Filled 2019-08-14 (×7): qty 2

## 2019-08-14 MED ORDER — DIPHENHYDRAMINE HCL 50 MG/ML IJ SOLN: 12.5000 mg | Freq: Four times a day (QID) | INTRAMUSCULAR | Status: DC | PRN

## 2019-08-14 MED ORDER — KETAMINE HCL 10 MG/ML IJ SOLN
INTRAMUSCULAR | Status: DC | PRN
  Administered 2019-08-14: 30 mg via INTRAVENOUS

## 2019-08-14 MED ORDER — TRAMADOL HCL 50 MG PO TABS
50.0000 mg | ORAL_TABLET | Freq: Four times a day (QID) | ORAL | Status: DC | PRN
  Administered 2019-08-15 – 2019-08-16 (×3): 50 mg via ORAL
  Filled 2019-08-14 (×3): qty 1

## 2019-08-14 MED ORDER — CALCIUM CARBONATE 1250 (500 CA) MG PO TABS
2.0000 | ORAL_TABLET | Freq: Every day | ORAL | Status: DC
  Administered 2019-08-14: 1000 mg via ORAL
  Administered 2019-08-15: 500 mg via ORAL
  Filled 2019-08-14 (×2): qty 1
  Filled 2019-08-14: qty 2
  Filled 2019-08-14: qty 1

## 2019-08-14 MED ORDER — SODIUM CHLORIDE 0.9 % IV SOLN
2.0000 g | INTRAVENOUS | Status: AC
  Administered 2019-08-14: 2 g via INTRAVENOUS
  Filled 2019-08-14: qty 2

## 2019-08-14 MED ORDER — PROPOFOL 10 MG/ML IV BOLUS
INTRAVENOUS | Status: AC
  Filled 2019-08-14: qty 20

## 2019-08-14 MED ORDER — ACETAMINOPHEN 160 MG/5ML PO SOLN: 325.0000 mg | Freq: Once | ORAL | Status: DC | PRN

## 2019-08-14 MED ORDER — METRONIDAZOLE 500 MG PO TABS: 1000.0000 mg | ORAL_TABLET | ORAL | Status: DC

## 2019-08-14 MED ORDER — ACETAMINOPHEN 325 MG PO TABS: 325.0000 mg | ORAL_TABLET | Freq: Once | ORAL | Status: DC | PRN

## 2019-08-14 MED ORDER — ONDANSETRON HCL 4 MG/2ML IJ SOLN
INTRAMUSCULAR | Status: DC | PRN
  Administered 2019-08-14: 4 mg via INTRAVENOUS

## 2019-08-14 MED ORDER — IBUPROFEN 400 MG PO TABS: 600.0000 mg | ORAL_TABLET | Freq: Four times a day (QID) | ORAL | Status: DC | PRN

## 2019-08-14 MED ORDER — FENTANYL CITRATE (PF) 100 MCG/2ML IJ SOLN: 25.0000 ug | INTRAMUSCULAR | Status: DC | PRN

## 2019-08-14 MED ORDER — HEPARIN SODIUM (PORCINE) 5000 UNIT/ML IJ SOLN
5000.0000 [IU] | Freq: Three times a day (TID) | INTRAMUSCULAR | Status: DC
  Administered 2019-08-15 – 2019-08-16 (×4): 5000 [IU] via SUBCUTANEOUS
  Filled 2019-08-14 (×4): qty 1

## 2019-08-14 MED ORDER — PROPOFOL 10 MG/ML IV BOLUS
INTRAVENOUS | Status: DC | PRN
  Administered 2019-08-14: 90 mg via INTRAVENOUS

## 2019-08-14 MED ORDER — SODIUM CHLORIDE (PF) 0.9 % IJ SOLN
INTRAMUSCULAR | Status: AC
  Filled 2019-08-14: qty 20

## 2019-08-14 MED ORDER — LIDOCAINE 2% (20 MG/ML) 5 ML SYRINGE
INTRAMUSCULAR | Status: DC | PRN
  Administered 2019-08-14: 60 mg via INTRAVENOUS

## 2019-08-14 MED ORDER — SPIRONOLACTONE 25 MG PO TABS
50.0000 mg | ORAL_TABLET | Freq: Every evening | ORAL | Status: DC
  Administered 2019-08-14 – 2019-08-15 (×2): 50 mg via ORAL
  Filled 2019-08-14 (×3): qty 2

## 2019-08-14 MED ORDER — CITALOPRAM HYDROBROMIDE 20 MG PO TABS
30.0000 mg | ORAL_TABLET | Freq: Every day | ORAL | Status: DC
  Administered 2019-08-14 – 2019-08-15 (×2): 30 mg via ORAL
  Filled 2019-08-14 (×2): qty 2

## 2019-08-14 MED ORDER — NEOMYCIN SULFATE 500 MG PO TABS: 1000.0000 mg | ORAL_TABLET | ORAL | Status: DC

## 2019-08-14 MED ORDER — BUPIVACAINE-EPINEPHRINE (PF) 0.5% -1:200000 IJ SOLN
INTRAMUSCULAR | Status: DC | PRN
  Administered 2019-08-14: 15 mL

## 2019-08-14 MED ORDER — FLUTICASONE PROPIONATE 50 MCG/ACT NA SUSP: 1.0000 | Freq: Every day | NASAL | Status: DC | PRN

## 2019-08-14 MED ORDER — DEXAMETHASONE SODIUM PHOSPHATE 10 MG/ML IJ SOLN
INTRAMUSCULAR | Status: DC | PRN
  Administered 2019-08-14: 5 mg via INTRAVENOUS

## 2019-08-14 MED ORDER — FENTANYL CITRATE (PF) 250 MCG/5ML IJ SOLN
INTRAMUSCULAR | Status: AC
  Filled 2019-08-14: qty 5

## 2019-08-14 SURGICAL SUPPLY — 116 items
APL PRP STRL LF DISP 70% ISPRP (MISCELLANEOUS) ×1
APPLIER CLIP 5 13 M/L LIGAMAX5 (MISCELLANEOUS)
APPLIER CLIP ROT 10 11.4 M/L (STAPLE)
APR CLP MED LRG 11.4X10 (STAPLE)
APR CLP MED LRG 5 ANG JAW (MISCELLANEOUS)
BLADE EXTENDED COATED 6.5IN (ELECTRODE) ×2 IMPLANT
CANNULA REDUC XI 12-8 STAPL (CANNULA) ×2
CANNULA REDUCER 12-8 DVNC XI (CANNULA) ×1 IMPLANT
CELLS DAT CNTRL 66122 CELL SVR (MISCELLANEOUS) IMPLANT
CHLORAPREP W/TINT 26 (MISCELLANEOUS) ×2 IMPLANT
CLIP APPLIE 5 13 M/L LIGAMAX5 (MISCELLANEOUS) IMPLANT
CLIP APPLIE ROT 10 11.4 M/L (STAPLE) IMPLANT
CLIP VESOLOCK LG 6/CT PURPLE (CLIP) IMPLANT
CLIP VESOLOCK MED LG 6/CT (CLIP) IMPLANT
COVER SURGICAL LIGHT HANDLE (MISCELLANEOUS) ×4 IMPLANT
COVER TIP SHEARS 8 DVNC (MISCELLANEOUS) ×1 IMPLANT
COVER TIP SHEARS 8MM DA VINCI (MISCELLANEOUS) ×2
COVER WAND RF STERILE (DRAPES) IMPLANT
DECANTER SPIKE VIAL GLASS SM (MISCELLANEOUS) ×2 IMPLANT
DEVICE TROCAR PUNCTURE CLOSURE (ENDOMECHANICALS) IMPLANT
DRAIN CHANNEL 19F RND (DRAIN) ×1 IMPLANT
DRAPE ARM DVNC X/XI (DISPOSABLE) ×4 IMPLANT
DRAPE CARDIOVASC SPLIT 84X147 (DRAPES) ×1 IMPLANT
DRAPE CARDIOVASC SPLIT 88X140 (DRAPES) ×1 IMPLANT
DRAPE COLUMN DVNC XI (DISPOSABLE) ×1 IMPLANT
DRAPE DA VINCI XI ARM (DISPOSABLE) ×8
DRAPE DA VINCI XI COLUMN (DISPOSABLE) ×2
DRAPE SURG IRRIG POUCH 19X23 (DRAPES) ×2 IMPLANT
DRSG OPSITE POSTOP 4X10 (GAUZE/BANDAGES/DRESSINGS) IMPLANT
DRSG OPSITE POSTOP 4X6 (GAUZE/BANDAGES/DRESSINGS) IMPLANT
DRSG OPSITE POSTOP 4X8 (GAUZE/BANDAGES/DRESSINGS) IMPLANT
DRSG TEGADERM 2-3/8X2-3/4 SM (GAUZE/BANDAGES/DRESSINGS) ×10 IMPLANT
DRSG TEGADERM 4X4.75 (GAUZE/BANDAGES/DRESSINGS) ×2 IMPLANT
ELECT REM PT RETURN 15FT ADLT (MISCELLANEOUS) ×2 IMPLANT
ENDOLOOP SUT PDS II  0 18 (SUTURE)
ENDOLOOP SUT PDS II 0 18 (SUTURE) IMPLANT
EVACUATOR SILICONE 100CC (DRAIN) ×1 IMPLANT
GAUZE SPONGE 2X2 8PLY STRL LF (GAUZE/BANDAGES/DRESSINGS) ×1 IMPLANT
GAUZE SPONGE 4X4 12PLY STRL (GAUZE/BANDAGES/DRESSINGS) IMPLANT
GLOVE BIO SURGEON STRL SZ 6.5 (GLOVE) ×2 IMPLANT
GLOVE BIO SURGEON STRL SZ7.5 (GLOVE) ×6 IMPLANT
GLOVE BIOGEL PI IND STRL 7.0 (GLOVE) IMPLANT
GLOVE BIOGEL PI INDICATOR 7.0 (GLOVE) ×6
GLOVE INDICATOR 8.0 STRL GRN (GLOVE) ×6 IMPLANT
GLOVE SURG SS PI 7.0 STRL IVOR (GLOVE) ×2 IMPLANT
GOWN STRL REUS W/TWL LRG LVL3 (GOWN DISPOSABLE) ×2 IMPLANT
GOWN STRL REUS W/TWL XL LVL3 (GOWN DISPOSABLE) ×10 IMPLANT
GRASPER SUT TROCAR 14GX15 (MISCELLANEOUS) IMPLANT
HOLDER FOLEY CATH W/STRAP (MISCELLANEOUS) ×2 IMPLANT
KIT PROCEDURE DA VINCI SI (MISCELLANEOUS)
KIT PROCEDURE DVNC SI (MISCELLANEOUS) IMPLANT
KIT TURNOVER KIT A (KITS) IMPLANT
LIGASURE IMPACT 36 18CM CVD LR (INSTRUMENTS) ×1 IMPLANT
NDL INSUFFLATION 14GA 120MM (NEEDLE) ×1 IMPLANT
NEEDLE INSUFFLATION 14GA 120MM (NEEDLE) ×2 IMPLANT
PACK COLON (CUSTOM PROCEDURE TRAY) ×2 IMPLANT
PAD POSITIONING PINK XL (MISCELLANEOUS) ×2 IMPLANT
PENCIL SMOKE EVACUATOR (MISCELLANEOUS) IMPLANT
PORT LAP GEL ALEXIS MED 5-9CM (MISCELLANEOUS) ×2 IMPLANT
PROTECTOR NERVE ULNAR (MISCELLANEOUS) ×3 IMPLANT
RELOAD STAPLE 45 BLU REG DVNC (STAPLE) IMPLANT
RELOAD STAPLE 45 GRN THCK DVNC (STAPLE) IMPLANT
RETRACTOR WND ALEXIS 18 MED (MISCELLANEOUS) IMPLANT
RTRCTR WOUND ALEXIS 18CM MED (MISCELLANEOUS)
SCISSORS LAP 5X35 DISP (ENDOMECHANICALS) ×1 IMPLANT
SEAL CANN UNIV 5-8 DVNC XI (MISCELLANEOUS) ×4 IMPLANT
SEAL XI 5MM-8MM UNIVERSAL (MISCELLANEOUS) ×8
SEALER VESSEL DA VINCI XI (MISCELLANEOUS) ×2
SEALER VESSEL EXT DVNC XI (MISCELLANEOUS) ×1 IMPLANT
SET IRRIG TUBING LAPAROSCOPIC (IRRIGATION / IRRIGATOR) ×2 IMPLANT
SLEEVE ADV FIXATION 5X100MM (TROCAR) IMPLANT
SOLUTION ELECTROLUBE (MISCELLANEOUS) ×2 IMPLANT
SPONGE GAUZE 2X2 STER 10/PKG (GAUZE/BANDAGES/DRESSINGS) ×1
STAPLER 45 BLU RELOAD XI (STAPLE) IMPLANT
STAPLER 45 BLUE RELOAD XI (STAPLE)
STAPLER 45 GREEN RELOAD XI (STAPLE)
STAPLER 45 GRN RELOAD XI (STAPLE) IMPLANT
STAPLER CANNULA SEAL DVNC XI (STAPLE) ×1 IMPLANT
STAPLER CANNULA SEAL XI (STAPLE) ×2
STAPLER CUT CVD 40MM GREEN (STAPLE) ×1 IMPLANT
STAPLER CUT RELOAD GREEN (STAPLE) ×1 IMPLANT
STAPLER ECHELON POWER CIR 29 (STAPLE) ×1 IMPLANT
STAPLER SHEATH (SHEATH)
STAPLER SHEATH ENDOWRIST DVNC (SHEATH) ×1 IMPLANT
STOPCOCK 4 WAY LG BORE MALE ST (IV SETS) ×4 IMPLANT
SURGILUBE 2OZ TUBE FLIPTOP (MISCELLANEOUS) ×1 IMPLANT
SUT MNCRL AB 4-0 PS2 18 (SUTURE) ×2 IMPLANT
SUT PDS AB 1 CT1 27 (SUTURE) IMPLANT
SUT PDS AB 1 TP1 96 (SUTURE) IMPLANT
SUT PROLENE 0 CT 2 (SUTURE) IMPLANT
SUT PROLENE 2 0 KS (SUTURE) ×2 IMPLANT
SUT PROLENE 2 0 SH DA (SUTURE) IMPLANT
SUT SILK 2 0 (SUTURE)
SUT SILK 2 0 SH CR/8 (SUTURE) IMPLANT
SUT SILK 2-0 18XBRD TIE 12 (SUTURE) IMPLANT
SUT SILK 3 0 (SUTURE) ×2
SUT SILK 3 0 SH CR/8 (SUTURE) ×3 IMPLANT
SUT SILK 3-0 18XBRD TIE 12 (SUTURE) ×1 IMPLANT
SUT V-LOC BARB 180 2/0GR6 GS22 (SUTURE)
SUT VIC AB 3-0 SH 18 (SUTURE) IMPLANT
SUT VIC AB 3-0 SH 27 (SUTURE)
SUT VIC AB 3-0 SH 27XBRD (SUTURE) IMPLANT
SUT VICRYL 0 UR6 27IN ABS (SUTURE) ×2 IMPLANT
SUTURE V-LC BRB 180 2/0GR6GS22 (SUTURE) IMPLANT
SYR 10ML LL (SYRINGE) ×2 IMPLANT
SYS LAPSCP GELPORT 120MM (MISCELLANEOUS)
SYSTEM LAPSCP GELPORT 120MM (MISCELLANEOUS) IMPLANT
TAPE UMBILICAL COTTON 1/8X30 (MISCELLANEOUS) ×1 IMPLANT
TOWEL OR NON WOVEN STRL DISP B (DISPOSABLE) ×2 IMPLANT
TRAY FOLEY MTR SLVR 14FR STAT (SET/KITS/TRAYS/PACK) ×1 IMPLANT
TROCAR ADV FIXATION 5X100MM (TROCAR) ×2 IMPLANT
TROCAR BLADELESS OPT 5 150 (ENDOMECHANICALS) ×1 IMPLANT
TUBING CONNECTING 10 (TUBING) ×5 IMPLANT
TUBING ENDO SMARTCAP CO2 EXT (MISCELLANEOUS) ×1 IMPLANT
TUBING ENDOGATOR IRRIG (IRRIGATION / IRRIGATOR) ×1 IMPLANT
TUBING INSUFFLATION 10FT LAP (TUBING) ×2 IMPLANT

## 2019-08-14 NOTE — Anesthesia Procedure Notes (Signed)
Procedure Name: Intubation Performed by: Gean Maidens, CRNA Pre-anesthesia Checklist: Patient identified, Emergency Drugs available, Suction available, Patient being monitored and Timeout performed Patient Re-evaluated:Patient Re-evaluated prior to induction Oxygen Delivery Method: Circle system utilized Preoxygenation: Pre-oxygenation with 100% oxygen Induction Type: IV induction Ventilation: Mask ventilation without difficulty Laryngoscope Size: Mac and 4 Grade View: Grade I Tube type: Oral Tube size: 7.0 mm Number of attempts: 1 Airway Equipment and Method: Stylet Placement Confirmation: ETT inserted through vocal cords under direct vision,  positive ETCO2 and breath sounds checked- equal and bilateral Secured at: 21 cm Tube secured with: Tape Dental Injury: Teeth and Oropharynx as per pre-operative assessment

## 2019-08-14 NOTE — Op Note (Signed)
PATIENT: Tonya Dougherty  69 y.o. female  Patient Care Team: Gay Filler, MD as PCP - General (Internal Medicine)  PREOP DIAGNOSIS: COLON CANCER  POSTOP DIAGNOSIS: COLON CANCER  PROCEDURE: 1. Robotic assisted low anterior resection with colorectal anastomosis 2. Flexible sigmoidoscopy  SURGEON: Sharon Mt. Dema Severin, MD  ASSISTANT: Leighton Ruff, MD  ANESTHESIA: General endotracheal  EBL: 150 mL 1U PRBC transfused during procedure Total I/O In: 1887 [I.V.:1500; Blood:287; IV Piggyback:100] Out: 250 [Urine:100; Blood:150]  DRAINS: None  SPECIMEN: 1. Rectosigmoid colon - suture marks distal end 2. Distal anastomotic donut - "final distal margin"  COUNTS: Sponge, needle and instrument counts were reported correct x2  FINDINGS:  No obvious metastatic disease on visceral parietal peritoneum or liver. The anastomosis is located 15 cm from the anal verge by flexible sigmoidoscopy.  STATEMENT OF MEDICAL NECESSITY: Tonya Dougherty is a very pleasant 79yoF with hx of HTN, HLD, DM, ?hx DVT, gastritis/bleed whom presented with fatigue and iron deficiency anemia. She underwent EGD and colonoscopy 05/29/19 which demonstrated ulcerated mass in distal sigmoid, tattooed, biopsied with TVA/HGD with findings suspicious for invasive adenocarcinoma. She had a CEA of 5. CT CAP demonstrated no evidence of metastatic disease but did show nodular contour of the liver suggestive of cirrhosis. Total bilirubin, INR, platelets relatively normal. Albumin has fluctuated between 3.0 and normal. We met in the office and went over everything at length. We discussed options moving forward and she opted to pursue surgery. Please refer to notes elsewhere for details regarding this discussion.  NARRATIVE: Informed consent was verified. The patient was taken to the operating room, placed supine on the operating table and SCD's were applied. General endotracheal anesthesia was induced. The patient was then positioned  in the lithotomy position with Allen stirrups. Pressure points were then evaluated and padded. Antibiotics were given.  Tonya Dougherty abdomen was then prepped and draped in the standard sterile fashion. Surgical timeout confirmed our plan.   An OG tube was placed to suction by anesthesia.  At Palmer's point, a stab incision was created and the Veress needle introduced in the peritoneal cavity.  Intraperitoneal location was confirmed with the aspiration and saline drop test.  Pneumoperitoneum was established to 15 mmHg with CO2.  Just to the right of and cephalad to the umbilicus an 8 mm blunt tipped robotic trocar was carefully placed.  The laparoscope was inserted and demonstrated no evidence of trocar site or Veress needle site complications.  There was one small band of omentum adherent to Tonya Dougherty falciform but no other adhesions present.  The liver was nodular consistent with Tonya Dougherty history of cirrhosis.  There were no gross metastatic appearing lesions on the surface of the liver or along Tonya Dougherty peritoneum.  3 additional 8 mm robotic trochars were placed in a line extending from Tonya Dougherty left upper quadrant down towards Tonya Dougherty right ASIS.  A 5 mm assist port was placed to the lateral most portion of Tonya Dougherty right lower quadrant. All trocars were placed under direct visualization.  She was then positioned in Trendelenburg with some right side down.  The robot was then docked.  Instruments were placed under direct visualization.  I then went to the console.  All of Tonya Dougherty mesenteric fat was very friable and delicate.  With minimal manipulation of tissues, the mesentery would tear.  The omentum was taken down off the anterior abdominal wall with the vessel sealer.  She did have evidence of varicosities in Tonya Dougherty omentum.  The mesentery had varicosities within it as well.  Given all of these findings and Tonya Dougherty symptoms, the decision was made to proceed with undergoing a low anterior resection but without high vascular ligation.  It also became  apparent that continuing with the robotic energy devices for hemostasis purposes would increase the risk of significant blood loss.  The sigmoid colon had been identified and evaluated.  The tattoo was seen.  There was a mass palpable through the surface of the sigmoid.  The sigmoid was mobilized off of the intersigmoid fossa carefully. The left ureter was down and away from this plane of dissection.  She had a relatively redundant sigmoid and the mass was in the mid/distal portion of it.  At this point, there was sufficient mobilization of the colon such that a segmental resection of the segment could be performed.  This would require however that the distal point of transection would be on the proximal portion of the rectum is at this location the tinea has played in the appendices epiploica were gone.  This overlied the sacral promontory.  Given the friability and oozing present from within Tonya Dougherty mesentery, the decision was made to do this portion of the procedure through a Pfannenstiel incision as opposed to with the robotic vessel sealer.  The robot was then undocked.  A Pfannenstiel incision was created 3 fingerbreadths above the pubic symphysis.  Dissection was carried down to the subcutaneous tissue electrocautery.  The rectus fascia was incised transversely and flaps were elevated cephalad and caudad.  With pneumoperitoneum in place, the peritoneum was carefully opened bluntly.  An Alexis wound protector was placed and towels were placed around the field.  The sigmoid was able to be delivered through this wound.  The mass was palpated.  Beginning with a proximal point of transection, a window was created in the mesentery of the sigmoid.  The colon was then divided with a contoured stapler, green load.  The mesentery was then ligated down to the distal point of planned transection using the LigaSure staying relatively close to the colon wall and away from the left ureter as well.  For hemostasis, multiple  2-0 silk sutures were placed for hemostatic purposes.  A high ligation of the IMA was not undertaken given the segmental nature of this resection with Tonya Dougherty underlying significant cirrhosis, portal hypertension, and mesenteric varices.  The distal point of transection was approached.  A window was created in the mesentery and a contoured green load stapler was placed on the proximal rectum.  This was then closed, held, and then fired.  The intervening piece of mesentery is then ligated with the LigaSure.  The mesentery was inspected and irrigated.  Hemostasis was noted to be achieved.  There was a palpable pulse in the mesentery at both ends of colon.  These were well perfused.  They are easily able to come together without any tension whatsoever.  The staple line on the proximal end was trimmed.  A 2-0 Prolene pursestring was created.  A 29 mm EEA stapler was selected.  The anvil was placed in the pursestring tied.  I then went below.  Sizers were passed without difficulty.  The EEA stapler was then carefully passed up the rectum and the spike deployed just anterior to the staple line.  The components were then mated and orientation confirmed such that there is no twisting or bowel underneath the mesentery.  The stapler was then closed, held, and fired.  The donuts were complete.  The colon proximal to the anastomosis was then gently  occluded and the flexible sigmoidoscope was introduced to the anus and up to the level of the anastomosis.  The colon was well distended.  Sterile saline had been placed in the pelvis.  The anastomosis was airtight.  It remained pink and was hemostatic endoscopically.  Irrigation was evacuated and I scrubbed back in.  The abdomen was surveyed and hemostasis was appreciated.  We then switched to clean gowns, instruments, and equipment.  Additional sterile drapes were placed around the field.  Attention was turned to closing the abdomen.  The peritoneum at the level of the Pfannenstiel  incision was closed with a running 0 Vicryl suture.  The fascia was then closed with 2 running #1 PDS sutures.  Additional anesthetic was infiltrated.  The subcutaneous tissues were irrigated and hemostasis verified.  The skin of all incision sites was closed with 4-0 Monocryl subcuticular suture.  Dermabond was then placed over the incisions and a honeycomb dressing over the Pfannenstiel.  She was then taken out of lithotomy position, awakened anesthesia, extubated, and transferred to stretcher transport to PACU in satisfactory condition.    An MD assistant was necessary for tissue manipulation, retraction and positioning due to the complexity of the case, obesity of the patient and hospital policies  DISPOSITION: PACU in satisfactory condition

## 2019-08-14 NOTE — Anesthesia Postprocedure Evaluation (Signed)
Anesthesia Post Note  Patient: Shanley Hauschild  Procedure(s) Performed: XI ROBOT ASSISTED SIGMOIDECTOMY (N/A ) FLEXIBLE SIGMOIDOSCOPY (N/A )     Patient location during evaluation: PACU Anesthesia Type: General Level of consciousness: awake and alert Pain management: pain level controlled Vital Signs Assessment: post-procedure vital signs reviewed and stable Respiratory status: spontaneous breathing, nonlabored ventilation, respiratory function stable and patient connected to nasal cannula oxygen Cardiovascular status: blood pressure returned to baseline and stable Postop Assessment: no apparent nausea or vomiting Anesthetic complications: no    Last Vitals:  Vitals:   08/14/19 1630 08/14/19 1703  BP: (!) 147/80 (!) 143/93  Pulse: 68 72  Resp: 16 12  Temp:    SpO2: 93% 100%    Last Pain:  Vitals:   08/14/19 1630  TempSrc:   PainSc: 0-No pain                 Effie Berkshire

## 2019-08-14 NOTE — Anesthesia Preprocedure Evaluation (Addendum)
Anesthesia Evaluation  Patient identified by MRN, date of birth, ID band Patient awake    Reviewed: Allergy & Precautions, NPO status , Patient's Chart, lab work & pertinent test results  Airway Mallampati: I  TM Distance: >3 FB Neck ROM: Full    Dental  (+) Edentulous Upper, Edentulous Lower   Pulmonary Current Smoker,    breath sounds clear to auscultation       Cardiovascular hypertension, Pt. on home beta blockers and Pt. on medications  Rhythm:Regular Rate:Normal     Neuro/Psych PSYCHIATRIC DISORDERS Anxiety Depression  Neuromuscular disease    GI/Hepatic GERD  Medicated,(+) Hepatitis -  Endo/Other  diabetes, Type 2, Insulin Dependent  Renal/GU Renal disease     Musculoskeletal  (+) Arthritis ,   Abdominal Normal abdominal exam  (+)   Peds  Hematology   Anesthesia Other Findings   Reproductive/Obstetrics                            Lab Results  Component Value Date   WBC 5.2 08/06/2019   HGB 9.6 (L) 08/06/2019   HCT 33.7 (L) 08/06/2019   MCV 85.1 08/06/2019   PLT 146 (L) 08/06/2019   Lab Results  Component Value Date   CREATININE 1.06 (H) 08/06/2019   BUN 21 08/06/2019   NA 138 08/06/2019   K 4.1 08/06/2019   CL 104 08/06/2019   CO2 28 08/06/2019   Lab Results  Component Value Date   INR 1.1 08/06/2019   INR 1.11 10/22/2017   INR 1.3 03/20/2013   EKG: normal sinus rhythm, PAC's noted.   Anesthesia Physical Anesthesia Plan  ASA: III  Anesthesia Plan: General   Post-op Pain Management:    Induction: Intravenous  PONV Risk Score and Plan: 3 and Ondansetron, Dexamethasone, Midazolam and Treatment may vary due to age or medical condition  Airway Management Planned: Oral ETT  Additional Equipment: None  Intra-op Plan:   Post-operative Plan: Extubation in OR  Informed Consent: I have reviewed the patients History and Physical, chart, labs and discussed the  procedure including the risks, benefits and alternatives for the proposed anesthesia with the patient or authorized representative who has indicated his/her understanding and acceptance.     Dental advisory given  Plan Discussed with: CRNA  Anesthesia Plan Comments:         Anesthesia Quick Evaluation

## 2019-08-14 NOTE — H&P (Signed)
CC: Referred by Dr. Fuller Plan for newly diagnosed distal sigmoid colon cancer - here today for surgery  HPI: Ms. Ramachandran is a very pleasant 80yoF with hx of HTN, HLD, DM, ?hx DVT, gastritis, whom had a recent history of workup for fatigue and demonstrated iron deficiency anemia. Further workup including colonoscopy and EGD 05/29/2019 done by Dr. Fuller Plan demonstrated:  1. Ulcerated mass in the distal sigmoid, 25 cm from anal verge, circumferential and friable. Biopsied. Tattooed just distal to the mass. 2. 7 polyps between 3 and 10 mm were removed from the remaining colon. 3. Multiple diverticula and sigmoid. 4. Appendiceal orifice somewhat prominent.  Biopsy of the mass in the sigmoid returned tubulovillous adenoma with high-grade dysplasia and some deeper glands of an hyperplastic muscular mucosa. , As were all cemented path report that there were suspicious findings on this biopsy specimen for invasive adenocarcinoma as well. EGD is normal.  CEA 5 Hemoglobin 10.1 Total bilirubin 0.7  Staging CT chest/abdomen/pelvis 06/05/19 showed a circumferential mass in the mid to distal sigmoid. No evidence of lymphadenopathy or metastatic disease in the chest/abdomen/pelvis. Normal-appearing appendix. Coarse nodular contour of the liver suggestive of cirrhosis.  She is here today for surgery. She has been doing well. She denies any complaints today or changes in her health/history/medications. She denies any history of abdominal pain, nausea/vomiting, blood visible in her stool.  PMH: HTN (well-controlled on oral antihypertensive); DM (well-controlled on insulin); GERD (well-controlled with PPI)  PSH: Right foot surgery following fracture but denies any other surgical history  FHx: Denies FHx of malignancy  Social: Denies use of tobacco/EtOH/drugs. She is retired but previously worked in Doctor, hospital.  ROS: A comprehensive 10 system review of systems was completed with  the patient and pertinent findings as noted above.  Past Medical History:  Diagnosis Date  . Allergic rhinitis   . Anxiety   . Arthritis   . Bell's palsy   . Cataract   . Cirrhosis ([place])   . Coarse tremors   . Colon cancer ([place])   . Diabetes mellitus without complication ([place])   . Diverticulosis   . DVT (deep venous thrombosis) ([place])   . GERD (gastroesophageal reflux disease)   . GI bleed   . Hepatitis B   . History of blood transfusion 05/2019  . History of panic attacks   . Hypertension   . Insomnia   . Iron deficiency anemia   . MDD (major depressive disorder)   . Morbid obesity ([place])   . Neuropathy   . Portal vein thrombosis   . Psoriasis   . Renal insufficiency     Past Surgical History:  Procedure Laterality Date  . ANKLE SURGERY Right   . CATARACT EXTRACTION, BILATERAL    . COLONOSCOPY WITH ESOPHAGOGASTRODUODENOSCOPY (EGD)  05/2019    Family History  Problem Relation Age of Onset  . Kidney failure Mother   . Diabetes Mother   . Obesity Mother   . Hypertension Mother   . Cirrhosis Father   . Hypertension Sister   . Kidney failure Brother   . Hypertension Brother   . Hypertension Sister   . Alcoholism Brother   . Other Brother        MRSA  . Cirrhosis Daughter   . Drug abuse Daughter     Social:  reports that she has been smoking cigarettes. She has been smoking about 0.50 packs per day. She has never used smokeless tobacco. She reports previous drug use. She reports that she  does not drink alcohol.  Allergies:  Allergies  Allergen Reactions  . Xarelto [Rivaroxaban] Rash    Severe rash with itching  . Erythromycin     Oral Thrush  . Lipitor [Atorvastatin]     Leg cramps  . Pravachol [Pravastatin]     Leg muscle cramps  . Ivp Dye [Iodinated Diagnostic Agents] Rash    Medications: I have reviewed the patient's current medications.  Results for orders placed or performed during the hospital encounter of 08/14/19 (from the past 48 hour(s))    Glucose, capillary     Status: Abnormal   Collection Time: 08/14/19 11:05 AM  Result Value Ref Range   Glucose-Capillary 120 (H) 70 - 99 mg/dL    Comment: Glucose reference range applies only to samples taken after fasting for at least 8 hours.    No results found.  ROS - all of the below systems have been reviewed with the patient and positives are indicated with bold text General: chills, fever or night sweats Eyes: blurry vision or double vision ENT: epistaxis or sore throat Allergy/Immunology: itchy/watery eyes or nasal congestion Hematologic/Lymphatic: bleeding problems, blood clots or swollen lymph nodes Endocrine: temperature intolerance or unexpected weight changes Breast: new or changing breast lumps or nipple discharge Resp: cough, shortness of breath, or wheezing CV: chest pain or dyspnea on exertion GI: as per HPI GU: dysuria, trouble voiding, or hematuria MSK: joint pain or joint stiffness Neuro: TIA or stroke symptoms Derm: pruritus and skin lesion changes Psych: anxiety and depression  PE Blood pressure 138/67, pulse 62, temperature 98 F (36.7 C), temperature source Oral, resp. rate 18, SpO2 98 %. Constitutional: NAD; conversant Eyes: Moist conjunctiva; no lid lag; anicteric; pupils equal/round Lungs: Normal respiratory effort CV: RRR; no pitting edema GI: Abd obese, soft, NT/ND; no palpable hepatosplenomegaly MSK: Normal range of motion of extremities Psychiatric: Appropriate affect; alert and oriented x3  Results for orders placed or performed during the hospital encounter of 08/14/19 (from the past 48 hour(s))  Glucose, capillary     Status: Abnormal   Collection Time: 08/14/19 11:05 AM  Result Value Ref Range   Glucose-Capillary 120 (H) 70 - 99 mg/dL    Comment: Glucose reference range applies only to samples taken after fasting for at least 8 hours.    No results found.   A/P: Ms. Bernau is a very pleasant 89yoF with hx of HTN, HLD, DM, GERD,  ?dvt here today for newly diagnosed mid/distal sigmoid colon cancer - previous history of possible blood clot and was on Eliquis was but has been off since December as per her primary doctor  -Staging CT scans demonstrate no evidence of metastatic disease CEA 5.0 -The anatomy and physiology of the GI tract was discussed at length with the patient. The pathophysiology of colon polyps and cancer was discussed at length with associated pictures. -We discussed options before including surgery which offers the highest probability/chance for treatment and care. We discussed robotic sigmoidectomy, possible low anterior resection, flexible sigmoidoscopy. -The planned procedure, material risks (including, but not limited to, pain, bleeding, infection, scarring, need for blood transfusion, damage to surrounding structures- blood vessels/nerves/viscus/organs, damage to ureter, urine leak, leak from anastomosis, need for additional procedures, worsening of pre-existing medical conditions, need for stoma which may be permanent, hernia, recurrence of cancer despite surgery, DVT/PE, pneumonia, heart attack, stroke, death) benefits and alternatives to surgery were discussed at length. The patient's questions were answered to her satisfaction, she voiced understanding and elected to proceed with  surgery pending clearance by her PCP. Additionally, we discussed typical postoperative expectations and the recovery process.  Sharon Mt. Dema Severin, M.D. [facility] Use [email or web address] to contact on call provider

## 2019-08-14 NOTE — Transfer of Care (Signed)
Immediate Anesthesia Transfer of Care Note  Patient: Tonya Dougherty  Procedure(s) Performed: XI ROBOT ASSISTED SIGMOIDECTOMY (N/A ) FLEXIBLE SIGMOIDOSCOPY (N/A )  Patient Location: PACU  Anesthesia Type:General  Level of Consciousness: sedated, patient cooperative and responds to stimulation  Airway & Oxygen Therapy: Patient Spontanous Breathing and Patient connected to face mask oxygen  Post-op Assessment: Report given to RN and Post -op Vital signs reviewed and stable  Post vital signs: Reviewed and stable  Last Vitals:  Vitals Value Taken Time  BP    Temp    Pulse    Resp    SpO2      Last Pain:  Vitals:   08/14/19 1105  TempSrc:   PainSc: 5       Patients Stated Pain Goal: 3 ([id number])  Complications: No apparent anesthesia complications

## 2019-08-14 NOTE — Discharge Instructions (Addendum)
POST OP INSTRUCTIONS AFTER COLON SURGERY  1. DIET: Be sure to include lots of fluids daily to stay hydrated - 64oz of water per day (8, 8 oz glasses).  Avoid fast food or heavy meals for the first couple of weeks as your are more likely to get nauseated. Avoid raw/uncooked fruits or vegetables for the first 4 weeks (its ok to have these if they are blended into smoothie form). If you have fruits/vegetables, make sure they are cooked until soft enough to mash on the roof of your mouth and chew your food well. Otherwise, diet as tolerated.  2. Take your usually prescribed home medications unless otherwise directed.  3. PAIN CONTROL: a. Pain is best controlled by a usual combination of three different methods TOGETHER: i. Ice/Heat ii. Over the counter pain medication iii. Prescription pain medication b. Most patients will experience some swelling and bruising around the surgical site.  Ice packs or heating pads (30-60 minutes up to 6 times a day) will help. Some people prefer to use ice alone, heat alone, alternating between ice & heat.  Experiment to what works for you.  Swelling and bruising can take several weeks to resolve.   c. It is helpful to take an over-the-counter pain medication regularly for the first few weeks: i. Ibuprofen (Motrin/Advil) - 200mg  tabs - take 3 tabs (600mg ) every 6 hours as needed for pain (unless you have been directed previously to avoid NSAIDs/ibuprofen) ii. Acetaminophen (Tylenol) - you may take 650mg  every 6 hours as needed. You can take this with motrin as they act differently on the body. If you are taking a narcotic pain medication that has acetaminophen in it, do not take over the counter tylenol at the same time. iii. NOTE: You may take both of these medications together - most patients  find it most helpful when alternating between the two (i.e. Ibuprofen at 6am, tylenol at 9am, ibuprofen at 12pm ...) d. A  prescription for pain medication should be given to you  upon discharge.  Take your pain medication as prescribed if your pain is not adequatly controlled with the over-the-counter pain reliefs mentioned above.  4. Avoid getting constipated.  Between the surgery and the pain medications, it is common to experience some constipation.  Increasing fluid intake and taking a fiber supplement (such as Metamucil, Citrucel, FiberCon, MiraLax, etc) 1-2 times a day regularly will usually help prevent this problem from occurring.  A mild laxative (prune juice, Milk of Magnesia, MiraLax, etc) should be taken according to package directions if there are no bowel movements after 48 hours.    5. Dressing: Your incisions are covered in Dermabond which is like sterile superglue for the skin. This will come off on it's own in a couple weeks. It is waterproof and you may bathe normally starting the day after your surgery in a shower. Avoid baths/pools/lakes/oceans until your wounds have fully healed.  Keep gauze on your larger incision and change daily.  6. ACTIVITIES as tolerated:   a. Avoid heavy lifting (>10lbs or 1 gallon of milk) for the next 6 weeks. b. You may resume regular daily activities as tolerated--such as daily self-care, walking, climbing stairs--gradually increasing activities as tolerated.  If you can walk 30 minutes without difficulty, it is safe to try more intense activity such as jogging, treadmill, bicycling, low-impact aerobics.  c. DO NOT PUSH THROUGH PAIN.  Let pain be your guide: If it hurts to do something, don't do it. d. Tonya Dougherty may drive  when you are no longer taking prescription pain medication, you can comfortably wear a seatbelt, and you can safely maneuver your car and apply brakes.  7. FOLLOW UP in our office a. Please call [facility] at [phone] to set up an appointment to see your surgeon in the office for a follow-up appointment approximately 2 weeks after your surgery. b. Make sure that you call for this appointment the day you arrive home to  insure a convenient appointment time.  9. If you have disability or family leave forms that need to be completed, you may have them completed by your primary care physician's office; for return to work instructions, please ask our office staff and they will be happy to assist you in obtaining this documentation   When to call us [phone]: 1. Poor pain control 2. Reactions / problems with new medications (rash/itching, etc)  3. Fever over 101.5 F (38.5 C) 4. Inability to urinate 5. Nausea/vomiting 6. Worsening swelling or bruising 7. Continued bleeding from incision. 8. Increased pain, redness, or drainage from the incision  The clinic staff is available to answer your questions during regular business hours (8:30am-5pm).  Please don't hesitate to call and ask to speak to one of our nurses for clinical concerns.   A surgeon from [facility] is always on call at the hospitals   If you have a medical emergency, go to the nearest emergency room or call 911.  [facility], [place] [address], [place], [place], [place]  [address] MAIN: [phone] FAX: [phone] [email or web address]

## 2019-08-15 LAB — CBC
HCT: 33.2 % — ABNORMAL LOW (ref 36.0–46.0)
Hemoglobin: 9.6 g/dL — ABNORMAL LOW (ref 12.0–15.0)
MCH: 25 pg — ABNORMAL LOW (ref 26.0–34.0)
MCHC: 28.9 g/dL — ABNORMAL LOW (ref 30.0–36.0)
MCV: 86.5 fL (ref 80.0–100.0)
Platelets: 148 10*3/uL — ABNORMAL LOW (ref 150–400)
RBC: 3.84 MIL/uL — ABNORMAL LOW (ref 3.87–5.11)
RDW: 18.9 % — ABNORMAL HIGH (ref 11.5–15.5)
WBC: 7.6 10*3/uL (ref 4.0–10.5)
nRBC: 0 % (ref 0.0–0.2)

## 2019-08-15 LAB — BASIC METABOLIC PANEL
Anion gap: 4 — ABNORMAL LOW (ref 5–15)
BUN: 16 mg/dL (ref 8–23)
CO2: 27 mmol/L (ref 22–32)
Calcium: 8.5 mg/dL — ABNORMAL LOW (ref 8.9–10.3)
Chloride: 106 mmol/L (ref 98–111)
Creatinine, Ser: 0.75 mg/dL (ref 0.44–1.00)
GFR calc Af Amer: >60 mL/min (ref >60)
GFR calc non Af Amer: >60 mL/min (ref >60)
Glucose, Bld: 137 mg/dL — ABNORMAL HIGH (ref 70–99)
Potassium: 4.1 mmol/L (ref 3.5–5.1)
Sodium: 137 mmol/L (ref 135–145)

## 2019-08-15 LAB — GLUCOSE, CAPILLARY
Glucose-Capillary: 127 mg/dL — ABNORMAL HIGH (ref 70–99)
Glucose-Capillary: 160 mg/dL — ABNORMAL HIGH (ref 70–99)
Glucose-Capillary: 117 mg/dL — ABNORMAL HIGH (ref 70–99)
Glucose-Capillary: 146 mg/dL — ABNORMAL HIGH (ref 70–99)

## 2019-08-15 LAB — MAGNESIUM: Magnesium: 1.9 mg/dL (ref 1.7–2.4)

## 2019-08-15 LAB — PHOSPHORUS: Phosphorus: 3.6 mg/dL (ref 2.5–4.6)

## 2019-08-15 MED ORDER — OXYCODONE-ACETAMINOPHEN 7.5-325 MG PO TABS: 1.0000 | ORAL_TABLET | Freq: Three times a day (TID) | ORAL | Status: DC | PRN

## 2019-08-15 NOTE — Evaluation (Signed)
Occupational Therapy Evaluation Patient Details Name: Tonya Dougherty MRN: [id number] DOB: 06-Oct-1950 Today's Date: 08/15/2019    History of Present Illness Pt s/p Lowe anterior resection with colorectal anastomosis 2* colon CA 08/14/19.  Pt with hx of obesity, MDD, panic attacks, and Psoriasis.`   Clinical Impression   Pt admitted with the above. Pt currently with functional limitations due to the deficits listed below (see OT Problem List).  Pt will benefit from skilled OT to increase their safety and independence with ADL and functional mobility for ADL to facilitate discharge to venue listed below.  Pts daugther will A as needed.  Pt very pleasant!     Follow Up Recommendations  No OT follow up;Supervision/Assistance - 24 hour    Equipment Recommendations  None recommended by OT    Recommendations for Other Services       Precautions / Restrictions Precautions Precautions: Fall Restrictions Weight Bearing Restrictions: No      Mobility Bed Mobility Overal bed mobility: Needs Assistance Bed Mobility: Supine to Sit;Sit to Supine     Supine to sit: Min assist Sit to supine: Min assist   General bed mobility comments: use of bed rails and cues to perform modified log roll  Transfers Overall transfer level: Needs assistance Equipment used: Rolling walker (2 wheeled) Transfers: Sit to/from Stand Sit to Stand: Min assist;From elevated surface         General transfer comment: cues for safe transition position and use of UEs to self assist    Balance Overall balance assessment: Needs assistance Sitting-balance support: Feet supported;No upper extremity supported Sitting balance-Leahy Scale: Good     Standing balance support: Bilateral upper extremity supported Standing balance-Leahy Scale: Fair                             ADL either performed or assessed with clinical judgement   ADL Overall ADL's : Needs assistance/impaired Eating/Feeding: Set  up;Sitting   Grooming: Set up;Sitting   Upper Body Bathing: Set up;Sitting   Lower Body Bathing: Minimal assistance;Sit to/from stand   Upper Body Dressing : Set up;Sitting   Lower Body Dressing: Minimal assistance;Sit to/from stand;Cueing for safety   Toilet Transfer: Minimal assistance;Ambulation;Cueing for safety;Cueing for sequencing;RW   Toileting- Clothing Manipulation and Hygiene: Minimal assistance;Sit to/from stand       Functional mobility during ADLs: Minimal assistance;Rolling walker       Vision Patient Visual Report: No change from baseline       Perception     Praxis      Pertinent Vitals/Pain Pain Assessment: No/denies pain Faces Pain Scale: Hurts little more Pain Location: lower abdomen Pain Descriptors / Indicators: Aching;Sore Pain Intervention(s): Limited activity within patient's tolerance;Monitored during session;Premedicated before session;Ice applied     Hand Dominance     Extremity/Trunk Assessment Upper Extremity Assessment Upper Extremity Assessment: Generalized weakness   Lower Extremity Assessment Lower Extremity Assessment: Generalized weakness       Communication Communication Communication: No difficulties   Cognition Arousal/Alertness: Awake/alert Behavior During Therapy: WFL for tasks assessed/performed Overall Cognitive Status: Within Functional Limits for tasks assessed                                                Home Living Family/patient expects to be discharged to:: Private residence Living Arrangements: Other relatives;Non-relatives/Friends  Available Help at Discharge: Family;Available 24 hours/day Type of Home: House Home Access: Stairs to enter [employer] of Steps: 1   Home Layout: Two level;Able to live on main level with bedroom/bathroom     Bathroom Shower/Tub: Walk-in shower         Home Equipment: Environmental consultant - 2 wheels;Walker - 4 wheels          Prior  Functioning/Environment Level of Independence: Independent with assistive device(s)        Comments: used walker some if she went somewhere        OT Problem List: Decreased strength;Decreased activity tolerance      OT Treatment/Interventions: Self-care/ADL training;Patient/family education;DME and/or AE instruction;Therapeutic activities    OT Goals(Current goals can be found in the care plan section) Acute Rehab OT Goals Patient Stated Goal: Regain IND OT Goal Formulation: With patient Time For Goal Achievement: 08/22/19 Potential to Achieve Goals: Good  OT Frequency: Min 2X/week   Barriers to D/C:            Co-evaluation   Reason for Co-Treatment: For patient/therapist safety PT goals addressed during session: Mobility/safety with mobility OT goals addressed during session: ADL's and self-care      AM-PAC OT "6 Clicks" Daily Activity     Outcome Measure Help from another person eating meals?: None Help from another person taking care of personal grooming?: A Little Help from another person toileting, which includes using toliet, bedpan, or urinal?: A Little Help from another person bathing (including washing, rinsing, drying)?: A Little Help from another person to put on and taking off regular upper body clothing?: A Little Help from another person to put on and taking off regular lower body clothing?: A Little 6 Click Score: 19   End of Session Equipment Utilized During Treatment: Rolling walker  Activity Tolerance: Patient limited by fatigue Patient left: in bed;with call bell/phone within reach;with bed alarm set  OT Visit Diagnosis: Unsteadiness on feet (R26.81);Muscle weakness (generalized) (M62.81)                Time: [id number] OT Time Calculation (min): 24 min Charges:  OT General Charges $OT Visit: 1 Visit OT Evaluation $OT Eval Moderate Complexity: 1 Mod  Kari Baars, [place] [phone] Office- [phone], Edwena Felty D 08/15/2019, 5:46 PM

## 2019-08-15 NOTE — Evaluation (Signed)
Physical Therapy Evaluation Patient Details Name: Tonya Dougherty MRN: [id number] DOB: March 17, 1951 Today's Date: 08/15/2019   History of Present Illness  Pt s/p Lowe anterior resection with colorectal anastomosis 2* colon CA 08/14/19.  Pt with hx of obesity, MDD, panic attacks, and Psoriasis.`  Clinical Impression  Pt admitted as above and presenting with functional mobility limitations 2* generalized weakness, obesity, post op pain and mild ambulatory balance deficits.  Pt should progress to dc home with 24/7 assist of family/friends.    Follow Up Recommendations No PT follow up    Equipment Recommendations  None recommended by PT    Recommendations for Other Services       Precautions / Restrictions Precautions Precautions: Fall Restrictions Weight Bearing Restrictions: No      Mobility  Bed Mobility Overal bed mobility: Needs Assistance Bed Mobility: Supine to Sit;Sit to Supine     Supine to sit: Min assist Sit to supine: Min assist   General bed mobility comments: use of bed rails and cues to perform modified log roll  Transfers Overall transfer level: Needs assistance Equipment used: Rolling walker (2 wheeled) Transfers: Sit to/from Stand Sit to Stand: Min assist;From elevated surface         General transfer comment: cues for safe transition position and use of UEs to self assist  Ambulation/Gait Ambulation/Gait assistance: Min assist Gait Distance (Feet): 240 Feet Assistive device: Rolling walker (2 wheeled) Gait Pattern/deviations: Step-through pattern;Decreased step length - right;Decreased step length - left;Shuffle;Trunk flexed     General Gait Details: cues for posture and position from [employer]            Wheelchair Mobility    Modified Rankin (Stroke Patients Only)       Balance Overall balance assessment: Needs assistance Sitting-balance support: Feet supported;No upper extremity supported Sitting balance-Leahy Scale: Good      Standing balance support: Bilateral upper extremity supported Standing balance-Leahy Scale: Fair                               Pertinent Vitals/Pain Pain Assessment: Faces Faces Pain Scale: Hurts little more Pain Location: lower abdomen Pain Descriptors / Indicators: Aching;Sore Pain Intervention(s): Limited activity within patient's tolerance;Monitored during session;Premedicated before session;Ice applied    Home Living Family/patient expects to be discharged to:: Private residence Living Arrangements: Other relatives;Non-relatives/Friends Available Help at Discharge: Family;Available 24 hours/day Type of Home: House Home Access: Stairs to enter   [employer] of Steps: 1 Home Layout: Two level;Able to live on main level with bedroom/bathroom Home Equipment: Gilford Rile - 2 wheels;Walker - 4 wheels      Prior Function Level of Independence: Independent with assistive device(s)         Comments: used walker some if she went somewhere     Hand Dominance        Extremity/Trunk Assessment   Upper Extremity Assessment Upper Extremity Assessment: Generalized weakness    Lower Extremity Assessment Lower Extremity Assessment: Generalized weakness       Communication   Communication: No difficulties  Cognition Arousal/Alertness: Awake/alert Behavior During Therapy: WFL for tasks assessed/performed Overall Cognitive Status: Within Functional Limits for tasks assessed                                        General Comments      Exercises  Assessment/Plan    PT Assessment Patient needs continued PT services  PT Problem List Decreased strength;Decreased activity tolerance;Decreased balance;Decreased mobility;Decreased knowledge of use of DME;Obesity;Pain       PT Treatment Interventions DME instruction;Gait training;Stair training;Functional mobility training;Therapeutic activities;Therapeutic exercise;Balance  training;Patient/family education    PT Goals (Current goals can be found in the Care Plan section)  Acute Rehab PT Goals Patient Stated Goal: Regain IND PT Goal Formulation: With patient Time For Goal Achievement: 08/29/19 Potential to Achieve Goals: Good    Frequency Min 3X/week   Barriers to discharge        Co-evaluation PT/OT/SLP Co-Evaluation/Treatment: Yes Reason for Co-Treatment: For patient/therapist safety PT goals addressed during session: Mobility/safety with mobility OT goals addressed during session: ADL's and self-care       AM-PAC PT "6 Clicks" Mobility  Outcome Measure Help needed turning from your back to your side while in a flat bed without using bedrails?: A Little Help needed moving from lying on your back to sitting on the side of a flat bed without using bedrails?: A Little Help needed moving to and from a bed to a chair (including a wheelchair)?: A Little Help needed standing up from a chair using your arms (e.g., wheelchair or bedside chair)?: A Little Help needed to walk in hospital room?: A Little Help needed climbing 3-5 steps with a railing? : A Lot 6 Click Score: 17    End of Session Equipment Utilized During Treatment: Gait belt Activity Tolerance: Patient tolerated treatment well Patient left: in bed;with call bell/phone within reach;with bed alarm set Nurse Communication: Mobility status PT Visit Diagnosis: Difficulty in walking, not elsewhere classified (R26.2);Unsteadiness on feet (R26.81);Pain Pain - part of body: (abdomen)    Time: [id number] PT Time Calculation (min) (ACUTE ONLY): 23 min   Charges:   PT Evaluation $PT Eval Low Complexity: 1 Low          [place] Pager [phone] Office [phone]   Tonya Dougherty 08/15/2019, 5:01 PM

## 2019-08-15 NOTE — Progress Notes (Signed)
1 Day Post-Op Robotic assisted Sigmoidectomy Subjective: No complaints.  Tolerating clears.  OOB to chair this AM  Objective: Vital signs in last 24 hours: Temp:  [98 F (36.7 C)-98.6 F (37 C)] 98.6 F (37 C) (04/17 0820) Pulse Rate:  [62-75] 68 (04/17 0820) Resp:  [8-18] 16 (04/17 0820) BP: (102-186)/(52-95) 112/55 (04/17 0820) SpO2:  [93 %-100 %] 93 % (04/17 0820) Weight:  [111.9 kg] 111.9 kg (04/17 0226)   Intake/Output from previous day: 04/16 0701 - 04/17 0700 In: 2992.8 [P.O.:660; I.V.:1945.8; Blood:287; IV Piggyback:100] Out: 2500 [Urine:2350; Blood:150] Intake/Output this shift: No intake/output data recorded.   General appearance: alert and cooperative GI: normal findings: soft, non-tender  Incision: no significant drainage  Lab Results:  Recent Labs    08/15/19 0504  WBC 7.6  HGB 9.6*  HCT 33.2*  PLT 148*   BMET Recent Labs    08/15/19 0504  NA 137  K 4.1  CL 106  CO2 27  GLUCOSE 137*  BUN 16  CREATININE 0.75  CALCIUM 8.5*   PT/INR No results for input(s): LABPROT, INR in the last 72 hours. ABG No results for input(s): PHART, HCO3 in the last 72 hours.  Invalid input(s): PCO2, PO2  MEDS, Scheduled . acetaminophen  1,000 mg Oral Q6H  . alvimopan  12 mg Oral BID  . calcium carbonate  2 tablet Oral QHS  . carvedilol  3.125 mg Oral BID WC  . citalopram  30 mg Oral QHS  . feeding supplement (GLUCERNA SHAKE)  237 mL Oral BID BM  . ferrous sulfate  325 mg Oral Q breakfast  . gabapentin  300 mg Oral QHS  . heparin injection (subcutaneous)  5,000 Units Subcutaneous Q8H  . insulin aspart  0-15 Units Subcutaneous TID WC  . insulin aspart  0-5 Units Subcutaneous QHS  . pantoprazole  40 mg Oral Daily  . pneumococcal 23 valent vaccine  0.5 mL Intramuscular Tomorrow-1000  . spironolactone  50 mg Oral QPM    Studies/Results: No results found.  Assessment: s/p Procedure(s): XI ROBOT ASSISTED SIGMOIDECTOMY FLEXIBLE SIGMOIDOSCOPY Patient  Active Problem List   Diagnosis Date Noted  . 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 DVT (deep venous thrombosis) ([place]) 10/22/2017    Expected post op course  Plan: d/c foley Advance diet to fulls Ambulate in hall    LOS: 1 day     .Rosario Adie, [facility], [place]    08/15/2019 8:44 AM

## 2019-08-16 ENCOUNTER — Other Ambulatory Visit: Payer: Self-pay

## 2019-08-16 LAB — CBC
HCT: 33.4 % — ABNORMAL LOW (ref 36.0–46.0)
Hemoglobin: 9.5 g/dL — ABNORMAL LOW (ref 12.0–15.0)
MCH: 24.6 pg — ABNORMAL LOW (ref 26.0–34.0)
MCHC: 28.4 g/dL — ABNORMAL LOW (ref 30.0–36.0)
MCV: 86.5 fL (ref 80.0–100.0)
Platelets: 156 10*3/uL (ref 150–400)
RBC: 3.86 MIL/uL — ABNORMAL LOW (ref 3.87–5.11)
RDW: 19.4 % — ABNORMAL HIGH (ref 11.5–15.5)
WBC: 7.1 10*3/uL (ref 4.0–10.5)
nRBC: 0 % (ref 0.0–0.2)

## 2019-08-16 LAB — BASIC METABOLIC PANEL
Anion gap: 6 (ref 5–15)
BUN: 18 mg/dL (ref 8–23)
CO2: 27 mmol/L (ref 22–32)
Calcium: 8.8 mg/dL — ABNORMAL LOW (ref 8.9–10.3)
Chloride: 106 mmol/L (ref 98–111)
Creatinine, Ser: 0.88 mg/dL (ref 0.44–1.00)
GFR calc Af Amer: >60 mL/min (ref >60)
GFR calc non Af Amer: >60 mL/min (ref >60)
Glucose, Bld: 100 mg/dL — ABNORMAL HIGH (ref 70–99)
Potassium: 3.9 mmol/L (ref 3.5–5.1)
Sodium: 139 mmol/L (ref 135–145)

## 2019-08-16 LAB — GLUCOSE, CAPILLARY
Glucose-Capillary: 84 mg/dL (ref 70–99)
Glucose-Capillary: 125 mg/dL — ABNORMAL HIGH (ref 70–99)

## 2019-08-16 LAB — MAGNESIUM: Magnesium: 2 mg/dL (ref 1.7–2.4)

## 2019-08-16 NOTE — Discharge Summary (Signed)
Patient ID: Tonya Dougherty [id number] 69 y.o. 1951-02-12  08/14/2019  Discharge date and time: 08/16/2019  2:39 PM  Admitting Physician: Dr Dema Severin  Discharge Physician: Rosario Adie  Admission Diagnoses: Cancer of sigmoid colon [facility]) [C18.7]  Discharge Diagnoses: same  Operations: Procedure(s): XI ROBOT ASSISTED SIGMOIDECTOMY FLEXIBLE SIGMOIDOSCOPY    Discharged Condition: good    Hospital Course: Pt admitted after surgery.  Her diet was advanced as tolerated.  By POD 2 she was ambulating well and tolerating a diet.  She was having BM's and her pain was controlled with PO medications.  It was felt she was in good condition for discharge to home.    Consults: None  Significant Diagnostic Studies: labs: cbc, bmet  Treatments: IV hydration  Disposition: Home

## 2019-08-16 NOTE — Progress Notes (Signed)
Assessment unchanged. Pt verbalized understanding of dc instructions through teach back including medications, follow up care, and when to call the doctor. Discharged via wc to front entrance accompanied by NT.

## 2019-08-16 NOTE — Progress Notes (Signed)
Physical Therapy Treatment Patient Details Name: Tonya Dougherty MRN: [id number] DOB: July 03, 1950 Today's Date: 08/16/2019    History of Present Illness Pt s/p Lower anterior resection with colorectal anastomosis 2* colon CA 08/14/19.  Pt with hx of obesity, MDD, panic attacks, and Psoriasis.`    PT Comments    Pt in good spirits and progressing with mobility with noted improvement in stability with ambulation. Pt hopeful for dc home later today.Debe Coder PT Acute Rehabilitation Services Pager [phone] Office [phone]   Follow Up Recommendations  No PT follow up     Equipment Recommendations  None recommended by PT    Recommendations for Other Services       Precautions / Restrictions Precautions Precautions: Fall Restrictions Weight Bearing Restrictions: No    Mobility  Bed Mobility               General bed mobility comments: Pt up in chair and requests back to same  Transfers Overall transfer level: Needs assistance Equipment used: Rolling walker (2 wheeled) Transfers: Sit to/from Stand Sit to Stand: Min guard         General transfer comment: cues for safe transition position and use of UEs to self assist  Ambulation/Gait Ambulation/Gait assistance: Min assist;Min guard Gait Distance (Feet): 250 Feet Assistive device: Rolling walker (2 wheeled) Gait Pattern/deviations: Step-through pattern;Decreased step length - right;Decreased step length - left;Shuffle;Trunk flexed     General Gait Details: cues for posture and position from [employer]             Wheelchair Mobility    Modified Rankin (Stroke Patients Only)       Balance Overall balance assessment: Needs assistance Sitting-balance support: Feet supported;No upper extremity supported Sitting balance-Leahy Scale: Good     Standing balance support: Bilateral upper extremity supported Standing balance-Leahy Scale: Fair                               Cognition Arousal/Alertness: Awake/alert Behavior During Therapy: WFL for tasks assessed/performed Overall Cognitive Status: Within Functional Limits for tasks assessed                                        Exercises      General Comments        Pertinent Vitals/Pain Pain Assessment: No/denies pain Pain Score: 3  Pain Location: lower abdomen Pain Descriptors / Indicators: Aching;Sore Pain Intervention(s): Limited activity within patient's tolerance;Monitored during session;Ice applied    Home Living                      Prior Function            PT Goals (current goals can now be found in the care plan section) Acute Rehab PT Goals Patient Stated Goal: Regain IND PT Goal Formulation: With patient Time For Goal Achievement: 08/29/19 Potential to Achieve Goals: Good Progress towards PT goals: Progressing toward goals    Frequency    Min 3X/week      PT Plan Current plan remains appropriate    Co-evaluation              AM-PAC PT "6 Clicks" Mobility   Outcome Measure  Help needed turning from your back to your side while in a flat bed without using bedrails?: A Little Help needed moving  from lying on your back to sitting on the side of a flat bed without using bedrails?: A Little Help needed moving to and from a bed to a chair (including a wheelchair)?: A Little Help needed standing up from a chair using your arms (e.g., wheelchair or bedside chair)?: A Little Help needed to walk in hospital room?: A Little Help needed climbing 3-5 steps with a railing? : A Lot 6 Click Score: 17    End of Session Equipment Utilized During Treatment: Gait belt Activity Tolerance: Patient tolerated treatment well Patient left: in chair;with call bell/phone within reach;with chair alarm set Nurse Communication: Mobility status PT Visit Diagnosis: Difficulty in walking, not elsewhere classified (R26.2);Unsteadiness on feet (R26.81);Pain      Time: [id number] PT Time Calculation (min) (ACUTE ONLY): 13 min  Charges:  $Gait Training: 8-22 mins                     [place] Pager [phone] Office [phone]    [facility] 08/16/2019, 12:31 PM

## 2019-08-16 NOTE — Progress Notes (Signed)
Occupational Therapy Treatment Patient Details Name: Tonya Dougherty MRN: [id number] DOB: 12/14/1950 Today's Date: 08/16/2019    History of present illness Pt s/p Lower anterior resection with colorectal anastomosis 2* colon CA 08/14/19.  Pt with hx of obesity, MDD, panic attacks, and Psoriasis.`   OT comments  Pt hopes to go home this day.  Pt very motivated to get well!  Follow Up Recommendations  No OT follow up;Supervision/Assistance - 24 hour    Equipment Recommendations  None recommended by OT    Recommendations for Other Services      Precautions / Restrictions Precautions Precautions: Fall Restrictions Weight Bearing Restrictions: No       Mobility Bed Mobility   Bed Mobility: Sit to Supine       Sit to supine: Supervision   General bed mobility comments: Pt up in chair and requests back to same  Transfers Overall transfer level: Needs assistance Equipment used: Rolling walker (2 wheeled) Transfers: Sit to/from [facility] Sit to Stand: Supervision Stand pivot transfers: Supervision       General transfer comment: cues for safe transition position and use of UEs to self assist    Balance Overall balance assessment: Needs assistance Sitting-balance support: Feet supported;No upper extremity supported Sitting balance-Leahy Scale: Good     Standing balance support: Bilateral upper extremity supported Standing balance-Leahy Scale: Fair                             ADL either performed or assessed with clinical judgement   ADL Overall ADL's : Needs assistance/impaired Eating/Feeding: Set up;Sitting   Grooming: Set up;Sitting   Upper Body Bathing: Set up;Sitting   Lower Body Bathing: Sit to/from stand;Set up   Upper Body Dressing : Set up;Sitting   Lower Body Dressing: Sit to/from stand;Cueing for safety;Set up   Toilet Transfer: Ambulation;Cueing for safety;Cueing for sequencing;RW;Set up   Toileting- Clothing  Manipulation and Hygiene: Sit to/from stand;Set up       Functional mobility during ADLs: Rolling walker;Set up       Vision Patient Visual Report: No change from baseline     Perception     Praxis      Cognition Arousal/Alertness: Awake/alert Behavior During Therapy: WFL for tasks assessed/performed Overall Cognitive Status: Within Functional Limits for tasks assessed                                                     Pertinent Vitals/ Pain       Pain Assessment: No/denies pain Pain Score: 3  Pain Location: lower abdomen Pain Descriptors / Indicators: Aching;Sore Pain Intervention(s): Limited activity within patient's tolerance;Monitored during session;Ice applied         Frequency  Min 2X/week        Progress Toward Goals  OT Goals(current goals can now be found in the care plan section)  Progress towards OT goals: Progressing toward goals  Acute Rehab OT Goals Patient Stated Goal: [place] Discharge plan remains appropriate       AM-PAC OT "6 Clicks" Daily Activity     Outcome Measure   Help from another person eating meals?: None Help from another person taking care of personal grooming?: None Help from another person toileting, which includes using toliet, bedpan, or urinal?: A  Little Help from another person bathing (including washing, rinsing, drying)?: A Little Help from another person to put on and taking off regular upper body clothing?: None Help from another person to put on and taking off regular lower body clothing?: A Little 6 Click Score: 21    End of Session Equipment Utilized During Treatment: Rolling walker  OT Visit Diagnosis: Unsteadiness on feet (R26.81);Muscle weakness (generalized) (M62.81)   Activity Tolerance Patient tolerated treatment well   Patient Left in bed;with call bell/phone within reach;with bed alarm set   Nurse Communication Mobility status        Time: [id number] OT Time  Calculation (min): 13 min  Charges: OT General Charges $OT Visit: 1 Visit OT Treatments $Self Care/Home Management : 8-22 mins  Kari Baars, [place] [phone] Office- [phone]      Neena Beecham, Edwena Felty D 08/16/2019, 2:17 PM

## 2019-08-17 LAB — TYPE AND SCREEN
ABO/RH(D): A POS
Antibody Screen: NEGATIVE
Unit division: 0
Unit division: 0

## 2019-08-17 LAB — BPAM RBC
Blood Product Expiration Date: 202104282359
Blood Product Expiration Date: 202105042359
ISSUE DATE / TIME: 202104161337
ISSUE DATE / TIME: 202104161337
Unit Type and Rh: 6200
Unit Type and Rh: 6200

## 2019-08-19 LAB — SURGICAL PATHOLOGY

## 2019-08-21 ENCOUNTER — Encounter (HOSPITAL_COMMUNITY): Payer: Self-pay

## 2019-08-26 ENCOUNTER — Other Ambulatory Visit: Payer: Self-pay

## 2019-09-11 ENCOUNTER — Telehealth: Payer: Self-pay | Admitting: Internal Medicine

## 2019-09-11 NOTE — Telephone Encounter (Signed)
Left message for pt to call back  °

## 2019-09-14 NOTE — Telephone Encounter (Signed)
Discussed with pt that she is fine to get the covid vaccine.

## 2019-10-05 ENCOUNTER — Ambulatory Visit: Payer: Medicare HMO | Admitting: Internal Medicine

## 2019-11-25 ENCOUNTER — Ambulatory Visit: Payer: Medicare HMO | Admitting: Internal Medicine

## 2019-11-25 ENCOUNTER — Encounter (HOSPITAL_COMMUNITY): Payer: Self-pay

## 2019-11-25 ENCOUNTER — Ambulatory Visit (HOSPITAL_COMMUNITY)
Admission: EM | Admit: 2019-11-25 | Discharge: 2019-11-25 | Disposition: A | Discharge status: Home / Self Care | Payer: Medicare HMO

## 2019-11-25 DIAGNOSIS — M79604 Pain in right leg: Secondary | ICD-10-CM

## 2019-11-25 DIAGNOSIS — E119 Type 2 diabetes mellitus without complications: Secondary | ICD-10-CM

## 2019-11-25 DIAGNOSIS — M5431 Sciatica, right side: Secondary | ICD-10-CM

## 2019-11-25 MED ORDER — TIZANIDINE HCL 4 MG PO TABS: 4.0000 mg | ORAL_TABLET | Freq: Every day | ORAL | 0 refills | Status: DC

## 2019-11-25 MED ORDER — PREDNISONE 20 MG PO TABS: ORAL_TABLET | ORAL | 0 refills | Status: DC

## 2019-11-25 NOTE — ED Provider Notes (Signed)
[facility]   MRN: [id number] DOB: [birth year]  Subjective:   Tonya Dougherty is a 69 y.o. female presenting for 2-week history of worsening right-sided buttock pain that radiates down her leg.  Patient states about 2 months ago she felt like she pulled her hamstring as she was trying to hang up something on her walls.  She had minimal pain but symptoms did not progress until she had this particular episode of pain.  She does have a history remotely of sciatica when she was younger in her 25s.  Has been using ibuprofen consistently with minimal relief of her pain.  Denies falls, trauma, weakness, back pain, incontinence.  Patient has a history of cancer of the sigmoid colon, status post surgical removal.  She also has a history of DVT.  History of diabetes, well controlled, blood sugars are less than 150 consistently on her morning checks.  Patient follows a very strict diabetic diet.  No current facility-administered medications for this encounter.  Current Outpatient Medications:  .  acetaminophen (TYLENOL) 500 MG tablet, Take 1,000 mg by mouth every 6 (six) hours as needed for mild pain. , Disp: , Rfl:  .  albuterol (VENTOLIN HFA) 108 (90 Base) MCG/ACT inhaler, Inhale 2 puffs into the lungs every 6 (six) hours as needed for wheezing or shortness of breath. , Disp: , Rfl:  .  apixaban (ELIQUIS) 5 MG TABS tablet, Take 5 mg by mouth 2 (two) times daily., Disp: , Rfl:  .  B-D ULTRAFINE III SHORT PEN 31G X 8 MM MISC, USE AS DIRECTED TO INJECT LANTUS DAILY, Disp: , Rfl: 3 .  Calcium Carb-Cholecalciferol (CALCIUM 600+D) 600-800 MG-UNIT TABS, Take 2 tablets by mouth at bedtime., Disp: , Rfl:  .  carboxymethylcellulose (REFRESH PLUS) 0.5 % SOLN, Place 1 drop into both eyes 3 (three) times daily as needed (dry/irritated eyes)., Disp: , Rfl:  .  carvedilol (COREG) 3.125 MG tablet, Take 3.125 mg by mouth 2 (two) times daily with a meal., Disp: , Rfl:  .  citalopram (CELEXA) 10 MG tablet, Take 30 mg  by mouth at bedtime. , Disp: , Rfl:  .  diclofenac Sodium (VOLTAREN) 1 % GEL, Apply 1 application topically 4 (four) times daily as needed (pain.). , Disp: , Rfl:  .  furosemide (LASIX) 20 MG tablet, Take 20 mg by mouth every evening. , Disp: , Rfl:  .  gabapentin (NEURONTIN) 300 MG capsule, Take 600 mg by mouth at bedtime., Disp: , Rfl:  .  Insulin Glargine (BASAGLAR KWIKPEN) 100 UNIT/ML SOPN, Inject 40 Units into the skin at bedtime., Disp: , Rfl:  .  lidocaine (LIDODERM) 5 %, Place 1 patch onto the skin at bedtime. Apply to affected area for 12 hours only each day (then remove patch), Disp: , Rfl:  .  Magnesium 100 MG TABS, Take 100 mg by mouth at bedtime. Magnesium Citrate, Disp: , Rfl:  .  Melatonin 5 MG CAPS, Take 15 mg by mouth at bedtime. , Disp: , Rfl:  .  omeprazole (PRILOSEC) 20 MG capsule, Take 20 mg by mouth at bedtime., Disp: , Rfl:  .  spironolactone (ALDACTONE) 50 MG tablet, Take 50 mg by mouth every evening. , Disp: , Rfl:  .  cephALEXin (KEFLEX) 500 MG capsule, Take 1 capsule (500 mg total) by mouth 4 (four) times daily. (Patient not taking: Reported on 07/29/2019), Disp: 28 capsule, Rfl: 0 .  esomeprazole (NEXIUM) 20 MG capsule, Take by mouth., Disp: , Rfl:  .  ferrous  sulfate 325 (65 FE) MG tablet, Take 325 mg by mouth daily with breakfast., Disp: , Rfl:  .  fluticasone (FLONASE) 50 MCG/ACT nasal spray, Place 1-2 sprays into both nostrils daily as needed (allergies.). , Disp: , Rfl:  .  Hydrocortisone, Perianal, 1 % CREA, Apply 1 application topically 2 (two) times daily as needed (irritation). , Disp: , Rfl:  .  permethrin (ELIMITE) 5 % cream, Apply 1 application topically daily as needed (skin irritation.). , Disp: , Rfl:  .  polyethylene glycol powder (GLYCOLAX/MIRALAX) 17 GM/SCOOP powder, Take 17 g by mouth daily as needed (constipation.). , Disp: , Rfl:  .  silver sulfADIAZINE (SILVADENE) 1 % cream, Apply 1 application topically daily. (Patient taking differently: Apply 1  application topically daily as needed (skin irritation.). ), Disp: 50 g, Rfl: 0   Allergies  Allergen Reactions  . Xarelto [Rivaroxaban] Rash    Severe rash with itching  . Erythromycin     Oral Thrush  . Lipitor [Atorvastatin]     Leg cramps  . Pravachol [Pravastatin]     Leg muscle cramps  . Ivp Dye [Iodinated Diagnostic Agents] Rash    Past Medical History:  Diagnosis Date  . Allergic rhinitis   . Anxiety   . Arthritis   . Bell's palsy   . Cataract   . Cirrhosis ([place])   . Coarse tremors   . Colon cancer ([place])   . Diabetes mellitus without complication ([place])   . Diverticulosis   . DVT (deep venous thrombosis) ([place])   . GERD (gastroesophageal reflux disease)   . GI bleed   . Hepatitis B   . History of blood transfusion 05/2019  . History of panic attacks   . Hypertension   . Insomnia   . Iron deficiency anemia   . MDD (major depressive disorder)   . Morbid obesity ([place])   . Neuropathy   . Portal vein thrombosis   . Psoriasis   . Renal insufficiency      Past Surgical History:  Procedure Laterality Date  . ANKLE SURGERY Right   . CATARACT EXTRACTION, BILATERAL    . COLONOSCOPY WITH ESOPHAGOGASTRODUODENOSCOPY (EGD)  05/2019  . FLEXIBLE SIGMOIDOSCOPY N/A 08/14/2019   Procedure: FLEXIBLE SIGMOIDOSCOPY;  Surgeon: Ileana Roup, MD;  Location: [facility];  Service: General;  Laterality: N/A;    Family History  Problem Relation Age of Onset  . Kidney failure Mother   . Diabetes Mother   . Obesity Mother   . Hypertension Mother   . Cirrhosis Father   . Hypertension Sister   . Kidney failure Brother   . Hypertension Brother   . Hypertension Sister   . Alcoholism Brother   . Other Brother        MRSA  . Cirrhosis Daughter   . Drug abuse Daughter     Social History   Tobacco Use  . Smoking status: Current Some Day Smoker    Packs/day: 0.50    Types: Cigarettes  . Smokeless tobacco: Never Used  Vaping Use  . Vaping Use: Some days  . Substances:  Nicotine, Flavoring  Substance Use Topics  . Alcohol use: No  . Drug use: Not Currently    ROS   Objective:   Vitals: BP 126/67 (BP Location: Left Arm)   Pulse 79   Temp 98.4 F (36.9 C) (Oral)   Resp 18   SpO2 100%   Physical Exam Constitutional:      General: She is not in acute  distress.    Appearance: Normal appearance. She is well-developed. She is obese. She is not ill-appearing, toxic-appearing or diaphoretic.  HENT:     Head: Normocephalic and atraumatic.     Nose: Nose normal.     Mouth/Throat:     Mouth: Mucous membranes are moist.     Pharynx: Oropharynx is clear.  Eyes:     General: No scleral icterus.    Extraocular Movements: Extraocular movements intact.     Pupils: Pupils are equal, round, and reactive to light.  Cardiovascular:     Rate and Rhythm: Normal rate.  Pulmonary:     Effort: Pulmonary effort is normal.  Musculoskeletal:     Lumbar back: Tenderness (over area outlined) present. No swelling, edema, deformity, signs of trauma, lacerations, spasms or bony tenderness. Normal range of motion. Positive right straight leg raise test. Negative left straight leg raise test. No scoliosis.       Back:  Skin:    General: Skin is warm and dry.  Neurological:     General: No focal deficit present.     Mental Status: She is alert and oriented to person, place, and time.     Motor: No weakness.     Coordination: Coordination normal.     Gait: Gait normal.     Deep Tendon Reflexes: Reflexes normal.  Psychiatric:        Mood and Affect: Mood normal.        Behavior: Behavior normal.        Thought Content: Thought content normal.        Judgment: Judgment normal.      Assessment and Plan :   PDMP not reviewed this encounter.  1. Right leg pain   2. Sciatica of right side   3. Well controlled diabetes mellitus ([place])     Emphasized need to stop taking NSAIDs.  Recommended strict diabetic diet.  Will use trial of prednisone, tizanidine.   Recommended follow-up with back specialist if her symptoms persist.  Deferred imaging given lack of trauma, physical exam findings warranting imaging. Counseled patient on potential for adverse effects with medications prescribed/recommended today, ER and return-to-clinic precautions discussed, patient verbalized understanding.    Jaynee Eagles, PA-C 11/25/19 1348

## 2019-11-25 NOTE — ED Triage Notes (Signed)
Pt reports pain starting at bottom of L buttocks radiating down posterior leg to knee. Worsening x 2 weeks.  No known injury.  Pt reports some relief with a compression bandage that was wrapped around leg.  Reports difficulty sleeping. Only relief is sitting on commode - feels the pressure on the leg helps the pain for about 20 minutes.

## 2019-11-25 NOTE — Discharge Instructions (Signed)
Watch your blood sugar so that it does not go above 300s. Prednisone as a steroid can increase your blood sugar. Take tizanidine at bedtime as it is a muscle relaxant and can make you drowsy.

## 2020-03-10 ENCOUNTER — Other Ambulatory Visit: Payer: Self-pay | Admitting: Vascular Surgery

## 2020-03-24 ENCOUNTER — Emergency Department (HOSPITAL_COMMUNITY): Payer: Medicare HMO

## 2020-03-24 ENCOUNTER — Inpatient Hospital Stay (HOSPITAL_COMMUNITY)
Admission: EM | Admit: 2020-03-24 | Discharge: 2020-04-14 | DRG: 478 | Disposition: A | Discharge status: Skilled Nursing Facility | Payer: Medicare HMO | Attending: Internal Medicine | Admitting: Internal Medicine

## 2020-03-24 ENCOUNTER — Other Ambulatory Visit: Payer: Self-pay

## 2020-03-24 DIAGNOSIS — K219 Gastro-esophageal reflux disease without esophagitis: Secondary | ICD-10-CM | POA: Diagnosis present

## 2020-03-24 DIAGNOSIS — D696 Thrombocytopenia, unspecified: Secondary | ICD-10-CM | POA: Diagnosis present

## 2020-03-24 DIAGNOSIS — Z888 Allergy status to other drugs, medicaments and biological substances status: Secondary | ICD-10-CM

## 2020-03-24 DIAGNOSIS — L409 Psoriasis, unspecified: Secondary | ICD-10-CM | POA: Diagnosis present

## 2020-03-24 DIAGNOSIS — R531 Weakness: Secondary | ICD-10-CM | POA: Diagnosis not present

## 2020-03-24 DIAGNOSIS — E782 Mixed hyperlipidemia: Secondary | ICD-10-CM | POA: Diagnosis present

## 2020-03-24 DIAGNOSIS — E1140 Type 2 diabetes mellitus with diabetic neuropathy, unspecified: Secondary | ICD-10-CM | POA: Diagnosis present

## 2020-03-24 DIAGNOSIS — Z6841 Body Mass Index (BMI) 40.0 and over, adult: Secondary | ICD-10-CM

## 2020-03-24 DIAGNOSIS — Z7901 Long term (current) use of anticoagulants: Secondary | ICD-10-CM

## 2020-03-24 DIAGNOSIS — G250 Essential tremor: Secondary | ICD-10-CM | POA: Diagnosis present

## 2020-03-24 DIAGNOSIS — Z794 Long term (current) use of insulin: Secondary | ICD-10-CM

## 2020-03-24 DIAGNOSIS — Z841 Family history of disorders of kidney and ureter: Secondary | ICD-10-CM

## 2020-03-24 DIAGNOSIS — Z8719 Personal history of other diseases of the digestive system: Secondary | ICD-10-CM

## 2020-03-24 DIAGNOSIS — K5900 Constipation, unspecified: Secondary | ICD-10-CM

## 2020-03-24 DIAGNOSIS — Z9049 Acquired absence of other specified parts of digestive tract: Secondary | ICD-10-CM

## 2020-03-24 DIAGNOSIS — Z8249 Family history of ischemic heart disease and other diseases of the circulatory system: Secondary | ICD-10-CM

## 2020-03-24 DIAGNOSIS — Z8619 Personal history of other infectious and parasitic diseases: Secondary | ICD-10-CM

## 2020-03-24 DIAGNOSIS — L8990 Pressure ulcer of unspecified site, unspecified stage: Secondary | ICD-10-CM | POA: Insufficient documentation

## 2020-03-24 DIAGNOSIS — K5903 Drug induced constipation: Secondary | ICD-10-CM | POA: Diagnosis present

## 2020-03-24 DIAGNOSIS — Z833 Family history of diabetes mellitus: Secondary | ICD-10-CM

## 2020-03-24 DIAGNOSIS — C7951 Secondary malignant neoplasm of bone: Principal | ICD-10-CM | POA: Diagnosis present

## 2020-03-24 DIAGNOSIS — Y92230 Patient room in hospital as the place of occurrence of the external cause: Secondary | ICD-10-CM | POA: Diagnosis not present

## 2020-03-24 DIAGNOSIS — Z86718 Personal history of other venous thrombosis and embolism: Secondary | ICD-10-CM

## 2020-03-24 DIAGNOSIS — B9620 Unspecified Escherichia coli [E. coli] as the cause of diseases classified elsewhere: Secondary | ICD-10-CM | POA: Diagnosis present

## 2020-03-24 DIAGNOSIS — N2889 Other specified disorders of kidney and ureter: Secondary | ICD-10-CM | POA: Diagnosis present

## 2020-03-24 DIAGNOSIS — I129 Hypertensive chronic kidney disease with stage 1 through stage 4 chronic kidney disease, or unspecified chronic kidney disease: Secondary | ICD-10-CM | POA: Diagnosis present

## 2020-03-24 DIAGNOSIS — K760 Fatty (change of) liver, not elsewhere classified: Secondary | ICD-10-CM | POA: Diagnosis present

## 2020-03-24 DIAGNOSIS — N189 Chronic kidney disease, unspecified: Secondary | ICD-10-CM | POA: Diagnosis present

## 2020-03-24 DIAGNOSIS — E1165 Type 2 diabetes mellitus with hyperglycemia: Secondary | ICD-10-CM | POA: Diagnosis present

## 2020-03-24 DIAGNOSIS — Z881 Allergy status to other antibiotic agents status: Secondary | ICD-10-CM

## 2020-03-24 DIAGNOSIS — M48061 Spinal stenosis, lumbar region without neurogenic claudication: Secondary | ICD-10-CM | POA: Diagnosis present

## 2020-03-24 DIAGNOSIS — Z91041 Radiographic dye allergy status: Secondary | ICD-10-CM

## 2020-03-24 DIAGNOSIS — G5700 Lesion of sciatic nerve, unspecified lower limb: Secondary | ICD-10-CM | POA: Diagnosis present

## 2020-03-24 DIAGNOSIS — B372 Candidiasis of skin and nail: Secondary | ICD-10-CM | POA: Diagnosis present

## 2020-03-24 DIAGNOSIS — F410 Panic disorder [episodic paroxysmal anxiety] without agoraphobia: Secondary | ICD-10-CM | POA: Diagnosis present

## 2020-03-24 DIAGNOSIS — D509 Iron deficiency anemia, unspecified: Secondary | ICD-10-CM

## 2020-03-24 DIAGNOSIS — F32A Depression, unspecified: Secondary | ICD-10-CM | POA: Diagnosis present

## 2020-03-24 DIAGNOSIS — E1122 Type 2 diabetes mellitus with diabetic chronic kidney disease: Secondary | ICD-10-CM | POA: Diagnosis present

## 2020-03-24 DIAGNOSIS — Z79899 Other long term (current) drug therapy: Secondary | ICD-10-CM

## 2020-03-24 DIAGNOSIS — M533 Sacrococcygeal disorders, not elsewhere classified: Secondary | ICD-10-CM | POA: Diagnosis present

## 2020-03-24 DIAGNOSIS — C187 Malignant neoplasm of sigmoid colon: Secondary | ICD-10-CM | POA: Diagnosis present

## 2020-03-24 DIAGNOSIS — F17210 Nicotine dependence, cigarettes, uncomplicated: Secondary | ICD-10-CM | POA: Diagnosis present

## 2020-03-24 DIAGNOSIS — R161 Splenomegaly, not elsewhere classified: Secondary | ICD-10-CM | POA: Diagnosis present

## 2020-03-24 DIAGNOSIS — I10 Essential (primary) hypertension: Secondary | ICD-10-CM | POA: Diagnosis present

## 2020-03-24 DIAGNOSIS — E1169 Type 2 diabetes mellitus with other specified complication: Secondary | ICD-10-CM | POA: Diagnosis present

## 2020-03-24 DIAGNOSIS — R159 Full incontinence of feces: Secondary | ICD-10-CM | POA: Diagnosis present

## 2020-03-24 DIAGNOSIS — Z20822 Contact with and (suspected) exposure to covid-19: Secondary | ICD-10-CM | POA: Diagnosis present

## 2020-03-24 DIAGNOSIS — R32 Unspecified urinary incontinence: Secondary | ICD-10-CM | POA: Diagnosis present

## 2020-03-24 DIAGNOSIS — L89152 Pressure ulcer of sacral region, stage 2: Secondary | ICD-10-CM | POA: Diagnosis present

## 2020-03-24 DIAGNOSIS — N3000 Acute cystitis without hematuria: Secondary | ICD-10-CM | POA: Diagnosis present

## 2020-03-24 DIAGNOSIS — C801 Malignant (primary) neoplasm, unspecified: Secondary | ICD-10-CM

## 2020-03-24 DIAGNOSIS — W1839XA Other fall on same level, initial encounter: Secondary | ICD-10-CM | POA: Diagnosis not present

## 2020-03-24 LAB — CBC WITH DIFFERENTIAL/PLATELET
Abs Immature Granulocytes: 0.05 10*3/uL (ref 0.00–0.07)
Basophils Absolute: 0 10*3/uL (ref 0.0–0.1)
Basophils Relative: 0 %
Eosinophils Absolute: 0.1 10*3/uL (ref 0.0–0.5)
Eosinophils Relative: 1 %
HCT: 42.4 % (ref 36.0–46.0)
Hemoglobin: 13 g/dL (ref 12.0–15.0)
Immature Granulocytes: 1 %
Lymphocytes Relative: 23 %
Lymphs Abs: 1.9 10*3/uL (ref 0.7–4.0)
MCH: 29 pg (ref 26.0–34.0)
MCHC: 30.7 g/dL (ref 30.0–36.0)
MCV: 94.6 fL (ref 80.0–100.0)
Monocytes Absolute: 0.7 10*3/uL (ref 0.1–1.0)
Monocytes Relative: 8 %
Neutro Abs: 5.6 10*3/uL (ref 1.7–7.7)
Neutrophils Relative %: 67 %
Platelets: 183 10*3/uL (ref 150–400)
RBC: 4.48 MIL/uL (ref 3.87–5.11)
RDW: 19.9 % — ABNORMAL HIGH (ref 11.5–15.5)
WBC: 8.4 10*3/uL (ref 4.0–10.5)
nRBC: 0 % (ref 0.0–0.2)

## 2020-03-24 LAB — COMPREHENSIVE METABOLIC PANEL
ALT: 14 U/L (ref 0–44)
AST: 23 U/L (ref 15–41)
Albumin: 2.9 g/dL — ABNORMAL LOW (ref 3.5–5.0)
Alkaline Phosphatase: 130 U/L — ABNORMAL HIGH (ref 38–126)
Anion gap: 12 (ref 5–15)
BUN: 27 mg/dL — ABNORMAL HIGH (ref 8–23)
CO2: 22 mmol/L (ref 22–32)
Calcium: 9.2 mg/dL (ref 8.9–10.3)
Chloride: 106 mmol/L (ref 98–111)
Creatinine, Ser: 1.01 mg/dL — ABNORMAL HIGH (ref 0.44–1.00)
GFR, Estimated: >60 mL/min (ref >60)
Glucose, Bld: 171 mg/dL — ABNORMAL HIGH (ref 70–99)
Potassium: 4.1 mmol/L (ref 3.5–5.1)
Sodium: 140 mmol/L (ref 135–145)
Total Bilirubin: 0.8 mg/dL (ref 0.3–1.2)
Total Protein: 6.9 g/dL (ref 6.5–8.1)

## 2020-03-24 LAB — TYPE AND SCREEN
ABO/RH(D): A POS
Antibody Screen: NEGATIVE

## 2020-03-24 LAB — URINALYSIS, ROUTINE W REFLEX MICROSCOPIC
Bilirubin Urine: NEGATIVE
Glucose, UA: NEGATIVE mg/dL
Ketones, ur: NEGATIVE mg/dL
Nitrite: POSITIVE — AB
Protein, ur: 30 mg/dL — AB
Specific Gravity, Urine: 1.018 (ref 1.005–1.030)
WBC, UA: >50 WBC/hpf — ABNORMAL HIGH (ref 0–5)
pH: 5 (ref 5.0–8.0)

## 2020-03-24 LAB — POC OCCULT BLOOD, ED: Fecal Occult Bld: POSITIVE — AB

## 2020-03-24 IMAGING — MR MR THORACIC SPINE W/O CM
4 of 11 series · 18 of 48 positions shown · non-contrast
Comparison: Prior CT from [DATE]

CLINICAL DATA: Initial evaluation for low back pain, cauda equina
syndrome suspected.

EXAM:
MRI THORACIC AND LUMBAR SPINE WITHOUT CONTRAST
TECHNIQUE: Multiplanar and multiecho pulse sequences of the thoracic and lumbar
spine were obtained without intravenous contrast.

[Series 16: T1 · sagittal · 3.0mm · 0.62mm/px · 3 of 9 slices shown (1 of 2)]
[im 1/9]
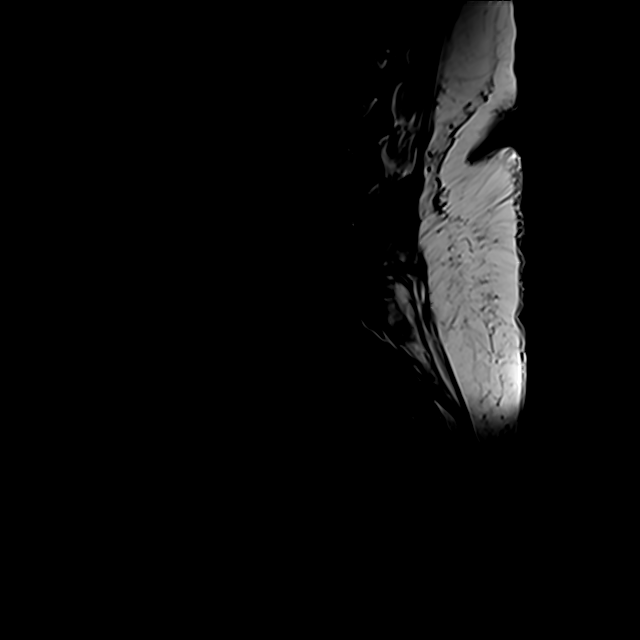
[im 5/9]
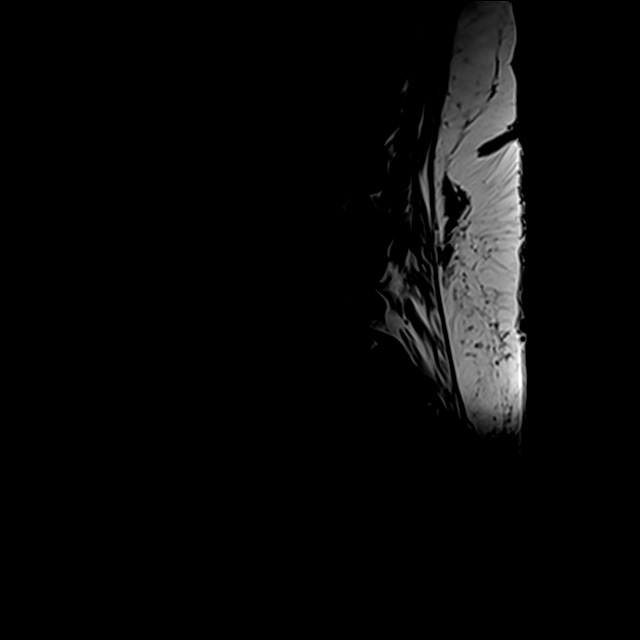
[im 9/9]
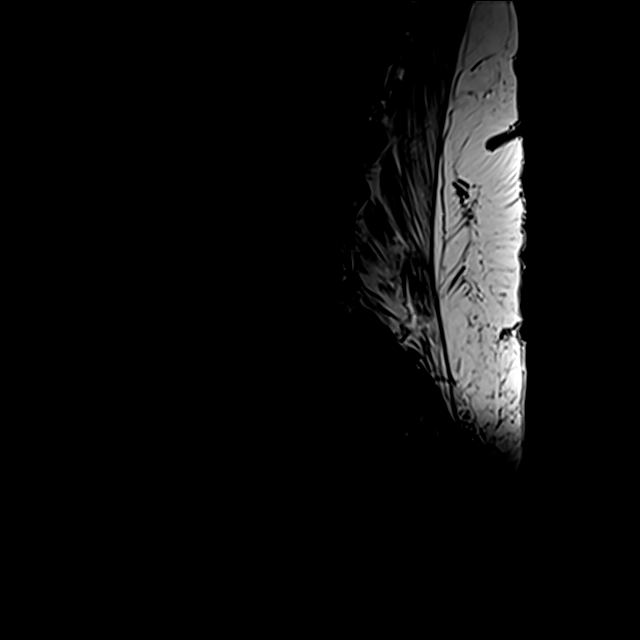

[Series 17: T1 · sagittal · 3.0mm · 0.62mm/px · 2 of 9 slices shown (2 of 2)]
[im 1/9]
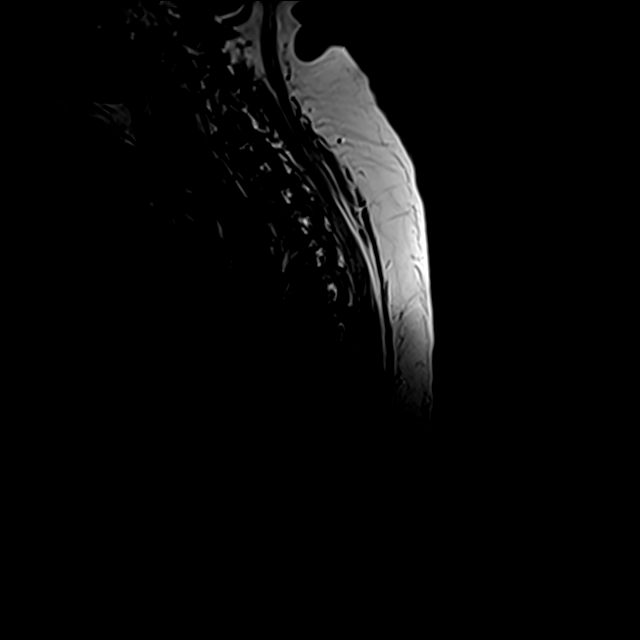
[im 9/9]
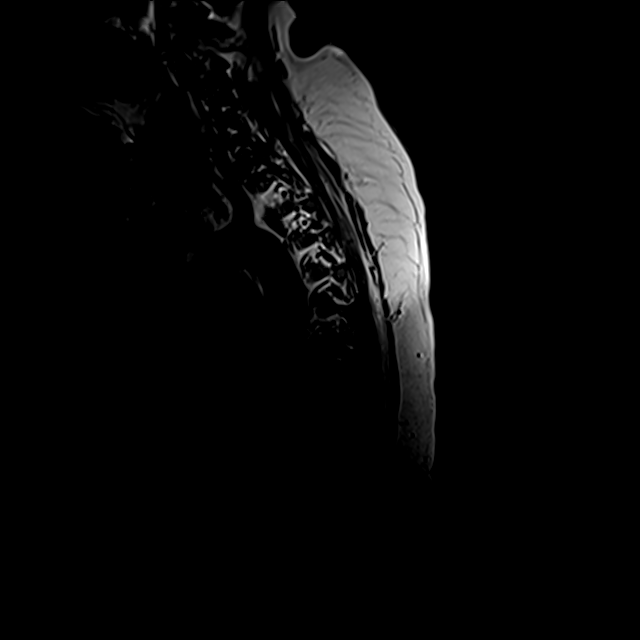

[Series 22: T2 · sagittal · 3.0mm · 0.76mm/px · 5 of 18 slices shown (1 of 2)]
[im 1/18]
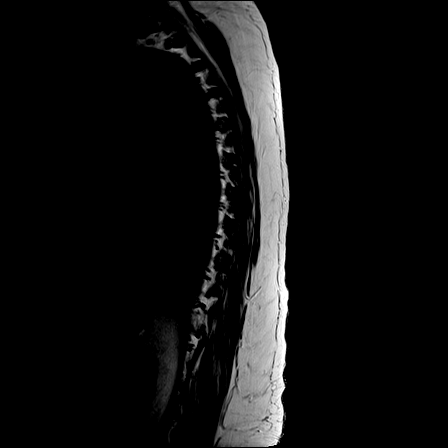
[im 5/18]
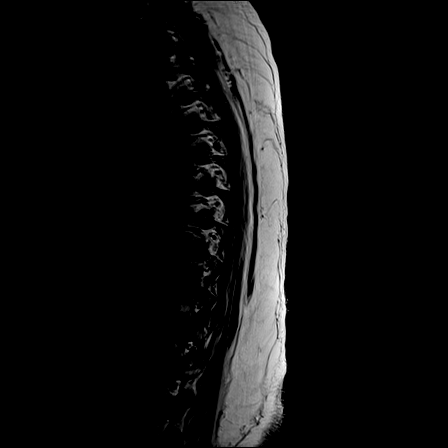
[im 9/18]
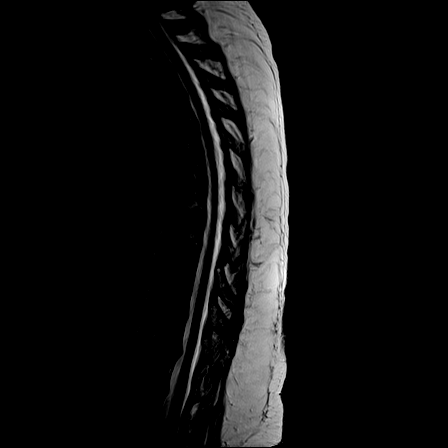
[im 13/18]
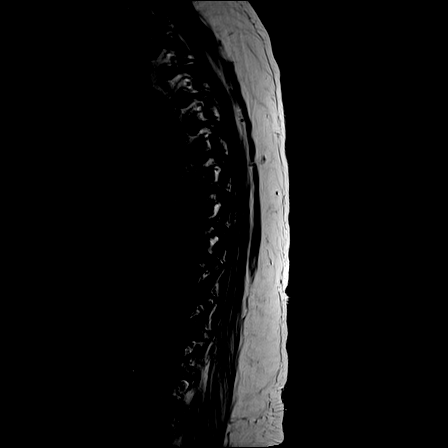
[im 18/18]
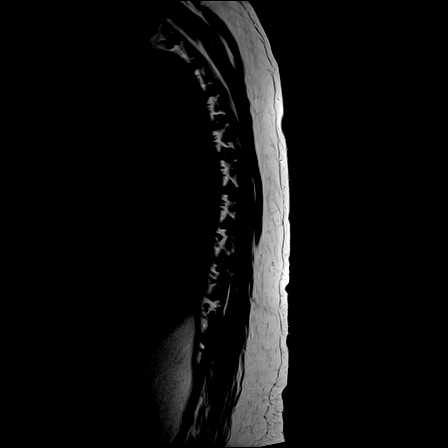

[Series 25: T2 · axial · 5.0mm · 0.59mm/px · z∈[-254,-26]mm · 8 of 39 slices shown (2 of 2)]
[im 1/39]
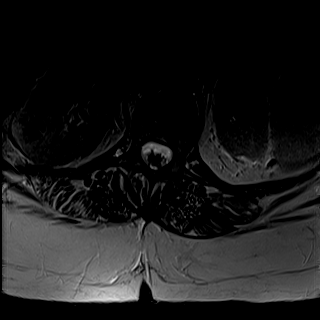
[im 5/39]
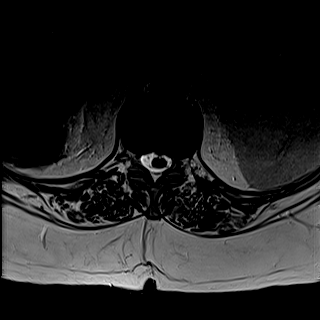
[im 13/39]
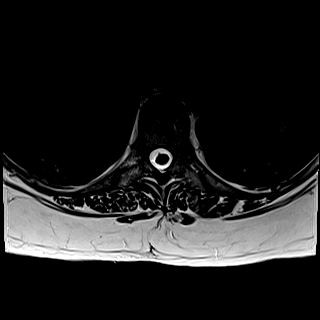
[im 17/39]
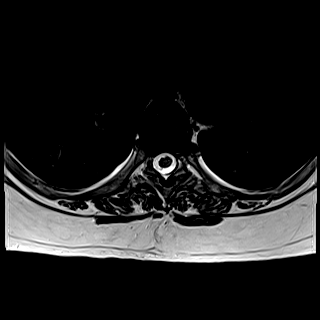
[im 22/39]
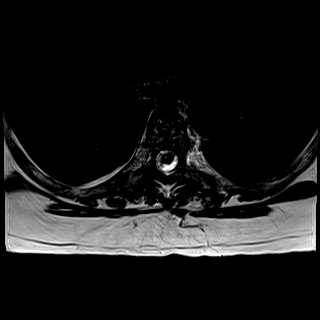
[im 26/39]
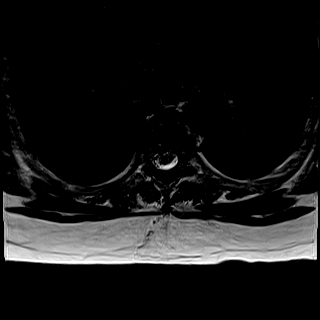
[im 34/39]
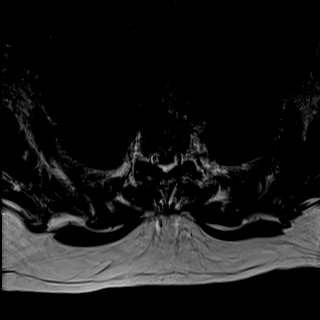
[im 39/39]
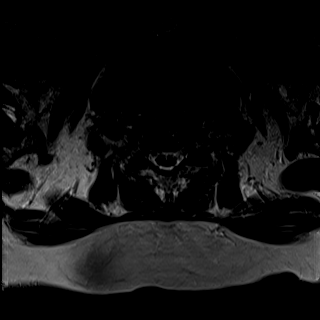

[18 of 48 positions shown; findings below may reference images not displayed]

FINDINGS: MRI THORACIC SPINE FINDINGS

Alignment:  Examination degraded by motion artifact.

Vertebral bodies normally aligned with preservation of the normal
thoracic kyphosis. No listhesis or static subluxation.

Vertebrae: Vertebral body height maintained without acute or chronic
fracture. Bone marrow signal intensity mildly heterogeneous without
worrisome osseous lesion. No evidence for metastatic disease within
the thoracic spine. Subcentimeter benign hemangioma noted within the
T10 vertebral body.

Cord:  Normal signal and morphology on this motion degraded exam.

Paraspinal and other soft tissues: Paraspinous soft tissues
demonstrate no acute finding. Probable scattered atelectatic changes
noted within the partially visualized lungs. Visualized visceral
structures otherwise grossly unremarkable.

Disc levels:

Normal expected for age multilevel disc desiccation with mild
annular disc bulging, most notable at T4-5, T10-11, and T11-12. No
significant spinal stenosis or evidence for cord deformity or
impingement. Foramina remain patent. No neural impingement.

MRI LUMBAR SPINE FINDINGS

Segmentation:  Examination moderately degraded by motion artifact.

Standard segmentation. Lowest well-formed disc space labeled the
L5-S1 level.

Alignment: Levoscoliosis. Trace 2 mm retrolisthesis of L3 on L4,
with trace 2 mm anterolisthesis of L4 on L5, chronic and facet
mediated. Alignment otherwise normal with preservation of the normal
lumbar lordosis.

Vertebrae: There is an abnormal T2/STIR hyperintense lesion
involving the central and left aspect of the sacrum, concerning for
osseous metastatic disease (series 2, image 9). This appears to be
new as compared to most recent available CT from [DATE].
Probable early invasion of the adjacent left S1 and possibly S2
neural foramina, partially visualized (series 5, image 26). Possible
early invasion of the right S1 neural foramen noted as well.
Probable extraosseous extension into the adjacent presacral space
noted on sagittal view (series 2, image 13). No visible pathologic
fracture.

No other worrisome osseous lesions or evidence for metastatic
disease seen within the lumbar spine. Underlying bone marrow signal
intensity somewhat diffusely heterogeneous. Vertebral body height
maintained without acute or chronic fracture. No other abnormal
marrow edema.

Conus medullaris and cauda equina: Conus extends to the L1-2 level.
Conus and cauda equina appear normal.

Paraspinal and other soft tissues: Paraspinous soft tissues
demonstrate no acute finding on this motion degraded exam. There is
question of a heterogeneous lesion within the interpolar right
kidney measuring 2.0 x 1.8 cm (series 4, image 5), not well
evaluated on this motion degraded and noncontrast examination. No
lesion seen at this location on prior CT. Remainder of the
visualized visceral structures otherwise unremarkable.

Disc levels:

L1-2: Diffuse disc bulge with disc desiccation and intervertebral
disc space narrowing. Superimposed central to left subarticular disc
protrusion indents the ventral thecal sac (series 4, image 7).
Resultant mild to moderate spinal stenosis without frank cord
impingement. Foramina remain patent.

L2-3: Mild diffuse disc bulge with disc desiccation. Disc bulge
eccentric to the right. Mild bilateral facet hypertrophy. Resultant
mild canal with right subarticular stenosis. Foramina remain patent.

L3-4: Mild diffuse disc bulge with disc desiccation. Disc bulge
eccentric to the left. Moderate bilateral facet hypertrophy.
Resultant moderate canal with left lateral recess stenosis. Mild
left L3 foraminal narrowing. Right neural foramina remains patent.

L4-5: Trace anterolisthesis. Diffuse disc bulge with disc
desiccation. Disc bulge asymmetric to the left. Superimposed small
central to left foraminal disc extrusion with superior migration
(series 18, image 8). Moderate bilateral facet and ligament flavum
hypertrophy. Associated small joint effusion on the left. Resultant
severe canal with bilateral subarticular stenosis. Thecal sac
measures 5 mm in AP diameter at its most narrow point. Moderate left
L4 foraminal stenosis. No significant right foraminal narrowing.

L5-S1: Degenerative intervertebral disc space narrowing with diffuse
disc bulge and disc desiccation. Reactive endplate change with
marginal endplate osteophytic spurring. Broad posterior disc
osteophyte contacts the descending S1 nerve roots as they course
through the lateral recesses bilaterally (series 4, image 23). Mild
facet hypertrophy. Resultant mild to moderate bilateral lateral
recess narrowing. Central canal remains patent. Mild right worse
than left L5 foraminal stenosis.
IMPRESSION: MR THORACIC SPINE IMPRESSION:

1. Motion degraded exam.
2. No acute abnormality within the thoracic spine. No evidence for
significant disc pathology, stenosis, or evidence for neural
impingement. No metastatic disease.

MR LUMBAR SPINE IMPRESSION:

1. Abnormal T2/STIR hyperintense lesion involving the central and
left aspect of the sacrum, concerning for osseous metastatic
disease. Probable early invasion of the adjacent left S1 and S2
neural foramina, with extraosseous extension into the adjacent
presacral space. No visible pathologic fracture. Finding could be
further assessed with dedicated MRI of the sacrum/pelvis as
clinically warranted.
2. No other metastatic disease within the lumbar spine.
3. Multifactorial degenerative changes at L4-5 with resultant severe
canal and bilateral subarticular stenosis, with moderate left L4
foraminal narrowing.
4. Left eccentric disc bulge and facet hypertrophy at L3-4 with
resultant mild to moderate canal and left lateral recess stenosis.
5. Central to left subarticular disc protrusion at L1-2 with
resultant mild to moderate spinal stenosis.

## 2020-03-24 IMAGING — MR MR LUMBAR SPINE W/O CM
4 of 5 series · 23 of 48 positions shown · non-contrast
Comparison: Prior CT from [DATE]

CLINICAL DATA: Initial evaluation for low back pain, cauda equina
syndrome suspected.

EXAM:
MRI THORACIC AND LUMBAR SPINE WITHOUT CONTRAST
TECHNIQUE: Multiplanar and multiecho pulse sequences of the thoracic and lumbar
spine were obtained without intravenous contrast.

[Series 1: T2 · sagittal · 4.0mm · 0.73mm/px · 8 of 18 slices shown (1 of 2)]
[im 1/18]
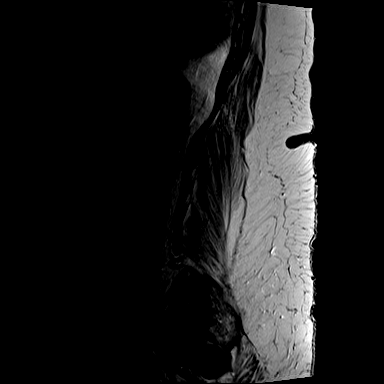
[im 3/18]
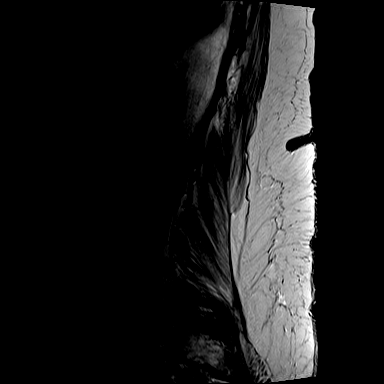
[im 5/18]
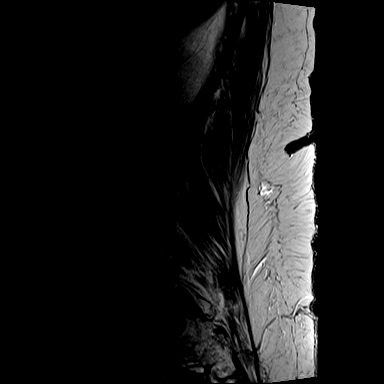
[im 8/18]
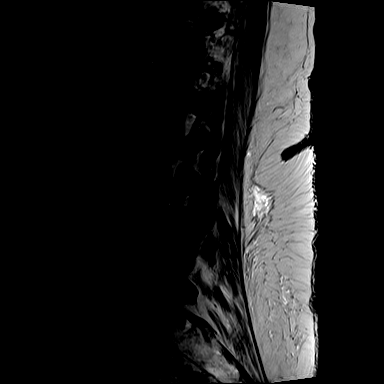
[im 10/18]
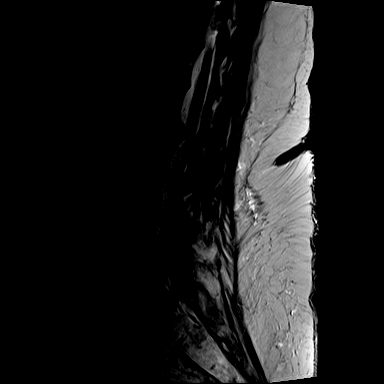
[im 13/18]
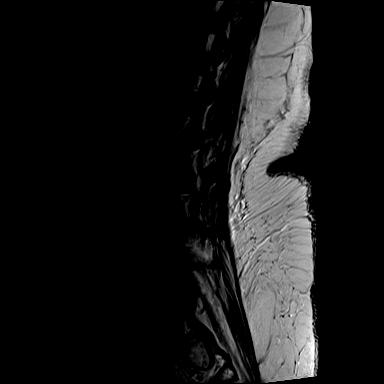
[im 15/18]
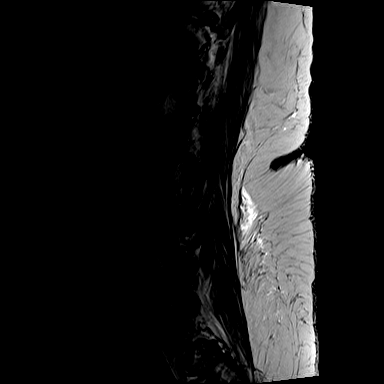
[im 18/18]
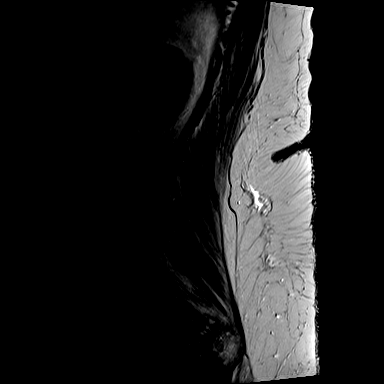

[Series 3: T1 · sagittal · 4.0mm · 0.88mm/px · 3 of 18 slices shown (1 of 2)]
[im 3/18]
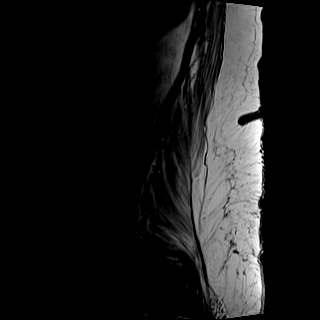
[im 10/18]
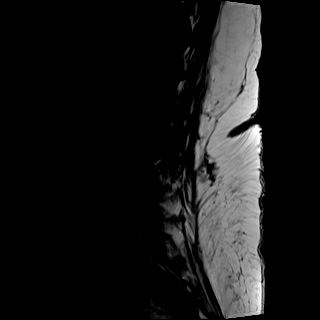
[im 15/18]
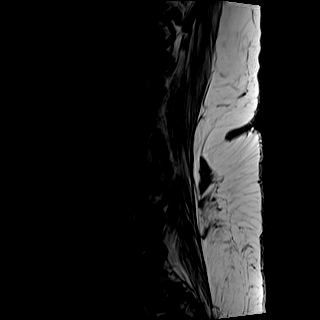

[Series 4: T2 · axial · 5.0mm · 0.57mm/px · z∈[-437,-225]mm · 9 of 26 slices shown (2 of 2)]
[im 1/26]
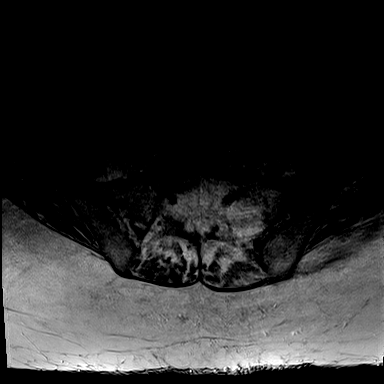
[im 5/26]
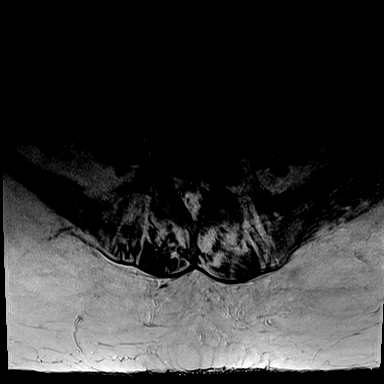
[im 7/26]
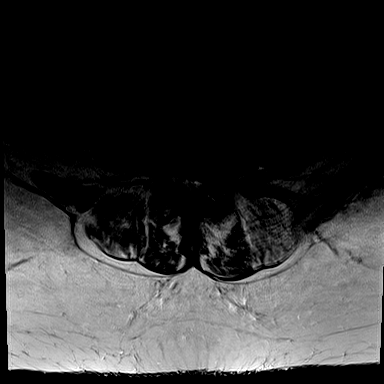
[im 12/26]
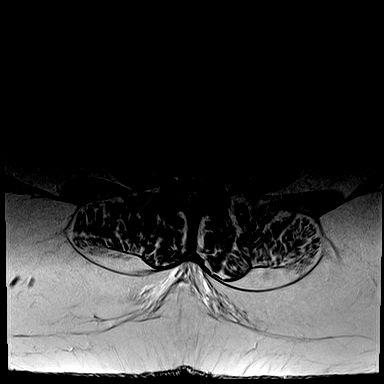
[im 14/26]
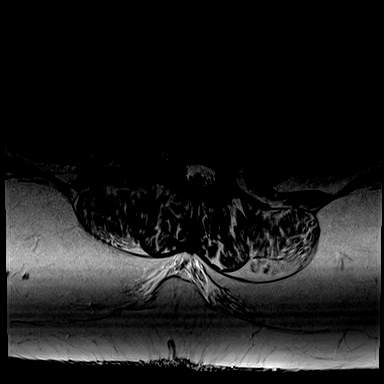
[im 19/26]
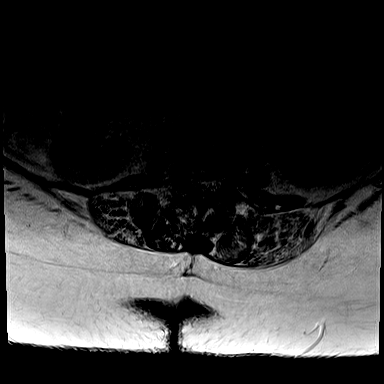
[im 21/26]
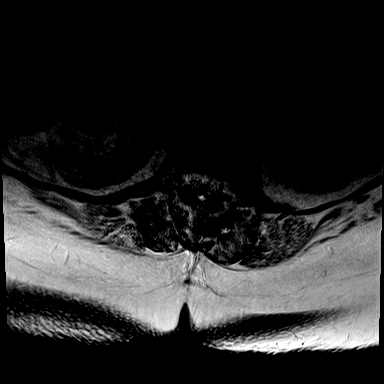
[im 23/26]
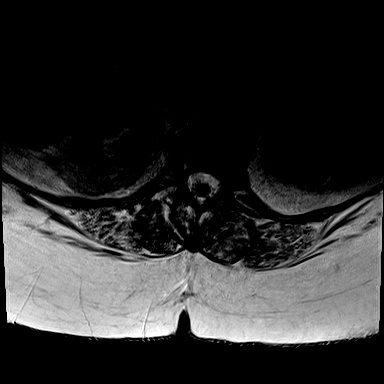
[im 26/26]
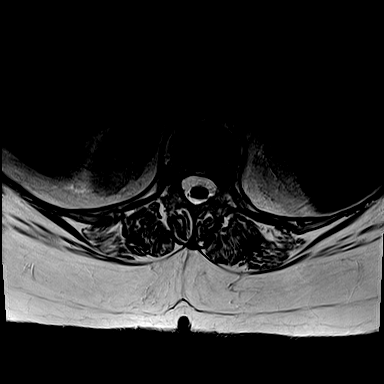

[Series 5: T1 · axial · 5.0mm · 0.34mm/px · z∈[-407,-247]mm · 3 of 26 slices shown (2 of 2)]
[im 5/26]
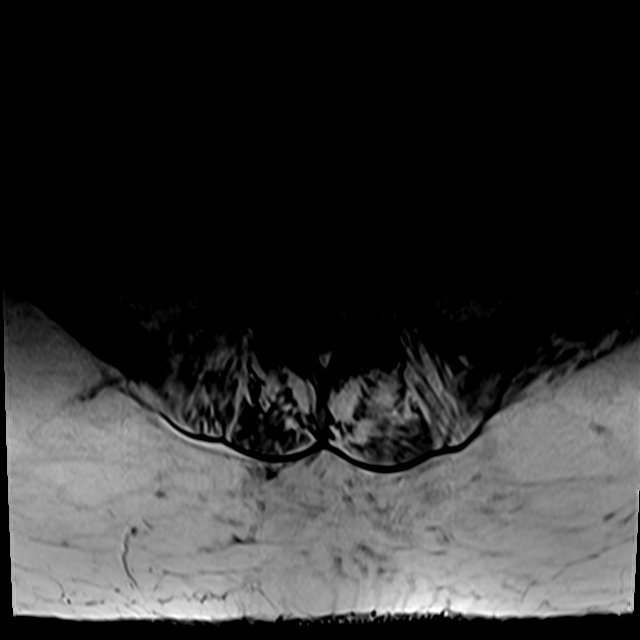
[im 14/26]
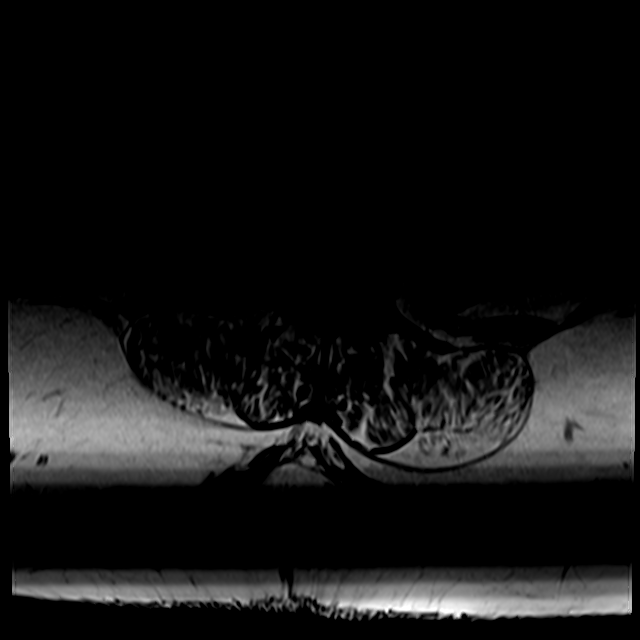
[im 23/26]
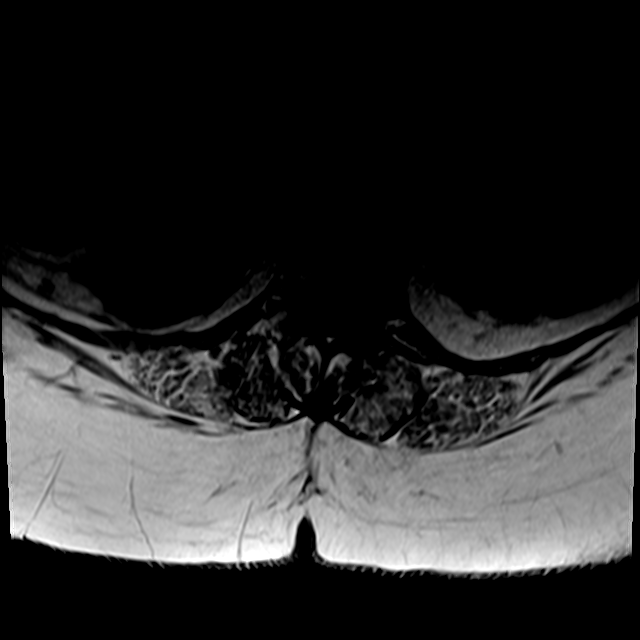

[23 of 48 positions shown; findings below may reference images not displayed]

FINDINGS: MRI THORACIC SPINE FINDINGS

Alignment:  Examination degraded by motion artifact.

Vertebral bodies normally aligned with preservation of the normal
thoracic kyphosis. No listhesis or static subluxation.

Vertebrae: Vertebral body height maintained without acute or chronic
fracture. Bone marrow signal intensity mildly heterogeneous without
worrisome osseous lesion. No evidence for metastatic disease within
the thoracic spine. Subcentimeter benign hemangioma noted within the
T10 vertebral body.

Cord:  Normal signal and morphology on this motion degraded exam.

Paraspinal and other soft tissues: Paraspinous soft tissues
demonstrate no acute finding. Probable scattered atelectatic changes
noted within the partially visualized lungs. Visualized visceral
structures otherwise grossly unremarkable.

Disc levels:

Normal expected for age multilevel disc desiccation with mild
annular disc bulging, most notable at T4-5, T10-11, and T11-12. No
significant spinal stenosis or evidence for cord deformity or
impingement. Foramina remain patent. No neural impingement.

MRI LUMBAR SPINE FINDINGS

Segmentation:  Examination moderately degraded by motion artifact.

Standard segmentation. Lowest well-formed disc space labeled the
L5-S1 level.

Alignment: Levoscoliosis. Trace 2 mm retrolisthesis of L3 on L4,
with trace 2 mm anterolisthesis of L4 on L5, chronic and facet
mediated. Alignment otherwise normal with preservation of the normal
lumbar lordosis.

Vertebrae: There is an abnormal T2/STIR hyperintense lesion
involving the central and left aspect of the sacrum, concerning for
osseous metastatic disease (series 2, image 9). This appears to be
new as compared to most recent available CT from [DATE].
Probable early invasion of the adjacent left S1 and possibly S2
neural foramina, partially visualized (series 5, image 26). Possible
early invasion of the right S1 neural foramen noted as well.
Probable extraosseous extension into the adjacent presacral space
noted on sagittal view (series 2, image 13). No visible pathologic
fracture.

No other worrisome osseous lesions or evidence for metastatic
disease seen within the lumbar spine. Underlying bone marrow signal
intensity somewhat diffusely heterogeneous. Vertebral body height
maintained without acute or chronic fracture. No other abnormal
marrow edema.

Conus medullaris and cauda equina: Conus extends to the L1-2 level.
Conus and cauda equina appear normal.

Paraspinal and other soft tissues: Paraspinous soft tissues
demonstrate no acute finding on this motion degraded exam. There is
question of a heterogeneous lesion within the interpolar right
kidney measuring 2.0 x 1.8 cm (series 4, image 5), not well
evaluated on this motion degraded and noncontrast examination. No
lesion seen at this location on prior CT. Remainder of the
visualized visceral structures otherwise unremarkable.

Disc levels:

L1-2: Diffuse disc bulge with disc desiccation and intervertebral
disc space narrowing. Superimposed central to left subarticular disc
protrusion indents the ventral thecal sac (series 4, image 7).
Resultant mild to moderate spinal stenosis without frank cord
impingement. Foramina remain patent.

L2-3: Mild diffuse disc bulge with disc desiccation. Disc bulge
eccentric to the right. Mild bilateral facet hypertrophy. Resultant
mild canal with right subarticular stenosis. Foramina remain patent.

L3-4: Mild diffuse disc bulge with disc desiccation. Disc bulge
eccentric to the left. Moderate bilateral facet hypertrophy.
Resultant moderate canal with left lateral recess stenosis. Mild
left L3 foraminal narrowing. Right neural foramina remains patent.

L4-5: Trace anterolisthesis. Diffuse disc bulge with disc
desiccation. Disc bulge asymmetric to the left. Superimposed small
central to left foraminal disc extrusion with superior migration
(series 18, image 8). Moderate bilateral facet and ligament flavum
hypertrophy. Associated small joint effusion on the left. Resultant
severe canal with bilateral subarticular stenosis. Thecal sac
measures 5 mm in AP diameter at its most narrow point. Moderate left
L4 foraminal stenosis. No significant right foraminal narrowing.

L5-S1: Degenerative intervertebral disc space narrowing with diffuse
disc bulge and disc desiccation. Reactive endplate change with
marginal endplate osteophytic spurring. Broad posterior disc
osteophyte contacts the descending S1 nerve roots as they course
through the lateral recesses bilaterally (series 4, image 23). Mild
facet hypertrophy. Resultant mild to moderate bilateral lateral
recess narrowing. Central canal remains patent. Mild right worse
than left L5 foraminal stenosis.
IMPRESSION: MR THORACIC SPINE IMPRESSION:

1. Motion degraded exam.
2. No acute abnormality within the thoracic spine. No evidence for
significant disc pathology, stenosis, or evidence for neural
impingement. No metastatic disease.

MR LUMBAR SPINE IMPRESSION:

1. Abnormal T2/STIR hyperintense lesion involving the central and
left aspect of the sacrum, concerning for osseous metastatic
disease. Probable early invasion of the adjacent left S1 and S2
neural foramina, with extraosseous extension into the adjacent
presacral space. No visible pathologic fracture. Finding could be
further assessed with dedicated MRI of the sacrum/pelvis as
clinically warranted.
2. No other metastatic disease within the lumbar spine.
3. Multifactorial degenerative changes at L4-5 with resultant severe
canal and bilateral subarticular stenosis, with moderate left L4
foraminal narrowing.
4. Left eccentric disc bulge and facet hypertrophy at L3-4 with
resultant mild to moderate canal and left lateral recess stenosis.
5. Central to left subarticular disc protrusion at L1-2 with
resultant mild to moderate spinal stenosis.

## 2020-03-24 IMAGING — CR DG HIP (WITH OR WITHOUT PELVIS) 2-3V*L*
3 series · 3 of 3 positions shown · non-contrast
Comparison: None.

CLINICAL DATA: Pain status post fall

EXAM:
DG HIP (WITH OR WITHOUT PELVIS) 2-3V LEFT

[pelvis ap]
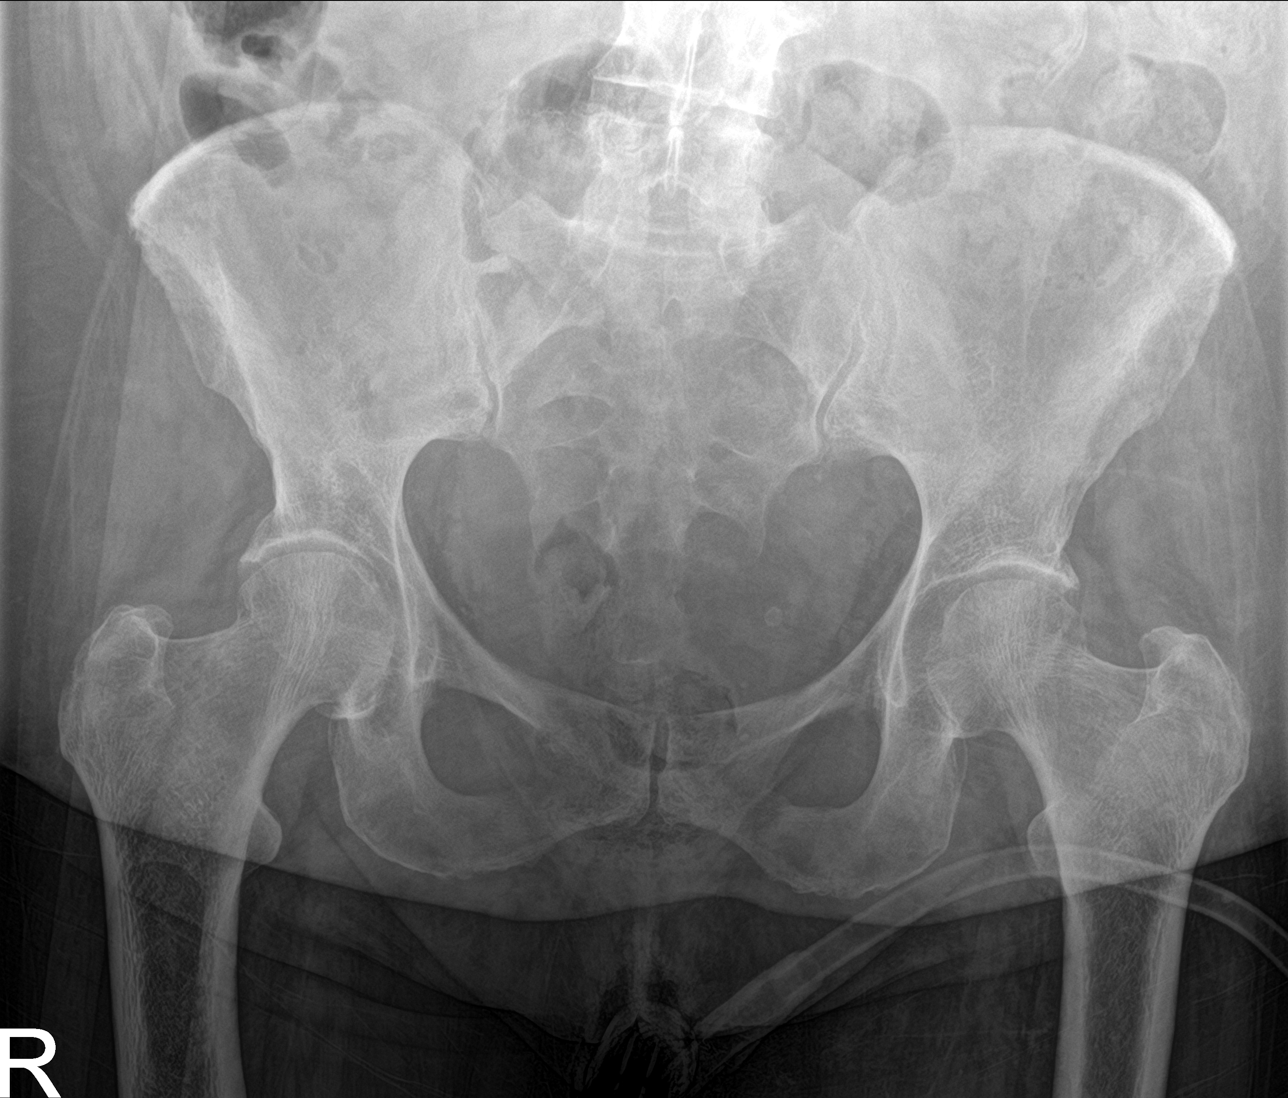

[hip ap]
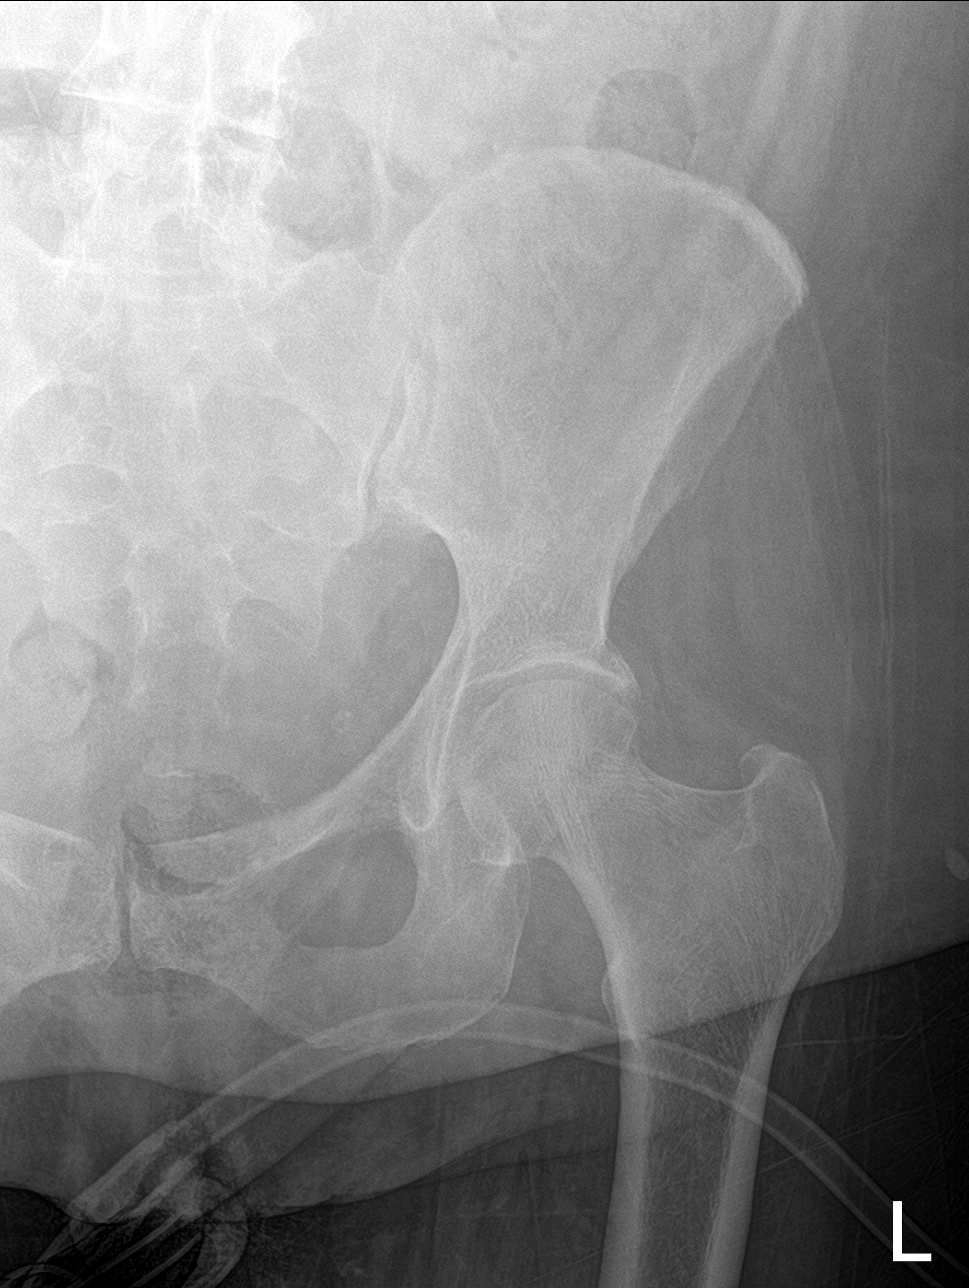

[hip lat]
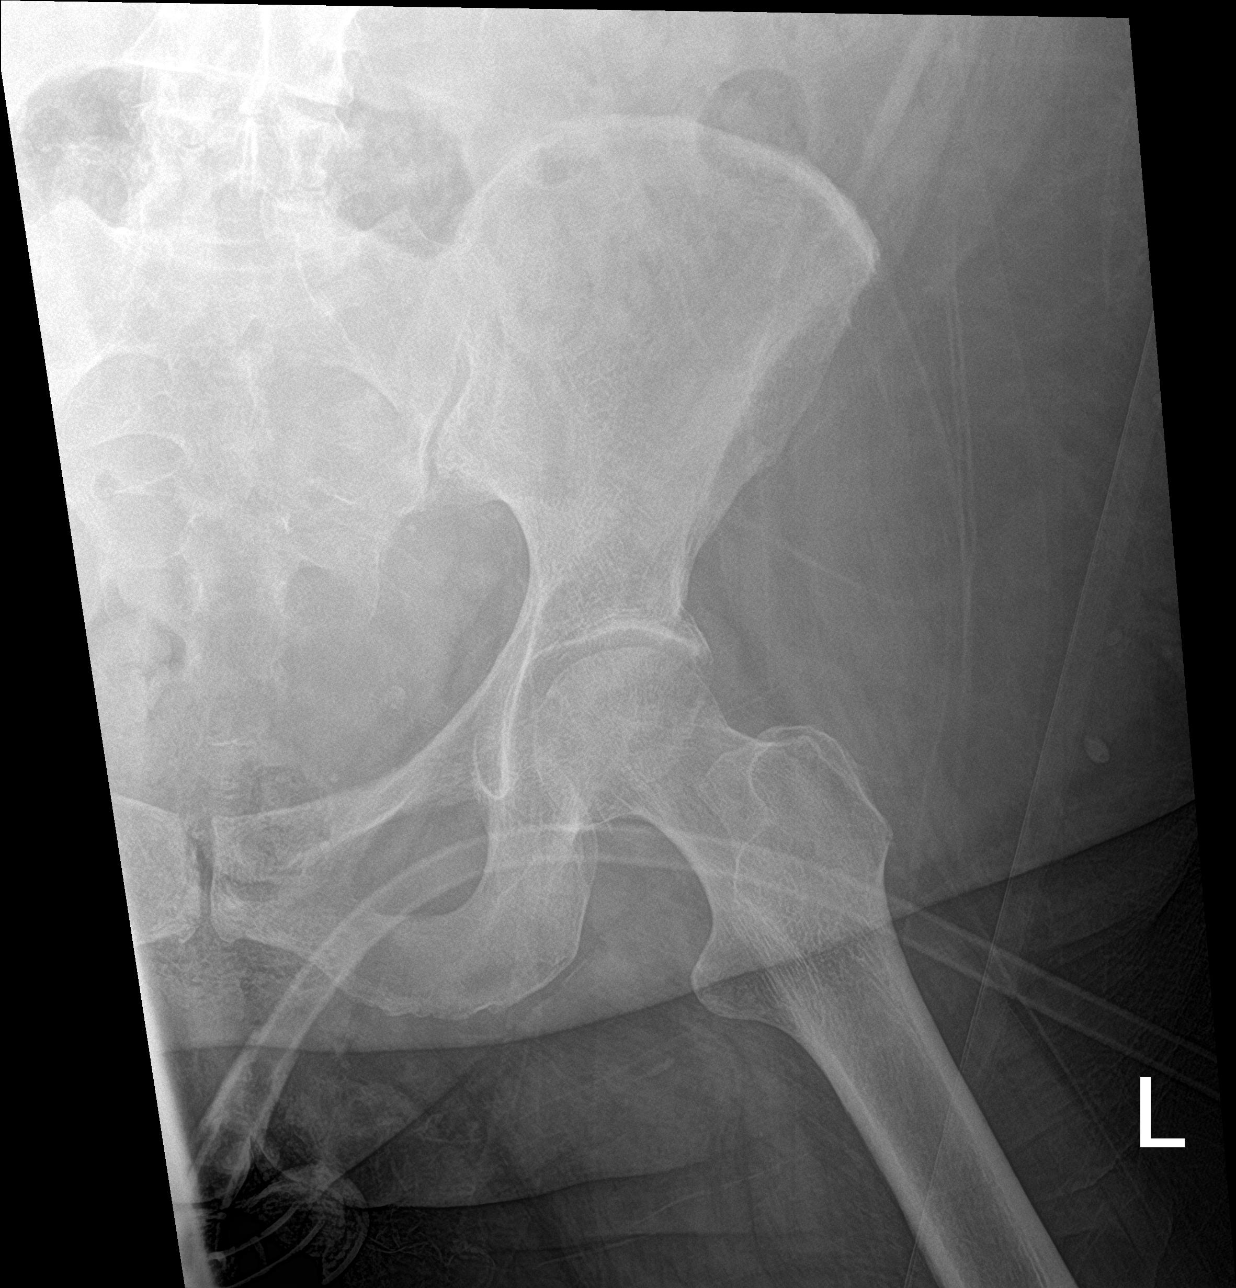

[3 of 3 positions shown; findings below may reference images not displayed]

FINDINGS: There is mild bilateral hip osteoarthritis. There is no acute
displaced fracture or dislocation. There are calcifications
projecting over the patient's pelvis that are favored to represent
phleboliths.
IMPRESSION: Mild bilateral hip osteoarthritis. No acute displaced fracture or
dislocation.

## 2020-03-24 IMAGING — CR DG KNEE COMPLETE 4+V*L*
4 series · 4 of 4 positions shown · non-contrast
Comparison: None.

CLINICAL DATA: Pain

EXAM:
LEFT KNEE - COMPLETE 4+ VIEW

[knee ap]
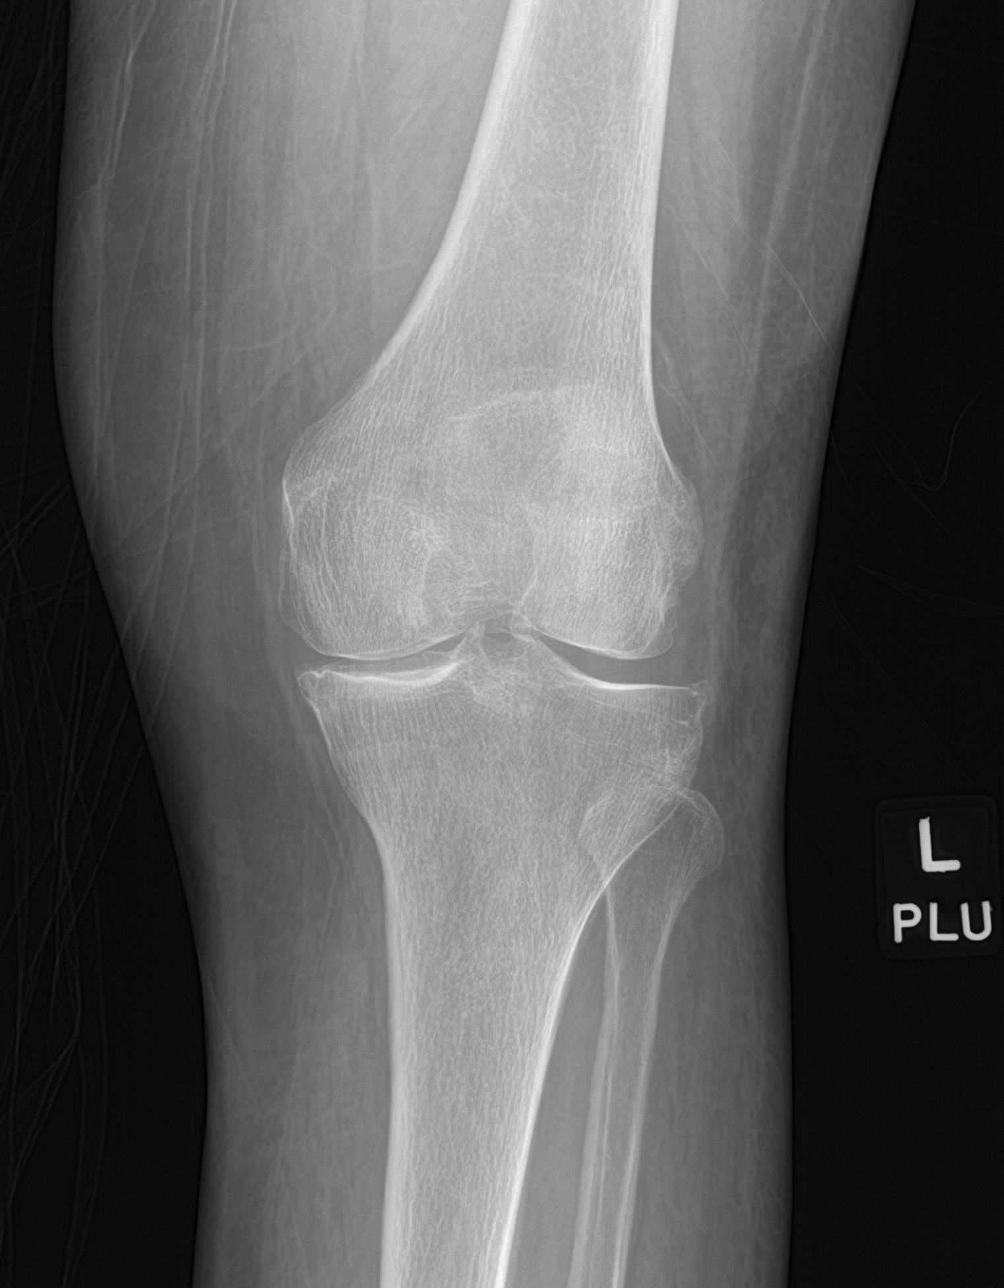

[knee lat]
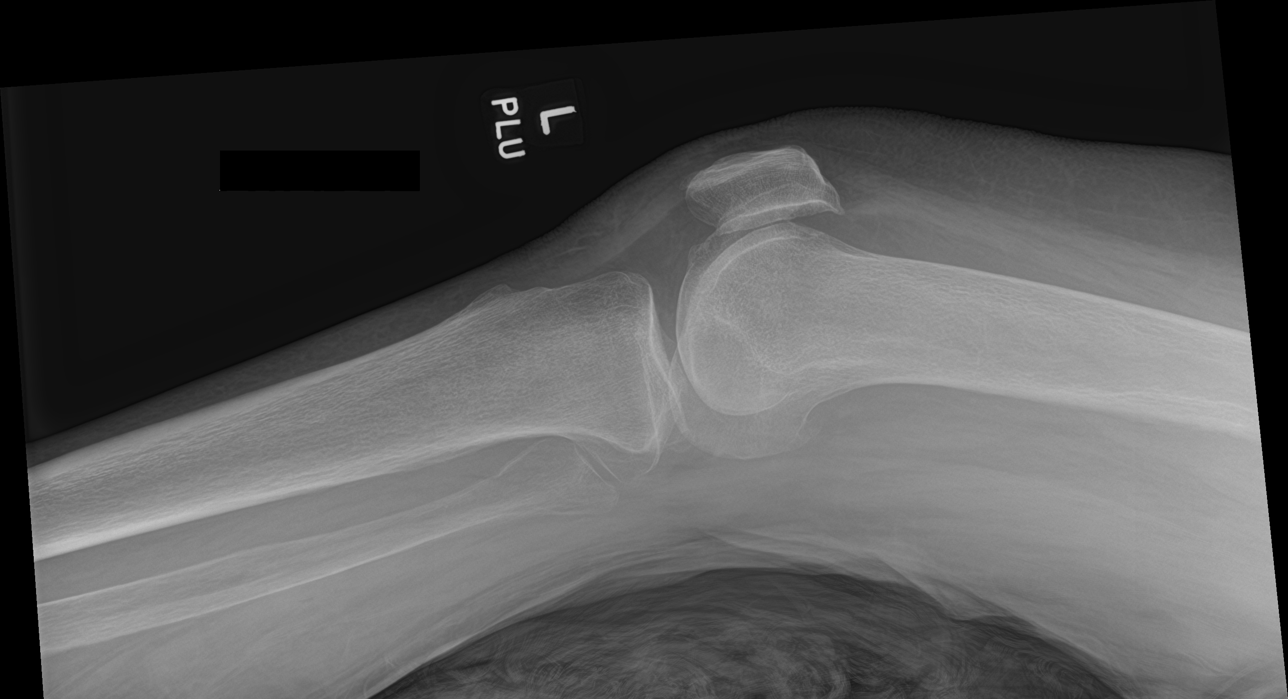

[knee obl (1 of 2)]
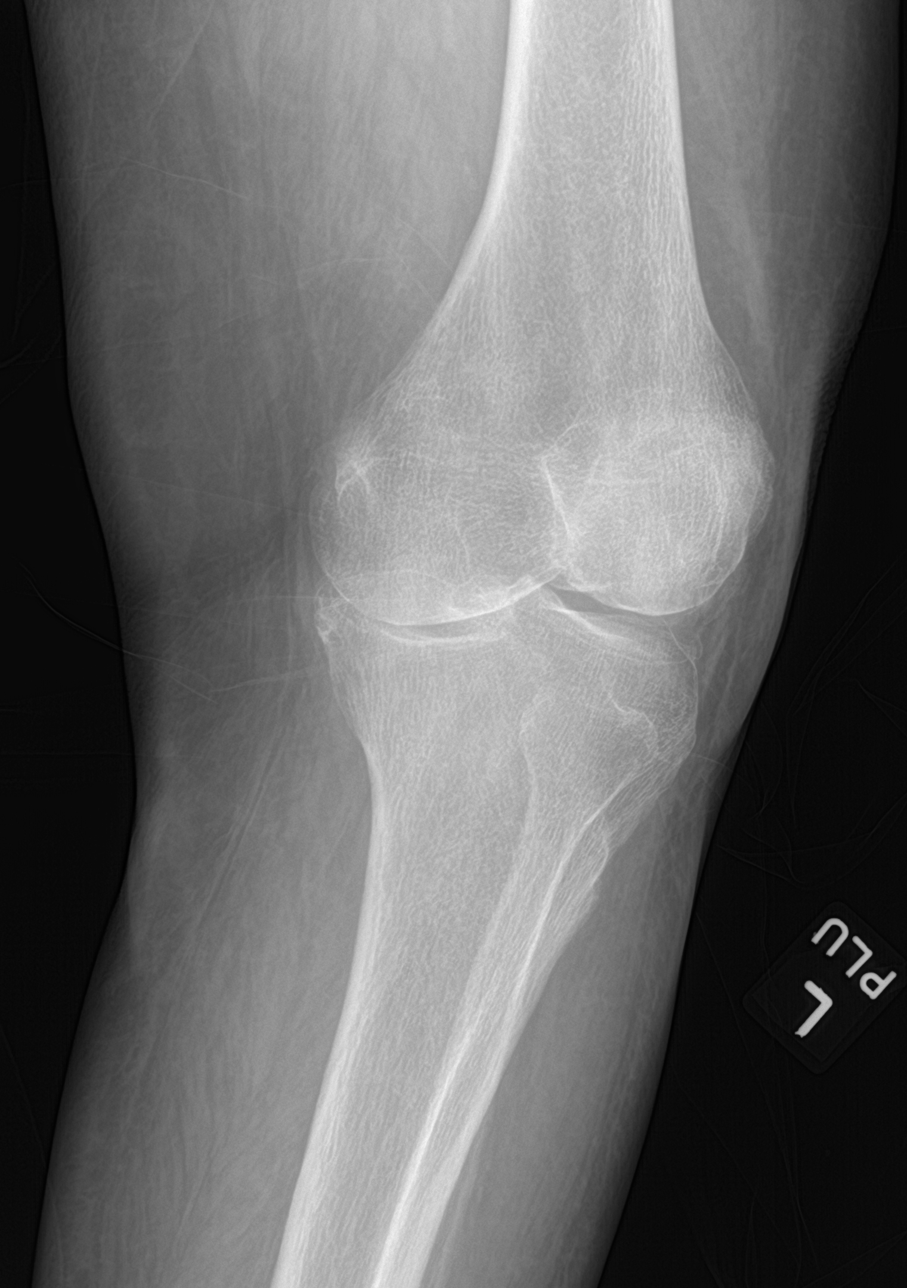

[knee obl (2 of 2)]
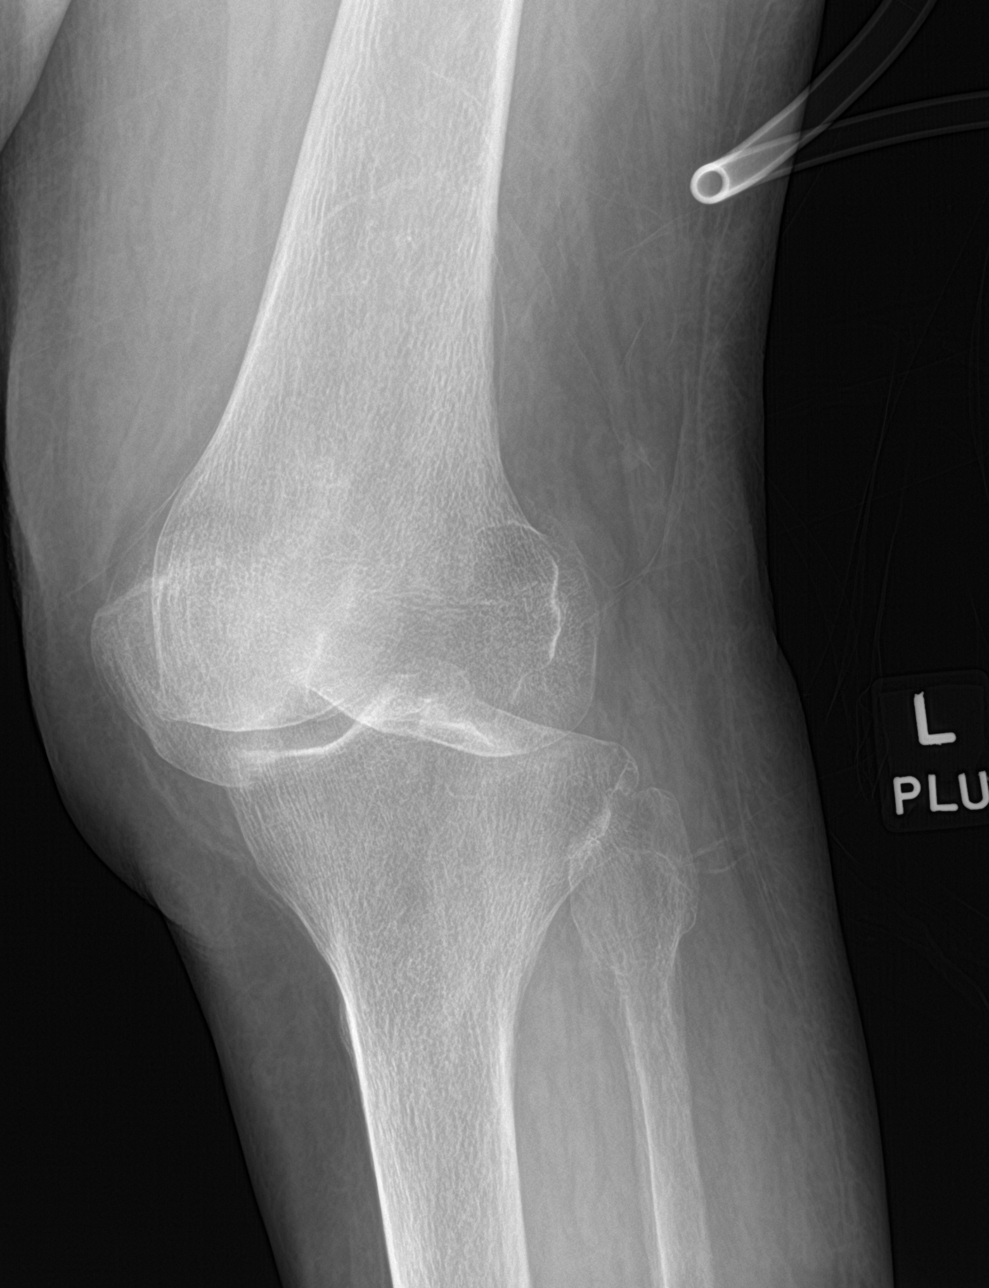

[4 of 4 positions shown; findings below may reference images not displayed]

FINDINGS: There are moderate tricompartmental degenerative changes, greatest
within the medial compartment. There is no significant joint
effusion. No acute displaced fracture or dislocation.
IMPRESSION: Moderate tricompartmental degenerative changes, greatest within the
medial compartment.

## 2020-03-24 MED ORDER — SODIUM CHLORIDE 0.9 % IV BOLUS
1000.0000 mL | Freq: Once | INTRAVENOUS | Status: AC
  Administered 2020-03-24: 1000 mL via INTRAVENOUS

## 2020-03-24 MED ORDER — LORAZEPAM 2 MG/ML IJ SOLN
1.0000 mg | Freq: Once | INTRAMUSCULAR | Status: AC | PRN
  Administered 2020-03-24: 1 mg via INTRAVENOUS
  Filled 2020-03-24: qty 1

## 2020-03-24 MED ORDER — HYDROMORPHONE HCL 1 MG/ML IJ SOLN
1.0000 mg | Freq: Once | INTRAMUSCULAR | Status: AC
  Administered 2020-03-24: 1 mg via INTRAVENOUS
  Filled 2020-03-24: qty 1

## 2020-03-24 NOTE — ED Notes (Signed)
Bladder scan 250ML

## 2020-03-24 NOTE — ED Provider Notes (Signed)
[facility] EMERGENCY DEPARTMENT Provider Note   CSN: [id number] Arrival date & time: 03/24/20  2128     History Chief Complaint  Patient presents with  . Weakness  . Fatigue    Tonya Dougherty is a 69 y.o. female.  HPI 69 year old female presents with generalized weakness. It has progressed to the point that family made her call 911. She has not had any fevers or significant headaches. No back pain but she has been having left buttocks pain radiating down her left leg. Both her arms and legs are weak. However she has been noticing that she is having urinary incontinence and bowel incontinence over these past couple weeks. Occasionally she will see drops of blood in her depends that she thinks is from a hemorrhoid. No blood in the stool. The weakness has been in her legs primarily diffusely and was not starting in her feet and ascending. At one point she fell and injured her left knee. This weakness feels like when she needed a blood transfusion last year.    Past Medical History:  Diagnosis Date  . Allergic rhinitis   . Anxiety   . Arthritis   . Bell's palsy   . Cataract   . Cirrhosis ([place])   . Coarse tremors   . Colon cancer ([place])   . Diabetes mellitus without complication ([place])   . Diverticulosis   . DVT (deep venous thrombosis) ([place])   . GERD (gastroesophageal reflux disease)   . GI bleed   . Hepatitis B   . History of blood transfusion 05/2019  . History of panic attacks   . Hypertension   . Insomnia   . Iron deficiency anemia   . MDD (major depressive disorder)   . Morbid obesity ([place])   . Neuropathy   . Portal vein thrombosis   . Psoriasis   . Renal insufficiency     Patient Active Problem List   Diagnosis Date Noted  . Cancer of sigmoid colon ([place]) 08/14/2019  . DVT (deep venous thrombosis) ([place]) 10/22/2017    Past Surgical History:  Procedure Laterality Date  . ANKLE SURGERY Right   . CATARACT EXTRACTION, BILATERAL    . COLONOSCOPY  WITH ESOPHAGOGASTRODUODENOSCOPY (EGD)  05/2019  . FLEXIBLE SIGMOIDOSCOPY N/A 08/14/2019   Procedure: FLEXIBLE SIGMOIDOSCOPY;  Surgeon: Ileana Roup, MD;  Location: [facility] ORS;  Service: General;  Laterality: N/A;     OB History   No obstetric history on file.     Family History  Problem Relation Age of Onset  . Kidney failure Mother   . Diabetes Mother   . Obesity Mother   . Hypertension Mother   . Cirrhosis Father   . Hypertension Sister   . Kidney failure Brother   . Hypertension Brother   . Hypertension Sister   . Alcoholism Brother   . Other Brother        MRSA  . Cirrhosis Daughter   . Drug abuse Daughter     Social History   Tobacco Use  . Smoking status: Current Some Day Smoker    Packs/day: 0.50    Types: Cigarettes  . Smokeless tobacco: Never Used  Vaping Use  . Vaping Use: Some days  . Substances: Nicotine, Flavoring  Substance Use Topics  . Alcohol use: No  . Drug use: Not Currently    Home Medications Prior to Admission medications   Medication Sig Start Date End Date Taking? Authorizing Provider  acetaminophen (TYLENOL) 500 MG tablet Take 1,000  mg by mouth every 6 (six) hours as needed for mild pain.     [provider]  albuterol (VENTOLIN HFA) 108 (90 Base) MCG/ACT inhaler Inhale 2 puffs into the lungs every 6 (six) hours as needed for wheezing or shortness of breath.  07/30/18 11/25/19  [provider]  B-D ULTRAFINE III SHORT PEN 31G X 8 MM MISC USE AS DIRECTED TO INJECT LANTUS DAILY 10/24/17   [provider]  Calcium Carb-Cholecalciferol (CALCIUM 600+D) 600-800 MG-UNIT TABS Take 2 tablets by mouth at bedtime.    [provider]  carboxymethylcellulose (REFRESH PLUS) 0.5 % SOLN Place 1 drop into both eyes 3 (three) times daily as needed (dry/irritated eyes).    [provider]  carvedilol (COREG) 3.125 MG tablet Take 3.125 mg by mouth 2 (two) times daily with a meal.    [provider]    cephALEXin (KEFLEX) 500 MG capsule Take 1 capsule (500 mg total) by mouth 4 (four) times daily. Patient not taking: Reported on 07/29/2019 06/11/19   Raylene Everts, MD  citalopram (CELEXA) 10 MG tablet Take 30 mg by mouth at bedtime.     [provider]  ELIQUIS 5 MG TABS tablet TAKE 1 TABLET BY MOUTH TWICE A DAY 03/10/20   Waynetta Sandy, MD  esomeprazole ([place]) 20 MG capsule Take by mouth. 03/18/18 03/18/19  [provider]  ferrous sulfate 325 (65 FE) MG tablet Take 325 mg by mouth daily with breakfast.    [provider]  fluticasone (FLONASE) 50 MCG/ACT nasal spray Place 1-2 sprays into both nostrils daily as needed (allergies.).  10/01/18   [provider]  furosemide (LASIX) 20 MG tablet Take 20 mg by mouth every evening.     [provider]  gabapentin (NEURONTIN) 300 MG capsule Take 600 mg by mouth at bedtime. 06/02/19   [provider]  Hydrocortisone, Perianal, 1 % CREA Apply 1 application topically 2 (two) times daily as needed (irritation).  06/26/19   [provider]  Insulin Glargine (BASAGLAR KWIKPEN) 100 UNIT/ML SOPN Inject 40 Units into the skin at bedtime. 10/11/17   [provider]  lidocaine (LIDODERM) 5 % Place 1 patch onto the skin at bedtime. Apply to affected area for 12 hours only each day (then remove patch) 05/11/19   [provider]  Magnesium 100 MG TABS Take 100 mg by mouth at bedtime. Magnesium Citrate    [provider]  Melatonin 5 MG CAPS Take 15 mg by mouth at bedtime.  04/29/18   [provider]  omeprazole (PRILOSEC) 20 MG capsule Take 20 mg by mouth at bedtime.    [provider]  permethrin (ELIMITE) 5 % cream Apply 1 application topically daily as needed (skin irritation.).  06/26/19   [provider]  polyethylene glycol powder (GLYCOLAX/MIRALAX) 17 GM/SCOOP powder Take 17 g by mouth daily as needed (constipation.).     [provider]  predniSONE (DELTASONE) 20 MG tablet Take 2 tablets daily with breakfast. 11/25/19   Jaynee Eagles, PA-C  silver sulfADIAZINE (SILVADENE) 1 % cream Apply 1 application topically daily. Patient taking differently: Apply 1 application topically daily as needed (skin irritation.).  06/11/19   Raylene Everts, MD  spironolactone (ALDACTONE) 50 MG tablet Take 50 mg by mouth every evening.  09/03/18   [provider]  tiZANidine (ZANAFLEX) 4 MG tablet Take 1 tablet (4 mg total) by mouth at bedtime. 11/25/19   Jaynee Eagles, PA-C  diphenhydrAMINE (  BENADRYL) 50 MG tablet Take 1 tablet by mouth at 1pm prior to ct scan 06/01/19 06/11/19  Irene Shipper, MD    Allergies    Xarelto [rivaroxaban], Erythromycin, Lipitor [atorvastatin], Pravachol [pravastatin], and Ivp dye [iodinated diagnostic agents]  Review of Systems   Review of Systems  Constitutional: Negative for fever.  Cardiovascular: Negative for chest pain.  Gastrointestinal: Negative for abdominal pain.  Genitourinary:       Bowel/bladder incontinence  Musculoskeletal: Positive for arthralgias. Negative for back pain.  Neurological: Positive for weakness.  All other systems reviewed and are negative.   Physical Exam Updated Vital Signs BP (!) 146/71   Pulse 84   Temp 98.5 F (36.9 C)   Resp 16   Ht 5\' 2"  (1.575 m)   Wt 111.9 kg   SpO2 90%   BMI 45.12 kg/m   Physical Exam Vitals and nursing note reviewed. Exam conducted with a chaperone present.  Constitutional:      Appearance: She is well-developed. She is obese.  HENT:     Head: Normocephalic and atraumatic.     Right Ear: External ear normal.     Left Ear: External ear normal.     Nose: Nose normal.  Eyes:     General:        Right eye: No discharge.        Left eye: No discharge.  Cardiovascular:     Rate and Rhythm: Normal rate and regular rhythm.     Heart sounds: Normal heart sounds.  Pulmonary:     Effort: Pulmonary effort is normal.      Breath sounds: Normal breath sounds.  Abdominal:     Palpations: Abdomen is soft.     Tenderness: There is no abdominal tenderness.  Genitourinary:    Rectum: External hemorrhoid (non-thrombosed) present.     Comments: Brown stool on rectal exam There is peri-anal numbness to touch Normal rectal tone Musculoskeletal:     Left knee: Ecchymosis present. Tenderness present.     Comments: Pain to palpation over left buttocks  Skin:    General: Skin is warm and dry.  Neurological:     Mental Status: She is alert.     Comments: 5/5 strength in BUE. 4/5 strength in BLE. Normal gross sensation  Psychiatric:        Mood and Affect: Mood is not anxious.     ED Results / Procedures / Treatments   Labs (all labs ordered are listed, but only abnormal results are displayed) Labs Reviewed  CBC WITH DIFFERENTIAL/PLATELET - Abnormal; Notable for the following components:      Result Value   RDW 19.9 (*)    All other components within normal limits  COMPREHENSIVE METABOLIC PANEL - Abnormal; Notable for the following components:   Glucose, Bld 171 (*)    BUN 27 (*)    Creatinine, Ser 1.01 (*)    Albumin 2.9 (*)    Alkaline Phosphatase 130 (*)    All other components within normal limits  POC OCCULT BLOOD, ED - Abnormal; Notable for the following components:   Fecal Occult Bld POSITIVE (*)    All other components within normal limits  URINALYSIS, ROUTINE W REFLEX MICROSCOPIC  I-STAT CHEM 8, ED  TYPE AND SCREEN    EKG EKG Interpretation  Date/Time:  Thursday March 24 2020 21:56:45 EST Ventricular Rate:  75 PR Interval:    QRS Duration: 91 QT Interval:  395 QTC Calculation: 442 R Axis:   46  Text Interpretation: Sinus rhythm T wave changes improved compared to 2020 Confirmed by Sherwood Gambler [phone]) on 03/24/2020 9:58:59 PM   Radiology No results found.  Procedures Procedures (including critical care time)  Medications Ordered in ED Medications  sodium chloride 0.9 %  bolus 1,000 mL (1,000 mLs Intravenous Bolus 03/24/20 2209)  LORazepam (ATIVAN) injection 1 mg (1 mg Intravenous Given 03/24/20 2243)  HYDROmorphone (DILAUDID) injection 1 mg (1 mg Intravenous Given 03/24/20 2243)    ED Course  I have reviewed the triage vital signs and the nursing notes.  Pertinent labs & imaging results that were available during my care of the patient were reviewed by me and considered in my medical decision making (see chart for details).    MDM Rules/Calculators/A&P                          Given patient's lower extremity weakness in association with saddle anesthesia and incontinence, I have high concern for spinal pathology.  MRI has been ordered.  She indicates she does not think she can tolerate contrast as she has had CT contrast reactions in the past.  Discussed that there are different things but she is adamant she does not want contrast.  Given the dramatic symptoms, we should be able to see something on a noncontrasted scan.  Otherwise initial lab work is unremarkable.  Will give pain control for her back/hip pain. Care transferred to Dr. Leonette Monarch.  Final Clinical Impression(s) / ED Diagnoses Final diagnoses:  None    Rx / DC Orders ED Discharge Orders    None       Sherwood Gambler, MD 03/24/20 2318

## 2020-03-25 ENCOUNTER — Encounter (HOSPITAL_COMMUNITY): Payer: Self-pay | Admitting: Internal Medicine

## 2020-03-25 ENCOUNTER — Observation Stay (HOSPITAL_COMMUNITY): Payer: Medicare HMO

## 2020-03-25 DIAGNOSIS — C187 Malignant neoplasm of sigmoid colon: Secondary | ICD-10-CM | POA: Diagnosis not present

## 2020-03-25 DIAGNOSIS — E782 Mixed hyperlipidemia: Secondary | ICD-10-CM

## 2020-03-25 DIAGNOSIS — E1165 Type 2 diabetes mellitus with hyperglycemia: Secondary | ICD-10-CM

## 2020-03-25 DIAGNOSIS — N3000 Acute cystitis without hematuria: Secondary | ICD-10-CM | POA: Diagnosis not present

## 2020-03-25 DIAGNOSIS — L8990 Pressure ulcer of unspecified site, unspecified stage: Secondary | ICD-10-CM | POA: Insufficient documentation

## 2020-03-25 DIAGNOSIS — C801 Malignant (primary) neoplasm, unspecified: Secondary | ICD-10-CM

## 2020-03-25 DIAGNOSIS — I10 Essential (primary) hypertension: Secondary | ICD-10-CM | POA: Diagnosis not present

## 2020-03-25 DIAGNOSIS — Z794 Long term (current) use of insulin: Secondary | ICD-10-CM

## 2020-03-25 DIAGNOSIS — Z86718 Personal history of other venous thrombosis and embolism: Secondary | ICD-10-CM

## 2020-03-25 DIAGNOSIS — F17210 Nicotine dependence, cigarettes, uncomplicated: Secondary | ICD-10-CM | POA: Diagnosis present

## 2020-03-25 DIAGNOSIS — C7951 Secondary malignant neoplasm of bone: Secondary | ICD-10-CM | POA: Diagnosis present

## 2020-03-25 DIAGNOSIS — E1169 Type 2 diabetes mellitus with other specified complication: Secondary | ICD-10-CM | POA: Diagnosis present

## 2020-03-25 DIAGNOSIS — K219 Gastro-esophageal reflux disease without esophagitis: Secondary | ICD-10-CM | POA: Diagnosis present

## 2020-03-25 LAB — GLUCOSE, CAPILLARY
Glucose-Capillary: 126 mg/dL — ABNORMAL HIGH (ref 70–99)
Glucose-Capillary: 136 mg/dL — ABNORMAL HIGH (ref 70–99)
Glucose-Capillary: 160 mg/dL — ABNORMAL HIGH (ref 70–99)
Glucose-Capillary: 197 mg/dL — ABNORMAL HIGH (ref 70–99)
Glucose-Capillary: 193 mg/dL — ABNORMAL HIGH (ref 70–99)

## 2020-03-25 LAB — CBC WITH DIFFERENTIAL/PLATELET
Abs Immature Granulocytes: 0.06 10*3/uL (ref 0.00–0.07)
Basophils Absolute: 0 10*3/uL (ref 0.0–0.1)
Basophils Relative: 1 %
Eosinophils Absolute: 0.1 10*3/uL (ref 0.0–0.5)
Eosinophils Relative: 1 %
HCT: 44.5 % (ref 36.0–46.0)
Hemoglobin: 13.4 g/dL (ref 12.0–15.0)
Immature Granulocytes: 1 %
Lymphocytes Relative: 17 %
Lymphs Abs: 1.4 10*3/uL (ref 0.7–4.0)
MCH: 28.5 pg (ref 26.0–34.0)
MCHC: 30.1 g/dL (ref 30.0–36.0)
MCV: 94.7 fL (ref 80.0–100.0)
Monocytes Absolute: 0.7 10*3/uL (ref 0.1–1.0)
Monocytes Relative: 8 %
Neutro Abs: 6.1 10*3/uL (ref 1.7–7.7)
Neutrophils Relative %: 72 %
Platelets: 170 10*3/uL (ref 150–400)
RBC: 4.7 MIL/uL (ref 3.87–5.11)
RDW: 19.9 % — ABNORMAL HIGH (ref 11.5–15.5)
WBC: 8.3 10*3/uL (ref 4.0–10.5)
nRBC: 0 % (ref 0.0–0.2)

## 2020-03-25 LAB — RESP PANEL BY RT-PCR (FLU A&B, COVID) ARPGX2
Influenza A by PCR: NEGATIVE
Influenza B by PCR: NEGATIVE
SARS Coronavirus 2 by RT PCR: NEGATIVE

## 2020-03-25 LAB — COMPREHENSIVE METABOLIC PANEL
ALT: 14 U/L (ref 0–44)
AST: 19 U/L (ref 15–41)
Albumin: 2.8 g/dL — ABNORMAL LOW (ref 3.5–5.0)
Alkaline Phosphatase: 123 U/L (ref 38–126)
Anion gap: 13 (ref 5–15)
BUN: 22 mg/dL (ref 8–23)
CO2: 23 mmol/L (ref 22–32)
Calcium: 9.2 mg/dL (ref 8.9–10.3)
Chloride: 104 mmol/L (ref 98–111)
Creatinine, Ser: 0.96 mg/dL (ref 0.44–1.00)
GFR, Estimated: >60 mL/min (ref >60)
Glucose, Bld: 127 mg/dL — ABNORMAL HIGH (ref 70–99)
Potassium: 3.9 mmol/L (ref 3.5–5.1)
Sodium: 140 mmol/L (ref 135–145)
Total Bilirubin: 1.3 mg/dL — ABNORMAL HIGH (ref 0.3–1.2)
Total Protein: 7 g/dL (ref 6.5–8.1)

## 2020-03-25 LAB — MAGNESIUM: Magnesium: 2.1 mg/dL (ref 1.7–2.4)

## 2020-03-25 LAB — HIV ANTIBODY (ROUTINE TESTING W REFLEX): HIV Screen 4th Generation wRfx: NONREACTIVE

## 2020-03-25 LAB — HEMOGLOBIN A1C
Hgb A1c MFr Bld: 6.4 % — ABNORMAL HIGH (ref 4.8–5.6)
Mean Plasma Glucose: 136.98 mg/dL

## 2020-03-25 IMAGING — CT CT CHEST-ABD-PELV W/ CM
3 series · 10 of 35 positions shown, 16 images · IV contrast (omnipaque)
Comparison: CT chest abdomen pelvis dated [DATE]. MRI
thoracolumbar spine dated [DATE].

CLINICAL DATA: Left sacral lesion on MR. History of sigmoid colon
cancer.

EXAM:
CT CHEST, ABDOMEN, AND PELVIS WITH CONTRAST
TECHNIQUE: Multidetector CT imaging of the chest, abdomen and pelvis was
performed following the standard protocol during bolus
administration of intravenous contrast.
CONTRAST:  100mL OMNIPAQUE IOHEXOL 300 MG/ML  SOLN

[Series 3: delay 5mm · axial · delayed · 0.98mm/px · z∈[+1206,+1326]mm · 4 of 42 slices shown, 9 images]
[im 9/42  mediastinal]
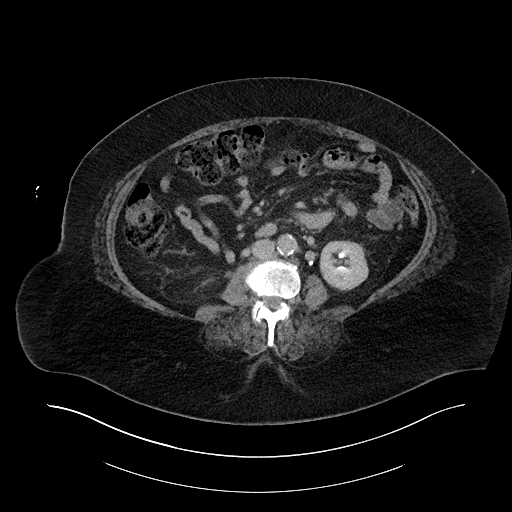
[im 9/42  lung]
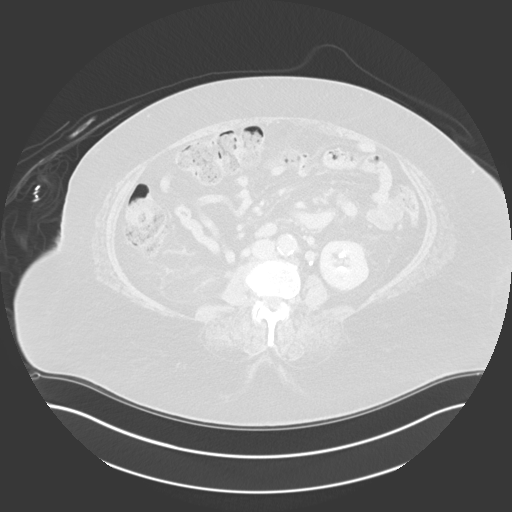
[im 9/42  bone]
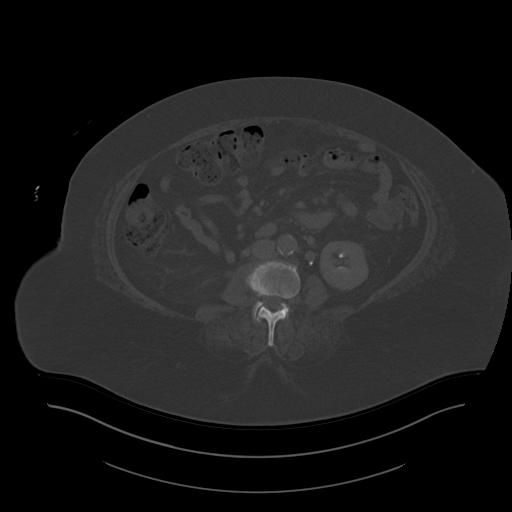
[im 17/42  mediastinal]
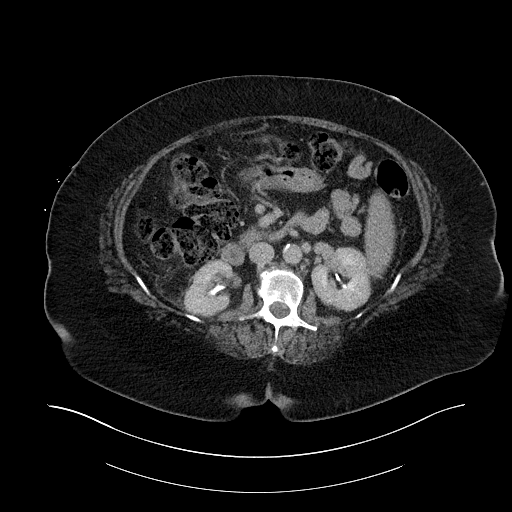
[im 17/42  lung]
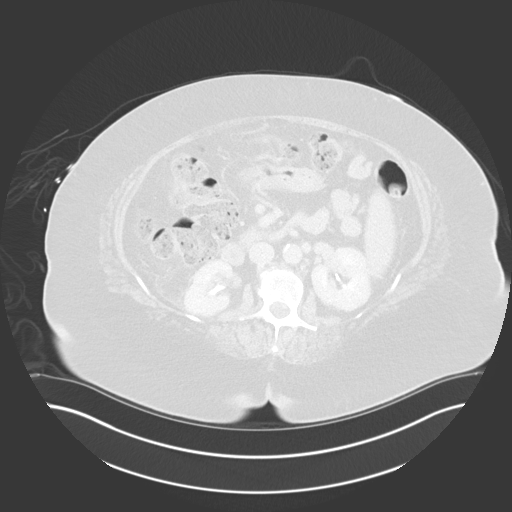
[im 25/42  mediastinal]
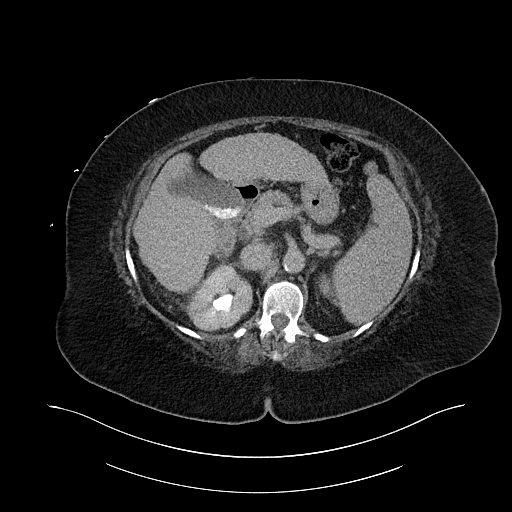
[im 25/42  lung]
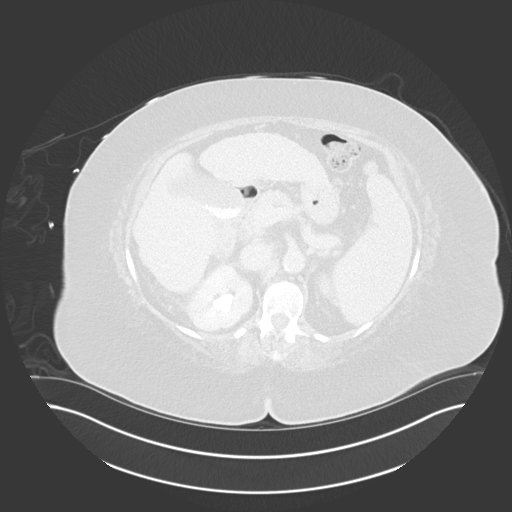
[im 33/42  mediastinal]
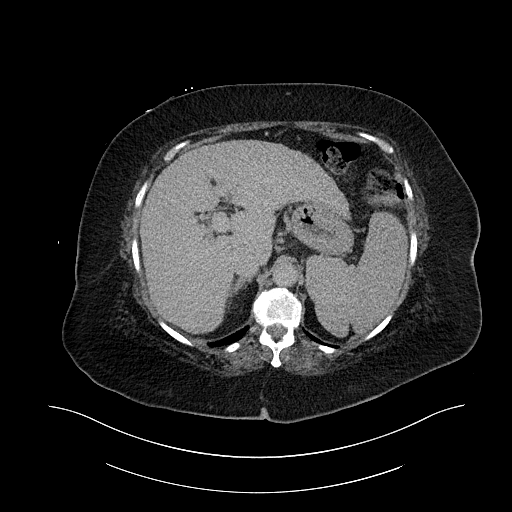
[im 33/42  lung]
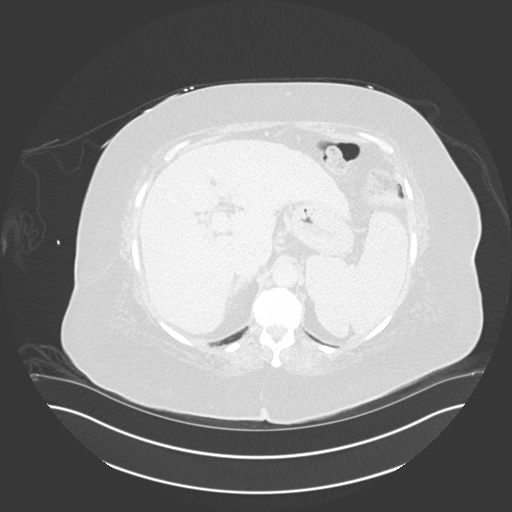

[Series 5: lung · axial · 0.87mm/px · z∈[+1342,+1398]mm · 3 of 145 slices shown]
[im 14/145  bone]
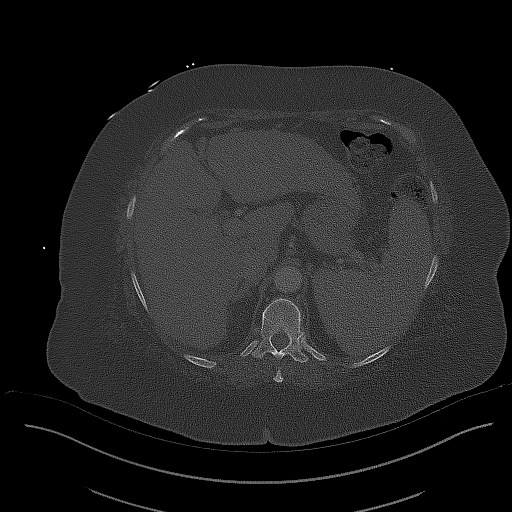
[im 28/145  bone]
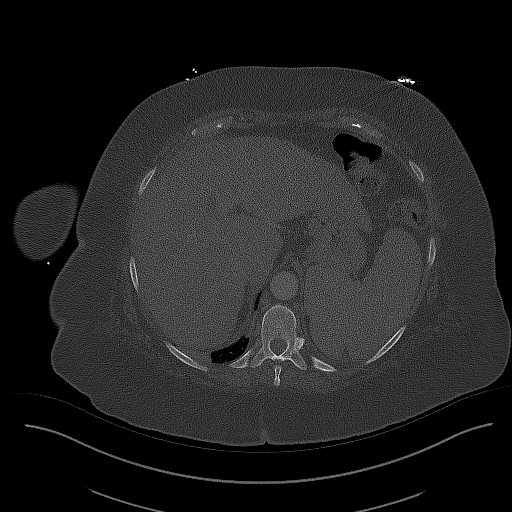
[im 42/145  bone]
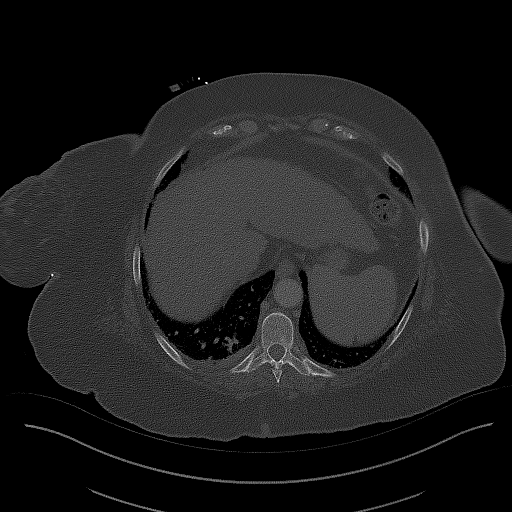

[Series 7: cap with 3mm st cor · coronal · 0.92mm/px · 3 of 151 slices shown, 4 images]
[im 31/151  mediastinal]
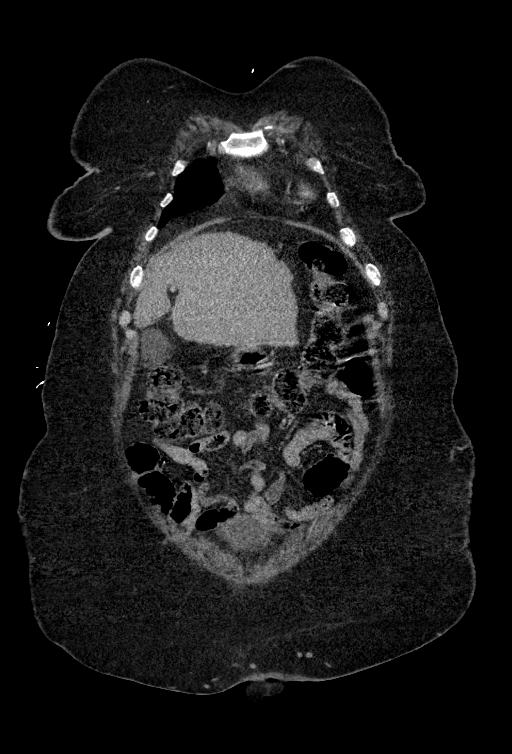
[im 61/151  mediastinal]
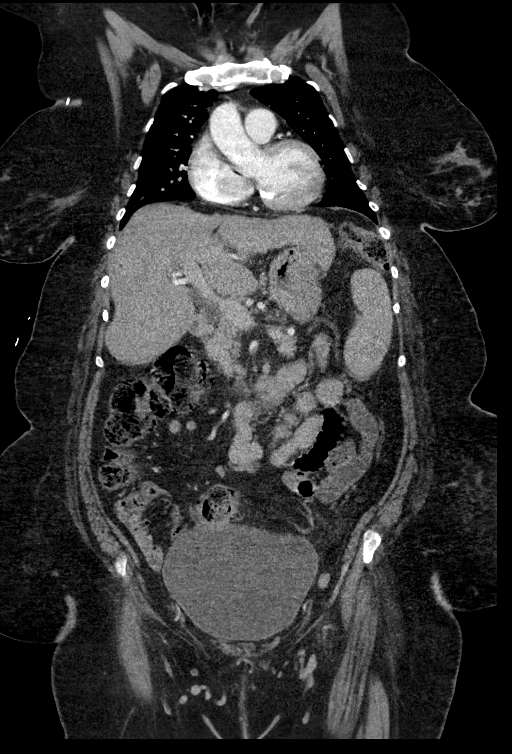
[im 61/151  bone]
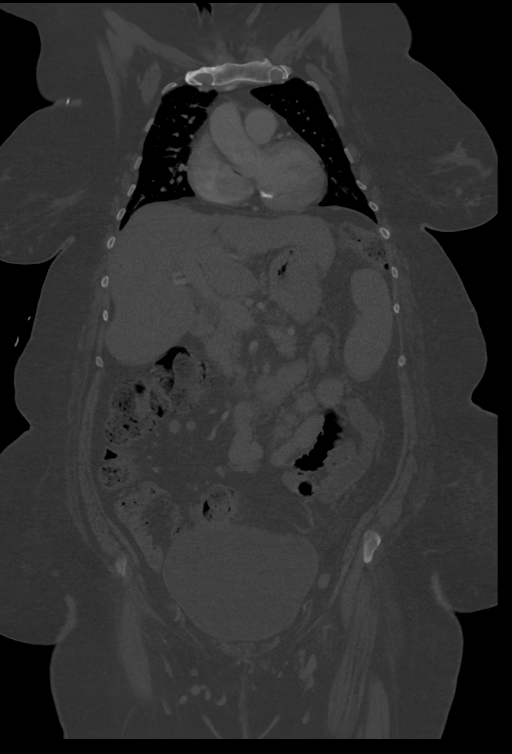
[im 91/151  mediastinal]
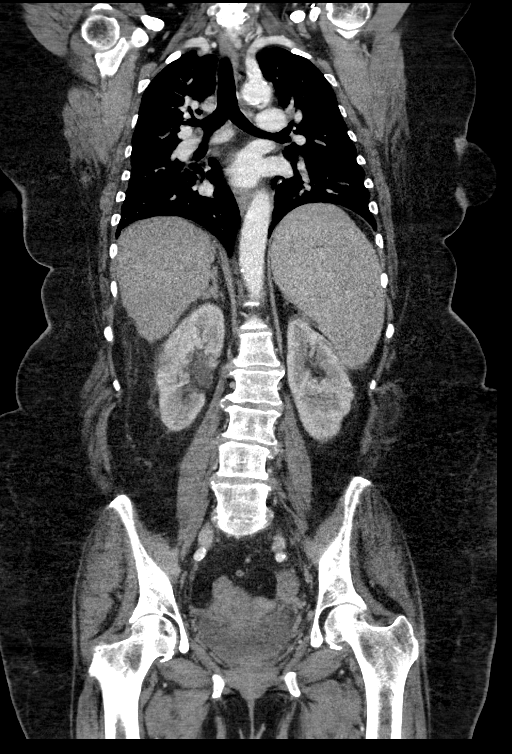

[10 of 35 positions shown; findings below may reference images not displayed]

FINDINGS: CT CHEST FINDINGS

Cardiovascular: Heart is normal in size.  No pericardial effusion.

No evidence of thoracic aortic aneurysm.

Mediastinum/Nodes: No suspicious mediastinal lymphadenopathy.

Visualized thyroid is enlarged/heterogeneous.

Lungs/Pleura: Trace right pleural effusion.

No suspicious pulmonary nodules.

Mild scarring/fibrosis in the right upper lobe.

No pleural effusion or pneumothorax.

Musculoskeletal: No focal osseous lesions.

CT ABDOMEN PELVIS FINDINGS

Hepatobiliary: Cirrhosis.  No suspicious/enhancing hepatic lesions.

Numerous layering gallstones (series 4/image 64), without associated
inflammatory changes.

Pancreas: Within normal limits.

Spleen: Enlarged, measuring 15.3 cm in maximal craniocaudal
dimension.

Adrenals/Urinary Tract: Adrenal glands are within normal limits.

2.6 cm hypoenhancing mass in the right upper kidney (series 4/image
65), essentially new. This appearance may reflect infection, but if
neoplastic, would favor metastasis over renal cell carcinoma.

Left kidney is within normal limits.  No hydronephrosis.

Bladder is within normal limits.

Stomach/Bowel: Stomach is within normal limits.

No evidence of bowel obstruction.

Appendix is not discretely visualized.

Status post left hemicolectomy with suture line in the lower abdomen
(series 4/image 95).

Vascular/Lymphatic: No evidence of abdominal aortic aneurysm.

Atherosclerotic calcifications of the abdominal aorta and branch
vessels.

No suspicious abdominopelvic lymphadenopathy.

Reproductive: Uterus is within normal limits.

Left ovary is within normal limits.

Right ovary is notable for a 3.2 cm cyst/follicle, previously
cm.

Other: No abdominopelvic ascites.

Musculoskeletal: Destructive left sacral lesion (series 4/image
100), better evaluated on recent MRI.

Associated 2.8 x 3.5 cm soft tissue component anterior to the left
sacrum (series 4/image 105), previously 2.1 x 2.6 cm.
IMPRESSION: Status post left hemicolectomy.

Destructive left sacral lesion, better evaluated on recent MRI.
Associated 2.8 x 3.5 cm soft tissue component anterior to the left
sacrum, mildly increased. This appearance remains worrisome for
metastasis.

2.6 cm hypoenhancing mass in the right upper kidney, essentially
new. This appearance may reflect infection, but if neoplastic, would
favor metastasis over renal cell carcinoma.

Trace right pleural effusion.

Cirrhosis. Splenomegaly. No abdominopelvic ascites.

Cholelithiasis, without associated inflammatory changes.

## 2020-03-25 MED ORDER — APIXABAN 5 MG PO TABS
5.0000 mg | ORAL_TABLET | Freq: Two times a day (BID) | ORAL | Status: DC
  Administered 2020-03-25: 5 mg via ORAL
  Filled 2020-03-25: qty 1

## 2020-03-25 MED ORDER — ENSURE ENLIVE PO LIQD
237.0000 mL | Freq: Two times a day (BID) | ORAL | Status: DC
  Administered 2020-03-25 – 2020-04-14 (×34): 237 mL via ORAL

## 2020-03-25 MED ORDER — OXYCODONE-ACETAMINOPHEN 5-325 MG PO TABS
1.0000 | ORAL_TABLET | ORAL | Status: DC | PRN
  Administered 2020-03-25 – 2020-04-11 (×26): 1 via ORAL
  Filled 2020-03-25 (×27): qty 1

## 2020-03-25 MED ORDER — HYDROMORPHONE HCL 1 MG/ML IJ SOLN
0.5000 mg | INTRAMUSCULAR | Status: DC | PRN
  Administered 2020-03-25: 0.5 mg via SUBCUTANEOUS
  Filled 2020-03-25: qty 0.5

## 2020-03-25 MED ORDER — SODIUM CHLORIDE 0.9 % IV SOLN
1.0000 g | Freq: Once | INTRAVENOUS | Status: AC
  Administered 2020-03-25: 1 g via INTRAVENOUS
  Filled 2020-03-25: qty 10

## 2020-03-25 MED ORDER — OXYCODONE-ACETAMINOPHEN 5-325 MG PO TABS
1.0000 | ORAL_TABLET | ORAL | Status: DC | PRN
  Administered 2020-03-25 (×2): 1 via ORAL
  Filled 2020-03-25 (×2): qty 1

## 2020-03-25 MED ORDER — FENTANYL CITRATE (PF) 100 MCG/2ML IJ SOLN
50.0000 ug | Freq: Once | INTRAMUSCULAR | Status: AC
  Administered 2020-03-25: 50 ug via INTRAVENOUS
  Filled 2020-03-25: qty 2

## 2020-03-25 MED ORDER — ONDANSETRON HCL 4 MG PO TABS
4.0000 mg | ORAL_TABLET | Freq: Four times a day (QID) | ORAL | Status: DC | PRN
  Administered 2020-04-07: 4 mg via ORAL
  Filled 2020-03-25: qty 1

## 2020-03-25 MED ORDER — GABAPENTIN 300 MG PO CAPS: 600.0000 mg | ORAL_CAPSULE | Freq: Every day | ORAL | Status: DC

## 2020-03-25 MED ORDER — DIPHENHYDRAMINE HCL 25 MG PO CAPS
50.0000 mg | ORAL_CAPSULE | Freq: Once | ORAL | Status: AC
  Administered 2020-03-25: 50 mg via ORAL
  Filled 2020-03-25: qty 2

## 2020-03-25 MED ORDER — IOHEXOL 300 MG/ML  SOLN
100.0000 mL | Freq: Once | INTRAMUSCULAR | Status: AC | PRN
  Administered 2020-03-25: 100 mL via INTRAVENOUS

## 2020-03-25 MED ORDER — DIPHENHYDRAMINE HCL 50 MG/ML IJ SOLN
50.0000 mg | Freq: Once | INTRAMUSCULAR | Status: AC
  Administered 2020-03-25: 50 mg via INTRAVENOUS
  Filled 2020-03-25: qty 1

## 2020-03-25 MED ORDER — PANTOPRAZOLE SODIUM 40 MG PO TBEC
40.0000 mg | DELAYED_RELEASE_TABLET | Freq: Every day | ORAL | Status: DC
  Administered 2020-03-25 – 2020-04-14 (×21): 40 mg via ORAL
  Filled 2020-03-25 (×21): qty 1

## 2020-03-25 MED ORDER — HEPARIN (PORCINE) 25000 UT/250ML-% IV SOLN
1100.0000 [IU]/h | INTRAVENOUS | Status: DC
  Administered 2020-03-26: 1100 [IU]/h via INTRAVENOUS
  Filled 2020-03-25: qty 250

## 2020-03-25 MED ORDER — POLYVINYL ALCOHOL 1.4 % OP SOLN: 1.0000 [drp] | Freq: Three times a day (TID) | OPHTHALMIC | Status: DC | PRN

## 2020-03-25 MED ORDER — HYDROCORTISONE NA SUCCINATE PF 250 MG IJ SOLR
200.0000 mg | Freq: Once | INTRAMUSCULAR | Status: AC
  Administered 2020-03-25: 200 mg via INTRAVENOUS
  Filled 2020-03-25: qty 200

## 2020-03-25 MED ORDER — ENOXAPARIN SODIUM 40 MG/0.4ML ~~LOC~~ SOLN: 40.0000 mg | SUBCUTANEOUS | Status: DC

## 2020-03-25 MED ORDER — INSULIN ASPART 100 UNIT/ML ~~LOC~~ SOLN
0.0000 [IU] | Freq: Three times a day (TID) | SUBCUTANEOUS | Status: DC
  Administered 2020-03-25 (×3): 3 [IU] via SUBCUTANEOUS
  Administered 2020-03-25 – 2020-03-26 (×2): 2 [IU] via SUBCUTANEOUS
  Administered 2020-03-26: 3 [IU] via SUBCUTANEOUS
  Administered 2020-03-26: 2 [IU] via SUBCUTANEOUS
  Administered 2020-03-27: 3 [IU] via SUBCUTANEOUS
  Administered 2020-03-27 – 2020-03-28 (×3): 2 [IU] via SUBCUTANEOUS
  Administered 2020-03-29 – 2020-03-30 (×6): 3 [IU] via SUBCUTANEOUS
  Administered 2020-03-31: 2 [IU] via SUBCUTANEOUS
  Administered 2020-03-31: 5 [IU] via SUBCUTANEOUS
  Administered 2020-03-31 (×2): 2 [IU] via SUBCUTANEOUS
  Administered 2020-04-01 (×2): 3 [IU] via SUBCUTANEOUS
  Administered 2020-04-01 – 2020-04-02 (×2): 2 [IU] via SUBCUTANEOUS
  Administered 2020-04-02: 3 [IU] via SUBCUTANEOUS
  Administered 2020-04-03 – 2020-04-04 (×3): 2 [IU] via SUBCUTANEOUS
  Administered 2020-04-05: 3 [IU] via SUBCUTANEOUS
  Administered 2020-04-06 – 2020-04-07 (×2): 2 [IU] via SUBCUTANEOUS
  Administered 2020-04-07: 3 [IU] via SUBCUTANEOUS
  Administered 2020-04-08 – 2020-04-09 (×4): 2 [IU] via SUBCUTANEOUS
  Administered 2020-04-09: 3 [IU] via SUBCUTANEOUS
  Administered 2020-04-10 – 2020-04-11 (×3): 2 [IU] via SUBCUTANEOUS
  Administered 2020-04-11: 3 [IU] via SUBCUTANEOUS
  Administered 2020-04-12: 2 [IU] via SUBCUTANEOUS
  Administered 2020-04-13 – 2020-04-14 (×3): 3 [IU] via SUBCUTANEOUS

## 2020-03-25 MED ORDER — ACETAMINOPHEN 650 MG RE SUPP: 650.0000 mg | Freq: Four times a day (QID) | RECTAL | Status: DC | PRN

## 2020-03-25 MED ORDER — DIPHENHYDRAMINE HCL 50 MG/ML IJ SOLN
50.0000 mg | Freq: Once | INTRAMUSCULAR | Status: AC
  Filled 2020-03-25: qty 1

## 2020-03-25 MED ORDER — SODIUM CHLORIDE 0.9 % IV SOLN: INTRAVENOUS | Status: DC

## 2020-03-25 MED ORDER — ZINC OXIDE 12.8 % EX OINT
TOPICAL_OINTMENT | Freq: Two times a day (BID) | CUTANEOUS | Status: DC
  Administered 2020-03-29 – 2020-04-08 (×6): 1 via TOPICAL
  Filled 2020-03-25 (×4): qty 56.7

## 2020-03-25 MED ORDER — ADULT MULTIVITAMIN W/MINERALS CH
1.0000 | ORAL_TABLET | Freq: Every day | ORAL | Status: DC
  Administered 2020-03-25 – 2020-04-14 (×20): 1 via ORAL
  Filled 2020-03-25 (×20): qty 1

## 2020-03-25 MED ORDER — INSULIN GLARGINE 100 UNIT/ML ~~LOC~~ SOLN
40.0000 [IU] | Freq: Every day | SUBCUTANEOUS | Status: DC
  Administered 2020-03-25 – 2020-04-09 (×16): 40 [IU] via SUBCUTANEOUS
  Administered 2020-04-10: 20 [IU] via SUBCUTANEOUS
  Administered 2020-04-11 – 2020-04-13 (×3): 40 [IU] via SUBCUTANEOUS
  Filled 2020-03-25 (×21): qty 0.4

## 2020-03-25 MED ORDER — GABAPENTIN 300 MG PO CAPS
600.0000 mg | ORAL_CAPSULE | Freq: Three times a day (TID) | ORAL | Status: DC
  Administered 2020-03-25 – 2020-04-14 (×60): 600 mg via ORAL
  Filled 2020-03-25 (×61): qty 2

## 2020-03-25 MED ORDER — SODIUM CHLORIDE 0.9 % IV SOLN
1.0000 g | INTRAVENOUS | Status: DC
  Administered 2020-03-26 – 2020-03-27 (×2): 1 g via INTRAVENOUS
  Filled 2020-03-25 (×2): qty 10

## 2020-03-25 MED ORDER — FLUTICASONE PROPIONATE 50 MCG/ACT NA SUSP: 1.0000 | Freq: Every day | NASAL | Status: DC | PRN

## 2020-03-25 MED ORDER — FLUCONAZOLE 150 MG PO TABS
150.0000 mg | ORAL_TABLET | Freq: Every day | ORAL | Status: DC
  Administered 2020-03-25 – 2020-03-26 (×2): 150 mg via ORAL
  Filled 2020-03-25 (×3): qty 1

## 2020-03-25 MED ORDER — ACETAMINOPHEN 325 MG PO TABS
650.0000 mg | ORAL_TABLET | Freq: Four times a day (QID) | ORAL | Status: DC | PRN
  Administered 2020-03-29: 650 mg via ORAL
  Filled 2020-03-25 (×2): qty 2

## 2020-03-25 MED ORDER — CARVEDILOL 3.125 MG PO TABS
3.1250 mg | ORAL_TABLET | Freq: Two times a day (BID) | ORAL | Status: DC
  Administered 2020-03-25 – 2020-04-14 (×41): 3.125 mg via ORAL
  Filled 2020-03-25 (×42): qty 1

## 2020-03-25 MED ORDER — NYSTATIN 100000 UNIT/GM EX POWD
Freq: Two times a day (BID) | CUTANEOUS | Status: DC
  Administered 2020-03-29 – 2020-04-08 (×4): 1 via TOPICAL
  Filled 2020-03-25 (×3): qty 15

## 2020-03-25 MED ORDER — POLYETHYLENE GLYCOL 3350 17 G PO PACK
17.0000 g | PACK | Freq: Every day | ORAL | Status: DC | PRN
  Administered 2020-03-29: 17 g via ORAL
  Filled 2020-03-25: qty 1

## 2020-03-25 MED ORDER — ONDANSETRON HCL 4 MG/2ML IJ SOLN: 4.0000 mg | Freq: Four times a day (QID) | INTRAMUSCULAR | Status: DC | PRN

## 2020-03-25 NOTE — Progress Notes (Signed)
8110 Pre medications Benadryl and SoluCortef given for upcoming CT scan with contrast. Patient made aware and she agreed.

## 2020-03-25 NOTE — Progress Notes (Addendum)
1100 CT scan dept has been notified that pre-med was given. Bed replaced with air mattress.

## 2020-03-25 NOTE — Progress Notes (Signed)
Initial Nutrition Assessment  DOCUMENTATION CODES:   Morbid obesity  INTERVENTION:   -Continue Ensure Enlive po BID, each supplement provides 350 kcal and 20 grams of protein -MVI with minerals daily  NUTRITION DIAGNOSIS:   Increased nutrient needs related to cancer and cancer related treatments as evidenced by estimated needs.  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   Pt unavailable at time of visit. No family present in room to provide further history.   Per H&P, pt was diagnosed with adenocarcinoma of the sigmoid colon and resection in 08/2019, however, did not follow-up with GI. She has been experiencing poor appetite, intermittent nausea, and weight loss over the past 1-2 weeks PTA.  Reviewed wt hx; wt has been stable over the past 8 months.   Medications reviewed and include diflucan.   Pt with poor oral intake and would benefit from nutrient dense supplement. One Ensure Enlive supplement provides 350 kcals, 20 grams protein, and 44-45 grams of carbohydrate vs one Glucerna shake supplement, which provides 220 kcals, 10 grams of protein, and 26 grams of carbohydrate. Given pt's hx of DM, RD will reassess adequacy of PO intake, CBGS, and adjust supplement regimen as appropriate at follow-up.   Lab Results  Component Value  Date   HGBA1C 6.4 (H) 03/25/2020   PTA DM medications are 40 units insulin glargine daily.   Labs reviewed: CBGS: [id number] (inpatient orders for glycemic control are 0-15 units insulin aspart TID with meals and at bedtime and 40 units insulin glargine daily).   Diet Order:   Diet Order            Diet NPO time specified Except for: Sips with Meds  Diet effective midnight           Diet heart healthy/carb modified Room service appropriate? Yes; Fluid consistency: Thin  Diet effective now                 EDUCATION NEEDS:   No education needs have been identified at this time  Skin:  Skin Assessment: Skin Integrity Issues: Skin Integrity Issues:: Stage II Stage II: sacrum  Last BM:  Unknown  Height:   Ht Readings from Last 1 Encounters:  03/24/20 5\' 2"  (1.575 m)    Weight:   Wt Readings from Last 1 Encounters:  03/24/20 111.9 kg    Ideal Body Weight:  50 kg  BMI:  Body mass index is 45.12 kg/m.  Estimated Nutritional Needs:   Kcal:  2000-2200  Protein:  110-125 grams  Fluid:  > 2 L    Loistine Chance, RD, LDN, [place] Registered Dietitian II Certified Diabetes Care and Education Specialist Please refer to [facility] for RD and/or RD on-call/weekend/after hours pager

## 2020-03-25 NOTE — Progress Notes (Signed)
PROGRESS NOTE  Tonya Dougherty XLK:[id number] DOB: 02-Jan-1951   PCP: Gay Filler, MD  Patient is from: Home.  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  She also have associated LLE weakness.  She reports urinary incontinence and bowel incontinence since she had colectomy.  She reports nausea and poor appetite.  She reports about 14 pounds weight loss but intentional.  She denies fever, injury or trauma or excessive night sweats.  She denies cough or shortness of breath.  In ED, vitals within normal.  Afebrile.  CMP and CBC with differential without significant finding.  Limited RVP screen negative.  UA with moderate LE and Hgb, nitrite and many bacteria.  Hip x-ray with mild osteoarthritis.  MRI thoracolumbar spine reveals sacral lesion on the left with probable early invasion of S1 and S2 neural foramina.  MRI also revealed degenerative changes at L4-5 with severe canal and bilateral subarticular stenosis with moderate L4 foraminal narrowing, left eccentric disc bulge and facet hypertrophy at L3-4 with resultant mild to moderate canal and left lateral recess stenosis and central to left subarticular disc protrusion at L1-2 with resultant mild to moderate spinal stenosis.   CT chest/abdomen/pelvis showed destructive left sacral lesion with associated 2.8 x 3.5 cm soft tissue component anterior to the left sacrum worrisome for metastasis, and 2.6 cm hypoenhancing mass in the right upper kidney.   Discussed finding with oncology, Dr. Alen Blew who suggested IR biopsy of the sacral lesion but not impressed with the renal lesion.   Interventional radiology and neurosurgery consulted.  Subjective: Seen and examined earlier this morning.  No major events overnight of this morning.  She says she was in severe pain last night until she got IV Dilaudid which helped with her pain.  Currently rates her pain 4/10, mainly posterolateral to her left knee and in her left backside.  Objective: Vitals:   03/25/20 0430 03/25/20 0728 03/25/20 0735 03/25/20 1410  BP: (!) 144/88 136/70  (!) 151/67  Pulse: 88 94  69  Resp: 20 18    Temp: 98.5 F (36.9 C) 98.6 F (37 C)  98.2 F (36.8 C)  TempSrc: Oral Oral  Oral  SpO2: 96% (!) 89% 93% 95%  Weight:      Height:       No intake or output data in the 24 hours ending 03/25/20 1450 Filed Weights   03/24/20 2140  Weight: 111.9 kg    Examination:  GENERAL: No apparent distress.  Nontoxic. HEENT: MMM.  Vision and hearing grossly intact.  NECK: Supple.  No apparent JVD.  RESP:  No IWOB.  Fair aeration bilaterally. CVS:  RRR. Heart sounds normal.  ABD/GI/GU: BS+. Abd soft, NTND.  MSK/EXT:  Moves extremities. No apparent deformity. No edema.  SKIN: no apparent skin lesion or wound NEURO: Awake and oriented appropriately.  Motor 4+/5 in BLE.  Light sensation intact.  Patellar reflex 2+ bilaterally. PSYCH: Calm. Normal affect.  Procedures:  None  Microbiology summarized: [id number] and influenza PCR nonreactive. Urine culture pending.  Assessment & Plan: Ambulatory dysfunction/LLE pain and weakness-seems radiculopathic.  MRI thoracolumbar spine reveals sacral lesion on the left with probable  early invasion of S1 and S2 neural foramina.  MRI also revealed degenerative changes at L4-5 with severe canal and bilateral subarticular stenosis with moderate L4 foraminal narrowing, left eccentric disc bulge and facet hypertrophy at L3-4 with resultant mild to moderate canal and left lateral recess stenosis and central to left subarticular disc protrusion at L1-2 with resultant mild to  moderate spinal stenosis.  -Neurosurgery PA, Kimberley Meyran recommended outpatient follow-up. -IR consulted for sacral biopsy-likely on 11/29 after Eliquis washout. -Pain control with as needed Tylenol, oxycodone and IV Dilaudid based on pain scale -Increase gabapentin to 600 mg 3 times daily -PT/OT-recommended SNF.  Sacral lesion concerning for neoplasm: MRI of lumbar spine revealed sacral lesions with invasion of S1 and S2.  No primary source on CT chest/abdomen/pelvis.  Reports urinary and fecal incontinence but this has been going on since she had colectomy. -Discussed with oncology, Dr. Alen Blew who recommended IR biopsy -Plan for image guided biopsy by IR on 11/29.  Adenocarcinoma of sigmoid colon s/p sigmoidectomy in April 2021.  Pathology with clean margins.  -Needs outpatient follow-up with GI  Acute cystitis with hematuria?  UA concerning but no acute UTI symptoms other than chronic urinary incontinence. -Complete short course of ceftriaxone for 3 days -Follow urine culture   Essential hypertension: Normotensive -Continue home medications  Controlled DM-2 with hyperglycemia: A1c 6.4%. Recent Labs  Lab 03/25/20 0449 03/25/20 0652 03/25/20 1143  GLUCAP 126* 136* 160*  -Continue current insulin regimen  GERD without esophagitis -Continue PPI  Tobacco use disorder: -Cessation counseling and nicotine patch   History of DVT (deep vein thrombosis): On Eliquis. -Hold Eliquis -Changed to IV heparin.  Candidal intertrigo -Continue nystatin powder and Diflucan  Anxiety and depression: Stable. -Continue home medications  Morbid obesity Body mass index is 45.12 kg/m.  -Encourage lifestyle change to lose weight.  -Could benefit from GLP-1 inhibitors given diabetes.    Stage II sacral pressure injury: POA Pressure Injury 03/25/20 Sacrum Right;Left Stage 2 -  Partial thickness loss of dermis presenting as a shallow open injury with a red, pink wound bed without  slough. (Active)  03/25/20 0430  Location: Sacrum  Location Orientation: Right;Left  Staging: Stage 2 -  Partial thickness loss of dermis presenting as a shallow open injury with a red, pink wound bed without slough.  Wound Description (Comments):   Present on Admission: Yes   DVT prophylaxis:   apixaban (ELIQUIS) tablet 5 mg  Code Status: Full code Family Communication: Patient and/or RN. Available if any question.  Status is: Observation  The patient remains OBS appropriate and will d/c before 2 midnights.  Dispo: The patient is from: Home              Anticipated d/c is to: SNF              Anticipated d/c date is: 3 days              Patient currently is not medically stable to d/c.       Consultants:  Neurosurgery over the phone Oncology over the phone Interventional radiology   Sch Meds:  Scheduled Meds: . apixaban  5 mg Oral BID  . carvedilol  3.125 mg Oral BID WC  . feeding supplement  237 mL Oral BID BM  . fluconazole  150 mg Oral Daily  . gabapentin  600 mg Oral QHS  . insulin aspart  0-15 Units Subcutaneous TID AC & HS  . insulin glargine  40 Units Subcutaneous  [id number]  . nystatin   Topical BID  . pantoprazole  40 mg Oral Daily  . Zinc Oxide   Topical BID   Continuous Infusions: . [START ON 03/26/2020] cefTRIAXone (ROCEPHIN)  IV     PRN Meds:.acetaminophen **OR** acetaminophen, fluticasone, ondansetron **OR** ondansetron (ZOFRAN) IV, oxyCODONE-acetaminophen, polyethylene glycol, polyvinyl alcohol  Antimicrobials: Anti-infectives (From admission, onward)   Start     Dose/Rate Route Frequency Ordered Stop   03/26/20 0000  cefTRIAXone (ROCEPHIN) 1 g in sodium chloride 0.9 % 100 mL IVPB        1 g 200 mL/hr over 30 Minutes Intravenous Every 24 hours 03/25/20 0333 03/29/20 2359   03/25/20 1000  fluconazole (DIFLUCAN) tablet 150 mg        150 mg Oral Daily 03/25/20 0556     03/25/20 0045  cefTRIAXone (ROCEPHIN) 1 g in sodium chloride 0.9 % 100 mL IVPB         1 g 200 mL/hr over 30 Minutes Intravenous  Once 03/25/20 0037 03/25/20 0158       I have personally reviewed the following labs and images: CBC: Recent Labs  Lab 03/24/20 2200 03/25/20 0508  WBC 8.4 8.3  NEUTROABS 5.6 6.1  HGB 13.0 13.4  HCT 42.4 44.5  MCV 94.6 94.7  PLT 183 170   BMP &GFR Recent Labs  Lab 03/24/20 2200 03/25/20 0508  NA 140 140  K 4.1 3.9  CL 106 104  CO2 22 23  GLUCOSE 171* 127*  BUN 27* 22  CREATININE 1.01* 0.96  CALCIUM 9.2 9.2  MG  --  2.1   Estimated Creatinine Clearance: 65.3 mL/min (by C-G formula based on SCr of 0.96 mg/dL). Liver & Pancreas: Recent Labs  Lab 03/24/20 2200 03/25/20 0508  AST 23 19  ALT 14 14  ALKPHOS 130* 123  BILITOT 0.8 1.3*  PROT 6.9 7.0  ALBUMIN 2.9* 2.8*   No results for input(s): LIPASE, AMYLASE in the last 168 hours. No results for input(s): AMMONIA in the last 168 hours. Diabetic: Recent Labs    03/25/20 0508  HGBA1C 6.4*   Recent Labs  Lab 03/25/20 0449 03/25/20 0652 03/25/20 1143  GLUCAP 126* 136* 160*   Cardiac Enzymes: No results for input(s): CKTOTAL, CKMB, CKMBINDEX, TROPONINI in the last 168 hours. No results for input(s): PROBNP in the last 8760 hours. Coagulation Profile: No results for input(s): INR, PROTIME in the last 168 hours. Thyroid Function Tests: No results for input(s): TSH, T4TOTAL, FREET4, T3FREE, THYROIDAB in the last 72 hours. Lipid Profile: No results for input(s): CHOL, HDL, LDLCALC, TRIG, CHOLHDL, LDLDIRECT in the last 72 hours. Anemia Panel: No results for input(s): VITAMINB12, FOLATE, FERRITIN, TIBC, IRON, RETICCTPCT in the last 72 hours. Urine analysis:    Component Value Date/Time   COLORURINE YELLOW 03/24/2020 2311   APPEARANCEUR CLOUDY (A) 03/24/2020 2311   LABSPEC 1.018 03/24/2020 2311   PHURINE 5.0 03/24/2020 2311   GLUCOSEU NEGATIVE 03/24/2020 2311   HGBUR MODERATE (A) 03/24/2020 2311   BILIRUBINUR NEGATIVE 03/24/2020 2311   [place]  03/24/2020 2311   PROTEINUR 30 (A) 03/24/2020 2311   NITRITE POSITIVE (A) 03/24/2020 2311   LEUKOCYTESUR MODERATE (A) 03/24/2020 2311   Sepsis Labs: Invalid input(s): PROCALCITONIN, [place]  Microbiology: Recent Results (from the past 240 hour(s))  Resp Panel by RT-PCR (Flu A&B, Covid) Nasopharyngeal Swab     Status: None   Collection Time: 03/25/20  2:36 AM   Specimen: Nasopharyngeal Swab; Nasopharyngeal(NP) swabs in vial transport medium  Result Value Ref Range Status   SARS Coronavirus 2 by RT PCR NEGATIVE NEGATIVE Final    Comment: (NOTE) SARS-CoV-2 target nucleic acids are NOT DETECTED.  The SARS-CoV-2 RNA is generally detectable in upper respiratory specimens during the acute phase of infection. The lowest concentration of SARS-CoV-2 viral copies this assay can detect is 138 copies/mL. A negative result does not preclude SARS-Cov-2 infection and should not be used as the sole basis for treatment or other patient management decisions. A negative result may occur with  improper specimen collection/handling, submission of specimen other than nasopharyngeal swab, presence of viral mutation(s) within the areas targeted by this assay, and inadequate number of viral copies(<138 copies/mL). A negative result must be combined with clinical observations, patient history, and epidemiological information. The expected result is Negative.  Fact Sheet for Patients:  EntrepreneurPulse.com.au  Fact Sheet for Healthcare Providers:  IncredibleEmployment.be  This test is no t yet approved or cleared by the [place] FDA and  has been authorized for detection and/or diagnosis of SARS-CoV-2 by FDA under an Emergency Use Authorization (EUA). This EUA will remain  in effect (meaning this test can be used) for the duration of the COVID-19 declaration under Section 564(b)(1) of the Act, 21 U.S.C.section 360bbb-3(b)(1), unless the authorization is  terminated  or revoked sooner.       Influenza A by PCR NEGATIVE NEGATIVE Final   Influenza B by PCR NEGATIVE NEGATIVE Final    Comment: (NOTE) The Xpert Xpress SARS-CoV-2/FLU/RSV plus assay is intended as an aid in the diagnosis of influenza from Nasopharyngeal swab specimens and should not be used as a sole basis for treatment. Nasal washings and aspirates are unacceptable for Xpert Xpress SARS-CoV-2/FLU/RSV testing.  Fact Sheet for Patients: EntrepreneurPulse.com.au  Fact Sheet for Healthcare Providers: IncredibleEmployment.be  This test is not yet approved or cleared by the [place] FDA and has been authorized for detection and/or diagnosis of SARS-CoV-2 by FDA under an Emergency Use Authorization (EUA). This EUA will remain in effect (meaning this test can be used) for the duration of the COVID-19 declaration under Section 564(b)(1) of the Act, 21 U.S.C. section 360bbb-3(b)(1), unless the authorization is terminated or revoked.  Performed at [facility], [place] [address], [place], [place] [address]     Radiology Studies: MR THORACIC SPINE WO CONTRAST  Result Date: 03/25/2020 CLINICAL DATA:  Initial evaluation for low back pain, cauda equina syndrome suspected. EXAM: MRI THORACIC AND LUMBAR SPINE WITHOUT CONTRAST TECHNIQUE: Multiplanar and multiecho pulse sequences of the thoracic and lumbar spine were obtained without intravenous contrast. COMPARISON:  Prior CT from 06/05/2019 FINDINGS: MRI THORACIC SPINE FINDINGS Alignment:  Examination degraded by motion artifact. Vertebral bodies normally aligned with preservation of the normal thoracic kyphosis. No listhesis or static subluxation. Vertebrae: Vertebral body height maintained without acute or chronic fracture. Bone marrow signal intensity mildly heterogeneous without worrisome osseous lesion. No evidence for metastatic disease within the thoracic spine. Subcentimeter benign  hemangioma noted within the T10 vertebral body. Cord:  Normal signal and morphology on this motion degraded exam. Paraspinal and other soft tissues: Paraspinous soft tissues demonstrate no acute finding. Probable scattered atelectatic changes noted within the partially visualized lungs. Visualized visceral structures otherwise grossly unremarkable. Disc levels: Normal expected for age multilevel disc desiccation with mild annular disc bulging, most notable at T4-5, T10-11, and T11-12. No significant spinal stenosis or evidence for cord deformity or impingement. Foramina remain patent. No neural impingement. MRI LUMBAR SPINE FINDINGS Segmentation:  Examination moderately  degraded by motion artifact. Standard segmentation. Lowest well-formed disc space labeled the L5-S1 level. Alignment: Levoscoliosis. Trace 2 mm retrolisthesis of L3 on L4, with trace 2 mm anterolisthesis of L4 on L5, chronic and facet mediated. Alignment otherwise normal with preservation of the normal lumbar lordosis. Vertebrae: There is an abnormal T2/STIR hyperintense lesion involving the central and left aspect of the sacrum, concerning for osseous metastatic disease (series 2, image 9). This appears to be new as compared to most recent available CT from 06/05/2019. Probable early invasion of the adjacent left S1 and possibly S2 neural foramina, partially visualized (series 5, image 26). Possible early invasion of the right S1 neural foramen noted as well. Probable extraosseous extension into the adjacent presacral space noted on sagittal view (series 2, image 13). No visible pathologic fracture. No other worrisome osseous lesions or evidence for metastatic disease seen within the lumbar spine. Underlying bone marrow signal intensity somewhat diffusely heterogeneous. Vertebral body height maintained without acute or chronic fracture. No other abnormal marrow edema. Conus medullaris and cauda equina: Conus extends to the L1-2 level. Conus and  cauda equina appear normal. Paraspinal and other soft tissues: Paraspinous soft tissues demonstrate no acute finding on this motion degraded exam. There is question of a heterogeneous lesion within the interpolar right kidney measuring 2.0 x 1.8 cm (series 4, image 5), not well evaluated on this motion degraded and noncontrast examination. No lesion seen at this location on prior CT. Remainder of the visualized visceral structures otherwise unremarkable. Disc levels: L1-2: Diffuse disc bulge with disc desiccation and intervertebral disc space narrowing. Superimposed central to left subarticular disc protrusion indents the ventral thecal sac (series 4, image 7). Resultant mild to moderate spinal stenosis without frank cord impingement. Foramina remain patent. L2-3: Mild diffuse disc bulge with disc desiccation. Disc bulge eccentric to the right. Mild bilateral facet hypertrophy. Resultant mild canal with right subarticular stenosis. Foramina remain patent. L3-4: Mild diffuse disc bulge with disc desiccation. Disc bulge eccentric to the left. Moderate bilateral facet hypertrophy. Resultant moderate canal with left lateral recess stenosis. Mild left L3 foraminal narrowing. Right neural foramina remains patent. L4-5: Trace anterolisthesis. Diffuse disc bulge with disc desiccation. Disc bulge asymmetric to the left. Superimposed small central to left foraminal disc extrusion with superior migration (series 18, image 8). Moderate bilateral facet and ligament flavum hypertrophy. Associated small joint effusion on the left. Resultant severe canal with bilateral subarticular stenosis. Thecal sac measures 5 mm in AP diameter at its most narrow point. Moderate left L4 foraminal stenosis. No significant right foraminal narrowing. L5-S1: Degenerative intervertebral disc space narrowing with diffuse disc bulge and disc desiccation. Reactive endplate change with marginal endplate osteophytic spurring. Broad posterior disc  osteophyte contacts the descending S1 nerve roots as they course through the lateral recesses bilaterally (series 4, image 23). Mild facet hypertrophy. Resultant mild to moderate bilateral lateral recess narrowing. Central canal remains patent. Mild right worse than left L5 foraminal stenosis. IMPRESSION: MR THORACIC SPINE IMPRESSION: 1. Motion degraded exam. 2. No acute abnormality within the thoracic spine. No evidence for significant disc pathology, stenosis, or evidence for neural impingement. No metastatic disease. MR LUMBAR SPINE IMPRESSION: 1. Abnormal T2/STIR hyperintense lesion involving the central and left aspect of the sacrum, concerning for osseous metastatic disease. Probable early invasion of the adjacent left S1 and S2 neural foramina, with extraosseous extension into the adjacent presacral space. No visible pathologic fracture. Finding could be further assessed with dedicated MRI of the sacrum/pelvis as clinically warranted. 2.  No other metastatic disease within the lumbar spine. 3. 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 4. Left eccentric disc bulge and facet hypertrophy at L3-4 with resultant mild to moderate canal and left lateral recess stenosis. 5. Central to left subarticular disc protrusion at L1-2 with resultant mild to moderate spinal stenosis. Electronically Signed   By: Jeannine Boga M.D.   On: 03/25/2020 01:13   MR LUMBAR SPINE WO CONTRAST  Result Date: 03/25/2020 CLINICAL DATA:  Initial evaluation for low back pain, cauda equina syndrome suspected. EXAM: MRI THORACIC AND LUMBAR SPINE WITHOUT CONTRAST TECHNIQUE: Multiplanar and multiecho pulse sequences of the thoracic and lumbar spine were obtained without intravenous contrast. COMPARISON:  Prior CT from 06/05/2019 FINDINGS: MRI THORACIC SPINE FINDINGS Alignment:  Examination degraded by motion artifact. Vertebral  bodies normally aligned with preservation of the normal thoracic kyphosis. No listhesis or static subluxation. Vertebrae: Vertebral body height maintained without acute or chronic fracture. Bone marrow signal intensity mildly heterogeneous without worrisome osseous lesion. No evidence for metastatic disease within the thoracic spine. Subcentimeter benign hemangioma noted within the T10 vertebral body. Cord:  Normal signal and morphology on this motion degraded exam. Paraspinal and other soft tissues: Paraspinous soft tissues demonstrate no acute finding. Probable scattered atelectatic changes noted within the partially visualized lungs. Visualized visceral structures otherwise grossly unremarkable. Disc levels: Normal expected for age multilevel disc desiccation with mild annular disc bulging, most notable at T4-5, T10-11, and T11-12. No significant spinal stenosis or evidence for cord deformity or impingement. Foramina remain patent. No neural impingement. MRI LUMBAR SPINE FINDINGS Segmentation:  Examination moderately degraded by motion artifact. Standard segmentation. Lowest well-formed disc space labeled the L5-S1 level. Alignment: Levoscoliosis. Trace 2 mm retrolisthesis of L3 on L4, with trace 2 mm anterolisthesis of L4 on L5, chronic and facet mediated. Alignment otherwise normal with preservation of the normal lumbar lordosis. Vertebrae: There is an abnormal T2/STIR hyperintense lesion involving the central and left aspect of the sacrum, concerning for osseous metastatic disease (series 2, image 9). This appears to be new as compared to most recent available CT from 06/05/2019. Probable early invasion of the adjacent left S1 and possibly S2 neural foramina, partially visualized (series 5, image 26). Possible early invasion of the right S1 neural foramen noted as well. Probable extraosseous extension into the adjacent presacral space noted on sagittal view (series 2, image 13). No visible pathologic fracture.  No other worrisome osseous lesions or evidence for metastatic disease seen within the lumbar spine. Underlying bone marrow signal intensity somewhat diffusely heterogeneous. Vertebral body height maintained without acute or chronic fracture. No other abnormal marrow edema. Conus medullaris and cauda equina: Conus extends to the L1-2 level. Conus and cauda equina appear normal. Paraspinal and other soft tissues: Paraspinous soft tissues demonstrate no acute finding on this motion degraded exam. There is question of a heterogeneous lesion within the interpolar right kidney measuring 2.0 x 1.8 cm (series 4, image 5), not well evaluated on this motion degraded and noncontrast examination. No lesion seen at this location on prior CT. Remainder of the visualized visceral structures otherwise unremarkable. Disc levels: L1-2: Diffuse disc bulge with disc desiccation and intervertebral disc space narrowing. Superimposed central to left subarticular disc protrusion indents the ventral thecal sac (series 4, image 7). Resultant mild to moderate spinal stenosis without frank cord impingement. Foramina remain patent. L2-3: Mild diffuse disc bulge with disc desiccation. Disc bulge eccentric to the  right. Mild bilateral facet hypertrophy. Resultant mild canal with right subarticular stenosis. Foramina remain patent. L3-4: Mild diffuse disc bulge with disc desiccation. Disc bulge eccentric to the left. Moderate bilateral facet hypertrophy. Resultant moderate canal with left lateral recess stenosis. Mild left L3 foraminal narrowing. Right neural foramina remains patent. L4-5: Trace anterolisthesis. Diffuse disc bulge with disc desiccation. Disc bulge asymmetric to the left. Superimposed small central to left foraminal disc extrusion with superior migration (series 18, image 8). Moderate bilateral facet and ligament flavum hypertrophy. Associated small joint effusion on the left. Resultant severe canal with bilateral subarticular  stenosis. Thecal sac measures 5 mm in AP diameter at its most narrow point. Moderate left L4 foraminal stenosis. No significant right foraminal narrowing. L5-S1: Degenerative intervertebral disc space narrowing with diffuse disc bulge and disc desiccation. Reactive endplate change with marginal endplate osteophytic spurring. Broad posterior disc osteophyte contacts the descending S1 nerve roots as they course through the lateral recesses bilaterally (series 4, image 23). Mild facet hypertrophy. Resultant mild to moderate bilateral lateral recess narrowing. Central canal remains patent. Mild right worse than left L5 foraminal stenosis. IMPRESSION: MR THORACIC SPINE IMPRESSION: 1. Motion degraded exam. 2. No acute abnormality within the thoracic spine. No evidence for significant disc pathology, stenosis, or evidence for neural impingement. No metastatic disease. MR LUMBAR SPINE IMPRESSION: 1. Abnormal T2/STIR hyperintense lesion involving the central and left aspect of the sacrum, concerning for osseous metastatic disease. Probable early invasion of the adjacent left S1 and S2 neural foramina, with extraosseous extension into the adjacent presacral space. No visible pathologic fracture. Finding could be further assessed with dedicated MRI of the sacrum/pelvis as clinically warranted. 2. No other metastatic disease within the lumbar spine. 3. 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 4. Left eccentric disc bulge and facet hypertrophy at L3-4 with resultant mild to moderate canal and left lateral recess stenosis. 5. Central to left subarticular disc protrusion at L1-2 with resultant mild to moderate spinal stenosis. Electronically Signed   By: Jeannine Boga M.D.   On: 03/25/2020 01:13   CT CHEST ABDOMEN PELVIS W CONTRAST  Result Date: 03/25/2020 CLINICAL DATA:  Left sacral lesion on MR. History of  sigmoid colon cancer. EXAM: CT CHEST, ABDOMEN, AND PELVIS WITH CONTRAST TECHNIQUE: Multidetector CT imaging of the chest, abdomen and pelvis was performed following the standard protocol during bolus administration of intravenous contrast. CONTRAST:  181mL OMNIPAQUE IOHEXOL 300 MG/ML  SOLN COMPARISON:  CT chest abdomen pelvis dated 06/05/2019. MRI thoracolumbar spine dated 03/25/2020. FINDINGS: CT CHEST FINDINGS Cardiovascular: Heart is normal in size.  No pericardial effusion. No evidence of thoracic aortic aneurysm. Mediastinum/Nodes: No suspicious mediastinal lymphadenopathy. Visualized thyroid is enlarged/heterogeneous. Lungs/Pleura: Trace right pleural effusion. No suspicious pulmonary nodules. Mild scarring/fibrosis in the right upper lobe. No pleural effusion or pneumothorax. Musculoskeletal: No focal osseous lesions. CT ABDOMEN PELVIS FINDINGS Hepatobiliary: Cirrhosis.  No suspicious/enhancing hepatic lesions. Numerous layering gallstones (series 4/image 64), without associated inflammatory changes. Pancreas: Within normal limits. Spleen: Enlarged, measuring 15.3 cm in maximal craniocaudal dimension. Adrenals/Urinary Tract: Adrenal glands are within normal limits. 2.6 cm hypoenhancing mass in the right upper kidney (series 4/image 65), essentially new. This appearance may reflect infection, but if neoplastic, would favor metastasis over renal cell carcinoma. Left kidney is within normal limits.  No hydronephrosis. Bladder is within normal limits. Stomach/Bowel: Stomach is within normal limits. No evidence of bowel obstruction. Appendix is not discretely visualized. Status  post left hemicolectomy with suture line in the lower abdomen (series 4/image 95). Vascular/Lymphatic: No evidence of abdominal aortic aneurysm. Atherosclerotic calcifications of the abdominal aorta and branch vessels. No suspicious abdominopelvic lymphadenopathy. Reproductive: Uterus is within normal limits. Left ovary is within normal  limits. Right ovary is notable for a 3.2 cm cyst/follicle, previously 3.5 cm. Other: No abdominopelvic ascites. Musculoskeletal: Destructive left sacral lesion (series 4/image 100), better evaluated on recent MRI. Associated 2.8 x 3.5 cm soft tissue component anterior to the left sacrum (series 4/image 105), previously 2.1 x 2.6 cm. IMPRESSION: Status post left hemicolectomy. Destructive left sacral lesion, better evaluated on recent MRI. Associated 2.8 x 3.5 cm soft tissue component anterior to the left sacrum, mildly increased. This appearance remains worrisome for metastasis. 2.6 cm hypoenhancing mass in the right upper kidney, essentially new. This appearance may reflect infection, but if neoplastic, would favor metastasis over renal cell carcinoma. Trace right pleural effusion. Cirrhosis. Splenomegaly. No abdominopelvic ascites. Cholelithiasis, without associated inflammatory changes. Electronically Signed   By: Julian Hy M.D.   On: 03/25/2020 13:35   DG Knee Complete 4 Views Left  Result Date: 03/24/2020 CLINICAL DATA:  Pain EXAM: LEFT KNEE - COMPLETE 4+ VIEW COMPARISON:  None. FINDINGS: There are moderate tricompartmental degenerative changes, greatest within the medial compartment. There is no significant joint effusion. No acute displaced fracture or dislocation. IMPRESSION: Moderate tricompartmental degenerative changes, greatest within the medial compartment. Electronically Signed   By: Constance Holster M.D.   On: 03/24/2020 23:35   DG Hip Unilat W or Wo Pelvis 2-3 Views Left  Result Date: 03/24/2020 CLINICAL DATA:  Pain status post fall EXAM: DG HIP (WITH OR WITHOUT PELVIS) 2-3V LEFT COMPARISON:  None. FINDINGS: There is mild bilateral hip osteoarthritis. There is no acute displaced fracture or dislocation. There are calcifications projecting over the patient's pelvis that are favored to represent phleboliths. IMPRESSION: Mild bilateral hip osteoarthritis. No acute displaced fracture  or dislocation. Electronically Signed   By: Constance Holster M.D.   On: 03/24/2020 23:32   No charge service.  Admission after midnight.   Jourdyn Hasler T. [place]  If 7PM-7AM, please contact night-coverage [email or web address] 03/25/2020, 2:50 PM

## 2020-03-25 NOTE — Progress Notes (Signed)
Patient arrived on the floor from the ED at 0428 via stretcher. Patient transferred to bed, cleaned up, skin check completed, and vital signs taken. Risk bracelets applied and admission assessment completed to the best of my ability. Patient was complaining of back pain so PO pain med was given as ordered. Telemetry box 07 placed on patient and verified with Blanch Media, Therapist, sports. Will review orders and proceed with POC.

## 2020-03-25 NOTE — H&P (Addendum)
History and Physical    Tonya Dougherty ZOX:[id number] DOB: [birth year] DOA: 03/24/2020  PCP: Gay Filler, MD  Patient coming from: Home   Chief Complaint:  Chief Complaint  Patient presents with  . Weakness  . 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  Patient explains that since her diagnosis with adenocarcinoma of the sigmoid colon and resection in 07/2019 she was instructed to follow-up with gastroenterology for continued outpatient care but never scheduled or attended an appointment with them.  Over the summer, the patient began to develop pain in the left buttock and left hip.  Patient had presented to her primary care provider's office over the summer where she was diagnosed with piriformis syndrome.  Patient underwent several attempts at therapy in the weeks to months that followed including attempts at management with nonopiate-based analgesics, gabapentin and multiple courses of outpatient steroids without improvement.  Patient was eventually placed on oral oxycodone for worsening pain and referred to [facility] for steroid injection.  An MRI of the lumbar spine was eventually ordered however the patient did not have it done.  As the months progressed, patient's left buttock and left hip pain became severe in intensity, sharp in quality and frequently radiated down to the foot.  As the patient's symptoms continued to worsen the patient has experienced weakness of the left lower extremity.  The weakness of the left lower extremity began in August but approximately 1-1/2 weeks prior to her presentation today this became  progressively more severe.  At this point the patient began to develop increasing difficulty with ambulation.  Patient is now also complaining of poor appetite and intermittent nausea over the past 2 weeks with a small amount of unintentional weight loss over the span of time.  Patient does complain of occasional urinary incontinence however this is not consistent.  Patient has explained that she has lost control of her bowels on occasion over the past several months however this is not consistent as well.  Patient's severe pain and progressively worsening weakness continued to persist until she eventually presented to [facility] emergency department for evaluation.  Upon evaluation in the emergency department, MRI of the thoracolumbar spine reveals hyperintense lesion involving the central and left aspect of the sacrum concerning for osseous metastatic disease with probable early invasion of S1 and L2 neural foramina.  Additionally, urinalysis is suggestive of urinary tract infection.  Patient has been provided with a dose of intravenous ceftriaxone and hospitalist group has been called to assess the patient for admission to the hospital.  Review of Systems:   Review of Systems  Musculoskeletal: Positive for back pain and joint pain.  Neurological: Positive for focal weakness and weakness.  All other systems reviewed and are negative.   Past Medical History:  Diagnosis Date  . Allergic rhinitis   . Anxiety   . Arthritis   . Bell's palsy   . Cataract   . Cirrhosis ([place])   . Coarse tremors   . Colon cancer ([place])   . Diabetes mellitus without complication ([place])   . Diverticulosis   . DVT (deep venous thrombosis) ([place])   . GERD (gastroesophageal reflux disease)   . GI  bleed   . Hepatitis B   . History of blood transfusion 05/2019  . History of panic attacks   . Hypertension   . Insomnia   . Iron deficiency anemia   . MDD (major depressive disorder)   . Morbid obesity ([place])    . Neuropathy   . Portal vein thrombosis   . Psoriasis   . Renal insufficiency     Past Surgical History:  Procedure Laterality Date  . ANKLE SURGERY Right   . CATARACT EXTRACTION, BILATERAL    . COLONOSCOPY WITH ESOPHAGOGASTRODUODENOSCOPY (EGD)  05/2019  . FLEXIBLE SIGMOIDOSCOPY N/A 08/14/2019   Procedure: FLEXIBLE SIGMOIDOSCOPY;  Surgeon: Ileana Roup, MD;  Location: [facility];  Service: General;  Laterality: N/A;     reports that she has been smoking cigarettes. She has been smoking about 0.50 packs per day. She has never used smokeless tobacco. She reports previous drug use. She reports that she does not drink alcohol.  Allergies  Allergen Reactions  . Xarelto [Rivaroxaban] Rash    Severe rash with itching  . Erythromycin     Oral Thrush  . Lipitor [Atorvastatin]     Leg cramps  . Pravachol [Pravastatin]     Leg muscle cramps  . Ivp Dye [Iodinated Diagnostic Agents] Rash    Family History  Problem Relation Age of Onset  . Kidney failure Mother   . Diabetes Mother   . Obesity Mother   . Hypertension Mother   . Cirrhosis Father   . Hypertension Sister   . Kidney failure Brother   . Hypertension Brother   . Hypertension Sister   . Alcoholism Brother   . Other Brother        MRSA  . Cirrhosis Daughter   . Drug abuse Daughter      Prior to Admission medications   Medication Sig Start Date End Date Taking? Authorizing Provider  acetaminophen (TYLENOL) 500 MG tablet Take 1,000 mg by mouth every 6 (six) hours as needed for mild pain.     [provider]  albuterol (VENTOLIN HFA) 108 (90 Base) MCG/ACT inhaler Inhale 2 puffs into the lungs every 6 (six) hours as needed for wheezing or shortness of breath.  07/30/18 11/25/19  [provider]  B-D ULTRAFINE III SHORT PEN 31G X 8 MM MISC USE AS DIRECTED TO INJECT LANTUS DAILY 10/24/17   [provider]  Calcium Carb-Cholecalciferol (CALCIUM 600+D) 600-800 MG-UNIT TABS Take 2 tablets by mouth at  bedtime.    [provider]  carboxymethylcellulose (REFRESH PLUS) 0.5 % SOLN Place 1 drop into both eyes 3 (three) times daily as needed (dry/irritated eyes).    [provider]  carvedilol (COREG) 3.125 MG tablet Take 3.125 mg by mouth 2 (two) times daily with a meal.    [provider]  cephALEXin (KEFLEX) 500 MG capsule Take 1 capsule (500 mg total) by mouth 4 (four) times daily. Patient not taking: Reported on 07/29/2019 06/11/19   Raylene Everts, MD  citalopram (CELEXA) 10 MG tablet Take 30 mg by mouth at bedtime.     [provider]  ELIQUIS 5 MG TABS tablet TAKE 1 TABLET BY MOUTH TWICE A DAY 03/10/20   Waynetta Sandy, MD  esomeprazole ([place]) 20 MG capsule Take by mouth. 03/18/18 03/18/19  [provider]  ferrous sulfate 325 (65 FE) MG tablet Take 325 mg by mouth daily with breakfast.    [provider]  fluticasone (FLONASE) 50 MCG/ACT nasal spray Place  1-2 sprays into both nostrils daily as needed (allergies.).  10/01/18   [provider]  furosemide (LASIX) 20 MG tablet Take 20 mg by mouth every evening.     [provider]  gabapentin (NEURONTIN) 300 MG capsule Take 600 mg by mouth at bedtime. 06/02/19   [provider]  Hydrocortisone, Perianal, 1 % CREA Apply 1 application topically 2 (two) times daily as needed (irritation).  06/26/19   [provider]  Insulin Glargine (BASAGLAR KWIKPEN) 100 UNIT/ML SOPN Inject 40 Units into the skin at bedtime. 10/11/17   [provider]  lidocaine (LIDODERM) 5 % Place 1 patch onto the skin at bedtime. Apply to affected area for 12 hours only each day (then remove patch) 05/11/19   [provider]  Magnesium 100 MG TABS Take 100 mg by mouth at bedtime. Magnesium Citrate    [provider]  Melatonin 5 MG CAPS Take 15 mg by mouth at bedtime.  04/29/18   [provider]  omeprazole (PRILOSEC) 20 MG capsule Take 20 mg by  mouth at bedtime.    [provider]  permethrin (ELIMITE) 5 % cream Apply 1 application topically daily as needed (skin irritation.).  06/26/19   [provider]  polyethylene glycol powder (GLYCOLAX/MIRALAX) 17 GM/SCOOP powder Take 17 g by mouth daily as needed (constipation.).     [provider]  predniSONE (DELTASONE) 20 MG tablet Take 2 tablets daily with breakfast. 11/25/19   Jaynee Eagles, PA-C  silver sulfADIAZINE (SILVADENE) 1 % cream Apply 1 application topically daily. Patient taking differently: Apply 1 application topically daily as needed (skin irritation.).  06/11/19   Raylene Everts, MD  spironolactone (ALDACTONE) 50 MG tablet Take 50 mg by mouth every evening.  09/03/18   [provider]  tiZANidine (ZANAFLEX) 4 MG tablet Take 1 tablet (4 mg total) by mouth at bedtime. 11/25/19   Jaynee Eagles, PA-C  diphenhydrAMINE (BENADRYL) 50 MG tablet Take 1 tablet by mouth at 1pm prior to ct scan 06/01/19 06/11/19  Irene Shipper, MD    Physical Exam: Vitals:   03/25/20 0100 03/25/20 0145 03/25/20 0206 03/25/20 0245  BP: (!) 136/56 138/76 (!) 107/55 134/66  Pulse: 87 92 83 90  Resp: 15  16 18   Temp:      SpO2: 95% 97% 96% 97%  Weight:      Height:        Constitutional: Acute alert and oriented x3, patient is in distress due to left lower extremity pain. Skin: Patient exhibits notable inflamed hyperemic skin in the folds of the groin, abdomen and breasts with some evidence of skin breakdown of the buttocks and gluteal cleft concerning for ongoing candidal intertrigo.  Some notable hyperemia of the skin of the anterior left leg. notable poor skin turgor. Eyes: Pupils are equally reactive to light.  No evidence of scleral icterus or conjunctival pallor.  ENMT: Moist mucous membranes noted.  Posterior pharynx clear of any exudate or lesions.   Neck: normal, supple, no masses, no thyromegaly.  No evidence of jugular venous distension.   Respiratory: clear to  auscultation bilaterally, no wheezing, no crackles. Normal respiratory effort. No accessory muscle use.  Cardiovascular: Regular rate and rhythm, no murmurs / rubs / gallops. No extremity edema. 2+ pedal pulses. No carotid bruits.  Chest:   Nontender without crepitus or deformity.   Back:   Nontender without crepitus or deformity. Abdomen: Abdomen is soft and nontender.  No evidence of intra-abdominal masses.  Positive bowel sounds noted in all quadrants.   Musculoskeletal: Significant pain with both passive and active range of motion of the left hip. No evidence of joint deformities of the upper and lower extremities.  Normal muscle tone.  Neurologic: Notable substantial weakness of the distal and proximal muscle groups of the left lower extremity.  Sensation is grossly intact.  CN 2-12 grossly intact.   Patient is following all commands.  Patient is responsive to verbal stimuli.   Psychiatric: Patient exhibits normal mood with appropriate affect.  Patient seems to possess insight as to their current situation.     Labs on Admission: I have personally reviewed following labs and imaging studies -   CBC: Recent Labs  Lab 03/24/20 2200  WBC 8.4  NEUTROABS 5.6  HGB 13.0  HCT 42.4  MCV 94.6  PLT 672   Basic Metabolic Panel: Recent Labs  Lab 03/24/20 2200  NA 140  K 4.1  CL 106  CO2 22  GLUCOSE 171*  BUN 27*  CREATININE 1.01*  CALCIUM 9.2   GFR: Estimated Creatinine Clearance: 62.1 mL/min (A) (by C-G formula based on SCr of 1.01 mg/dL (H)). Liver Function Tests: Recent Labs  Lab 03/24/20 2200  AST 23  ALT 14  ALKPHOS 130*  BILITOT 0.8  PROT 6.9  ALBUMIN 2.9*   No results for input(s): LIPASE, AMYLASE in the last 168 hours. No results for input(s): AMMONIA in the last 168 hours. Coagulation Profile: No results for input(s): INR, PROTIME in the last 168 hours. Cardiac Enzymes: No results for input(s): CKTOTAL, CKMB, CKMBINDEX, TROPONINI in the last 168 hours. BNP  (last 3 results) No results for input(s): PROBNP in the last 8760 hours. HbA1C: No results for input(s): HGBA1C in the last 72 hours. CBG: No results for input(s): GLUCAP in the last 168 hours. Lipid Profile: No results for input(s): CHOL, HDL, LDLCALC, TRIG, CHOLHDL, LDLDIRECT in the last 72 hours. Thyroid Function Tests: No results for input(s): TSH, T4TOTAL, FREET4, T3FREE, THYROIDAB in the last 72 hours. Anemia Panel: No results for input(s): VITAMINB12, FOLATE, FERRITIN, TIBC, IRON, RETICCTPCT in the last 72 hours. Urine analysis:    Component Value Date/Time   COLORURINE YELLOW 03/24/2020 2311   APPEARANCEUR CLOUDY (A) 03/24/2020 2311   LABSPEC 1.018 03/24/2020 2311   PHURINE 5.0 03/24/2020 2311   GLUCOSEU NEGATIVE 03/24/2020 2311   HGBUR MODERATE (A) 03/24/2020 2311   BILIRUBINUR NEGATIVE 03/24/2020 2311   KETONESUR NEGATIVE 03/24/2020 2311   PROTEINUR 30 (A) 03/24/2020 2311   NITRITE POSITIVE (A) 03/24/2020 2311   LEUKOCYTESUR MODERATE (A) 03/24/2020 2311    Radiological Exams on Admission - Personally Reviewed: MR THORACIC SPINE WO CONTRAST  Result Date: 03/25/2020 CLINICAL DATA:  Initial evaluation for low back pain, cauda equina syndrome suspected. EXAM: MRI THORACIC AND LUMBAR SPINE WITHOUT CONTRAST TECHNIQUE: Multiplanar and multiecho pulse sequences of the thoracic and lumbar spine were obtained without intravenous contrast. COMPARISON:  Prior CT from 06/05/2019 FINDINGS: MRI THORACIC SPINE FINDINGS Alignment:  Examination degraded by motion artifact. Vertebral bodies normally aligned with preservation of the normal thoracic kyphosis. No listhesis or static subluxation. Vertebrae: Vertebral body height maintained without acute or chronic fracture. Bone marrow signal intensity mildly heterogeneous without worrisome osseous lesion. No evidence for metastatic disease within the thoracic spine. Subcentimeter benign hemangioma noted within the T10 vertebral body. Cord:   Normal signal and morphology on this motion degraded exam. Paraspinal and other soft tissues: Paraspinous soft tissues demonstrate no acute finding. Probable scattered atelectatic  changes noted within the partially visualized lungs. Visualized visceral structures otherwise grossly unremarkable. Disc levels: Normal expected for age multilevel disc desiccation with mild annular disc bulging, most notable at T4-5, T10-11, and T11-12. No significant spinal stenosis or evidence for cord deformity or impingement. Foramina remain patent. No neural impingement. MRI LUMBAR SPINE FINDINGS Segmentation:  Examination moderately degraded by motion artifact. Standard segmentation. Lowest well-formed disc space labeled the L5-S1 level. Alignment: Levoscoliosis. Trace 2 mm retrolisthesis of L3 on L4, with trace 2 mm anterolisthesis of L4 on L5, chronic and facet mediated. Alignment otherwise normal with preservation of the normal lumbar lordosis. Vertebrae: There is an abnormal T2/STIR hyperintense lesion involving the central and left aspect of the sacrum, concerning for osseous metastatic disease (series 2, image 9). This appears to be new as compared to most recent available CT from 06/05/2019. Probable early invasion of the adjacent left S1 and possibly S2 neural foramina, partially visualized (series 5, image 26). Possible early invasion of the right S1 neural foramen noted as well. Probable extraosseous extension into the adjacent presacral space noted on sagittal view (series 2, image 13). No visible pathologic fracture. No other worrisome osseous lesions or evidence for metastatic disease seen within the lumbar spine. Underlying bone marrow signal intensity somewhat diffusely heterogeneous. Vertebral body height maintained without acute or chronic fracture. No other abnormal marrow edema. Conus medullaris and cauda equina: Conus extends to the L1-2 level. Conus and cauda equina appear normal. Paraspinal and other soft  tissues: Paraspinous soft tissues demonstrate no acute finding on this motion degraded exam. There is question of a heterogeneous lesion within the interpolar right kidney measuring 2.0 x 1.8 cm (series 4, image 5), not well evaluated on this motion degraded and noncontrast examination. No lesion seen at this location on prior CT. Remainder of the visualized visceral structures otherwise unremarkable. Disc levels: L1-2: Diffuse disc bulge with disc desiccation and intervertebral disc space narrowing. Superimposed central to left subarticular disc protrusion indents the ventral thecal sac (series 4, image 7). Resultant mild to moderate spinal stenosis without frank cord impingement. Foramina remain patent. L2-3: Mild diffuse disc bulge with disc desiccation. Disc bulge eccentric to the right. Mild bilateral facet hypertrophy. Resultant mild canal with right subarticular stenosis. Foramina remain patent. L3-4: Mild diffuse disc bulge with disc desiccation. Disc bulge eccentric to the left. Moderate bilateral facet hypertrophy. Resultant moderate canal with left lateral recess stenosis. Mild left L3 foraminal narrowing. Right neural foramina remains patent. L4-5: Trace anterolisthesis. Diffuse disc bulge with disc desiccation. Disc bulge asymmetric to the left. Superimposed small central to left foraminal disc extrusion with superior migration (series 18, image 8). Moderate bilateral facet and ligament flavum hypertrophy. Associated small joint effusion on the left. Resultant severe canal with bilateral subarticular stenosis. Thecal sac measures 5 mm in AP diameter at its most narrow point. Moderate left L4 foraminal stenosis. No significant right foraminal narrowing. L5-S1: Degenerative intervertebral disc space narrowing with diffuse disc bulge and disc desiccation. Reactive endplate change with marginal endplate osteophytic spurring. Broad posterior disc osteophyte contacts the descending S1 nerve roots as they  course through the lateral recesses bilaterally (series 4, image 23). Mild facet hypertrophy. Resultant mild to moderate bilateral lateral recess narrowing. Central canal remains patent. Mild right worse than left L5 foraminal stenosis. IMPRESSION: MR THORACIC SPINE IMPRESSION: 1. Motion degraded exam. 2. No acute abnormality within the thoracic spine. No evidence for significant disc pathology, stenosis, or evidence for neural impingement. No metastatic disease. MR LUMBAR SPINE  IMPRESSION: 1. Abnormal T2/STIR hyperintense lesion involving the central and left aspect of the sacrum, concerning for osseous metastatic disease. Probable early invasion of the adjacent left S1 and S2 neural foramina, with extraosseous extension into the adjacent presacral space. No visible pathologic fracture. Finding could be further assessed with dedicated MRI of the sacrum/pelvis as clinically warranted. 2. No other metastatic disease within the lumbar spine. 3. 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 4. Left eccentric disc bulge and facet hypertrophy at L3-4 with resultant mild to moderate canal and left lateral recess stenosis. 5. Central to left subarticular disc protrusion at L1-2 with resultant mild to moderate spinal stenosis. Electronically Signed   By: Jeannine Boga M.D.   On: 03/25/2020 01:13   MR LUMBAR SPINE WO CONTRAST  Result Date: 03/25/2020 CLINICAL DATA:  Initial evaluation for low back pain, cauda equina syndrome suspected. EXAM: MRI THORACIC AND LUMBAR SPINE WITHOUT CONTRAST TECHNIQUE: Multiplanar and multiecho pulse sequences of the thoracic and lumbar spine were obtained without intravenous contrast. COMPARISON:  Prior CT from 06/05/2019 FINDINGS: MRI THORACIC SPINE FINDINGS Alignment:  Examination degraded by motion artifact. Vertebral bodies normally aligned with preservation of the normal  thoracic kyphosis. No listhesis or static subluxation. Vertebrae: Vertebral body height maintained without acute or chronic fracture. Bone marrow signal intensity mildly heterogeneous without worrisome osseous lesion. No evidence for metastatic disease within the thoracic spine. Subcentimeter benign hemangioma noted within the T10 vertebral body. Cord:  Normal signal and morphology on this motion degraded exam. Paraspinal and other soft tissues: Paraspinous soft tissues demonstrate no acute finding. Probable scattered atelectatic changes noted within the partially visualized lungs. Visualized visceral structures otherwise grossly unremarkable. Disc levels: Normal expected for age multilevel disc desiccation with mild annular disc bulging, most notable at T4-5, T10-11, and T11-12. No significant spinal stenosis or evidence for cord deformity or impingement. Foramina remain patent. No neural impingement. MRI LUMBAR SPINE FINDINGS Segmentation:  Examination moderately degraded by motion artifact. Standard segmentation. Lowest well-formed disc space labeled the L5-S1 level. Alignment: Levoscoliosis. Trace 2 mm retrolisthesis of L3 on L4, with trace 2 mm anterolisthesis of L4 on L5, chronic and facet mediated. Alignment otherwise normal with preservation of the normal lumbar lordosis. Vertebrae: There is an abnormal T2/STIR hyperintense lesion involving the central and left aspect of the sacrum, concerning for osseous metastatic disease (series 2, image 9). This appears to be new as compared to most recent available CT from 06/05/2019. Probable early invasion of the adjacent left S1 and possibly S2 neural foramina, partially visualized (series 5, image 26). Possible early invasion of the right S1 neural foramen noted as well. Probable extraosseous extension into the adjacent presacral space noted on sagittal view (series 2, image 13). No visible pathologic fracture. No other worrisome osseous lesions or evidence for  metastatic disease seen within the lumbar spine. Underlying bone marrow signal intensity somewhat diffusely heterogeneous. Vertebral body height maintained without acute or chronic fracture. No other abnormal marrow edema. Conus medullaris and cauda equina: Conus extends to the L1-2 level. Conus and cauda equina appear normal. Paraspinal and other soft tissues: Paraspinous soft tissues demonstrate no acute finding on this motion degraded exam. There is question of a heterogeneous lesion within the interpolar right kidney measuring 2.0 x 1.8 cm (series 4, image 5), not well evaluated on this motion degraded and noncontrast examination. No lesion seen at this location on prior CT. Remainder of the visualized visceral  structures otherwise unremarkable. Disc levels: L1-2: Diffuse disc bulge with disc desiccation and intervertebral disc space narrowing. Superimposed central to left subarticular disc protrusion indents the ventral thecal sac (series 4, image 7). Resultant mild to moderate spinal stenosis without frank cord impingement. Foramina remain patent. L2-3: Mild diffuse disc bulge with disc desiccation. Disc bulge eccentric to the right. Mild bilateral facet hypertrophy. Resultant mild canal with right subarticular stenosis. Foramina remain patent. L3-4: Mild diffuse disc bulge with disc desiccation. Disc bulge eccentric to the left. Moderate bilateral facet hypertrophy. Resultant moderate canal with left lateral recess stenosis. Mild left L3 foraminal narrowing. Right neural foramina remains patent. L4-5: Trace anterolisthesis. Diffuse disc bulge with disc desiccation. Disc bulge asymmetric to the left. Superimposed small central to left foraminal disc extrusion with superior migration (series 18, image 8). Moderate bilateral facet and ligament flavum hypertrophy. Associated small joint effusion on the left. Resultant severe canal with bilateral subarticular stenosis. Thecal sac measures 5 mm in AP diameter at  its most narrow point. Moderate left L4 foraminal stenosis. No significant right foraminal narrowing. L5-S1: Degenerative intervertebral disc space narrowing with diffuse disc bulge and disc desiccation. Reactive endplate change with marginal endplate osteophytic spurring. Broad posterior disc osteophyte contacts the descending S1 nerve roots as they course through the lateral recesses bilaterally (series 4, image 23). Mild facet hypertrophy. Resultant mild to moderate bilateral lateral recess narrowing. Central canal remains patent. Mild right worse than left L5 foraminal stenosis. IMPRESSION: MR THORACIC SPINE IMPRESSION: 1. Motion degraded exam. 2. No acute abnormality within the thoracic spine. No evidence for significant disc pathology, stenosis, or evidence for neural impingement. No metastatic disease. MR LUMBAR SPINE IMPRESSION: 1. Abnormal T2/STIR hyperintense lesion involving the central and left aspect of the sacrum, concerning for osseous metastatic disease. Probable early invasion of the adjacent left S1 and S2 neural foramina, with extraosseous extension into the adjacent presacral space. No visible pathologic fracture. Finding could be further assessed with dedicated MRI of the sacrum/pelvis as clinically warranted. 2. No other metastatic disease within the lumbar spine. 3. 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 4. Left eccentric disc bulge and facet hypertrophy at L3-4 with resultant mild to moderate canal and left lateral recess stenosis. 5. Central to left subarticular disc protrusion at L1-2 with resultant mild to moderate spinal stenosis. Electronically Signed   By: Jeannine Boga M.D.   On: 03/25/2020 01:13   DG Knee Complete 4 Views Left  Result Date: 03/24/2020 CLINICAL DATA:  Pain EXAM: LEFT KNEE - COMPLETE 4+ VIEW COMPARISON:  None. FINDINGS: There are moderate  tricompartmental degenerative changes, greatest within the medial compartment. There is no significant joint effusion. No acute displaced fracture or dislocation. IMPRESSION: Moderate tricompartmental degenerative changes, greatest within the medial compartment. Electronically Signed   By: Constance Holster M.D.   On: 03/24/2020 23:35   DG Hip Unilat W or Wo Pelvis 2-3 Views Left  Result Date: 03/24/2020 CLINICAL DATA:  Pain status post fall EXAM: DG HIP (WITH OR WITHOUT PELVIS) 2-3V LEFT COMPARISON:  None. FINDINGS: There is mild bilateral hip osteoarthritis. There is no acute displaced fracture or dislocation. There are calcifications projecting over the patient's pelvis that are favored to represent phleboliths. IMPRESSION: Mild bilateral hip osteoarthritis. No acute displaced fracture or dislocation. Electronically Signed   By: Constance Holster M.D.   On: 03/24/2020 23:32    EKG: Personally reviewed.  Rhythm is normal sinus rhythm  with heart rate of 75 bpm.  No dynamic ST segment changes appreciated.  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  This is likely the cause of the patient's progressively worsening left buttock, hip and thigh pain over the past several months with associated weakness  No clinical evidence of cauda equina syndrome at this time  Source of the suspected metastatic lesion is unclear.  Considering patient's recent diagnosis of adenocarcinoma of the colon metastatic colon cancer is possible although colon cancer is not a malignancy that typically metastasizes to bone.  Obtaining CT imaging of the chest abdomen and pelvis to evaluate for other evidence of malignancy  If CT imaging does not  reveal a primary source, we will formally consult neurosurgery tomorrow morning to get their opinion on how to best biopsy this lesion whether via surgery or IR guided biopsy.  Once a diagnosis as to the type of cancer has been made will determine how to best manage the lesion (surgery versus radiation) and get oncology involved.  Patient has already received several rounds of steroids in the past several months and therefore I will not place the patient on yet another course at this time.  As needed opiate-based analgesics for now for substantial associated pain  PT evaluation  Active Problems:   Adenocarcinoma of sigmoid colon [facility])   Diagnosed via colonoscopy performed for anemia in January 2021  Status sigmoidectomy in April 2021  Pathology revealed clean margins and no evidence of malignancy was found in any of the lymph nodes assessed  Patient states that she was supposed to follow-up with GI after this procedure but unfortunately never did    Acute cystitis without hematuria   Urinalysis suggestive of urinary tract infection  No fever or typical symptoms of dysuria  However, considering patient's significant weakness, will treat with a short course of antibiotics in case UTI is contributing     Essential hypertension   We will resume home regimen of antihypertensive therapy once this is verified by pharmacy  And intravenous antihypertensives for markedly elevated blood pressures.    Type 2 diabetes mellitus with hyperglycemia, with long-term current use of insulin (HCC)  Accu-Cheks before every meal and nightly with sliding scale insulin  Hemoglobin A1c pending  We will place patient on low-dose basal insulin therapy, will adjust once medication regimen is verified by pharmacy    GERD without esophagitis   Daily PPI per home regimen    Nicotine dependence, cigarettes, uncomplicated   Counseling patient on cessation daily    History of DVT (deep vein  thrombosis)  Per review of outpatient notes, patient has a history of DVT in 2019 and has been instructed by her outpatient providers in the past to remain on lifelong anticoagulation  Patient states that she is intermittently compliant with her Eliquis  Will resume Eliquis at this time  Candidal intertrigo   Patient exhibits notable inflamed hyperemic skin in the folds of the groin, abdomen and breasts with some evidence of skin breakdown of the buttocks and gluteal cleft concerning for ongoing candidal intertrigo  Patient reports having used nystatin powder for a long period of time with no improvement.  Will place patient on course of oral fluconazole   Code Status:  Full  code Family Communication: deferred   Status is: Observation  The patient remains OBS appropriate and will d/c before 2 midnights.  Dispo: The patient is from: Home              Anticipated d/c is to: Home              Anticipated d/c date is: 2 days              Patient currently is not medically stable to d/c.        Vernelle Emerald MD [facility] Pager [phone]  If 7PM-7AM, please contact night-coverage [email or web address] Use universal [place] password for that web site. If you do not have the password, please call the hospital operator.  03/25/2020, 3:48 AM

## 2020-03-25 NOTE — Evaluation (Signed)
Physical Therapy Evaluation Patient Details Name: Tonya Dougherty MRN: [id number] DOB: 28-Apr-1951 Today's Date: 03/25/2020   History of Present Illness  Patient is a 69 y/o female who presented to the ED with progressive weakness as well as left buttock and thigh pain. 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  Clinical Impression  PTA, patient reports modI with mobility using RW and states she was independent with ADLs. Patient at times during evaluation would close eyes and unaware of questions being asked as well as decreased awareness of need for safety. Patient required minA for bed mobility and min guard for sit to stand and stand pivot transfers with RW. Patient presents with decreased activity tolerance, impaired balance, generalized weakness, decreased safety awareness. Patient will benefit from skilled PT services during acute stay to address listed deficits. Recommend SNF following discharge to maximize functional mobility and due to decreased caregiver support at home.    Follow Up Recommendations SNF;Supervision/Assistance - 24 hour    Equipment Recommendations  3in1 (PT)    Recommendations for Other Services       Precautions / Restrictions Precautions Precautions: Fall Restrictions Weight Bearing Restrictions: No      Mobility  Bed Mobility Overal bed mobility: Needs Assistance Bed Mobility: Supine to Sit;Sit to Supine     Supine to sit: Min assist;HOB elevated Sit to supine: Min assist   General bed mobility comments: minA for supine>sit for trunk elevation, minA for sit>supine for B LE advancement onto bed    Transfers Overall transfer level: Needs assistance Equipment used: Rolling Deondrick Searls (2 wheeled) Transfers: Sit to/from [facility] Sit to Stand: Min guard;From elevated surface Stand pivot transfers: Min guard        General transfer comment: cues for hand placement for sit to stand transfer.  min guard for safety during stand pivot transfer bed<>BSC  Ambulation/Gait                Stairs            Wheelchair Mobility    Modified Rankin (Stroke Patients Only)       Balance Overall balance assessment: Needs assistance Sitting-balance support: Single extremity supported;Feet supported Sitting balance-Leahy Scale: Fair     Standing balance support: Bilateral upper extremity supported;During functional activity Standing balance-Leahy Scale: Poor Standing balance comment: reliant on UE support                             Pertinent Vitals/Pain Pain Assessment: No/denies pain    Home Living Family/patient expects to be discharged to:: Private residence Living Arrangements: Other relatives;Other (Comment) (roommate and grandson) Available Help at Discharge: Family;Available PRN/intermittently Type of Home: House Home Access: Stairs to enter Entrance Stairs-Rails: Can reach both Entrance Stairs-Number of Steps: 2 Home Layout: Two level;Able to live on main level with bedroom/bathroom Home Equipment: Gilford Rile - 2 wheels;Sandar Krinke - 4 wheels      Prior Function Level of Independence: Independent with assistive device(s)         Comments: uses rollator for ambulation     Hand Dominance        Extremity/Trunk Assessment   Upper Extremity Assessment Upper Extremity Assessment: Generalized weakness    Lower Extremity Assessment Lower Extremity Assessment: Generalized weakness       Communication   Communication: No difficulties  Cognition Arousal/Alertness: Lethargic;Suspect due to medications Behavior During  Therapy: Flat affect Overall Cognitive Status: No family/caregiver present to determine baseline cognitive functioning Area of Impairment: Safety/judgement;Following commands                       Following Commands: Follows one step  commands with increased time;Follows multi-step commands inconsistently Safety/Judgement: Decreased awareness of safety     General Comments: patient at times closes eyes and unaware of questions being asked, easily awoken       General Comments      Exercises     Assessment/Plan    PT Assessment Patient needs continued PT services  PT Problem List Decreased strength;Decreased activity tolerance;Decreased balance;Decreased mobility;Decreased safety awareness       PT Treatment Interventions DME instruction;Gait training;Stair training;Functional mobility training;Therapeutic activities;Therapeutic exercise;Balance training;Patient/family education    PT Goals (Current goals can be found in the Care Plan section)  Acute Rehab PT Goals Patient Stated Goal: to go home PT Goal Formulation: With patient Time For Goal Achievement: 04/08/20 Potential to Achieve Goals: Fair    Frequency Min 2X/week   Barriers to discharge Decreased caregiver support      Co-evaluation               AM-PAC PT "6 Clicks" Mobility  Outcome Measure Help needed turning from your back to your side while in a flat bed without using bedrails?: A Little Help needed moving from lying on your back to sitting on the side of a flat bed without using bedrails?: A Little Help needed moving to and from a bed to a chair (including a wheelchair)?: A Little Help needed standing up from a chair using your arms (e.g., wheelchair or bedside chair)?: A Little Help needed to walk in hospital room?: A Little Help needed climbing 3-5 steps with a railing? : A Lot 6 Click Score: 17    End of Session Equipment Utilized During Treatment: Gait belt Activity Tolerance: Patient tolerated treatment well Patient left: in bed;with call bell/phone within reach Nurse Communication: Mobility status PT Visit Diagnosis: Unsteadiness on feet (R26.81);Muscle weakness (generalized) (M62.81);Other abnormalities of gait and  mobility (R26.89)    Time: 5183-3582 PT Time Calculation (min) (ACUTE ONLY): 33 min   Charges:   PT Evaluation $PT Eval Low Complexity: 1 Low PT Treatments $Therapeutic Activity: 8-22 mins        Perrin Maltese, PT, DPT Acute Rehabilitation Services Pager [phone] Office [phone]   Tonya Dougherty 03/25/2020, 9:59 AM

## 2020-03-25 NOTE — Progress Notes (Signed)
Called in regards to this patients mri. It was reported that she has had lower back and leg pain since atleast august with subjective leg weakness. Per MD, she has no focal weakness on exam, her leg strength is actually quite good. Mri shows severe canal stenosis at L4-5. She also has likely metastatic lesions in her sacrum. This will need further workup. No acute intervention needed right now for stenosis. We can treat her outpatient for this.

## 2020-03-25 NOTE — ED Provider Notes (Signed)
I assumed care of this patient.  Please see previous provider note for further details of Hx, PE.  Briefly patient is a 69 y.o. female who presented lower extremity weakness with back pain pending an MRI to rule out cauda equina.  UA at this time is also pending.  UA with evidence of infection.  Given Rocephin.  MRI without evidence of cauda equina.  Did reveal disc herniation.  Also notable for likely metastatic disease.   Patient admitted to medicine for further work-up and management.  Fatima Blank, MD 03/25/20 [phone]

## 2020-03-25 NOTE — Progress Notes (Signed)
IR eval requested for biopsy of new sacral mass. Hx of colon cancer. On chronic Eliquis for hx of DVT  Imaging reviewed. Amenable to image guided sacral/pelvic mass. Will need to hold Eliquis x 48 hours. Tentatively plan bx for Monday 11/29.  Ascencion Dike PA-C Interventional Radiology 03/25/2020 4:48 PM

## 2020-03-25 NOTE — Consult Note (Signed)
WOC Nurse Consult Note: Patient receiving care in [facility] 5N30.  Patient able to turn self side to side, however, she is having a lot of pain to the sacral area. I am assuming it is related to her diagnosis. Reason for Consult: skin breakdown to sacrum and buttocks Wound type: MASD-IAD, and I also discovered MASD-ITD to abdominal pannus during my assessment. Pressure Injury POA: Yes/No/NA Measurement: Wound bed: macerated buttock area; fungal rash and erythema to abdominal pannus Drainage (amount, consistency, odor) none Periwound: intact Dressing procedure/placement/frequency: Patient has Moisture Associated Skin Damage - Incontinence Associated Dermatitis to the buttocks and gluteal fold. Cut a narrow strip of Aquacel Kellie Simmering # [id number]) and place into gluteal fold. Then cover with a foam dressing. Check the dressings often to determine if they have been saturated by urine, and if they have, replace. Avoid the use of incontinence brief, instead consider the use of a PureWick. Also, InterDry for the abdominal pannus. Measure and cut length of InterDry to fit in skin folds that have skin breakdown Tuck InterDry fabric into skin folds in a single layer, allow for 2 inches of overhang from skin edges to allow for wicking to occur May remove to bathe; dry area thoroughly and then tuck into affected areas again Do not apply any creams or ointments when using InterDry DO NOT THROW AWAY FOR 5 DAYS unless soiled with stool DO NOT [facility] product, this will inactivate the silver in the material  New sheet of Interdry should be applied after 5 days of use if patient continues to have skin breakdown   And, I have ordered a bed with low air loss mattress. Thank you for the consult.  Discussed plan of care with the patient.  [place] nurse will not follow at this time.  Please re-consult the [place] team if needed.  Val Riles, RN, MSN, CWOCN, CNS-BC, pager [phone]

## 2020-03-25 NOTE — Progress Notes (Signed)
ANTICOAGULATION CONSULT NOTE - Initial Consult  Pharmacy Consult for Heparin (Eliquis on hold) Indication: hx VTE, on chronic anticoagulation  Allergies  Allergen Reactions  . Xarelto [Rivaroxaban] Rash    Severe rash with itching  . Erythromycin Other (See Comments)    Oral Thrush  . Lipitor [Atorvastatin] Other (See Comments)    Leg cramps  . Pravachol [Pravastatin] Other (See Comments)    Leg muscle cramps  . Ivp Dye [Iodinated Diagnostic Agents] Rash    Patient Measurements: Height: 5\' 2"  (157.5 cm) Weight: 111.9 kg (246 lb 11.1 oz) IBW/kg (Calculated) : 50.1 Heparin Dosing Weight: 79 kg  Vital Signs: Temp: 98.2 F (36.8 C) (11/26 1410) Temp Source: Oral (11/26 1410) BP: 151/67 (11/26 1410) Pulse Rate: 69 (11/26 1410)  Labs: Recent Labs    03/24/20 2200 03/25/20 0508  HGB 13.0 13.4  HCT 42.4 44.5  PLT 183 170  CREATININE 1.01* 0.96    Estimated Creatinine Clearance: 65.3 mL/min (by C-G formula based on SCr of 0.96 mg/dL).   Medical History: Past Medical History:  Diagnosis Date  . Allergic rhinitis   . Anxiety   . Arthritis   . Bell's palsy   . Cataract   . Cirrhosis ([place])   . Coarse tremors   . Colon cancer ([place])   . Diabetes mellitus without complication ([place])   . Diverticulosis   . DVT (deep venous thrombosis) ([place])   . GERD (gastroesophageal reflux disease)   . GI bleed   . Hepatitis B   . History of blood transfusion 05/2019  . History of panic attacks   . Hypertension   . Insomnia   . Iron deficiency anemia   . MDD (major depressive disorder)   . Morbid obesity ([place])   . Neuropathy   . Portal vein thrombosis   . Psoriasis   . Renal insufficiency    Assessment:  69 yr old female on Eliquis 5 mg BID prior to admission for hx DVT (2019).  To transition to IV heparin. Hx adenocarcinoma of sigmoid colon s/p resection in 07/2019.  MRI revealed sacral lesion, planning biopsy when off Eliquis for 48 hrs, tentatively 11/29.  Last Eliquis dose  today at ~1:30pm.        Will begin IV heparin ~12 hrs after last Eliquis dose.  Will use aPTTs for monitoring, as heparin levels are expected to be falsely elevated due to recent Eliquis doses.  Goal of Therapy:  Heparin level 0.3-0.7 units/ml aPTT 66-102 seconds Monitor platelets by anticoagulation protocol: Yes   Plan:   Heparin drip to begin ~1:30am on 11/27, without bolus at 1100 units/hr.  Heparin level and aPTT ~8 hrs after drip begins.  Daily heparin level and aPTT until correlating; daily CBC.  Will follow up for timing of biopsy and holding heparin prior to procedure.  Eliquis on hold.   Arty Baumgartner, [place] Phone: [phone] 03/25/2020,7:09 PM

## 2020-03-26 DIAGNOSIS — M533 Sacrococcygeal disorders, not elsewhere classified: Secondary | ICD-10-CM | POA: Diagnosis present

## 2020-03-26 DIAGNOSIS — Z9049 Acquired absence of other specified parts of digestive tract: Secondary | ICD-10-CM | POA: Diagnosis not present

## 2020-03-26 DIAGNOSIS — D509 Iron deficiency anemia, unspecified: Secondary | ICD-10-CM | POA: Diagnosis present

## 2020-03-26 DIAGNOSIS — R531 Weakness: Secondary | ICD-10-CM | POA: Diagnosis present

## 2020-03-26 DIAGNOSIS — C7951 Secondary malignant neoplasm of bone: Secondary | ICD-10-CM | POA: Diagnosis present

## 2020-03-26 DIAGNOSIS — R262 Difficulty in walking, not elsewhere classified: Secondary | ICD-10-CM | POA: Diagnosis not present

## 2020-03-26 DIAGNOSIS — E782 Mixed hyperlipidemia: Secondary | ICD-10-CM | POA: Diagnosis present

## 2020-03-26 DIAGNOSIS — K219 Gastro-esophageal reflux disease without esophagitis: Secondary | ICD-10-CM | POA: Diagnosis present

## 2020-03-26 DIAGNOSIS — K5903 Drug induced constipation: Secondary | ICD-10-CM | POA: Diagnosis present

## 2020-03-26 DIAGNOSIS — F17210 Nicotine dependence, cigarettes, uncomplicated: Secondary | ICD-10-CM | POA: Diagnosis present

## 2020-03-26 DIAGNOSIS — Z86718 Personal history of other venous thrombosis and embolism: Secondary | ICD-10-CM | POA: Diagnosis not present

## 2020-03-26 DIAGNOSIS — I10 Essential (primary) hypertension: Secondary | ICD-10-CM | POA: Diagnosis not present

## 2020-03-26 DIAGNOSIS — W1839XA Other fall on same level, initial encounter: Secondary | ICD-10-CM | POA: Diagnosis not present

## 2020-03-26 DIAGNOSIS — Z515 Encounter for palliative care: Secondary | ICD-10-CM | POA: Diagnosis not present

## 2020-03-26 DIAGNOSIS — N390 Urinary tract infection, site not specified: Secondary | ICD-10-CM | POA: Diagnosis not present

## 2020-03-26 DIAGNOSIS — E1169 Type 2 diabetes mellitus with other specified complication: Secondary | ICD-10-CM | POA: Diagnosis present

## 2020-03-26 DIAGNOSIS — E1165 Type 2 diabetes mellitus with hyperglycemia: Secondary | ICD-10-CM | POA: Diagnosis present

## 2020-03-26 DIAGNOSIS — D696 Thrombocytopenia, unspecified: Secondary | ICD-10-CM | POA: Diagnosis present

## 2020-03-26 DIAGNOSIS — Z20822 Contact with and (suspected) exposure to covid-19: Secondary | ICD-10-CM | POA: Diagnosis present

## 2020-03-26 DIAGNOSIS — Y92230 Patient room in hospital as the place of occurrence of the external cause: Secondary | ICD-10-CM | POA: Diagnosis not present

## 2020-03-26 DIAGNOSIS — M79604 Pain in right leg: Secondary | ICD-10-CM | POA: Diagnosis not present

## 2020-03-26 DIAGNOSIS — L409 Psoriasis, unspecified: Secondary | ICD-10-CM | POA: Diagnosis present

## 2020-03-26 DIAGNOSIS — M48061 Spinal stenosis, lumbar region without neurogenic claudication: Secondary | ICD-10-CM | POA: Diagnosis present

## 2020-03-26 DIAGNOSIS — R159 Full incontinence of feces: Secondary | ICD-10-CM | POA: Diagnosis present

## 2020-03-26 DIAGNOSIS — K5900 Constipation, unspecified: Secondary | ICD-10-CM | POA: Diagnosis not present

## 2020-03-26 DIAGNOSIS — K760 Fatty (change of) liver, not elsewhere classified: Secondary | ICD-10-CM | POA: Diagnosis present

## 2020-03-26 DIAGNOSIS — N189 Chronic kidney disease, unspecified: Secondary | ICD-10-CM | POA: Diagnosis present

## 2020-03-26 DIAGNOSIS — M79605 Pain in left leg: Secondary | ICD-10-CM

## 2020-03-26 DIAGNOSIS — B9620 Unspecified Escherichia coli [E. coli] as the cause of diseases classified elsewhere: Secondary | ICD-10-CM | POA: Diagnosis present

## 2020-03-26 DIAGNOSIS — I129 Hypertensive chronic kidney disease with stage 1 through stage 4 chronic kidney disease, or unspecified chronic kidney disease: Secondary | ICD-10-CM | POA: Diagnosis present

## 2020-03-26 DIAGNOSIS — L89152 Pressure ulcer of sacral region, stage 2: Secondary | ICD-10-CM | POA: Diagnosis present

## 2020-03-26 DIAGNOSIS — G5700 Lesion of sciatic nerve, unspecified lower limb: Secondary | ICD-10-CM | POA: Diagnosis present

## 2020-03-26 DIAGNOSIS — Z7189 Other specified counseling: Secondary | ICD-10-CM | POA: Diagnosis not present

## 2020-03-26 DIAGNOSIS — Z6841 Body Mass Index (BMI) 40.0 and over, adult: Secondary | ICD-10-CM | POA: Diagnosis not present

## 2020-03-26 DIAGNOSIS — C187 Malignant neoplasm of sigmoid colon: Secondary | ICD-10-CM | POA: Diagnosis present

## 2020-03-26 DIAGNOSIS — M545 Low back pain, unspecified: Secondary | ICD-10-CM

## 2020-03-26 DIAGNOSIS — N3000 Acute cystitis without hematuria: Secondary | ICD-10-CM | POA: Diagnosis present

## 2020-03-26 LAB — CBC
HCT: 45.8 % (ref 36.0–46.0)
Hemoglobin: 13.8 g/dL (ref 12.0–15.0)
MCH: 28.8 pg (ref 26.0–34.0)
MCHC: 30.1 g/dL (ref 30.0–36.0)
MCV: 95.4 fL (ref 80.0–100.0)
Platelets: 215 10*3/uL (ref 150–400)
RBC: 4.8 MIL/uL (ref 3.87–5.11)
RDW: 20.1 % — ABNORMAL HIGH (ref 11.5–15.5)
WBC: 12.3 10*3/uL — ABNORMAL HIGH (ref 4.0–10.5)
nRBC: 0 % (ref 0.0–0.2)

## 2020-03-26 LAB — GLUCOSE, CAPILLARY
Glucose-Capillary: 86 mg/dL (ref 70–99)
Glucose-Capillary: 140 mg/dL — ABNORMAL HIGH (ref 70–99)
Glucose-Capillary: 181 mg/dL — ABNORMAL HIGH (ref 70–99)
Glucose-Capillary: 150 mg/dL — ABNORMAL HIGH (ref 70–99)

## 2020-03-26 LAB — HEPARIN LEVEL (UNFRACTIONATED)
Heparin Unfractionated: 0.93 IU/mL — ABNORMAL HIGH (ref 0.30–0.70)
Heparin Unfractionated: 0.66 IU/mL (ref 0.30–0.70)

## 2020-03-26 LAB — APTT
aPTT: 46 seconds — ABNORMAL HIGH (ref 24–36)
aPTT: 52 seconds — ABNORMAL HIGH (ref 24–36)

## 2020-03-26 MED ORDER — DIPHENHYDRAMINE HCL 25 MG PO CAPS
50.0000 mg | ORAL_CAPSULE | Freq: Four times a day (QID) | ORAL | Status: DC | PRN
  Administered 2020-03-26 – 2020-03-27 (×5): 50 mg via ORAL
  Filled 2020-03-26 (×5): qty 2

## 2020-03-26 MED ORDER — HEPARIN (PORCINE) 25000 UT/250ML-% IV SOLN
1600.0000 [IU]/h | INTRAVENOUS | Status: DC
  Administered 2020-03-26: 1400 [IU]/h via INTRAVENOUS
  Filled 2020-03-26: qty 250

## 2020-03-26 NOTE — Progress Notes (Signed)
Occupational Therapy Evaluation Patient Details Name: Tonya Dougherty MRN: [id number] DOB: 07-21-1950 Today's Date: 03/26/2020    History of Present Illness Patient is a 69 y/o female who presented to the ED with progressive weakness as well as left buttock and thigh pain. 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   Clinical Impression   Prior to hospitalization, pt reports living with roommate and 64 y.o. grandson in a 2-story house with 2 STE and ability to live on the 1st floor. Pt reports performing all ADLs/IADLs/functional mobility with Mod I using rollator for support. Pt reports driving and managing her own medications. Today, pt received semi-reclined in bed, pt agreeable to OT eval. Redness noted to pt's entire body and patient exhibiting increased itching. Pt's reports having allergic reaction to dye earlier today, nurse aware and administering benadryl as needed. Pt presents with intact strength/ROM to all four extremities. Pt exhibits tremors/shaking to UB- ongoing and care team aware. Currently, pt requires supervision for bed mobility (min guard for safety 2/2 kinair bed), setup-mod I for UB self-care, mod-SBA for LB self-care, and min guard for sit/stand from bed and stand pivot transfer to chair using RW. Educated pt on adaptive ADL strategies, safety awareness, activity pacing. Recommending SNF, however may progress to home with 24/7 S/A and HHOT. Will continue to follow pt acutely as able. Pt would benefit from continued skilled acute care OT services to maximize independence with ADLs/ADL mobility.       Follow Up Recommendations  SNF;Supervision/Assistance - 24 hour;Home health OT (prefer SNF- may progress to home with 24/7 S/A and HHOT)    Equipment Recommendations  3 in 1 bedside commode    Recommendations for Other Services Other (comment) (None)      Precautions / Restrictions Precautions Precautions: Fall Restrictions Weight Bearing Restrictions: No      Mobility Bed Mobility Overal bed mobility: Needs Assistance Bed Mobility: Rolling;Sidelying to Sit Rolling: Modified independent (Device/Increase time) Sidelying to sit: Min guard;Supervision;HOB elevated       General bed mobility comments: min guard for transitioning to EOB today using bed railing     Transfers Overall transfer level: Needs assistance Equipment used: Rolling walker (2 wheeled) Transfers: Sit to/from [facility] Sit to Stand: Min guard Stand pivot transfers: Min guard       General transfer comment: min guard for sit>stand from bed and stand pivot transfer from bed>chair using RW    Balance Overall balance assessment: Needs assistance Sitting-balance support: Feet supported Sitting balance-Leahy Scale: Good     Standing balance support: Bilateral upper extremity supported;During functional activity Standing balance-Leahy Scale: Poor Standing balance comment: reliant on UE support      ADL either performed or assessed with clinical judgement   ADL Overall ADL's : Needs assistance/impaired Eating/Feeding: Modified independent Eating/Feeding Details (indicate cue type and reason): sat in chair to eat dinner Grooming: Set up;Sitting   Upper Body Bathing: Set up;Sitting   Lower Body Bathing: Moderate assistance;Sitting/lateral leans;Sit to/from stand   Upper Body Dressing : Set up;Sitting   Lower Body Dressing: Moderate assistance;Sitting/lateral leans;Sit to/from stand   Toilet Transfer: Min Insurance claims handler Details (indicate cue type and reason): min guard for sit>stand from bed level and stand pivot transfer from bed>chair using RW (simulated toilet transfer) Toileting- Clothing Manipulation and Hygiene: Moderate assistance;Sitting/lateral lean;Sit to/from stand   Tub/ Banker:  (not  assessed) Tub/Shower Transfer Details (indicate  cue type and reason): not assessed Functional mobility during ADLs: Min guard;Rolling walker General ADL Comments: exhibits increased tremors with functional movement (ongoing)     Vision Baseline Vision/History: Wears glasses Wears Glasses: Reading only Patient Visual Report: No change from baseline Vision Assessment?: No apparent visual deficits            Pertinent Vitals/Pain Pain Assessment: 0-10 Pain Score: 3  Pain Location: L hip Pain Descriptors / Indicators: Burning;Aching Pain Intervention(s): Monitored during session;Repositioned;Relaxation     Hand Dominance Right   Extremity/Trunk Assessment Upper Extremity Assessment Upper Extremity Assessment: Overall WFL for tasks assessed   Lower Extremity Assessment Lower Extremity Assessment: Overall WFL for tasks assessed   Cervical / Trunk Assessment Cervical / Trunk Assessment: Normal   Communication Communication Communication: No difficulties   Cognition Arousal/Alertness: Awake/alert Behavior During Therapy: WFL for tasks assessed/performed Overall Cognitive Status: Within Functional Limits for tasks assessed     Following Commands: Follows multi-step commands consistently       General Comments: pt oriented Ox4 today   General Comments  using purewick (difficulty knowing when she needs to urinate), redness noted to entire body (pt allergic to dye earlier today- nursing aware and administering benedryl); pt reports wounds to buttocks      Home Living Family/patient expects to be discharged to:: Private residence Living Arrangements: Other relatives;Other (Comment) (roomate and grandson) Available Help at Discharge: Family;Available PRN/intermittently Type of Home: House Home Access: Stairs to enter [employer] of Steps: 2 Entrance Stairs-Rails: None Home Layout: Two level;Able to live on main level with bedroom/bathroom     Bathroom  Shower/Tub: Occupational psychologist: Standard Bathroom Accessibility: Yes How Accessible: Accessible via walker Home Equipment: [place] - 4 wheels;Walker - 2 wheels;Wheelchair - manual          Prior Functioning/Environment Level of Independence: Independent with assistive device(s)        Comments: uses rollator for ambulation, Mod I for self-care, Mod I for most IADLs        OT Problem List: Decreased strength;Decreased activity tolerance;Impaired balance (sitting and/or standing);Pain      OT Treatment/Interventions: Self-care/ADL training;Therapeutic exercise;Neuromuscular education;Energy conservation;DME and/or AE instruction;Therapeutic activities;Patient/family education    OT Goals(Current goals can be found in the care plan section) Acute Rehab OT Goals Patient Stated Goal: to go home OT Goal Formulation: With patient Time For Goal Achievement: 04/09/20 Potential to Achieve Goals: Good  OT Frequency: Min 2X/week    AM-PAC OT "6 Clicks" Daily Activity     Outcome Measure Help from another person eating meals?: None Help from another person taking care of personal grooming?: A Little Help from another person toileting, which includes using toliet, bedpan, or urinal?: Total Help from another person bathing (including washing, rinsing, drying)?: A Lot Help from another person to put on and taking off regular upper body clothing?: A Little Help from another person to put on and taking off regular lower body clothing?: A Lot 6 Click Score: 15   End of Session Equipment Utilized During Treatment: Gait belt;Rolling walker Nurse Communication: Mobility status  Activity Tolerance: Patient tolerated treatment well;Other (comment) (limited 2/2 entire body itching (allergic reaction earlier)) Patient left: in chair;with call bell/phone within reach  OT Visit Diagnosis: Unsteadiness on feet (R26.81);Repeated falls (R29.6);Pain;Muscle weakness (generalized)  (M62.81) Pain - Right/Left: Left Pain - part of body: Hip                Time: 7342-8768 OT Time Calculation (min):  28 min Charges:  OT General Charges $OT Visit: 1 Visit OT Evaluation $OT Eval Moderate Complexity: 1 Mod OT Treatments $Self Care/Home Management : 8-22 mins  Michel Bickers, OTR/L [facility] [phone]  Francesca Jewett 03/26/2020, 3:43 PM

## 2020-03-26 NOTE — Progress Notes (Signed)
ANTICOAGULATION CONSULT NOTE - Initial Consult  Pharmacy Consult for Heparin (Eliquis on hold) Indication: hx VTE, on chronic anticoagulation  Allergies  Allergen Reactions  . Xarelto [Rivaroxaban] Rash    Severe rash with itching  . Erythromycin Other (See Comments)    Oral Thrush  . Lipitor [Atorvastatin] Other (See Comments)    Leg cramps  . Pravachol [Pravastatin] Other (See Comments)    Leg muscle cramps  . Ivp Dye [Iodinated Diagnostic Agents] Rash    Patient Measurements: Height: 5\' 2"  (157.5 cm) Weight: 111.9 kg (246 lb 11.1 oz) IBW/kg (Calculated) : 50.1 Heparin Dosing Weight: 77.4 kg  Vital Signs: Temp: 97.8 F (36.6 C) (11/27 0742) Temp Source: Oral (11/27 0742) BP: 108/61 (11/27 0742) Pulse Rate: 75 (11/27 0742)  Labs: Recent Labs    03/24/20 2200 03/24/20 2200 03/25/20 0508 03/26/20 0925  HGB 13.0   < > 13.4 13.8  HCT 42.4  --  44.5 45.8  PLT 183  --  170 215  APTT  --   --   --  46*  HEPARINUNFRC  --   --   --  0.93*  CREATININE 1.01*  --  0.96  --    < > = values in this interval not displayed.    Estimated Creatinine Clearance: 65.3 mL/min (by C-G formula based on SCr of 0.96 mg/dL).   Medical History: Past Medical History:  Diagnosis Date  . Allergic rhinitis   . Anxiety   . Arthritis   . Bell's palsy   . Cataract   . Cirrhosis ([place])   . Coarse tremors   . Colon cancer ([place])   . Diabetes mellitus without complication ([place])   . Diverticulosis   . DVT (deep venous thrombosis) ([place])   . GERD (gastroesophageal reflux disease)   . GI bleed   . Hepatitis B   . History of blood transfusion 05/2019  . History of panic attacks   . Hypertension   . Insomnia   . Iron deficiency anemia   . MDD (major depressive disorder)   . Morbid obesity ([place])   . Neuropathy   . Portal vein thrombosis   . Psoriasis   . Renal insufficiency    Assessment:  69 yr old female on Eliquis 5 mg BID prior to admission for hx DVT (2019).  To transition to  IV heparin. Hx adenocarcinoma of sigmoid colon s/p resection in 07/2019.  MRI revealed sacral lesion, planning biopsy when off Eliquis for 48 hrs, tentatively 11/29.  Last Eliquis dose yesterday at ~1:30pm.     Initiated IV heparin ~12 hrs after last Eliquis dose.  Will use aPTTs for monitoring, as heparin levels are expected to be falsely elevated due to recent Eliquis doses.This morning, 8-hour APTT subtherapeutic at 46 seconds. Will increase heparin infusion by 4 units/kg/hr to 1400 units/hr and check aPTT/HL in 6-8 hours.    Goal of Therapy:  Heparin level 0.3-0.7 units/ml aPTT 66-102 seconds Monitor platelets by anticoagulation protocol: Yes   Plan:  Increase heparin infusion to 1400 U/hr  Heparin level and aPTT ~8 hrs   Daily heparin level and aPTT until correlating; daily CBC.  Will follow up for timing of biopsy and holding heparin prior to procedure.   Alfonse Spruce, PharmD PGY2 ID Pharmacy Resident Phone between 7 am - 3:30 pm: [phone]  Please check AMION for all [place] phone numbers After 10:00 PM, call [place] [phone]  03/26/2020,10:41 AM

## 2020-03-26 NOTE — Plan of Care (Signed)

## 2020-03-26 NOTE — Progress Notes (Signed)
ANTICOAGULATION CONSULT NOTE - Follow Up Consult  Pharmacy Consult for Heparin (Eliquis on hold) Indication: hx VTE, on chronic anticoagulation  Allergies  Allergen Reactions  . Xarelto [Rivaroxaban] Rash    Severe rash with itching  . Erythromycin Other (See Comments)    Oral Thrush  . Lipitor [Atorvastatin] Other (See Comments)    Leg cramps  . Pravachol [Pravastatin] Other (See Comments)    Leg muscle cramps  . Ivp Dye [Iodinated Diagnostic Agents] Rash    Patient Measurements: Height: 5\' 2"  (157.5 cm) Weight: 111.9 kg (246 lb 11.1 oz) IBW/kg (Calculated) : 50.1 Heparin Dosing Weight: 77.4 kg  Vital Signs: Temp: 98.1 F (36.7 C) (11/27 1437) Temp Source: Oral (11/27 1437) BP: 119/55 (11/27 1437) Pulse Rate: 80 (11/27 1437)  Labs: Recent Labs    03/24/20 2200 03/24/20 2200 03/25/20 0508 03/26/20 0925 03/26/20 1857  HGB 13.0   < > 13.4 13.8  --   HCT 42.4  --  44.5 45.8  --   PLT 183  --  170 215  --   APTT  --   --   --  46* 52*  HEPARINUNFRC  --   --   --  0.93* 0.66  CREATININE 1.01*  --  0.96  --   --    < > = values in this interval not displayed.    Estimated Creatinine Clearance: 65.3 mL/min (by C-G formula based on SCr of 0.96 mg/dL).   Medical History: Past Medical History:  Diagnosis Date  . Allergic rhinitis   . Anxiety   . Arthritis   . Bell's palsy   . Cataract   . Cirrhosis ([place])   . Coarse tremors   . Colon cancer ([place])   . Diabetes mellitus without complication ([place])   . Diverticulosis   . DVT (deep venous thrombosis) ([place])   . GERD (gastroesophageal reflux disease)   . GI bleed   . Hepatitis B   . History of blood transfusion 05/2019  . History of panic attacks   . Hypertension   . Insomnia   . Iron deficiency anemia   . MDD (major depressive disorder)   . Morbid obesity ([place])   . Neuropathy   . Portal vein thrombosis   . Psoriasis   . Renal insufficiency    Assessment:  69 yr old female on Eliquis 5 mg BID prior to  admission for hx DVT (2019).  To transition to IV heparin. Hx adenocarcinoma of sigmoid colon s/p resection in 07/2019.  MRI revealed sacral lesion, planning biopsy when off Eliquis for 48 hrs, tentatively 11/29.  Last Eliquis dose yesterday at ~1:30pm.     Initiated IV heparin ~12 hrs after last Eliquis dose.  Will use aPTTs for monitoring, as heparin levels are expected to be falsely elevated due to recent Eliquis doses.Heparin drip 1400 units/hr and aPTT 52sec  - no bleeding noted    Goal of Therapy:  Heparin level 0.3-0.7 units/ml aPTT 66-102 seconds Monitor platelets by anticoagulation protocol: Yes   Plan:  Increase heparin infusion to 1600 U/hr Daily heparin level and aPTT until correlating; daily CBC. Will follow up for timing of biopsy and holding heparin prior to procedure.   Bonnita Nasuti Pharm.D. CPP, BCPS Clinical Pharmacist [phone] 03/26/2020 7:56 PM     Please check AMION for all [place] phone numbers After 10:00 PM, call [place] [phone]  03/26/2020,7:55 PM

## 2020-03-26 NOTE — Progress Notes (Addendum)
PROGRESS NOTE  Tonya Dougherty HUT:[id number] DOB: 03/27/1951   PCP: Gay Filler, MD  Patient is from: Home. 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  She also have associated LLE weakness.  She reports urinary incontinence and bowel incontinence since she had colectomy.  She reports nausea and poor appetite.  She reports about 14 pounds weight loss but intentional.  She denies fever, injury or trauma or excessive night sweats.  She denies cough or shortness of breath.  In ED, vitals within normal.  Afebrile.  CMP and CBC with differential without significant finding.  Limited RVP screen negative.  UA with moderate LE and Hgb, nitrite and many bacteria.  Hip x-ray with mild osteoarthritis.  MRI thoracolumbar spine reveals sacral lesion on the left with probable early invasion of S1 and S2 neural foramina.  MRI also revealed degenerative changes at L4-5 with severe canal and bilateral subarticular stenosis with moderate L4 foraminal narrowing, left eccentric disc bulge and facet hypertrophy at L3-4 with resultant mild to moderate canal and left lateral recess stenosis and central to left subarticular disc protrusion at L1-2 with resultant mild to moderate spinal stenosis.   CT chest/abdomen/pelvis showed destructive left sacral lesion with associated 2.8 x 3.5 cm soft tissue component anterior to the left sacrum worrisome for metastasis, and 2.6 cm hypoenhancing mass in the right upper kidney.   Discussed finding with oncology, Dr. Alen Blew who suggested IR  biopsy of the sacral lesion but not impressed with the renal lesion.  Interventional radiology and neurosurgery consulted.  Neurosurgery recommended outpatient follow-up with canal and foraminal stenosis.  IR to do image guided biopsy of sacral lesion on 11/29.  Subjective: Seen and examined earlier this morning.  No major events overnight or this morning.  Pain well controlled with p.o. pain medications.  Denies chest pain, dyspnea, GI or UTI symptoms other than urinary incontinence especially when she gets she gets up.  She says she no longer had bowel accidents.  Objective: Vitals:   03/25/20 2031 03/26/20 0417 03/26/20 0742 03/26/20 1114  BP: (!) 115/47 (!) 120/56 108/61 110/61  Pulse: 71 70 75 79  Resp: 17 16 17 16   Temp: 98.2 F (36.8 C) 98.2 F (36.8 C) 97.8 F (36.6 C) 98.1 F (36.7 C)  TempSrc: Oral Oral Oral Oral  SpO2: 97% 97% 97% 98%  Weight:      Height:       No intake or output data in the 24 hours ending 03/26/20 1334 Filed Weights   03/24/20 2140  Weight: 111.9 kg    Examination:  GENERAL: No apparent distress.  Nontoxic. HEENT: MMM.  Vision and hearing grossly intact.  NECK: Supple.  No apparent JVD.  RESP: On RA.  No IWOB.  Fair aeration bilaterally. CVS:  RRR. Heart sounds normal.  ABD/GI/GU: BS+. Abd soft, NTND.  MSK/EXT:  Moves extremities. No apparent deformity. No edema.  SKIN: no apparent skin lesion or wound NEURO: Awake, alert and oriented appropriately.  Motor 4+/5 in BLE.  Light sensation intact.  Achilles and patellar reflex symmetric. PSYCH: Calm. Normal  affect.  Procedures:  None  Microbiology summarized: [id number] and influenza PCR nonreactive. Urine culture with 1000 E. coli  Assessment & Plan: Ambulatory dysfunction/LLE pain and weakness-seems radiculopathic.  MRI thoracolumbar spine reveals sacral lesion on the left with probable early invasion of S1 and S2 neural foramina.  MRI also revealed degenerative changes at L4-5 with severe  canal and bilateral subarticular stenosis with moderate L4 foraminal narrowing, left eccentric disc bulge and facet hypertrophy at L3-4 with resultant mild to moderate canal and left lateral recess stenosis and central to left subarticular disc protrusion at L1-2 with resultant mild to moderate spinal stenosis.  Pain resolved. -Neurosurgery PA, Kimberley Meyran recommended outpatient follow-up. -IR consulted for sacral biopsy-likely on 11/29 after Eliquis washout. -Pain control with as needed Tylenol and Percocet -Continue increased dose of gabapentin at 600 mg 3 times daily -PT/OT-recommended SNF.  Sacral lesion concerning for neoplasm: MRI of lumbar spine revealed sacral lesions with invasion of S1 and S2.  No primary source on CT chest/abdomen/pelvis.  Reports urinary and fecal incontinence but this has been going on since she had colectomy. -Discussed with oncology, Dr. Alen Blew who recommended IR biopsy -Plan for image guided biopsy by IR on 11/29.  Adenocarcinoma of sigmoid colon s/p sigmoidectomy in April 2021.  Pathology with clean margins.  -Needs outpatient follow-up with GI  Acute cystitis with hematuria?  UA concerning but no acute UTI symptoms other than chronic urinary incontinence.  Urine culture with E. coli. -Complete short course of ceftriaxone for 3 days -Follow culture sensitivity   Essential hypertension: Normotensive -Continue home medications  Controlled DM-2 with hyperglycemia: A1c 6.4%. Recent Labs  Lab 03/25/20 1143 03/25/20 1602 03/25/20 2020 03/26/20 0640 03/26/20 1124  GLUCAP 160* 197* 193* 86 140*  -Continue current insulin regimen  GERD without esophagitis -Continue PPI  Tobacco use disorder: -Cessation counseling and nicotine patch   History of DVT (deep vein thrombosis): On Eliquis. -Now on IV heparin for IR biopsy  Candidal intertrigo -Continue nystatin powder and Diflucan  Anxiety and depression: Stable. -Continue home  medications  Morbid obesity Body mass index is 45.12 kg/m. Nutrition Problem: Increased nutrient needs Etiology: cancer and cancer related treatments-Encourage lifestyle change to lose weight. Signs/Symptoms: estimated needs-Could benefit from GLP-1 inhibitors given diabetes. Interventions: Ensure Enlive (each supplement provides 350kcal and 20 grams of protein), MVI   Stage II sacral pressure injury: POA Pressure Injury 03/25/20 Sacrum Right;Left Stage 2 -  Partial thickness loss of dermis presenting as a shallow open injury with a red, pink wound bed without slough. (Active)  03/25/20 0430  Location: Sacrum  Location Orientation: Right;Left  Staging: Stage 2 -  Partial thickness loss of dermis presenting as a shallow open injury with a red, pink wound bed without slough.  Wound Description (Comments):   Present on Admission: Yes   DVT prophylaxis:    Code Status: Full code Family Communication: Patient and/or RN. Available if any question.  Status is: Observation   The patient will require care spanning > 2 midnights and should be moved to inpatient because: Ongoing diagnostic testing needed not appropriate for outpatient work up, Unsafe d/c plan, IV treatments appropriate due to intensity of illness or inability to take PO and Inpatient level of care appropriate due to severity of illness  Dispo: The patient is from: Home              Anticipated d/c is to: SNF              Anticipated  d/c date is: 2 days after IR biopsy of sacral lesion              Patient currently is medically stable to d/c.           Consultants:  Neurosurgery over the phone Oncology over the phone Interventional radiology   Sch Meds:  Scheduled Meds: . carvedilol  3.125 mg Oral BID WC  . feeding supplement  237 mL Oral BID BM  . fluconazole  150 mg Oral Daily  . gabapentin  600 mg Oral TID  . insulin aspart  0-15 Units Subcutaneous TID AC & HS  . insulin glargine  40 Units  Subcutaneous Q2200  . multivitamin with minerals  1 tablet Oral Daily  . nystatin   Topical BID  . pantoprazole  40 mg Oral Daily  . Zinc Oxide   Topical BID   Continuous Infusions: . cefTRIAXone (ROCEPHIN)  IV 1 g (03/26/20 0055)  . heparin 1,400 Units/hr (03/26/20 1125)   PRN Meds:.acetaminophen **OR** acetaminophen, diphenhydrAMINE, fluticasone, ondansetron **OR** ondansetron (ZOFRAN) IV, oxyCODONE-acetaminophen, polyethylene glycol, polyvinyl alcohol  Antimicrobials: Anti-infectives (From admission, onward)   Start     Dose/Rate Route Frequency Ordered Stop   03/26/20 0000  cefTRIAXone (ROCEPHIN) 1 g in sodium chloride 0.9 % 100 mL IVPB        1 g 200 mL/hr over 30 Minutes Intravenous Every 24 hours 03/25/20 0333 03/29/20 2359   03/25/20 1000  fluconazole (DIFLUCAN) tablet 150 mg        150 mg Oral Daily 03/25/20 0556     03/25/20 0045  cefTRIAXone (ROCEPHIN) 1 g in sodium chloride 0.9 % 100 mL IVPB        1 g 200 mL/hr over 30 Minutes Intravenous  Once 03/25/20 0037 03/25/20 0158       I have personally reviewed the following labs and images: CBC: Recent Labs  Lab 03/24/20 2200 03/25/20 0508 03/26/20 0925  WBC 8.4 8.3 12.3*  NEUTROABS 5.6 6.1  --   HGB 13.0 13.4 13.8  HCT 42.4 44.5 45.8  MCV 94.6 94.7 95.4  PLT 183 170 215   BMP &GFR Recent Labs  Lab 03/24/20 2200 03/25/20 0508  NA 140 140  K 4.1 3.9  CL 106 104  CO2 22 23  GLUCOSE 171* 127*  BUN 27* 22  CREATININE 1.01* 0.96  CALCIUM 9.2 9.2  MG  --  2.1   Estimated Creatinine Clearance: 65.3 mL/min (by C-G formula based on SCr of 0.96 mg/dL). Liver & Pancreas: Recent Labs  Lab 03/24/20 2200 03/25/20 0508  AST 23 19  ALT 14 14  ALKPHOS 130* 123  BILITOT 0.8 1.3*  PROT 6.9 7.0  ALBUMIN 2.9* 2.8*   No results for input(s): LIPASE, AMYLASE in the last 168 hours. No results for input(s): AMMONIA in the last 168 hours. Diabetic: Recent Labs    03/25/20 0508  HGBA1C 6.4*   Recent Labs  Lab  03/25/20 1143 03/25/20 1602 03/25/20 2020 03/26/20 0640 03/26/20 1124  GLUCAP 160* 197* 193* 86 140*   Cardiac Enzymes: No results for input(s): CKTOTAL, CKMB, CKMBINDEX, TROPONINI in the last 168 hours. No results for input(s): PROBNP in the last 8760 hours. Coagulation Profile: No results for input(s): INR, PROTIME in the last 168 hours. Thyroid Function Tests: No results for input(s): TSH, T4TOTAL, FREET4, T3FREE, THYROIDAB in the last 72 hours. Lipid Profile: No results for input(s): CHOL, HDL, LDLCALC, TRIG, CHOLHDL, LDLDIRECT in the last 72 hours. Anemia Panel:  No results for input(s): VITAMINB12, FOLATE, FERRITIN, TIBC, IRON, RETICCTPCT in the last 72 hours. Urine analysis:    Component Value Date/Time   COLORURINE YELLOW 03/24/2020 2311   APPEARANCEUR CLOUDY (A) 03/24/2020 2311   LABSPEC 1.018 03/24/2020 2311   PHURINE 5.0 03/24/2020 2311   GLUCOSEU NEGATIVE 03/24/2020 2311   HGBUR MODERATE (A) 03/24/2020 2311   BILIRUBINUR NEGATIVE 03/24/2020 2311   [place] 03/24/2020 2311   PROTEINUR 30 (A) 03/24/2020 2311   NITRITE POSITIVE (A) 03/24/2020 2311   LEUKOCYTESUR MODERATE (A) 03/24/2020 2311   Sepsis Labs: Invalid input(s): PROCALCITONIN, [place]  Microbiology: Recent Results (from the past 240 hour(s))  Culture, Urine     Status: Abnormal (Preliminary result)   Collection Time: 03/24/20 11:11 PM   Specimen: Urine, Clean Catch  Result Value Ref Range Status   Specimen Description URINE, CLEAN CATCH  Final   Special Requests NONE  Final   Culture (A)  Final    >=100,000 COLONIES/mL ESCHERICHIA COLI SUSCEPTIBILITIES TO FOLLOW Performed at [facility], [address] [place], [place] [address]    Report Status PENDING  Incomplete  Resp Panel by RT-PCR (Flu A&B, Covid) Nasopharyngeal Swab     Status: None   Collection Time: 03/25/20  2:36 AM   Specimen: Nasopharyngeal Swab; Nasopharyngeal(NP) swabs in vial transport medium  Result Value  Ref Range Status   SARS Coronavirus 2 by RT PCR NEGATIVE NEGATIVE Final    Comment: (NOTE) SARS-CoV-2 target nucleic acids are NOT DETECTED.  The SARS-CoV-2 RNA is generally detectable in upper respiratory specimens during the acute phase of infection. The lowest concentration of SARS-CoV-2 viral copies this assay can detect is 138 copies/mL. A negative result does not preclude SARS-Cov-2 infection and should not be used as the sole basis for treatment or other patient management decisions. A negative result may occur with  improper specimen collection/handling, submission of specimen other than nasopharyngeal swab, presence of viral mutation(s) within the areas targeted by this assay, and inadequate number of viral copies(<138 copies/mL). A negative result must be combined with clinical observations, patient history, and epidemiological information. The expected result is Negative.  Fact Sheet for Patients:  EntrepreneurPulse.com.au  Fact Sheet for Healthcare Providers:  IncredibleEmployment.be  This test is no t yet approved or cleared by the [place] FDA and  has been authorized for detection and/or diagnosis of SARS-CoV-2 by FDA under an Emergency Use Authorization (EUA). This EUA will remain  in effect (meaning this test can be used) for the duration of the COVID-19 declaration under Section 564(b)(1) of the Act, 21 U.S.C.section 360bbb-3(b)(1), unless the authorization is terminated  or revoked sooner.       Influenza A by PCR NEGATIVE NEGATIVE Final   Influenza B by PCR NEGATIVE NEGATIVE Final    Comment: (NOTE) The Xpert Xpress SARS-CoV-2/FLU/RSV plus assay is intended as an aid in the diagnosis of influenza from Nasopharyngeal swab specimens and should not be used as a sole basis for treatment. Nasal washings and aspirates are unacceptable for Xpert Xpress SARS-CoV-2/FLU/RSV testing.  Fact Sheet for  Patients: EntrepreneurPulse.com.au  Fact Sheet for Healthcare Providers: IncredibleEmployment.be  This test is not yet approved or cleared by the [place] FDA and has been authorized for detection and/or diagnosis of SARS-CoV-2 by FDA under an Emergency Use Authorization (EUA). This EUA will remain in effect (meaning this test can be used) for the duration of the COVID-19 declaration under Section 564(b)(1) of the Act, 21 U.S.C. section 360bbb-3(b)(1), unless  the authorization is terminated or revoked.  Performed at [facility], [place] [address] [place], [place] [address]     Radiology Studies: No results found.   Diani Jillson T. [place]  If 7PM-7AM, please contact night-coverage [email or web address] 03/26/2020, 1:34 PM

## 2020-03-26 NOTE — Plan of Care (Signed)
  Problem: Education: Goal: Knowledge of General Education information will improve Description Including pain rating scale, medication(s)/side effects and non-pharmacologic comfort measures Outcome: Progressing   Problem: Health Behavior/Discharge Planning: Goal: Ability to manage health-related needs will improve Outcome: Progressing   

## 2020-03-26 NOTE — Plan of Care (Signed)
Heparin drip started at 0159 since the patient takes eliquis chronically; eliquis currently on hold. Biopsy with IR scheduled for Monday 11/29 and patient is aware. Wound care orders reviewed and purewick is in place for patient for incontinence at times and to prevent further skin breakdown. Foam dsg to sacrum clean dry and intact. Will continue to monitor and continue current POC.

## 2020-03-27 DIAGNOSIS — R262 Difficulty in walking, not elsewhere classified: Secondary | ICD-10-CM | POA: Diagnosis not present

## 2020-03-27 DIAGNOSIS — L89322 Pressure ulcer of left buttock, stage 2: Secondary | ICD-10-CM

## 2020-03-27 DIAGNOSIS — N390 Urinary tract infection, site not specified: Secondary | ICD-10-CM | POA: Diagnosis not present

## 2020-03-27 DIAGNOSIS — M79604 Pain in right leg: Secondary | ICD-10-CM | POA: Diagnosis not present

## 2020-03-27 DIAGNOSIS — B9620 Unspecified Escherichia coli [E. coli] as the cause of diseases classified elsewhere: Secondary | ICD-10-CM

## 2020-03-27 DIAGNOSIS — C7951 Secondary malignant neoplasm of bone: Secondary | ICD-10-CM | POA: Diagnosis not present

## 2020-03-27 LAB — URINE CULTURE: Culture: >=100000 — AB

## 2020-03-27 LAB — GLUCOSE, CAPILLARY
Glucose-Capillary: 109 mg/dL — ABNORMAL HIGH (ref 70–99)
Glucose-Capillary: 136 mg/dL — ABNORMAL HIGH (ref 70–99)
Glucose-Capillary: 151 mg/dL — ABNORMAL HIGH (ref 70–99)
Glucose-Capillary: 129 mg/dL — ABNORMAL HIGH (ref 70–99)

## 2020-03-27 LAB — CBC
HCT: 44.6 % (ref 36.0–46.0)
Hemoglobin: 13.6 g/dL (ref 12.0–15.0)
MCH: 28.8 pg (ref 26.0–34.0)
MCHC: 30.5 g/dL (ref 30.0–36.0)
MCV: 94.5 fL (ref 80.0–100.0)
Platelets: 227 10*3/uL (ref 150–400)
RBC: 4.72 MIL/uL (ref 3.87–5.11)
RDW: 20 % — ABNORMAL HIGH (ref 11.5–15.5)
WBC: 17.8 10*3/uL — ABNORMAL HIGH (ref 4.0–10.5)
nRBC: 0 % (ref 0.0–0.2)

## 2020-03-27 LAB — APTT
aPTT: 51 seconds — ABNORMAL HIGH (ref 24–36)
aPTT: 189 seconds (ref 24–36)

## 2020-03-27 LAB — HEPARIN LEVEL (UNFRACTIONATED)
Heparin Unfractionated: 0.41 IU/mL (ref 0.30–0.70)
Heparin Unfractionated: 0.74 IU/mL — ABNORMAL HIGH (ref 0.30–0.70)

## 2020-03-27 MED ORDER — CEFAZOLIN SODIUM-DEXTROSE 1-4 GM/50ML-% IV SOLN
1.0000 g | Freq: Three times a day (TID) | INTRAVENOUS | Status: AC
  Administered 2020-03-27 – 2020-03-29 (×7): 1 g via INTRAVENOUS
  Filled 2020-03-27 (×7): qty 50

## 2020-03-27 MED ORDER — DIPHENHYDRAMINE HCL 25 MG PO CAPS
25.0000 mg | ORAL_CAPSULE | Freq: Three times a day (TID) | ORAL | Status: DC | PRN
  Administered 2020-03-27 – 2020-04-03 (×2): 25 mg via ORAL
  Filled 2020-03-27 (×3): qty 1

## 2020-03-27 MED ORDER — HEPARIN (PORCINE) 25000 UT/250ML-% IV SOLN
1600.0000 [IU]/h | INTRAVENOUS | Status: AC
  Administered 2020-03-27: 1700 [IU]/h via INTRAVENOUS
  Administered 2020-03-27: 1900 [IU]/h via INTRAVENOUS
  Administered 2020-03-29: 1600 [IU]/h via INTRAVENOUS
  Filled 2020-03-27 (×3): qty 250

## 2020-03-27 MED ORDER — SPIRONOLACTONE 25 MG PO TABS
50.0000 mg | ORAL_TABLET | Freq: Every day | ORAL | Status: DC
  Administered 2020-03-27 – 2020-03-31 (×4): 50 mg via ORAL
  Filled 2020-03-27 (×4): qty 2

## 2020-03-27 MED ORDER — CITALOPRAM HYDROBROMIDE 20 MG PO TABS
30.0000 mg | ORAL_TABLET | Freq: Every day | ORAL | Status: DC
  Administered 2020-03-27 – 2020-04-13 (×18): 30 mg via ORAL
  Filled 2020-03-27 (×19): qty 2

## 2020-03-27 MED ORDER — FUROSEMIDE 20 MG PO TABS
20.0000 mg | ORAL_TABLET | Freq: Two times a day (BID) | ORAL | Status: DC
  Administered 2020-03-27 – 2020-03-31 (×8): 20 mg via ORAL
  Filled 2020-03-27 (×8): qty 1

## 2020-03-27 MED ORDER — FERROUS SULFATE 325 (65 FE) MG PO TABS
325.0000 mg | ORAL_TABLET | Freq: Every day | ORAL | Status: DC
  Administered 2020-03-27 – 2020-04-14 (×18): 325 mg via ORAL
  Filled 2020-03-27 (×18): qty 1

## 2020-03-27 NOTE — Plan of Care (Signed)
  Problem: Education: Goal: Knowledge of General Education information will improve Description: Including pain rating scale, medication(s)/side effects and non-pharmacologic comfort measures Outcome: Progressing   Problem: Clinical Measurements: Goal: Ability to maintain clinical measurements within normal limits will improve Outcome: Progressing   

## 2020-03-27 NOTE — Progress Notes (Signed)
ANTICOAGULATION CONSULT NOTE - Follow Up Consult  Pharmacy Consult for Heparin (Eliquis on hold) Indication: hx VTE, on chronic anticoagulation  Allergies  Allergen Reactions  . Xarelto [Rivaroxaban] Rash    Severe rash with itching  . Erythromycin Other (See Comments)    Oral Thrush  . Lipitor [Atorvastatin] Other (See Comments)    Leg cramps  . Pravachol [Pravastatin] Other (See Comments)    Leg muscle cramps  . Ivp Dye [Iodinated Diagnostic Agents] Rash    Patient Measurements: Height: 5\' 2"  (157.5 cm) Weight: 111.9 kg (246 lb 11.1 oz) IBW/kg (Calculated) : 50.1 Heparin Dosing Weight: 77.4 kg  Vital Signs: Temp: 98 F (36.7 C) (11/28 1130) Temp Source: Oral (11/28 1130) BP: 118/62 (11/28 1130) Pulse Rate: 91 (11/28 1130)  Labs: Recent Labs    03/24/20 2200 03/24/20 2200 03/25/20 0508 03/25/20 0508 03/26/20 0925 03/26/20 0925 03/26/20 1857 03/27/20 0244 03/27/20 1530  HGB 13.0   < > 13.4   < > 13.8  --   --  13.6  --   HCT 42.4   < > 44.5  --  45.8  --   --  44.6  --   PLT 183   < > 170  --  215  --   --  227  --   APTT  --   --   --   --  46*   < > 52* 51* 189*  HEPARINUNFRC  --   --   --   --  0.93*   < > 0.66 0.41 0.74*  CREATININE 1.01*  --  0.96  --   --   --   --   --   --    < > = values in this interval not displayed.    Estimated Creatinine Clearance: 65.3 mL/min (by C-G formula based on SCr of 0.96 mg/dL).   Medical History: Past Medical History:  Diagnosis Date  . Allergic rhinitis   . Anxiety   . Arthritis   . Bell's palsy   . Cataract   . Cirrhosis ([place])   . Coarse tremors   . Colon cancer ([place])   . Diabetes mellitus without complication ([place])   . Diverticulosis   . DVT (deep venous thrombosis) ([place])   . GERD (gastroesophageal reflux disease)   . GI bleed   . Hepatitis B   . History of blood transfusion 05/2019  . History of panic attacks   . Hypertension   . Insomnia   . Iron deficiency anemia   . MDD (major depressive  disorder)   . Morbid obesity ([place])   . Neuropathy   . Portal vein thrombosis   . Psoriasis   . Renal insufficiency    Assessment:  69 yr old female on Eliquis 5 mg BID prior to admission for hx DVT (2019).  To transition to IV heparin. Hx adenocarcinoma of sigmoid colon s/p resection in 07/2019.  MRI revealed sacral lesion, planning biopsy when off Eliquis for 48 hrs, tentatively 11/29.  Last Eliquis dose 11/26 at ~1:30pm.     Initiated IV heparin ~12 hrs after last Eliquis dose.  Will use aPTTs for monitoring, as heparin levels are expected to be falsely elevated due to recent Eliquis doses. Heparin drip 1600 units/hr and 6-hour aPTT 51sec. No issues with infusion or bleeding noted per nursing.  Heparin increased to 1900 uts/hr - running in Right arm and aptt drawn from left arm.   aptt elevated at 189 sec - no  bleeding - spoke to RN  Goal of Therapy:  Heparin level 0.3-0.7 units/ml aPTT 66-102 seconds Monitor platelets by anticoagulation protocol: Yes   Plan:  Decrease heparin infusion to 1700 U/hr Daily heparin level and aPTT until correlating; daily CBC. Will follow up for timing of biopsy and holding heparin prior to procedure.   Bonnita Nasuti Pharm.D. CPP, BCPS Clinical Pharmacist [phone] 03/27/2020 4:50 PM    Please check AMION for all [place] phone numbers After 10:00 PM, call [place] [phone]   03/27/2020,4:47 PM

## 2020-03-27 NOTE — Progress Notes (Signed)
PROGRESS NOTE  Tonya Dougherty SEG:[id number] DOB: 29-Aug-1950   PCP: Gay Filler, MD  Patient is from: Home. 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  She also have associated LLE weakness.  She reports urinary incontinence and bowel incontinence since she had colectomy.  She reports nausea and poor appetite.  She reports about 14 pounds weight loss but intentional.  She denies fever, injury or trauma or excessive night sweats.  She denies cough or shortness of breath.  In ED, vitals within normal.  Afebrile.  CMP and CBC with differential without significant finding.  Limited RVP screen negative.  UA with moderate LE and Hgb, nitrite and many bacteria.  Hip x-ray with mild osteoarthritis.  MRI thoracolumbar spine reveals sacral lesion on the left with probable early invasion of S1 and S2 neural foramina.  MRI also revealed degenerative changes at L4-5 with severe canal and bilateral subarticular stenosis with moderate L4 foraminal narrowing, left eccentric disc bulge and facet hypertrophy at L3-4 with resultant mild to moderate canal and left lateral recess stenosis and central to left subarticular disc protrusion at L1-2 with resultant mild to moderate spinal stenosis.   CT chest/abdomen/pelvis showed destructive left sacral lesion with associated 2.8 x 3.5 cm soft tissue component anterior to the left sacrum worrisome for metastasis, and 2.6 cm hypoenhancing mass in the right upper kidney.   Discussed finding with oncology, Dr. Alen Blew who suggested IR  biopsy of the sacral lesion but not impressed with the renal lesion.  Interventional radiology and neurosurgery consulted.  Neurosurgery recommended outpatient follow-up with canal and foraminal stenosis.  IR to do image guided biopsy of sacral lesion on 11/29.  Subjective: Seen and examined earlier this morning.  No major events overnight of this morning.  Reports a scaly itching and left lower extremity pain.  She rates her pain 3/10.  She described the pain as achy, not numbness or tingling or burning.  Pain divided from left lower back all the way down to her foot.  Objective: Vitals:   03/26/20 2050 03/27/20 0509 03/27/20 0729 03/27/20 1130  BP: (!) 129/57 114/68 117/64 118/62  Pulse: 77 82 88 91  Resp: 18 17 16 15   Temp: 98.3 F (36.8 C) 98.4 F (36.9 C) 98.2 F (36.8 C) 98 F (36.7 C)  TempSrc: Oral Oral Oral Oral  SpO2: 99% 94% 94% 96%  Weight:      Height:        Intake/Output Summary (Last 24 hours) at 03/27/2020 1152 Last data filed at 03/27/2020 0510 Gross per 24 hour  Intake 247.48 ml  Output 150 ml  Net 97.48 ml   Filed Weights   03/24/20 2140  Weight: 111.9 kg    Examination:  GENERAL: No apparent distress.  Nontoxic. HEENT: MMM.  Vision and hearing grossly intact.  NECK: Supple.  No apparent JVD.  RESP: On RA.  No IWOB.  Fair aeration bilaterally. CVS:  RRR. Heart sounds normal.  ABD/GI/GU: BS+. Abd soft, NTND.  MSK/EXT:  Moves extremities. No apparent deformity. No edema.  SKIN: Mild intertrigo under abdominal  skin folds and bilateral breasts NEURO: Awake, alert and oriented appropriately.  Motor 4+/5 in BLE.  Light sensation intact.  Patellar and Achilles reflex intact. PSYCH: Calm. Normal affect.  RN Ander Purpura present for chaperone during exam.  Procedures:  None  Microbiology summarized: QIONG-29 and influenza PCR nonreactive. Urine culture with 1000 E. coli  Assessment & Plan: Ambulatory dysfunction/LLE pain and weakness-seems radiculopathic.   MRI thoracolumbar spine reveals sacral lesion on the left with probable early invasion of S1 and S2 neural foramina.  MRI also revealed degenerative changes at L4-5 with severe canal and bilateral subarticular stenosis with moderate L4 foraminal narrowing, left eccentric disc bulge and facet hypertrophy at L3-4 with resultant mild to moderate canal and left lateral recess stenosis and central to left subarticular disc protrusion at L1-2 with resultant mild to moderate spinal stenosis.  Pain resolved. -Neurosurgery PA, Kimberley Meyran recommended outpatient follow-up. -IR consulted for sacral biopsy-likely on 11/29 after Eliquis washout. -Pain control with as needed Tylenol and Percocet -Continue increased dose of gabapentin at 600 mg 3 times daily -PT/OT-recommended SNF.  Sacral lesion concerning for neoplasm: MRI of lumbar spine revealed sacral lesions with invasion of S1 and S2.  No primary source on CT chest/abdomen/pelvis.  Reports urinary and fecal incontinence but this has been going on since she had colectomy. -Discussed with oncology, Dr. Alen Blew who recommended IR biopsy -Plan for image guided biopsy by IR on 11/29.  Adenocarcinoma of sigmoid colon s/p sigmoidectomy in April 2021.  Pathology with clean margins.  -Needs outpatient follow-up with GI  Acute cystitis with hematuria?  UA concerning but no acute UTI symptoms other than chronic urinary incontinence.  Urine culture with pansensitive E. coli. -Ceftriaxone 11/26> 11/28.>>  Ancef through 11/30.   Essential hypertension: Normotensive -Continue home medications  Controlled DM-2 with hyperglycemia: A1c 6.4%. Recent Labs  Lab 03/26/20 1124 03/26/20 1606 03/26/20 2049 03/27/20 0716 03/27/20 1131  GLUCAP 140* 181* 150* 109* 136*  -Continue current insulin regimen  GERD without esophagitis -Continue PPI  Tobacco use disorder: -Cessation counseling and nicotine patch   History of DVT (deep vein thrombosis): On  Eliquis. -Now on IV heparin for IR biopsy  Candidal intertrigo -Received Diflucan x2. -Continue nystatin powder and aeration  Pruritus -Continue Benadryl as needed  Anxiety and depression: Stable. -Continue home medications  Leukocytosis: Likely demargination from recent steroid for IV contrast -Continue monitoring  Morbid obesity Body mass index is 45.12 kg/m. Nutrition Problem: Increased nutrient needs Etiology: cancer and cancer related treatments-Encourage lifestyle change to lose weight. Signs/Symptoms: estimated needs-Could benefit from GLP-1 inhibitors given diabetes. Interventions: Ensure Enlive (each supplement provides 350kcal and 20 grams of protein), MVI   Stage II sacral pressure injury: POA Pressure Injury 03/25/20 Sacrum Right;Left Stage 2 -  Partial thickness loss of dermis presenting as a shallow open injury with a red, pink wound bed without slough. (Active)  03/25/20 0430  Location: Sacrum  Location Orientation: Right;Left  Staging: Stage 2 -  Partial thickness loss of dermis presenting as a shallow open injury with a red, pink wound bed without slough.  Wound Description (Comments):   Present on Admission: Yes   DVT prophylaxis:    Code Status: Full code Family Communication: Patient and/or RN. Available if any question.  Status is: Inpatient  Remains inpatient appropriate because:Ongoing diagnostic testing needed not appropriate for outpatient work up, Unsafe d/c plan, IV treatments appropriate due to intensity of illness or inability to take PO and Inpatient level of care appropriate due to severity  of illness   Dispo: The patient is from: Home              Anticipated d/c is to: SNF              Anticipated d/c date is: 2 days              Patient currently is not medically stable to d/c.               Consultants:  Neurosurgery over the phone Oncology over the phone Interventional radiology   Sch Meds:  Scheduled Meds: .  carvedilol  3.125 mg Oral BID WC  . citalopram  30 mg Oral QHS  . feeding supplement  237 mL Oral BID BM  . ferrous sulfate  325 mg Oral Q breakfast  . furosemide  20 mg Oral BID  . gabapentin  600 mg Oral TID  . insulin aspart  0-15 Units Subcutaneous TID AC & HS  . insulin glargine  40 Units Subcutaneous Q2200  . multivitamin with minerals  1 tablet Oral Daily  . nystatin   Topical BID  . pantoprazole  40 mg Oral Daily  . spironolactone  50 mg Oral Daily  . Zinc Oxide   Topical BID   Continuous Infusions: . cefTRIAXone (ROCEPHIN)  IV 1 g (03/27/20 0109)  . heparin 1,900 Units/hr (03/27/20 0845)   PRN Meds:.acetaminophen **OR** acetaminophen, diphenhydrAMINE, fluticasone, ondansetron **OR** ondansetron (ZOFRAN) IV, oxyCODONE-acetaminophen, polyethylene glycol, polyvinyl alcohol  Antimicrobials: Anti-infectives (From admission, onward)   Start     Dose/Rate Route Frequency Ordered Stop   03/26/20 0000  cefTRIAXone (ROCEPHIN) 1 g in sodium chloride 0.9 % 100 mL IVPB        1 g 200 mL/hr over 30 Minutes Intravenous Every 24 hours 03/25/20 0333 03/29/20 2359   03/25/20 1000  fluconazole (DIFLUCAN) tablet 150 mg  Status:  Discontinued        150 mg Oral Daily 03/25/20 0556 03/27/20 0715   03/25/20 0045  cefTRIAXone (ROCEPHIN) 1 g in sodium chloride 0.9 % 100 mL IVPB        1 g 200 mL/hr over 30 Minutes Intravenous  Once 03/25/20 0037 03/25/20 0158       I have personally reviewed the following labs and images: CBC: Recent Labs  Lab 03/24/20 2200 03/25/20 0508 03/26/20 0925 03/27/20 0244  WBC 8.4 8.3 12.3* 17.8*  NEUTROABS 5.6 6.1  --   --   HGB 13.0 13.4 13.8 13.6  HCT 42.4 44.5 45.8 44.6  MCV 94.6 94.7 95.4 94.5  PLT 183 170 215 227   BMP &GFR Recent Labs  Lab 03/24/20 2200 03/25/20 0508  NA 140 140  K 4.1 3.9  CL 106 104  CO2 22 23  GLUCOSE 171* 127*  BUN 27* 22  CREATININE 1.01* 0.96  CALCIUM 9.2 9.2  MG  --  2.1   Estimated Creatinine Clearance: 65.3  mL/min (by C-G formula based on SCr of 0.96 mg/dL). Liver & Pancreas: Recent Labs  Lab 03/24/20 2200 03/25/20 0508  AST 23 19  ALT 14 14  ALKPHOS 130* 123  BILITOT 0.8 1.3*  PROT 6.9 7.0  ALBUMIN 2.9* 2.8*   No results for input(s): LIPASE, AMYLASE in the last 168 hours. No results for input(s): AMMONIA in the last 168 hours. Diabetic: Recent Labs    03/25/20 0508  HGBA1C 6.4*   Recent Labs  Lab 03/26/20 1124 03/26/20 1606 03/26/20 2049 03/27/20 0716 03/27/20 1131  GLUCAP  140* 181* 150* 109* 136*   Cardiac Enzymes: No results for input(s): CKTOTAL, CKMB, CKMBINDEX, TROPONINI in the last 168 hours. No results for input(s): PROBNP in the last 8760 hours. Coagulation Profile: No results for input(s): INR, PROTIME in the last 168 hours. Thyroid Function Tests: No results for input(s): TSH, T4TOTAL, FREET4, T3FREE, THYROIDAB in the last 72 hours. Lipid Profile: No results for input(s): CHOL, HDL, LDLCALC, TRIG, CHOLHDL, LDLDIRECT in the last 72 hours. Anemia Panel: No results for input(s): VITAMINB12, FOLATE, FERRITIN, TIBC, IRON, RETICCTPCT in the last 72 hours. Urine analysis:    Component Value Date/Time   COLORURINE YELLOW 03/24/2020 2311   APPEARANCEUR CLOUDY (A) 03/24/2020 2311   LABSPEC 1.018 03/24/2020 2311   PHURINE 5.0 03/24/2020 2311   GLUCOSEU NEGATIVE 03/24/2020 2311   HGBUR MODERATE (A) 03/24/2020 2311   BILIRUBINUR NEGATIVE 03/24/2020 2311   [place] 03/24/2020 2311   PROTEINUR 30 (A) 03/24/2020 2311   NITRITE POSITIVE (A) 03/24/2020 2311   LEUKOCYTESUR MODERATE (A) 03/24/2020 2311   Sepsis Labs: Invalid input(s): PROCALCITONIN, [place]  Microbiology: Recent Results (from the past 240 hour(s))  Culture, Urine     Status: Abnormal   Collection Time: 03/24/20 11:11 PM   Specimen: Urine, Clean Catch  Result Value Ref Range Status   Specimen Description URINE, CLEAN CATCH  Final   Special Requests   Final    NONE Performed at  [facility], [address], [place], [place] [address]    Culture >=100,000 COLONIES/mL ESCHERICHIA COLI (A)  Final   Report Status 03/27/2020 FINAL  Final   Organism ID, Bacteria ESCHERICHIA COLI (A)  Final      Susceptibility   Escherichia coli - MIC*    AMPICILLIN 4 SENSITIVE Sensitive     CEFAZOLIN <=4 SENSITIVE Sensitive     CEFEPIME <=0.12 SENSITIVE Sensitive     CEFTRIAXONE <=0.25 SENSITIVE Sensitive     CIPROFLOXACIN <=0.25 SENSITIVE Sensitive     GENTAMICIN <=1 SENSITIVE Sensitive     IMIPENEM <=0.25 SENSITIVE Sensitive     NITROFURANTOIN <=16 SENSITIVE Sensitive     TRIMETH/SULFA <=20 SENSITIVE Sensitive     AMPICILLIN/SULBACTAM <=2 SENSITIVE Sensitive     PIP/TAZO <=4 SENSITIVE Sensitive     * >=100,000 COLONIES/mL ESCHERICHIA COLI  Resp Panel by RT-PCR (Flu A&B, Covid) Nasopharyngeal Swab     Status: None   Collection Time: 03/25/20  2:36 AM   Specimen: Nasopharyngeal Swab; Nasopharyngeal(NP) swabs in vial transport medium  Result Value Ref Range Status   SARS Coronavirus 2 by RT PCR NEGATIVE NEGATIVE Final    Comment: (NOTE) SARS-CoV-2 target nucleic acids are NOT DETECTED.  The SARS-CoV-2 RNA is generally detectable in upper respiratory specimens during the acute phase of infection. The lowest concentration of SARS-CoV-2 viral copies this assay can detect is 138 copies/mL. A negative result does not preclude SARS-Cov-2 infection and should not be used as the sole basis for treatment or other patient management decisions. A negative result may occur with  improper specimen collection/handling, submission of specimen other than nasopharyngeal swab, presence of viral mutation(s) within the areas targeted by this assay, and inadequate number of viral copies(<138 copies/mL). A negative result must be combined with clinical observations, patient history, and epidemiological information. The expected result is Negative.  Fact Sheet for Patients:   EntrepreneurPulse.com.au  Fact Sheet for Healthcare Providers:  IncredibleEmployment.be  This test is no t yet approved or cleared by the [place] and  has been authorized  for detection and/or diagnosis of SARS-CoV-2 by FDA under an Emergency Use Authorization (EUA). This EUA will remain  in effect (meaning this test can be used) for the duration of the COVID-19 declaration under Section 564(b)(1) of the Act, 21 U.S.C.section 360bbb-3(b)(1), unless the authorization is terminated  or revoked sooner.       Influenza A by PCR NEGATIVE NEGATIVE Final   Influenza B by PCR NEGATIVE NEGATIVE Final    Comment: (NOTE) The Xpert Xpress SARS-CoV-2/FLU/RSV plus assay is intended as an aid in the diagnosis of influenza from Nasopharyngeal swab specimens and should not be used as a sole basis for treatment. Nasal washings and aspirates are unacceptable for Xpert Xpress SARS-CoV-2/FLU/RSV testing.  Fact Sheet for Patients: EntrepreneurPulse.com.au  Fact Sheet for Healthcare Providers: IncredibleEmployment.be  This test is not yet approved or cleared by the [place] FDA and has been authorized for detection and/or diagnosis of SARS-CoV-2 by FDA under an Emergency Use Authorization (EUA). This EUA will remain in effect (meaning this test can be used) for the duration of the COVID-19 declaration under Section 564(b)(1) of the Act, 21 U.S.C. section 360bbb-3(b)(1), unless the authorization is terminated or revoked.  Performed at [facility], [place] [address] [place],  [address]     Radiology Studies: No results found.   Dabria Wadas T. [place]  If 7PM-7AM, please contact night-coverage [email or web address] 03/27/2020, 11:52 AM

## 2020-03-27 NOTE — Progress Notes (Addendum)
ANTICOAGULATION CONSULT NOTE - Follow Up Consult  Pharmacy Consult for Heparin (Eliquis on hold) Indication: hx VTE, on chronic anticoagulation  Allergies  Allergen Reactions  . Xarelto [Rivaroxaban] Rash    Severe rash with itching  . Erythromycin Other (See Comments)    Oral Thrush  . Lipitor [Atorvastatin] Other (See Comments)    Leg cramps  . Pravachol [Pravastatin] Other (See Comments)    Leg muscle cramps  . Ivp Dye [Iodinated Diagnostic Agents] Rash    Patient Measurements: Height: 5\' 2"  (157.5 cm) Weight: 111.9 kg (246 lb 11.1 oz) IBW/kg (Calculated) : 50.1 Heparin Dosing Weight: 77.4 kg  Vital Signs: Temp: 98.2 F (36.8 C) (11/28 0729) Temp Source: Oral (11/28 0729) BP: 117/64 (11/28 0729) Pulse Rate: 88 (11/28 0729)  Labs: Recent Labs    03/24/20 2200 03/24/20 2200 03/25/20 0508 03/25/20 0508 03/26/20 0925 03/26/20 1857 03/27/20 0244  HGB 13.0   < > 13.4   < > 13.8  --  13.6  HCT 42.4   < > 44.5  --  45.8  --  44.6  PLT 183   < > 170  --  215  --  227  APTT  --   --   --   --  46* 52* 51*  HEPARINUNFRC  --   --   --   --  0.93* 0.66 0.41  CREATININE 1.01*  --  0.96  --   --   --   --    < > = values in this interval not displayed.    Estimated Creatinine Clearance: 65.3 mL/min (by C-G formula based on SCr of 0.96 mg/dL).   Medical History: Past Medical History:  Diagnosis Date  . Allergic rhinitis   . Anxiety   . Arthritis   . Bell's palsy   . Cataract   . Cirrhosis ([place])   . Coarse tremors   . Colon cancer ([place])   . Diabetes mellitus without complication ([place])   . Diverticulosis   . DVT (deep venous thrombosis) ([place])   . GERD (gastroesophageal reflux disease)   . GI bleed   . Hepatitis B   . History of blood transfusion 05/2019  . History of panic attacks   . Hypertension   . Insomnia   . Iron deficiency anemia   . MDD (major depressive disorder)   . Morbid obesity ([place])   . Neuropathy   . Portal vein thrombosis   . Psoriasis    . Renal insufficiency    Assessment:  69 yr old female on Eliquis 5 mg BID prior to admission for hx DVT (2019).  To transition to IV heparin. Hx adenocarcinoma of sigmoid colon s/p resection in 07/2019.  MRI revealed sacral lesion, planning biopsy when off Eliquis for 48 hrs, tentatively 11/29.  Last Eliquis dose 11/26 at ~1:30pm.     Initiated IV heparin ~12 hrs after last Eliquis dose.  Will use aPTTs for monitoring, as heparin levels are expected to be falsely elevated due to recent Eliquis doses.Heparin drip 1600 units/hr and 6-hour aPTT 51sec. No issues with infusion or bleeding noted per nursing. Will increase infusion by ~4 u/kg/hr to 1900 units/hr. Will check 8-hour aPTT/heparin level.  Goal of Therapy:  Heparin level 0.3-0.7 units/ml aPTT 66-102 seconds Monitor platelets by anticoagulation protocol: Yes   Plan:  Increase heparin infusion to 1900 U/hr Check 8-hour heparin level/aPTT Daily heparin level and aPTT until correlating; daily CBC. Will follow up for timing of biopsy and holding heparin  prior to procedure.  Alfonse Spruce, PharmD PGY2 ID Pharmacy Resident Phone between 7 am - 3:30 pm: [phone]  Please check AMION for all [place] phone numbers After 10:00 PM, call [place] [phone]   03/27/2020,7:46 AM

## 2020-03-27 NOTE — Progress Notes (Signed)
Pharmacy Antibiotic Note  Tonya Dougherty is a 69 y.o. female admitted on 03/24/2020 with UTI.  Pharmacy has been consulted for cefazolin dosing. Patient has been on ceftriaxone, transitioning to cefazolin today per sensitive cultures. Will start cefazolin 1g q8h through 11/30 based on patient's clinical status and renal function.  Plan: Cefazolin 1g q8h thru 11/30  Height: 5\' 2"  (157.5 cm) Weight: 111.9 kg (246 lb 11.1 oz) IBW/kg (Calculated) : 50.1  Temp (24hrs), Avg:98.2 F (36.8 C), Min:98 F (36.7 C), Max:98.4 F (36.9 C)  Recent Labs  Lab 03/24/20 2200 03/25/20 0508 03/26/20 0925 03/27/20 0244  WBC 8.4 8.3 12.3* 17.8*  CREATININE 1.01* 0.96  --   --     Estimated Creatinine Clearance: 65.3 mL/min (by C-G formula based on SCr of 0.96 mg/dL).    Allergies  Allergen Reactions  . Xarelto [Rivaroxaban] Rash    Severe rash with itching  . Erythromycin Other (See Comments)    Oral Thrush  . Lipitor [Atorvastatin] Other (See Comments)    Leg cramps  . Pravachol [Pravastatin] Other (See Comments)    Leg muscle cramps  . Ivp Dye [Iodinated Diagnostic Agents] Rash   Microbiology results: Ucx: 11/25 E. Coli pan-sensitive   Thank you for allowing pharmacy to be a part of this patient's care.  Alfonse Spruce, PharmD PGY2 ID Pharmacy Resident Phone between 7 am - 3:30 pm: [phone]  Please check AMION for all [place] phone numbers After 10:00 PM, call [place] [phone]  03/27/2020 12:15 PM

## 2020-03-27 NOTE — Plan of Care (Addendum)
CRITICAL VALUE ALERT  Critical Value: PTT 189  Date & Time Notied:  11/28 @ 1600  Provider Notified: Dr. Cyndia Skeeters  Orders Received/Actions taken:  Per Cyndia Skeeters, consult Pharmacy, pharmacy changed heparin rate to 43ml/hr     Problem: Education: Goal: Knowledge of General Education information will improve Description: Including pain rating scale, medication(s)/side effects and non-pharmacologic comfort measures Outcome: Progressing   Problem: Activity: Goal: Risk for activity intolerance will decrease Outcome: Progressing   Problem: Pain Managment: Goal: General experience of comfort will improve Outcome: Progressing   Problem: Safety: Goal: Ability to remain free from injury will improve Outcome: Progressing   Problem: Skin Integrity: Goal: Risk for impaired skin integrity will decrease Outcome: Progressing

## 2020-03-28 ENCOUNTER — Other Ambulatory Visit: Payer: Self-pay

## 2020-03-28 ENCOUNTER — Inpatient Hospital Stay (HOSPITAL_COMMUNITY): Payer: Medicare HMO

## 2020-03-28 DIAGNOSIS — C7951 Secondary malignant neoplasm of bone: Secondary | ICD-10-CM | POA: Diagnosis not present

## 2020-03-28 DIAGNOSIS — M79604 Pain in right leg: Secondary | ICD-10-CM | POA: Diagnosis not present

## 2020-03-28 DIAGNOSIS — N390 Urinary tract infection, site not specified: Secondary | ICD-10-CM | POA: Diagnosis not present

## 2020-03-28 DIAGNOSIS — R262 Difficulty in walking, not elsewhere classified: Secondary | ICD-10-CM | POA: Diagnosis not present

## 2020-03-28 LAB — CBC
HCT: 43.2 % (ref 36.0–46.0)
Hemoglobin: 13.4 g/dL (ref 12.0–15.0)
MCH: 29.1 pg (ref 26.0–34.0)
MCHC: 31 g/dL (ref 30.0–36.0)
MCV: 93.7 fL (ref 80.0–100.0)
Platelets: 214 10*3/uL (ref 150–400)
RBC: 4.61 MIL/uL (ref 3.87–5.11)
RDW: 20 % — ABNORMAL HIGH (ref 11.5–15.5)
WBC: 9.6 10*3/uL (ref 4.0–10.5)
nRBC: 0 % (ref 0.0–0.2)

## 2020-03-28 LAB — GLUCOSE, CAPILLARY
Glucose-Capillary: 103 mg/dL — ABNORMAL HIGH (ref 70–99)
Glucose-Capillary: 125 mg/dL — ABNORMAL HIGH (ref 70–99)
Glucose-Capillary: 93 mg/dL (ref 70–99)
Glucose-Capillary: 177 mg/dL — ABNORMAL HIGH (ref 70–99)

## 2020-03-28 LAB — APTT: aPTT: 103 seconds — ABNORMAL HIGH (ref 24–36)

## 2020-03-28 LAB — HEPARIN LEVEL (UNFRACTIONATED): Heparin Unfractionated: 0.6 IU/mL (ref 0.30–0.70)

## 2020-03-28 IMAGING — CT CT BIOPSY
1 of 2 series · 15 of 32 positions shown, 19 images · non-contrast
Comparison: none

INDICATION: History of colon cancer, enlarging sacral lesion concerning for
metastatic disease

[Series 2: i-spiral 5.0 b40f · axial · 0.98mm/px · z∈[+980,+1152]mm · 15 of 55 slices shown, 19 images]
[im 3/55  soft-tissue]
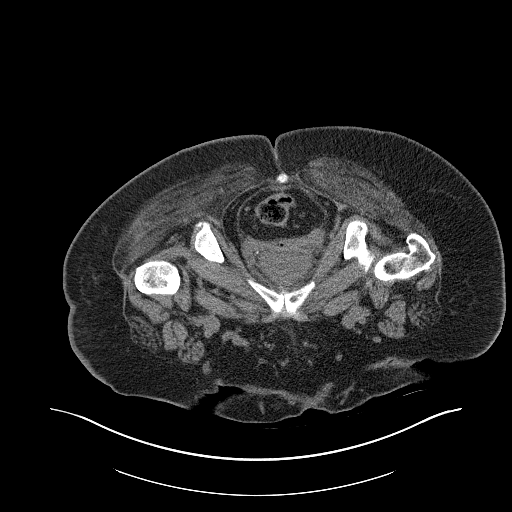
[im 3/55  bone]
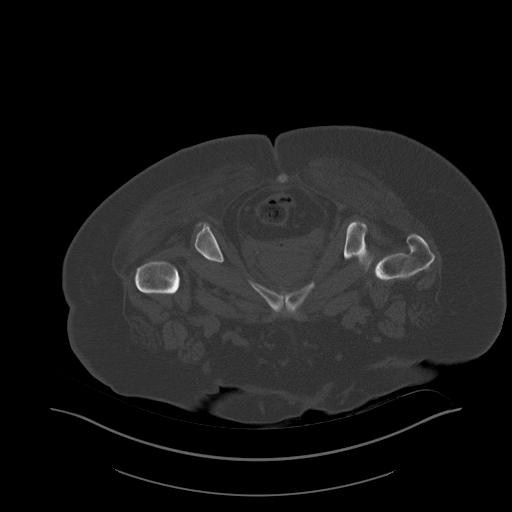
[im 7/55  soft-tissue]
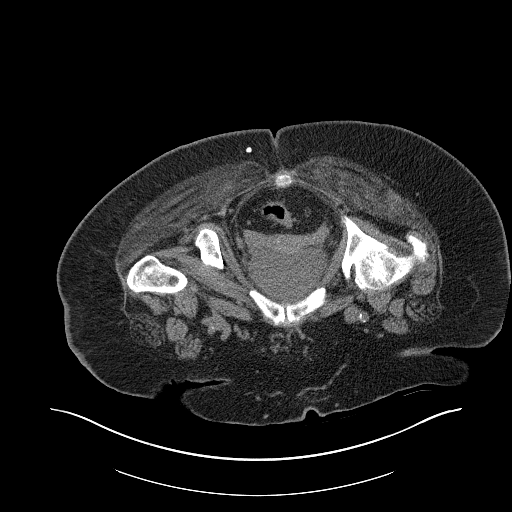
[im 11/55  soft-tissue]
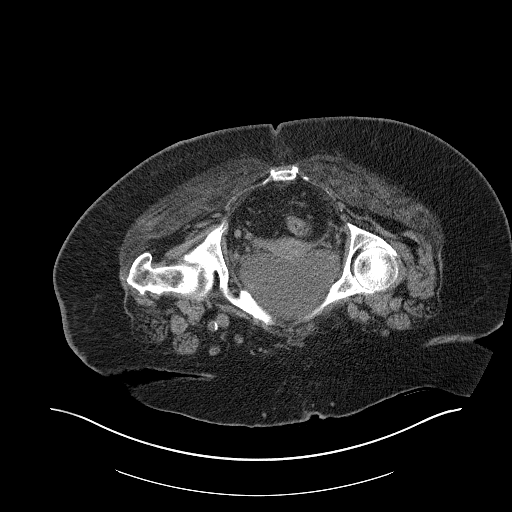
[im 16/55  soft-tissue]
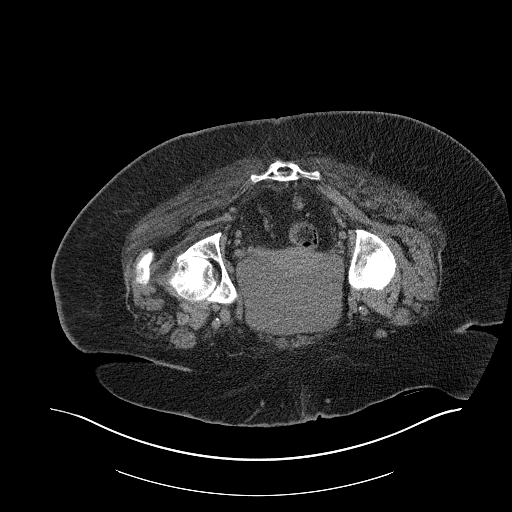
[im 20/55  soft-tissue]
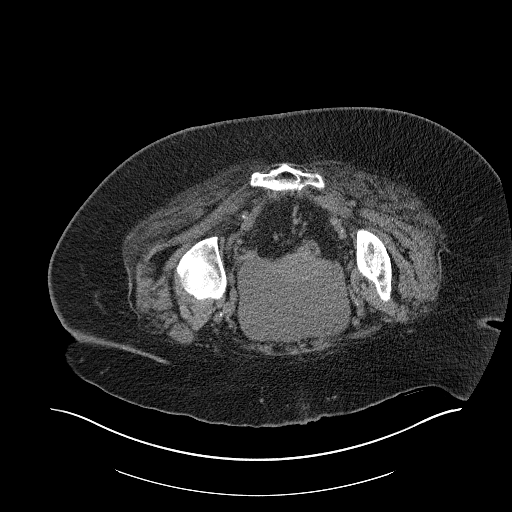
[im 24/55  soft-tissue]
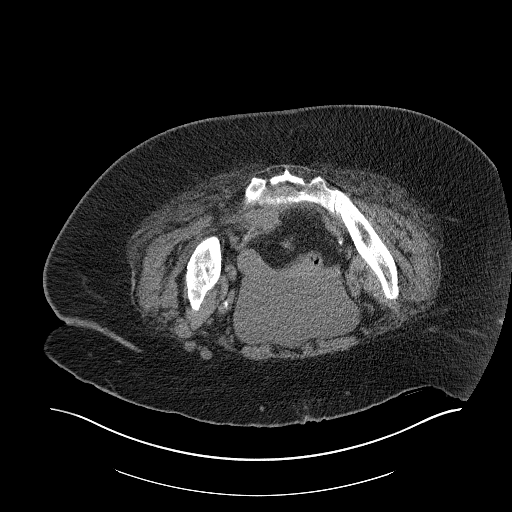
[im 29/55  soft-tissue]
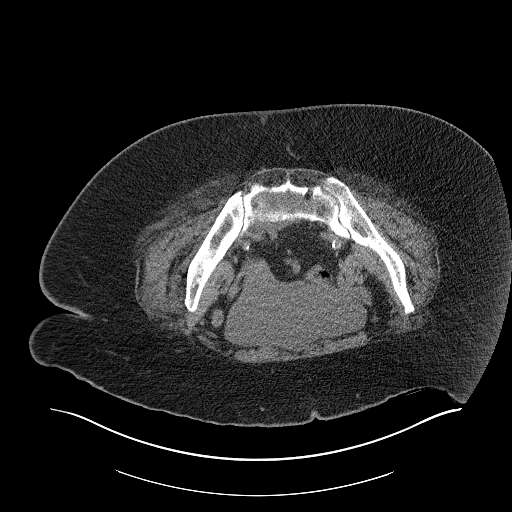
[im 31/55  soft-tissue]
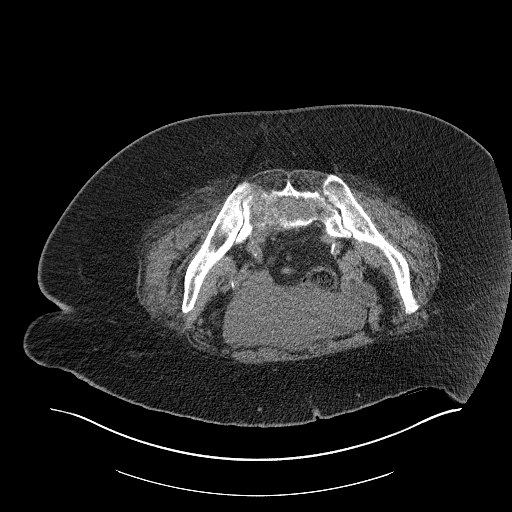
[im 35/55  soft-tissue]
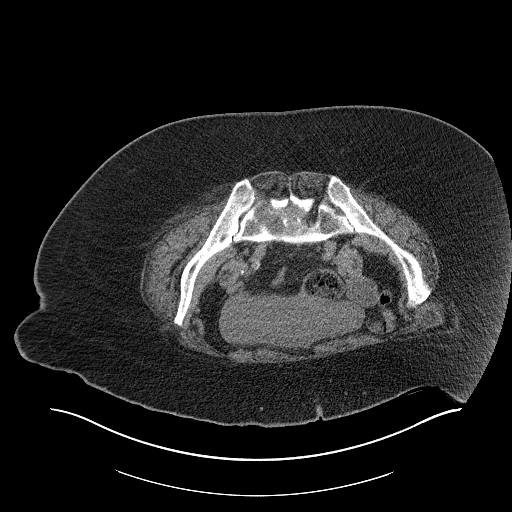
[im 35/55  bone]
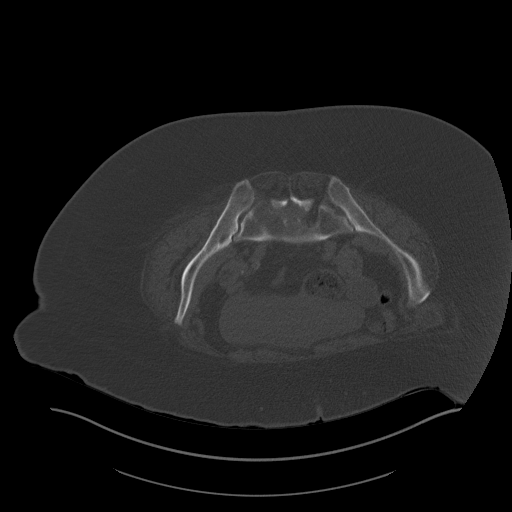
[im 39/55  soft-tissue]
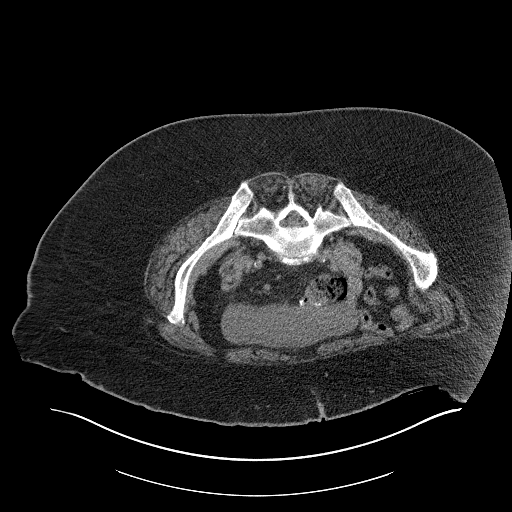
[im 44/55  soft-tissue]
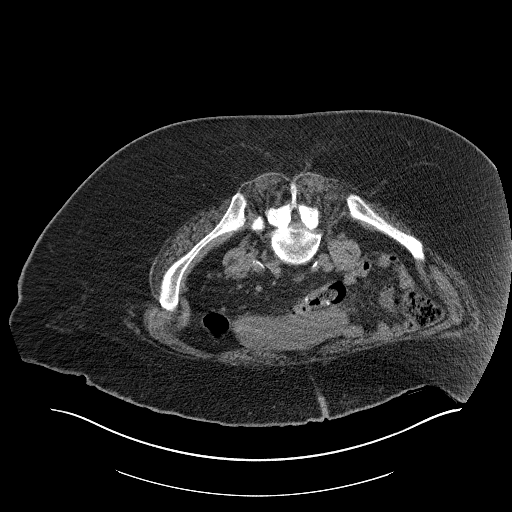
[im 46/55  lung]
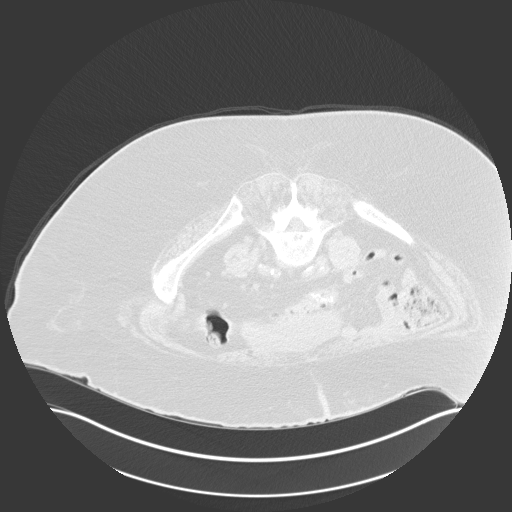
[im 48/55  soft-tissue]
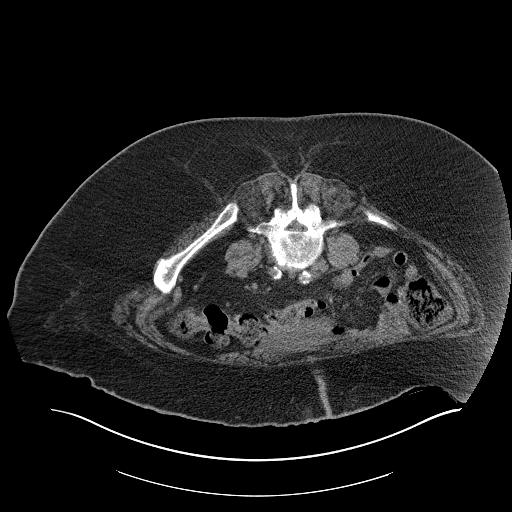
[im 48/55  lung]
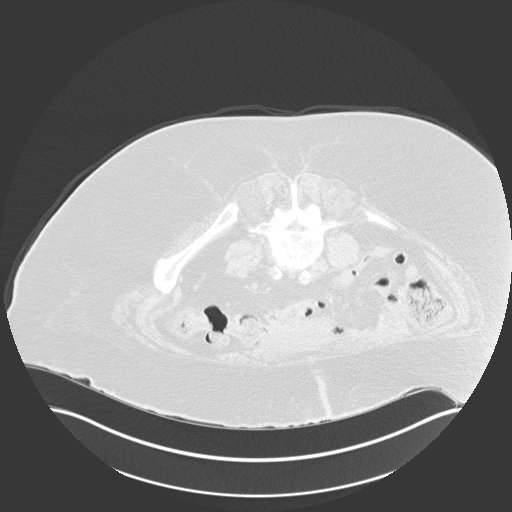
[im 50/55  lung]
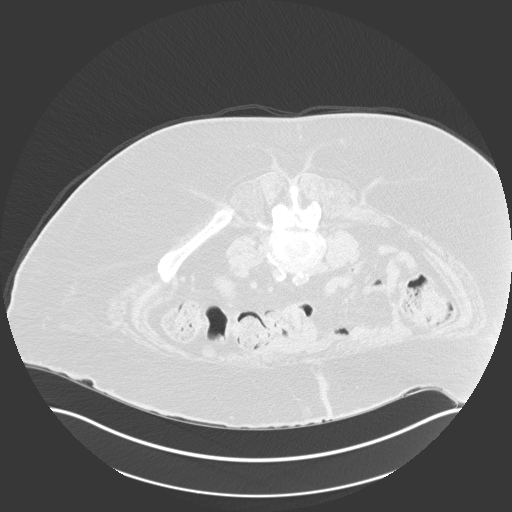
[im 52/55  soft-tissue]
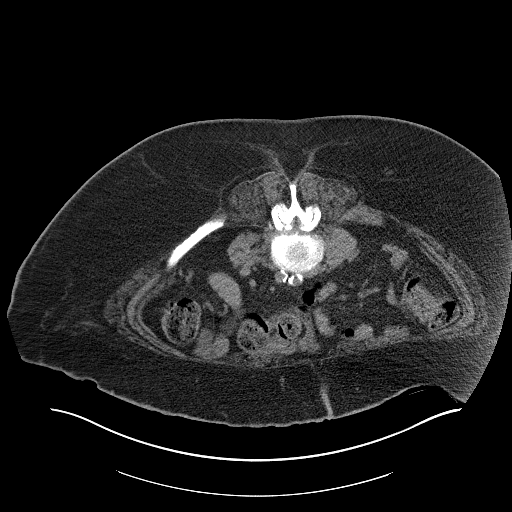
[im 52/55  lung]
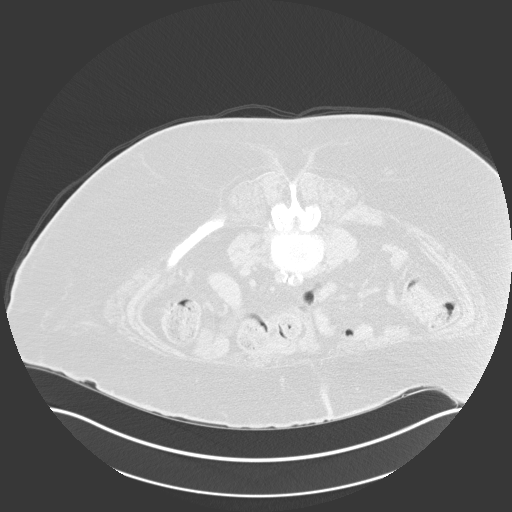

[15 of 32 positions shown; findings below may reference images not displayed]

EXAM:
CT core BIOPSY LEFT SACRAL MASS

MEDICATIONS:
1% LIDOCAINE LOCAL

ANESTHESIA/SEDATION:
Moderate (conscious) sedation was employed during this procedure. A
total of Versed 1.5 mg and Fentanyl 50 mcg was administered
intravenously.

Moderate Sedation Time: 15 minutes. The patient's level of
consciousness and vital signs were monitored continuously by
radiology nursing throughout the procedure under my direct
supervision.

FLUOROSCOPY TIME:  Fluoroscopy Time: NONE.

COMPLICATIONS:
None immediate.

PROCEDURE:
Informed written consent was obtained from the patient after a
thorough discussion of the procedural risks, benefits and
alternatives. All questions were addressed. Maximal Sterile Barrier
Technique was utilized including caps, mask, sterile gowns, sterile
gloves, sterile drape, hand hygiene and skin antiseptic. A timeout
was performed prior to the initiation of the procedure.

previous imaging reviewed. patient positioned prone. noncontrast
localization CT performed. the destructive left sacral soft tissue
mass was localized and marked for a posterior approach.

Under sterile conditions and local anesthesia, a 17 gauge coaxial
guide was advanced to the left sacral lesion soft tissue component.
Needle position confirmed with CT. Several 18 gauge core biopsies
attempted. Sampling was fragmented and placed in formalin. Needle
removed. Postprocedure imaging demonstrates no hemorrhage or
hematoma. Patient tolerated biopsy well.
IMPRESSION: Successful CT-guided left sacral mass 18 gauge core biopsy

## 2020-03-28 MED ORDER — FENTANYL CITRATE (PF) 100 MCG/2ML IJ SOLN
INTRAMUSCULAR | Status: AC
  Filled 2020-03-28: qty 4

## 2020-03-28 MED ORDER — MIDAZOLAM HCL 2 MG/2ML IJ SOLN
INTRAMUSCULAR | Status: AC
  Filled 2020-03-28: qty 4

## 2020-03-28 MED ORDER — MIDAZOLAM HCL 2 MG/2ML IJ SOLN
INTRAMUSCULAR | Status: AC | PRN
  Administered 2020-03-28: 0.5 mg via INTRAVENOUS
  Administered 2020-03-28: 1 mg via INTRAVENOUS

## 2020-03-28 MED ORDER — HYDROMORPHONE HCL 1 MG/ML IJ SOLN
0.5000 mg | INTRAMUSCULAR | Status: DC | PRN
  Administered 2020-03-29 – 2020-03-31 (×4): 0.5 mg via INTRAVENOUS
  Filled 2020-03-28 (×4): qty 0.5

## 2020-03-28 MED ORDER — FENTANYL CITRATE (PF) 100 MCG/2ML IJ SOLN
INTRAMUSCULAR | Status: AC | PRN
Start: 2020-03-28 — End: 2020-03-28
  Administered 2020-03-28: 50 ug via INTRAVENOUS

## 2020-03-28 NOTE — Consult Note (Signed)
Chief Complaint: Patient was seen in consultation today for  Chief Complaint  Patient presents with  . Weakness  . Fatigue    Referring Physician(s): Dr.  Cyndia Skeeters  Supervising Physician: Daryll Brod  Patient Status: [facility] - In-pt  History of Present Illness: Tonya Dougherty is a 69 y.o. 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 She presented to the ED 03/24/20 with progressive LLE pain radiating from her left buttock down to her foot over the last 3 months that has acutely gotten worse over the last week. Imaging was obtained.   MR Lumbar Spine 03/24/20 MR LUMBAR SPINE IMPRESSION: 1. Abnormal T2/STIR hyperintense lesion involving the central and left aspect of the sacrum, concerning for osseous metastatic disease. Probable early invasion of the adjacent left S1 and S2 neural foramina, with extraosseous extension into the adjacent presacral space. No visible pathologic fracture. Finding could be further assessed with dedicated MRI of the sacrum/pelvis as clinically warranted. 2. No other metastatic disease within the lumbar spine. 3. 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 4. Left eccentric disc bulge and facet hypertrophy at L3-4 with resultant mild to moderate canal and left lateral recess stenosis. 5. Central to left subarticular disc protrusion at L1-2 with resultant mild to moderate spinal stenosis.  CT chest/abdomen/pelvis with contrast 03/25/20 IMPRESSION: 1. Status post left hemicolectomy. 2. Destructive left sacral lesion, better evaluated on recent MRI. Associated 2.8 x 3.5 cm soft tissue component anterior to the left sacrum, mildly increased. This appearance remains worrisome for metastasis. 3. 2.6 cm hypoenhancing mass  in the right upper kidney, essentially new. This appearance may reflect infection, but if neoplastic, would favor metastasis over renal cell carcinoma. 4. Trace right pleural effusion. 5. Cirrhosis. Splenomegaly. No abdominopelvic ascites. 6. Cholelithiasis, without associated inflammatory changes  Interventional Radiology has been asked to evaluate this patient for an image-guided left sacral lesion biopsy for further work up and evaluation. This case has been reviewed and procedure approved by Dr. Serafina Royals.    Past Medical History:  Diagnosis Date  . Allergic rhinitis   . Anxiety   . Arthritis   . Bell's palsy   . Cataract   . Cirrhosis ([place])   . Coarse tremors   . Colon cancer ([place])   . Diabetes mellitus without complication ([place])   . Diverticulosis   . DVT (deep venous thrombosis) ([place])   . GERD (gastroesophageal reflux disease)   . GI bleed   . Hepatitis B   . History of blood transfusion 05/2019  . History of panic attacks   . Hypertension   . Insomnia   . Iron deficiency anemia   . MDD (major depressive disorder)   . Morbid obesity ([place])   . Neuropathy   . Portal vein thrombosis   . Psoriasis   . Renal insufficiency     Past Surgical History:  Procedure Laterality Date  . ANKLE SURGERY Right   . CATARACT EXTRACTION, BILATERAL    . COLONOSCOPY WITH ESOPHAGOGASTRODUODENOSCOPY (EGD)  05/2019  . FLEXIBLE SIGMOIDOSCOPY N/A 08/14/2019   Procedure: FLEXIBLE SIGMOIDOSCOPY;  Surgeon: Ileana Roup, MD;  Location: [facility];  Service: General;  Laterality: N/A;    Allergies: Xarelto [rivaroxaban], Erythromycin, Lipitor [atorvastatin], Pravachol [pravastatin], and Ivp dye [iodinated diagnostic agents]  Medications: Prior to Admission medications   Medication Sig Start Date  End Date Taking? Authorizing Provider  acetaminophen (TYLENOL) 500 MG tablet Take 1,000 mg by mouth every 6 (six) hours as needed for mild pain.    Yes [provider]  albuterol  (VENTOLIN HFA) 108 (90 Base) MCG/ACT inhaler Inhale 2 puffs into the lungs every 6 (six) hours as needed for wheezing or shortness of breath.  07/30/18 03/25/20 Yes [provider]  B-D ULTRAFINE III SHORT PEN 31G X 8 MM MISC 1 each by Other route daily. For lantus 10/24/17  Yes [provider]  Calcium Carb-Cholecalciferol (CALCIUM 600+D) 600-800 MG-UNIT TABS Take 2 tablets by mouth at bedtime.   Yes [provider]  carboxymethylcellulose (REFRESH PLUS) 0.5 % SOLN Place 1 drop into both eyes 3 (three) times daily as needed (dry/irritated eyes).   Yes [provider]  carvedilol (COREG) 3.125 MG tablet Take 3.125 mg by mouth 2 (two) times daily with a meal.   Yes [provider]  citalopram (CELEXA) 10 MG tablet Take 30 mg by mouth at bedtime.    Yes [provider]  diclofenac Sodium (VOLTAREN) 1 % GEL Apply 2 g topically as needed for pain. 10/30/19 10/29/20 Yes [provider]  ELIQUIS 5 MG TABS tablet TAKE 1 TABLET BY MOUTH TWICE A DAY Patient taking differently: Take 5 mg by mouth 2 (two) times daily.  03/10/20  Yes Waynetta Sandy, MD  ferrous sulfate 325 (65 FE) MG tablet Take 325 mg by mouth daily with breakfast.   Yes [provider]  fluticasone (FLONASE) 50 MCG/ACT nasal spray Place 1-2 sprays into both nostrils daily as needed (allergies.).  10/01/18  Yes [provider]  furosemide (LASIX) 20 MG tablet Take 20 mg by mouth 2 (two) times daily.    Yes [provider]  gabapentin (NEURONTIN) 300 MG capsule Take 300 mg by mouth 3 (three) times daily.  06/02/19  Yes [provider]  Insulin Glargine (BASAGLAR KWIKPEN) 100 UNIT/ML SOPN Inject 40 Units into the skin at bedtime. 10/11/17  Yes [provider]  lidocaine (LIDODERM) 5 % Place 1 patch onto the skin at bedtime. Apply to affected area for 12 hours only each day (then remove patch) 05/11/19  Yes [provider]  Magnesium  100 MG TABS Take 100 mg by mouth at bedtime. Magnesium Citrate   Yes [provider]  Melatonin 5 MG CAPS Take 15 mg by mouth at bedtime.  04/29/18  Yes [provider]  omeprazole (PRILOSEC) 20 MG capsule Take 20 mg by mouth at bedtime.   Yes [provider]  polyethylene glycol powder (GLYCOLAX/MIRALAX) 17 GM/SCOOP powder Take 17 g by mouth daily as needed (constipation.).    Yes [provider]  promethazine (PHENERGAN) 25 MG tablet Take 25 mg by mouth as needed for nausea/vomiting. 03/11/20  Yes [provider]  spironolactone (ALDACTONE) 50 MG tablet Take 50 mg by mouth daily.  09/03/18  Yes [provider]  tiZANidine (ZANAFLEX) 4 MG tablet Take 1 tablet (4 mg total) by mouth at bedtime. 11/25/19  Yes Jaynee Eagles, PA-C  diphenhydrAMINE (BENADRYL) 50 MG tablet Take 1 tablet by mouth at 1pm prior to ct scan 06/01/19 06/11/19  Irene Shipper, MD     Family History  Problem Relation Age of Onset  . Kidney failure Mother   . Diabetes Mother   . Obesity Mother   . Hypertension Mother   . Cirrhosis Father   . Hypertension Sister   . Kidney failure Brother   .  Hypertension Brother   . Hypertension Sister   . Alcoholism Brother   . Other Brother        MRSA  . Cirrhosis Daughter   . Drug abuse Daughter     Social History   Socioeconomic History  . Marital status: Divorced    Spouse name: Not on file  . Number of children: 2  . Years of education: Not on file  . Highest education level: Not on file  Occupational History  . Occupation: retired  Tobacco Use  . Smoking status: Current Every Day Smoker    Packs/day: 0.50    Types: Cigarettes  . Smokeless tobacco: Never Used  Vaping Use  . Vaping Use: Some days  . Substances: Nicotine, Flavoring  Substance and Sexual Activity  . Alcohol use: No  . Drug use: Not Currently  . Sexual activity: Not on file  Other Topics Concern  . Not on file  Social History Narrative  . Not on  file   Social Determinants of Health   Financial Resource Strain:   . Difficulty of Paying Living Expenses: Not on file  Food Insecurity:   . Worried About Charity fundraiser in the Last Year: Not on file  . Ran Out of Food in the Last Year: Not on file  Transportation Needs:   . Lack of Transportation (Medical): Not on file  . Lack of Transportation (Non-Medical): Not on file  Physical Activity:   . Days of Exercise per Week: Not on file  . Minutes of Exercise per Session: Not on file  Stress:   . Feeling of Stress : Not on file  Social Connections:   . Frequency of Communication with Friends and Family: Not on file  . Frequency of Social Gatherings with Friends and Family: Not on file  . Attends Religious Services: Not on file  . Active Member of Clubs or Organizations: Not on file  . Attends Archivist Meetings: Not on file  . Marital Status: Not on file    Review of Systems: A 12 point ROS discussed and pertinent positives are indicated in the HPI above.  All other systems are negative.  Review of Systems  Constitutional: Positive for fatigue. Negative for appetite change.  Respiratory: Negative for cough and shortness of breath.   Cardiovascular: Negative for chest pain and leg swelling.  Gastrointestinal: Negative for abdominal pain, diarrhea, nausea and vomiting.  Musculoskeletal: Positive for gait problem.       Left hip pain 10/10. Unable to ambulate secondary to pain.   Neurological: Negative for light-headedness and headaches.    Vital Signs: BP (!) 147/73 (BP Location: Left Arm)   Pulse 79   Temp 97.7 F (36.5 C) (Oral)   Resp 18   Ht 5\' 2"  (1.575 m)   Wt 246 lb 11.1 oz (111.9 kg)   SpO2 98%   BMI 45.12 kg/m   Physical Exam Constitutional:      General: She is not in acute distress.    Appearance: She is ill-appearing.  HENT:     Mouth/Throat:     Mouth: Mucous membranes are dry.     Pharynx: Oropharynx is clear.  Cardiovascular:      Rate and Rhythm: Normal rate and regular rhythm.     Pulses: Normal pulses.     Heart sounds: Normal heart sounds.  Pulmonary:     Effort: Pulmonary effort is normal.     Breath sounds: Normal breath sounds.  Abdominal:  General: Bowel sounds are normal.     Palpations: Abdomen is soft.  Musculoskeletal:        General: Tenderness present.     Left hip: Tenderness present. Decreased range of motion.  Skin:    General: Skin is warm and dry.  Neurological:     Mental Status: She is alert and oriented to person, place, and time.     Motor: Tremor present.     Imaging: MR THORACIC SPINE WO CONTRAST  Result Date: 03/25/2020 CLINICAL DATA:  Initial evaluation for low back pain, cauda equina syndrome suspected. EXAM: MRI THORACIC AND LUMBAR SPINE WITHOUT CONTRAST TECHNIQUE: Multiplanar and multiecho pulse sequences of the thoracic and lumbar spine were obtained without intravenous contrast. COMPARISON:  Prior CT from 06/05/2019 FINDINGS: MRI THORACIC SPINE FINDINGS Alignment:  Examination degraded by motion artifact. Vertebral bodies normally aligned with preservation of the normal thoracic kyphosis. No listhesis or static subluxation. Vertebrae: Vertebral body height maintained without acute or chronic fracture. Bone marrow signal intensity mildly heterogeneous without worrisome osseous lesion. No evidence for metastatic disease within the thoracic spine. Subcentimeter benign hemangioma noted within the T10 vertebral body. Cord:  Normal signal and morphology on this motion degraded exam. Paraspinal and other soft tissues: Paraspinous soft tissues demonstrate no acute finding. Probable scattered atelectatic changes noted within the partially visualized lungs. Visualized visceral structures otherwise grossly unremarkable. Disc levels: Normal expected for age multilevel disc desiccation with mild annular disc bulging, most notable at T4-5, T10-11, and T11-12. No significant spinal stenosis or  evidence for cord deformity or impingement. Foramina remain patent. No neural impingement. MRI LUMBAR SPINE FINDINGS Segmentation:  Examination moderately degraded by motion artifact. Standard segmentation. Lowest well-formed disc space labeled the L5-S1 level. Alignment: Levoscoliosis. Trace 2 mm retrolisthesis of L3 on L4, with trace 2 mm anterolisthesis of L4 on L5, chronic and facet mediated. Alignment otherwise normal with preservation of the normal lumbar lordosis. Vertebrae: There is an abnormal T2/STIR hyperintense lesion involving the central and left aspect of the sacrum, concerning for osseous metastatic disease (series 2, image 9). This appears to be new as compared to most recent available CT from 06/05/2019. Probable early invasion of the adjacent left S1 and possibly S2 neural foramina, partially visualized (series 5, image 26). Possible early invasion of the right S1 neural foramen noted as well. Probable extraosseous extension into the adjacent presacral space noted on sagittal view (series 2, image 13). No visible pathologic fracture. No other worrisome osseous lesions or evidence for metastatic disease seen within the lumbar spine. Underlying bone marrow signal intensity somewhat diffusely heterogeneous. Vertebral body height maintained without acute or chronic fracture. No other abnormal marrow edema. Conus medullaris and cauda equina: Conus extends to the L1-2 level. Conus and cauda equina appear normal. Paraspinal and other soft tissues: Paraspinous soft tissues demonstrate no acute finding on this motion degraded exam. There is question of a heterogeneous lesion within the interpolar right kidney measuring 2.0 x 1.8 cm (series 4, image 5), not well evaluated on this motion degraded and noncontrast examination. No lesion seen at this location on prior CT. Remainder of the visualized visceral structures otherwise unremarkable. Disc levels: L1-2: Diffuse disc bulge with disc desiccation and  intervertebral disc space narrowing. Superimposed central to left subarticular disc protrusion indents the ventral thecal sac (series 4, image 7). Resultant mild to moderate spinal stenosis without frank cord impingement. Foramina remain patent. L2-3: Mild diffuse disc bulge with disc desiccation. Disc bulge eccentric to the right. Mild bilateral  facet hypertrophy. Resultant mild canal with right subarticular stenosis. Foramina remain patent. L3-4: Mild diffuse disc bulge with disc desiccation. Disc bulge eccentric to the left. Moderate bilateral facet hypertrophy. Resultant moderate canal with left lateral recess stenosis. Mild left L3 foraminal narrowing. Right neural foramina remains patent. L4-5: Trace anterolisthesis. Diffuse disc bulge with disc desiccation. Disc bulge asymmetric to the left. Superimposed small central to left foraminal disc extrusion with superior migration (series 18, image 8). Moderate bilateral facet and ligament flavum hypertrophy. Associated small joint effusion on the left. Resultant severe canal with bilateral subarticular stenosis. Thecal sac measures 5 mm in AP diameter at its most narrow point. Moderate left L4 foraminal stenosis. No significant right foraminal narrowing. L5-S1: Degenerative intervertebral disc space narrowing with diffuse disc bulge and disc desiccation. Reactive endplate change with marginal endplate osteophytic spurring. Broad posterior disc osteophyte contacts the descending S1 nerve roots as they course through the lateral recesses bilaterally (series 4, image 23). Mild facet hypertrophy. Resultant mild to moderate bilateral lateral recess narrowing. Central canal remains patent. Mild right worse than left L5 foraminal stenosis. IMPRESSION: MR THORACIC SPINE IMPRESSION: 1. Motion degraded exam. 2. No acute abnormality within the thoracic spine. No evidence for significant disc pathology, stenosis, or evidence for neural impingement. No metastatic disease. MR  LUMBAR SPINE IMPRESSION: 1. Abnormal T2/STIR hyperintense lesion involving the central and left aspect of the sacrum, concerning for osseous metastatic disease. Probable early invasion of the adjacent left S1 and S2 neural foramina, with extraosseous extension into the adjacent presacral space. No visible pathologic fracture. Finding could be further assessed with dedicated MRI of the sacrum/pelvis as clinically warranted. 2. No other metastatic disease within the lumbar spine. 3. 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 4. Left eccentric disc bulge and facet hypertrophy at L3-4 with resultant mild to moderate canal and left lateral recess stenosis. 5. Central to left subarticular disc protrusion at L1-2 with resultant mild to moderate spinal stenosis. Electronically Signed   By: Jeannine Boga M.D.   On: 03/25/2020 01:13   MR LUMBAR SPINE WO CONTRAST  Result Date: 03/25/2020 CLINICAL DATA:  Initial evaluation for low back pain, cauda equina syndrome suspected. EXAM: MRI THORACIC AND LUMBAR SPINE WITHOUT CONTRAST TECHNIQUE: Multiplanar and multiecho pulse sequences of the thoracic and lumbar spine were obtained without intravenous contrast. COMPARISON:  Prior CT from 06/05/2019 FINDINGS: MRI THORACIC SPINE FINDINGS Alignment:  Examination degraded by motion artifact. Vertebral bodies normally aligned with preservation of the normal thoracic kyphosis. No listhesis or static subluxation. Vertebrae: Vertebral body height maintained without acute or chronic fracture. Bone marrow signal intensity mildly heterogeneous without worrisome osseous lesion. No evidence for metastatic disease within the thoracic spine. Subcentimeter benign hemangioma noted within the T10 vertebral body. Cord:  Normal signal and morphology on this motion degraded exam. Paraspinal and other soft tissues: Paraspinous soft  tissues demonstrate no acute finding. Probable scattered atelectatic changes noted within the partially visualized lungs. Visualized visceral structures otherwise grossly unremarkable. Disc levels: Normal expected for age multilevel disc desiccation with mild annular disc bulging, most notable at T4-5, T10-11, and T11-12. No significant spinal stenosis or evidence for cord deformity or impingement. Foramina remain patent. No neural impingement. MRI LUMBAR SPINE FINDINGS Segmentation:  Examination moderately degraded by motion artifact. Standard segmentation. Lowest well-formed disc space labeled the L5-S1 level. Alignment: Levoscoliosis. Trace 2 mm retrolisthesis of L3 on L4, with trace 2 mm anterolisthesis of L4  on L5, chronic and facet mediated. Alignment otherwise normal with preservation of the normal lumbar lordosis. Vertebrae: There is an abnormal T2/STIR hyperintense lesion involving the central and left aspect of the sacrum, concerning for osseous metastatic disease (series 2, image 9). This appears to be new as compared to most recent available CT from 06/05/2019. Probable early invasion of the adjacent left S1 and possibly S2 neural foramina, partially visualized (series 5, image 26). Possible early invasion of the right S1 neural foramen noted as well. Probable extraosseous extension into the adjacent presacral space noted on sagittal view (series 2, image 13). No visible pathologic fracture. No other worrisome osseous lesions or evidence for metastatic disease seen within the lumbar spine. Underlying bone marrow signal intensity somewhat diffusely heterogeneous. Vertebral body height maintained without acute or chronic fracture. No other abnormal marrow edema. Conus medullaris and cauda equina: Conus extends to the L1-2 level. Conus and cauda equina appear normal. Paraspinal and other soft tissues: Paraspinous soft tissues demonstrate no acute finding on this motion degraded exam. There is question of a  heterogeneous lesion within the interpolar right kidney measuring 2.0 x 1.8 cm (series 4, image 5), not well evaluated on this motion degraded and noncontrast examination. No lesion seen at this location on prior CT. Remainder of the visualized visceral structures otherwise unremarkable. Disc levels: L1-2: Diffuse disc bulge with disc desiccation and intervertebral disc space narrowing. Superimposed central to left subarticular disc protrusion indents the ventral thecal sac (series 4, image 7). Resultant mild to moderate spinal stenosis without frank cord impingement. Foramina remain patent. L2-3: Mild diffuse disc bulge with disc desiccation. Disc bulge eccentric to the right. Mild bilateral facet hypertrophy. Resultant mild canal with right subarticular stenosis. Foramina remain patent. L3-4: Mild diffuse disc bulge with disc desiccation. Disc bulge eccentric to the left. Moderate bilateral facet hypertrophy. Resultant moderate canal with left lateral recess stenosis. Mild left L3 foraminal narrowing. Right neural foramina remains patent. L4-5: Trace anterolisthesis. Diffuse disc bulge with disc desiccation. Disc bulge asymmetric to the left. Superimposed small central to left foraminal disc extrusion with superior migration (series 18, image 8). Moderate bilateral facet and ligament flavum hypertrophy. Associated small joint effusion on the left. Resultant severe canal with bilateral subarticular stenosis. Thecal sac measures 5 mm in AP diameter at its most narrow point. Moderate left L4 foraminal stenosis. No significant right foraminal narrowing. L5-S1: Degenerative intervertebral disc space narrowing with diffuse disc bulge and disc desiccation. Reactive endplate change with marginal endplate osteophytic spurring. Broad posterior disc osteophyte contacts the descending S1 nerve roots as they course through the lateral recesses bilaterally (series 4, image 23). Mild facet hypertrophy. Resultant mild to moderate  bilateral lateral recess narrowing. Central canal remains patent. Mild right worse than left L5 foraminal stenosis. IMPRESSION: MR THORACIC SPINE IMPRESSION: 1. Motion degraded exam. 2. No acute abnormality within the thoracic spine. No evidence for significant disc pathology, stenosis, or evidence for neural impingement. No metastatic disease. MR LUMBAR SPINE IMPRESSION: 1. Abnormal T2/STIR hyperintense lesion involving the central and left aspect of the sacrum, concerning for osseous metastatic disease. Probable early invasion of the adjacent left S1 and S2 neural foramina, with extraosseous extension into the adjacent presacral space. No visible pathologic fracture. Finding could be further assessed with dedicated MRI of the sacrum/pelvis as clinically warranted. 2. No other metastatic disease within the lumbar spine. 3. 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 4. Left eccentric  disc bulge and facet hypertrophy at L3-4 with resultant mild to moderate canal and left lateral recess stenosis. 5. Central to left subarticular disc protrusion at L1-2 with resultant mild to moderate spinal stenosis. Electronically Signed   By: Jeannine Boga M.D.   On: 03/25/2020 01:13   CT CHEST ABDOMEN PELVIS W CONTRAST  Result Date: 03/25/2020 CLINICAL DATA:  Left sacral lesion on MR. History of sigmoid colon cancer. EXAM: CT CHEST, ABDOMEN, AND PELVIS WITH CONTRAST TECHNIQUE: Multidetector CT imaging of the chest, abdomen and pelvis was performed following the standard protocol during bolus administration of intravenous contrast. CONTRAST:  170mL OMNIPAQUE IOHEXOL 300 MG/ML  SOLN COMPARISON:  CT chest abdomen pelvis dated 06/05/2019. MRI thoracolumbar spine dated 03/25/2020. FINDINGS: CT CHEST FINDINGS Cardiovascular: Heart is normal in size.  No pericardial effusion. No evidence of thoracic aortic aneurysm.  Mediastinum/Nodes: No suspicious mediastinal lymphadenopathy. Visualized thyroid is enlarged/heterogeneous. Lungs/Pleura: Trace right pleural effusion. No suspicious pulmonary nodules. Mild scarring/fibrosis in the right upper lobe. No pleural effusion or pneumothorax. Musculoskeletal: No focal osseous lesions. CT ABDOMEN PELVIS FINDINGS Hepatobiliary: Cirrhosis.  No suspicious/enhancing hepatic lesions. Numerous layering gallstones (series 4/image 64), without associated inflammatory changes. Pancreas: Within normal limits. Spleen: Enlarged, measuring 15.3 cm in maximal craniocaudal dimension. Adrenals/Urinary Tract: Adrenal glands are within normal limits. 2.6 cm hypoenhancing mass in the right upper kidney (series 4/image 65), essentially new. This appearance may reflect infection, but if neoplastic, would favor metastasis over renal cell carcinoma. Left kidney is within normal limits.  No hydronephrosis. Bladder is within normal limits. Stomach/Bowel: Stomach is within normal limits. No evidence of bowel obstruction. Appendix is not discretely visualized. Status post left hemicolectomy with suture line in the lower abdomen (series 4/image 95). Vascular/Lymphatic: No evidence of abdominal aortic aneurysm. Atherosclerotic calcifications of the abdominal aorta and branch vessels. No suspicious abdominopelvic lymphadenopathy. Reproductive: Uterus is within normal limits. Left ovary is within normal limits. Right ovary is notable for a 3.2 cm cyst/follicle, previously 3.5 cm. Other: No abdominopelvic ascites. Musculoskeletal: Destructive left sacral lesion (series 4/image 100), better evaluated on recent MRI. Associated 2.8 x 3.5 cm soft tissue component anterior to the left sacrum (series 4/image 105), previously 2.1 x 2.6 cm. IMPRESSION: Status post left hemicolectomy. Destructive left sacral lesion, better evaluated on recent MRI. Associated 2.8 x 3.5 cm soft tissue component anterior to the left sacrum, mildly  increased. This appearance remains worrisome for metastasis. 2.6 cm hypoenhancing mass in the right upper kidney, essentially new. This appearance may reflect infection, but if neoplastic, would favor metastasis over renal cell carcinoma. Trace right pleural effusion. Cirrhosis. Splenomegaly. No abdominopelvic ascites. Cholelithiasis, without associated inflammatory changes. Electronically Signed   By: Julian Hy M.D.   On: 03/25/2020 13:35   DG Knee Complete 4 Views Left  Result Date: 03/24/2020 CLINICAL DATA:  Pain EXAM: LEFT KNEE - COMPLETE 4+ VIEW COMPARISON:  None. FINDINGS: There are moderate tricompartmental degenerative changes, greatest within the medial compartment. There is no significant joint effusion. No acute displaced fracture or dislocation. IMPRESSION: Moderate tricompartmental degenerative changes, greatest within the medial compartment. Electronically Signed   By: Constance Holster M.D.   On: 03/24/2020 23:35   DG Hip Unilat W or Wo Pelvis 2-3 Views Left  Result Date: 03/24/2020 CLINICAL DATA:  Pain status post fall EXAM: DG HIP (WITH OR WITHOUT PELVIS) 2-3V LEFT COMPARISON:  None. FINDINGS: There is mild bilateral hip osteoarthritis. There is no acute displaced fracture or dislocation. There are calcifications projecting over the patient's pelvis  that are favored to represent phleboliths. IMPRESSION: Mild bilateral hip osteoarthritis. No acute displaced fracture or dislocation. Electronically Signed   By: Constance Holster M.D.   On: 03/24/2020 23:32    Labs:  CBC: Recent Labs    03/25/20 0508 03/26/20 0925 03/27/20 0244 03/28/20 0437  WBC 8.3 12.3* 17.8* 9.6  HGB 13.4 13.8 13.6 13.4  HCT 44.5 45.8 44.6 43.2  PLT 170 215 227 214    COAGS: Recent Labs    08/06/19 1107 03/26/20 0925 03/26/20 1857 03/27/20 0244 03/27/20 1530 03/28/20 0437  INR 1.1  --   --   --   --   --   APTT 29   < > 52* 51* 189* 103*   < > = values in this interval not displayed.     BMP: Recent Labs    04/30/19 1200 05/29/19 1525 08/06/19 1107 08/06/19 1107 08/15/19 0504 08/16/19 0518 03/24/20 2200 03/25/20 0508  NA 139   < > 138   < > 137 139 140 140  K 3.9   < > 4.1   < > 4.1 3.9 4.1 3.9  CL 103   < > 104   < > 106 106 106 104  CO2 26   < > 28   < > 27 27 22 23   GLUCOSE 164*   < > 108*   < > 137* 100* 171* 127*  BUN 19   < > 21   < > 16 18 27* 22  CALCIUM 8.9   < > 8.7*   < > 8.5* 8.8* 9.2 9.2  CREATININE 1.43*   < > 1.06*   < > 0.75 0.88 1.01* 0.96  GFRNONAA 38*  --  54*   < > >60 >60 >60 >60  GFRAA 44*  --  >60  --  >60 >60  --   --    < > = values in this interval not displayed.    LIVER FUNCTION TESTS: Recent Labs    05/29/19 1525 08/06/19 1107 03/24/20 2200 03/25/20 0508  BILITOT 0.7 0.9 0.8 1.3*  AST 18 19 23 19   ALT 10 12 14 14   ALKPHOS 147* 137* 130* 123  PROT 7.3 6.4* 6.9 7.0  ALBUMIN 3.6 3.0* 2.9* 2.8*    TUMOR MARKERS: Recent Labs    05/29/19 1525  CEA 5.0*    Assessment and Plan:  Left sacral lesion; history of colon cancer: Tonya Dougherty, 69 year old female, is tentatively scheduled today for an image-guided biopsy of a left sacral lesion.  Risks and benefits of this procedure were discussed with the patient and/or patient's family including, but not limited to bleeding, infection, damage to adjacent structures or low yield requiring additional tests.  All of the questions were answered and there is agreement to proceed.  The patient has been NPO. Labs and vitals have been reviewed. Last dose of Eliquis was 03/25/20 at 1326. She has been on a heparin drip and this was discontinued 03/28/20 at 0930.  Consent signed and in chart.  Thank you for this interesting consult.  I greatly enjoyed meeting Tonya Dougherty and look forward to participating in their care.  A copy of this report was sent to the requesting provider on this date.  Electronically Signed: Soyla Dryer, AGACNP-BC [phone] 03/28/2020, 9:17  AM   I spent a total of 20 Minutes    in face to face in clinical consultation, greater than 50% of which was counseling/coordinating care for left sacral mass biopsy.

## 2020-03-28 NOTE — Procedures (Signed)
Interventional Radiology Procedure Note  Procedure: CT SACRAL MASS BX    Complications: None  Estimated Blood Loss:  MIN  Findings: 18 G CORES ATTEMPTED, SAMPLING FRAGMENTED    Tamera Punt, MD

## 2020-03-28 NOTE — Progress Notes (Signed)
[facility] for Heparin Indication:  History of VTE  Allergies  Allergen Reactions  . Xarelto [Rivaroxaban] Rash    Severe rash with itching  . Erythromycin Other (See Comments)    Oral Thrush  . Lipitor [Atorvastatin] Other (See Comments)    Leg cramps  . Pravachol [Pravastatin] Other (See Comments)    Leg muscle cramps  . Ivp Dye [Iodinated Diagnostic Agents] Rash    Patient Measurements: Height: 5\' 2"  (157.5 cm) Weight: 111.9 kg (246 lb 11.1 oz) IBW/kg (Calculated) : 50.1 Heparin Dosing Weight: 77.4 kg  Vital Signs: Temp: 98.2 F (36.8 C) (11/29 0543) Temp Source: Oral (11/29 0543) BP: 137/67 (11/29 0543) Pulse Rate: 74 (11/29 0543)  Labs: Recent Labs    03/26/20 0925 03/26/20 1857 03/27/20 0244 03/27/20 1530 03/28/20 0437  HGB 13.8  --  13.6  --  13.4  HCT 45.8  --  44.6  --  43.2  PLT 215  --  227  --  214  APTT 46*   < > 51* 189* 103*  HEPARINUNFRC 0.93*   < > 0.41 0.74* 0.60   < > = values in this interval not displayed.    Estimated Creatinine Clearance: 65.3 mL/min (by C-G formula based on SCr of 0.96 mg/dL).  Assessment: 69 yr old female on Eliquis 5 mg BID PTA for hx DVT (2019) to continue on IV heparin while Eliquis is on hold.  Hx adenocarcinoma of sigmoid colon s/p resection in 07/2019.  MRI revealed sacral lesion, planning biopsy when off Eliquis for 48 hrs, tentatively 11/29.  Last Eliquis dose 11/26 at ~1:30pm.     Using aPTTs for monitoring, as heparin levels are expected to be falsely elevated due to recent Eliquis doses.  APTT is slightly supra-therapeutic; no bleeding reported.  Goal of Therapy:  Heparin level 0.3-0.7 units/ml aPTT 66-102 seconds Monitor platelets by anticoagulation protocol: Yes   Plan:  Decrease heparin infusion to 1600 units/hr Daily heparin level, aPTT and CBC F/U with [facility] plan post biopsy  Vanetta Rule D. Mina Marble, PharmD, BCPS, [place] 03/28/2020, 7:17 AM

## 2020-03-28 NOTE — Progress Notes (Addendum)
1615 Received pt from IR via bed. A&O x4. Denies pain at this time. V/s checked. Bandaid to back dry and intact. 1700 Bandaid to lower back dry and intact.

## 2020-03-28 NOTE — Progress Notes (Signed)
PT Cancellation Note  Patient Details Name: Tonya Dougherty MRN: [id number] DOB: 27-Dec-1950   Cancelled Treatment:    Reason Eval/Treat Not Completed: (P) Patient at procedure or test/unavailable (pt at IR for biopsy.) Will continue efforts per PT POC as schedule permits.   Kara Pacer Makell Cyr 03/28/2020, 5:15 PM

## 2020-03-28 NOTE — Progress Notes (Signed)
PROGRESS NOTE  Tonya Dougherty AST:[id number] DOB: Oct 11, 1950   PCP: Gay Filler, MD  Patient is from: Home. Patient to be changing room 6 complaint today no complaints acute DOA: 03/24/2020 LOS: 2  Chief complaints: Weakness, fatigue and left leg pain  Brief Narrative / Interim history: 69 year old female with history of adenocarcinoma of sigmoid colon status post resection in 12/8919, nonalcoholic fatty liver disease, DM-2 and HTN presenting with progressive LLE pain radiating from her left buttock down to her foot over the last 3 months that has acutely gotten worse over the last week.   MRI thoracolumbar spine reveals sacral lesion on the left with probable early invasion of S1 and S2 neural foramina.  MRI also revealed degenerative changes at L4-5 with severe canal and bilateral subarticular stenosis with moderate L4 foraminal narrowing, left eccentric disc bulge and facet hypertrophy at L3-4 with resultant mild to moderate canal and left lateral recess stenosis and central to left subarticular disc protrusion at L1-2 with resultant mild to moderate spinal stenosis.   CT chest/abdomen/pelvis showed destructive left sacral lesion with associated 2.8 x 3.5 cm soft tissue component anterior to the left sacrum worrisome for metastasis, and 2.6 cm hypoenhancing mass in the right upper kidney.   Oncology, Dr. Alen Blew suggested IR biopsy of the sacral lesion but not impressed with the renal lesion.  Neurosurgery recommended outpatient follow-up. IR to perform image guided biopsy of sacral lesion on 11/29.   Therapy recommended SNF.   Subjective: Seen and examined earlier this morning.  Reports significant pain in her left leg all the way from the left hip down to her foot posteriorly.  She describes the pain as severe.  Denies numbness or tingling.  No changes to her leg strengths.  She has not had bowel movement since admission.  Still with urinary incontinence especially when she stands up.   Denies chest pain or dyspnea.  Objective: Vitals:   03/27/20 1130 03/27/20 2225 03/28/20 0543 03/28/20 0842  BP: 118/62 104/60 137/67 (!) 147/73  Pulse: 91 82 74 79  Resp: 15 17 17 18   Temp: 98 F (36.7 C) 98.6 F (37 C) 98.2 F (36.8 C) 97.7 F (36.5 C)  TempSrc: Oral Oral Oral Oral  SpO2: 96% 95% 93% 98%  Weight:      Height:        Intake/Output Summary (Last 24 hours) at 03/28/2020 1137 Last data filed at 03/28/2020 0500 Gross per 24 hour  Intake 137.56 ml  Output 500 ml  Net -362.44 ml   Filed Weights   03/24/20 2140  Weight: 111.9 kg    Examination:  GENERAL: Appears to be in pain.  Nontoxic. HEENT: MMM.  Vision and hearing grossly intact.  NECK: Supple.  No apparent JVD.  RESP: On RA.  No IWOB.  Fair aeration bilaterally. CVS:  RRR. Heart sounds normal.  ABD/GI/GU: BS+. Abd soft, NTND.  MSK/EXT:  Moves extremities. No apparent deformity. No edema.  SKIN: Intertrigo.  Chronic venous insufficiency with erythema mainly in LLE.  No increased warmth to touch. NEURO: Awake, alert and oriented.  Motor 4+/5 in BLE.  Sensory, patellar and Achilles reflexes intact. PSYCH: Calm. Normal affect.  Procedures:  None  Microbiology summarized: [id number] and influenza PCR nonreactive. Urine culture with 1000 E. coli  Assessment & Plan: Ambulatory dysfunction/LLE pain and weakness-seems radiculopathic.  MRI and CT findings as above.  Now in a lot of pain. -Neurosurgery PA, Kimberley Meyran recommended outpatient follow-up. -Pain control with as needed Tylenol  and Percocet.  Add IV Dilaudid for severe pain -Continue increased dose of gabapentin at 600 mg 3 times daily -PT/OT-recommended SNF.  Sacral lesion concerning for neoplasm: MRI of lumbar spine revealed sacral lesions with invasion of S1 and S2.  No primary source on CT chest/abdomen/pelvis.  Reports urinary and fecal incontinence but this has been going on since she had colectomy.  No bowel movement  here. -Discussed with oncology, Dr. Alen Blew who recommended IR biopsy -Plan for image guided biopsy by IR on 11/29.  Adenocarcinoma of sigmoid colon s/p sigmoidectomy in April 2021.  Pathology with clean margins.  -Needs outpatient follow-up with GI  Acute cystitis with hematuria?  UA concerning but no acute UTI symptoms other than chronic urinary incontinence.  Urine culture with pansensitive E. coli. -Ceftriaxone 11/26> 11/28.>>  Ancef through 11/30.   Essential hypertension: Normotensive -Continue home medications  Controlled DM-2 with hyperglycemia: A1c 6.4%. Recent Labs  Lab 03/27/20 0716 03/27/20 1131 03/27/20 1644 03/27/20 2121 03/28/20 0638  GLUCAP 109* 136* 151* 129* 103*  -Continue current insulin regimen  GERD without esophagitis -Continue PPI  Tobacco use disorder: -Cessation counseling and nicotine patch   History of DVT (deep vein thrombosis): On Eliquis. -Now on IV heparin pending IR biopsy -Resume Eliquis after biopsy.  Candidal intertrigo -Received Diflucan x2. -Continue nystatin powder and aeration  Pruritus -Continue Benadryl as needed  Anxiety and depression: Stable. -Continue home medications  Leukocytosis: Likely demargination from recent steroid for IV contrast.  Resolved.  Morbid obesity Body mass index is 45.12 kg/m. Nutrition Problem: Increased nutrient needs Etiology: cancer and cancer related treatments-Encourage lifestyle change to lose weight. Signs/Symptoms: estimated needs-Could benefit from GLP-1 inhibitors given diabetes. Interventions: Ensure Enlive (each supplement provides 350kcal and 20 grams of protein), MVI   Stage II sacral pressure injury: POA Pressure Injury 03/25/20 Sacrum Right;Left Stage 2 -  Partial thickness loss of dermis presenting as a shallow open injury with a red, pink wound bed without slough. (Active)  03/25/20 0430  Location: Sacrum  Location Orientation: Right;Left  Staging: Stage 2 -  Partial  thickness loss of dermis presenting as a shallow open injury with a red, pink wound bed without slough.  Wound Description (Comments):   Present on Admission: Yes   DVT prophylaxis:    Code Status: Full code Family Communication: Patient and/or RN. Available if any question.  Status is: Inpatient  Remains inpatient appropriate because:Ongoing diagnostic testing needed not appropriate for outpatient work up, Unsafe d/c plan, IV treatments appropriate due to intensity of illness or inability to take PO and Inpatient level of care appropriate due to severity of illness   Dispo: The patient is from: Home              Anticipated d/c is to: SNF              Anticipated d/c date is: 2 days              Patient currently is not medically stable to d/c.               Consultants:  Neurosurgery over the phone Oncology over the phone Interventional radiology   Sch Meds:  Scheduled Meds: . carvedilol  3.125 mg Oral BID WC  . citalopram  30 mg Oral QHS  . feeding supplement  237 mL Oral BID BM  . ferrous sulfate  325 mg Oral Q breakfast  . furosemide  20 mg Oral BID  . gabapentin  600 mg Oral  TID  . insulin aspart  0-15 Units Subcutaneous TID AC & HS  . insulin glargine  40 Units Subcutaneous Q2200  . multivitamin with minerals  1 tablet Oral Daily  . nystatin   Topical BID  . pantoprazole  40 mg Oral Daily  . spironolactone  50 mg Oral Daily  . Zinc Oxide   Topical BID   Continuous Infusions: .  ceFAZolin (ANCEF) IV 1 g (03/28/20 0612)  . heparin Stopped (03/28/20 0940)   PRN Meds:.acetaminophen **OR** acetaminophen, diphenhydrAMINE, fluticasone, HYDROmorphone (DILAUDID) injection, ondansetron **OR** ondansetron (ZOFRAN) IV, oxyCODONE-acetaminophen, polyethylene glycol, polyvinyl alcohol  Antimicrobials: Anti-infectives (From admission, onward)   Start     Dose/Rate Route Frequency Ordered Stop   03/27/20 2200  ceFAZolin (ANCEF) IVPB 1 g/50 mL premix        1  g 100 mL/hr over 30 Minutes Intravenous Every 8 hours 03/27/20 1211 03/29/20 2359   03/26/20 0000  cefTRIAXone (ROCEPHIN) 1 g in sodium chloride 0.9 % 100 mL IVPB  Status:  Discontinued        1 g 200 mL/hr over 30 Minutes Intravenous Every 24 hours 03/25/20 0333 03/27/20 1156   03/25/20 1000  fluconazole (DIFLUCAN) tablet 150 mg  Status:  Discontinued        150 mg Oral Daily 03/25/20 0556 03/27/20 0715   03/25/20 0045  cefTRIAXone (ROCEPHIN) 1 g in sodium chloride 0.9 % 100 mL IVPB        1 g 200 mL/hr over 30 Minutes Intravenous  Once 03/25/20 0037 03/25/20 0158       I have personally reviewed the following labs and images: CBC: Recent Labs  Lab 03/24/20 2200 03/25/20 0508 03/26/20 0925 03/27/20 0244 03/28/20 0437  WBC 8.4 8.3 12.3* 17.8* 9.6  NEUTROABS 5.6 6.1  --   --   --   HGB 13.0 13.4 13.8 13.6 13.4  HCT 42.4 44.5 45.8 44.6 43.2  MCV 94.6 94.7 95.4 94.5 93.7  PLT 183 170 215 227 214   BMP &GFR Recent Labs  Lab 03/24/20 2200 03/25/20 0508  NA 140 140  K 4.1 3.9  CL 106 104  CO2 22 23  GLUCOSE 171* 127*  BUN 27* 22  CREATININE 1.01* 0.96  CALCIUM 9.2 9.2  MG  --  2.1   Estimated Creatinine Clearance: 65.3 mL/min (by C-G formula based on SCr of 0.96 mg/dL). Liver & Pancreas: Recent Labs  Lab 03/24/20 2200 03/25/20 0508  AST 23 19  ALT 14 14  ALKPHOS 130* 123  BILITOT 0.8 1.3*  PROT 6.9 7.0  ALBUMIN 2.9* 2.8*   No results for input(s): LIPASE, AMYLASE in the last 168 hours. No results for input(s): AMMONIA in the last 168 hours. Diabetic: No results for input(s): HGBA1C in the last 72 hours. Recent Labs  Lab 03/27/20 0716 03/27/20 1131 03/27/20 1644 03/27/20 2121 03/28/20 0638  GLUCAP 109* 136* 151* 129* 103*   Cardiac Enzymes: No results for input(s): CKTOTAL, CKMB, CKMBINDEX, TROPONINI in the last 168 hours. No results for input(s): PROBNP in the last 8760 hours. Coagulation Profile: No results for input(s): INR, PROTIME in the last  168 hours. Thyroid Function Tests: No results for input(s): TSH, T4TOTAL, FREET4, T3FREE, THYROIDAB in the last 72 hours. Lipid Profile: No results for input(s): CHOL, HDL, LDLCALC, TRIG, CHOLHDL, LDLDIRECT in the last 72 hours. Anemia Panel: No results for input(s): VITAMINB12, FOLATE, FERRITIN, TIBC, IRON, RETICCTPCT in the last 72 hours. Urine analysis:    Component Value Date/Time  COLORURINE YELLOW 03/24/2020 2311   APPEARANCEUR CLOUDY (A) 03/24/2020 2311   LABSPEC 1.018 03/24/2020 2311   PHURINE 5.0 03/24/2020 2311   GLUCOSEU NEGATIVE 03/24/2020 2311   HGBUR MODERATE (A) 03/24/2020 2311   BILIRUBINUR NEGATIVE 03/24/2020 2311   [place] 03/24/2020 2311   PROTEINUR 30 (A) 03/24/2020 2311   NITRITE POSITIVE (A) 03/24/2020 2311   LEUKOCYTESUR MODERATE (A) 03/24/2020 2311   Sepsis Labs: Invalid input(s): PROCALCITONIN, [place]  Microbiology: Recent Results (from the past 240 hour(s))  Culture, Urine     Status: Abnormal   Collection Time: 03/24/20 11:11 PM   Specimen: Urine, Clean Catch  Result Value Ref Range Status   Specimen Description URINE, CLEAN CATCH  Final   Special Requests   Final    NONE Performed at [facility], [address] [place], [place] [address]    Culture >=100,000 COLONIES/mL ESCHERICHIA COLI (A)  Final   Report Status 03/27/2020 FINAL  Final   Organism ID, Bacteria ESCHERICHIA COLI (A)  Final      Susceptibility   Escherichia coli - MIC*    AMPICILLIN 4 SENSITIVE Sensitive     CEFAZOLIN <=4 SENSITIVE Sensitive     CEFEPIME <=0.12 SENSITIVE Sensitive     CEFTRIAXONE <=0.25 SENSITIVE Sensitive     CIPROFLOXACIN <=0.25 SENSITIVE Sensitive     GENTAMICIN <=1 SENSITIVE Sensitive     IMIPENEM <=0.25 SENSITIVE Sensitive     NITROFURANTOIN <=16 SENSITIVE Sensitive     TRIMETH/SULFA <=20 SENSITIVE Sensitive     AMPICILLIN/SULBACTAM <=2 SENSITIVE Sensitive     PIP/TAZO <=4 SENSITIVE Sensitive     * >=100,000 COLONIES/mL  ESCHERICHIA COLI  Resp Panel by RT-PCR (Flu A&B, Covid) Nasopharyngeal Swab     Status: None   Collection Time: 03/25/20  2:36 AM   Specimen: Nasopharyngeal Swab; Nasopharyngeal(NP) swabs in vial transport medium  Result Value Ref Range Status   SARS Coronavirus 2 by RT PCR NEGATIVE NEGATIVE Final    Comment: (NOTE) SARS-CoV-2 target nucleic acids are NOT DETECTED.  The SARS-CoV-2 RNA is generally detectable in upper respiratory specimens during the acute phase of infection. The lowest concentration of SARS-CoV-2 viral copies this assay can detect is 138 copies/mL. A negative result does not preclude SARS-Cov-2 infection and should not be used as the sole basis for treatment or other patient management decisions. A negative result may occur with  improper specimen collection/handling, submission of specimen other than nasopharyngeal swab, presence of viral mutation(s) within the areas targeted by this assay, and inadequate number of viral copies(<138 copies/mL). A negative result must be combined with clinical observations, patient history, and epidemiological information. The expected result is Negative.  Fact Sheet for Patients:  EntrepreneurPulse.com.au  Fact Sheet for Healthcare Providers:  IncredibleEmployment.be  This test is no t yet approved or cleared by the [place] FDA and  has been authorized for detection and/or diagnosis of SARS-CoV-2 by FDA under an Emergency Use Authorization (EUA). This EUA will remain  in effect (meaning this test can be used) for the duration of the COVID-19 declaration under Section 564(b)(1) of the Act, 21 U.S.C.section 360bbb-3(b)(1), unless the authorization is terminated  or revoked sooner.       Influenza A by PCR NEGATIVE NEGATIVE Final   Influenza B by PCR NEGATIVE NEGATIVE Final    Comment: (NOTE) The Xpert Xpress SARS-CoV-2/FLU/RSV plus assay is intended as an aid in the diagnosis of  influenza from Nasopharyngeal swab specimens and should not be used as a  sole basis for treatment. Nasal washings and aspirates are unacceptable for Xpert Xpress SARS-CoV-2/FLU/RSV testing.  Fact Sheet for Patients: EntrepreneurPulse.com.au  Fact Sheet for Healthcare Providers: IncredibleEmployment.be  This test is not yet approved or cleared by the [place] FDA and has been authorized for detection and/or diagnosis of SARS-CoV-2 by FDA under an Emergency Use Authorization (EUA). This EUA will remain in effect (meaning this test can be used) for the duration of the COVID-19 declaration under Section 564(b)(1) of the Act, 21 U.S.C. section 360bbb-3(b)(1), unless the authorization is terminated or revoked.  Performed at [facility], [place] [address], [place], [place] [address]     Radiology Studies: No results found.   Keelynn Furgerson T. [place]  If 7PM-7AM, please contact night-coverage [email or web address] 03/28/2020, 11:37 AM

## 2020-03-29 ENCOUNTER — Encounter (HOSPITAL_COMMUNITY): Payer: Self-pay | Admitting: Student

## 2020-03-29 DIAGNOSIS — R262 Difficulty in walking, not elsewhere classified: Secondary | ICD-10-CM | POA: Diagnosis not present

## 2020-03-29 DIAGNOSIS — N390 Urinary tract infection, site not specified: Secondary | ICD-10-CM | POA: Diagnosis not present

## 2020-03-29 DIAGNOSIS — M79604 Pain in right leg: Secondary | ICD-10-CM | POA: Diagnosis not present

## 2020-03-29 DIAGNOSIS — C7951 Secondary malignant neoplasm of bone: Secondary | ICD-10-CM | POA: Diagnosis not present

## 2020-03-29 LAB — CBC
HCT: 40.5 % (ref 36.0–46.0)
Hemoglobin: 12.5 g/dL (ref 12.0–15.0)
MCH: 28.6 pg (ref 26.0–34.0)
MCHC: 30.9 g/dL (ref 30.0–36.0)
MCV: 92.7 fL (ref 80.0–100.0)
Platelets: 214 10*3/uL (ref 150–400)
RBC: 4.37 MIL/uL (ref 3.87–5.11)
RDW: 19.9 % — ABNORMAL HIGH (ref 11.5–15.5)
WBC: 8.4 10*3/uL (ref 4.0–10.5)
nRBC: 0 % (ref 0.0–0.2)

## 2020-03-29 LAB — GLUCOSE, CAPILLARY
Glucose-Capillary: 114 mg/dL — ABNORMAL HIGH (ref 70–99)
Glucose-Capillary: 115 mg/dL — ABNORMAL HIGH (ref 70–99)
Glucose-Capillary: 199 mg/dL — ABNORMAL HIGH (ref 70–99)
Glucose-Capillary: 184 mg/dL — ABNORMAL HIGH (ref 70–99)

## 2020-03-29 MED ORDER — APIXABAN 5 MG PO TABS
5.0000 mg | ORAL_TABLET | Freq: Two times a day (BID) | ORAL | Status: DC
  Administered 2020-03-29 – 2020-04-14 (×33): 5 mg via ORAL
  Filled 2020-03-29 (×12): qty 1
  Filled 2020-03-29: qty 2
  Filled 2020-03-29 (×20): qty 1

## 2020-03-29 MED ORDER — SENNOSIDES-DOCUSATE SODIUM 8.6-50 MG PO TABS: 1.0000 | ORAL_TABLET | Freq: Two times a day (BID) | ORAL | Status: DC | PRN

## 2020-03-29 NOTE — Progress Notes (Signed)
Physical Therapy Treatment Patient Details Name: Tonya Dougherty MRN: [id number] DOB: [birth year] Today's Date: 03/29/2020    History of Present Illness Pt is a 69 y.o. female with a medical history significant for adenocarcinoma of sigmoid colon s/p resection 09/6292, nonalcoholic fatty liver disease, DM2, HTN, DVT (Eliquis), admitted 03/24/20 with progressive LLE pain radiating from L buttock to foot over the last 3 months that has gotten worse acutely. 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 S/p sacral mass biopsy 11/29.   PT Comments    Pt slowly progressing with mobility. Tolerated transfer training and ADL task with RW and intermittent assist for mobility; mobility limited by significant L hip pain (RN aware), as well as generalized weakness and decreased activity tolerance. Pt repositioned in chair with attempts to offload painful hip. Continue to recommend SNF-level therapies to maximize functional mobility and independence prior to return home.    Follow Up Recommendations  SNF;Supervision/Assistance - 24 hour     Equipment Recommendations  3in1 (PT)    Recommendations for Other Services       Precautions / Restrictions Precautions Precautions: Fall Restrictions Weight Bearing Restrictions: No    Mobility  Bed Mobility Overal bed mobility: Needs Assistance Bed Mobility: Rolling;Sidelying to Sit Rolling: Modified independent (Device/Increase time) Sidelying to sit: Min assist;HOB elevated       General bed mobility comments: Restless in bed rolling R/L multiple times due to c/o L hip pain; minA for UE support to elevate trunk from sidelying  Transfers Overall transfer level: Needs assistance Equipment used: Rolling walker (2 wheeled) Transfers: Sit to/from Stand   Stand pivot transfers: Min guard       General transfer comment: Reliant on momentum to power into standing, able to perform multiple sit<>stands from bed and recliner to RW  with min guard for balance; pt tremulous  Ambulation/Gait Ambulation/Gait assistance: Min guard Gait Distance (Feet): 3 Feet Assistive device: Rolling walker (2 wheeled) Gait Pattern/deviations: Step-to pattern;Trunk flexed;Antalgic Gait velocity: Decreased   General Gait Details: Tremulous, antalgic gait with RW and close min guard for balance; further distance limited by c/o significant L hip pain   Stairs             Wheelchair Mobility    Modified Rankin (Stroke Patients Only)       Balance Overall balance assessment: Needs assistance Sitting-balance support: Feet supported Sitting balance-Leahy Scale: Good     Standing balance support: Bilateral upper extremity supported;During functional activity;Single extremity supported Standing balance-Leahy Scale: Poor Standing balance comment: Reliant on single UE support to perform anterior/posterior pericare while standing                            Cognition Arousal/Alertness: Awake/alert Behavior During Therapy: WFL for tasks assessed/performed Overall Cognitive Status: Within Functional Limits for tasks assessed                                 General Comments: WFL for simple tasks; internally distracted by pain but still following commands appropriately      Exercises      General Comments        Pertinent Vitals/Pain Pain Assessment: Faces Faces Pain Scale: Hurts whole lot Pain Location: L hip Pain Descriptors / Indicators: Guarding;Grimacing;Restless Pain Intervention(s): Monitored during session;Patient requesting pain meds-RN notified;Repositioned    Home Living  Prior Function            PT Goals (current goals can now be found in the care plan section) Progress towards PT goals: Progressing toward goals    Frequency    Min 2X/week      PT Plan Current plan remains appropriate    Co-evaluation              AM-PAC PT "6  Clicks" Mobility   Outcome Measure  Help needed turning from your back to your side while in a flat bed without using bedrails?: A Little Help needed moving from lying on your back to sitting on the side of a flat bed without using bedrails?: A Little Help needed moving to and from a bed to a chair (including a wheelchair)?: A Little Help needed standing up from a chair using your arms (e.g., wheelchair or bedside chair)?: A Little Help needed to walk in hospital room?: A Little Help needed climbing 3-5 steps with a railing? : A Lot 6 Click Score: 17    End of Session   Activity Tolerance: Patient tolerated treatment well;Patient limited by pain Patient left: in chair;with call bell/phone within reach Nurse Communication: Mobility status PT Visit Diagnosis: Unsteadiness on feet (R26.81);Muscle weakness (generalized) (M62.81);Other abnormalities of gait and mobility (R26.89)     Time: 9150-5697 PT Time Calculation (min) (ACUTE ONLY): 15 min  Charges:  $Therapeutic Activity: 8-22 mins                    Mabeline Caras, PT, DPT Acute Rehabilitation Services  Pager [phone] Office [place] 03/29/2020, 5:22 PM

## 2020-03-29 NOTE — Progress Notes (Signed)
OT Cancellation Note  Patient Details Name: Tonya Dougherty MRN: [id number] DOB: 1951-03-05   Cancelled Treatment:     Nursing asked therapy to hold secondary to pain.   Lisseth Brazeau 03/29/2020, 12:36 PM

## 2020-03-29 NOTE — Progress Notes (Signed)
PROGRESS NOTE  Carmaleta Youngers DJM:[id number] DOB: 1950-10-19   PCP: Gay Filler, MD  Patient is from: Home. Patient to be changing room 6 complaint today no complaints acute DOA: 03/24/2020 LOS: 3  Chief complaints: Weakness, fatigue and left leg pain  Brief Narrative / Interim history: 69 year old female with history of adenocarcinoma of sigmoid colon status post resection in 06/2295, nonalcoholic fatty liver disease, DM-2 and HTN presenting with progressive LLE pain radiating from her left buttock down to her foot over the last 3 months that has acutely gotten worse over the last week.   MRI thoracolumbar spine reveals sacral lesion on the left with probable early invasion of S1 and S2 neural foramina.  MRI also revealed degenerative changes at L4-5 with severe canal and bilateral subarticular stenosis with moderate L4 foraminal narrowing, left eccentric disc bulge and facet hypertrophy at L3-4 with resultant mild to moderate canal and left lateral recess stenosis and central to left subarticular disc protrusion at L1-2 with resultant mild to moderate spinal stenosis.   CT chest/abdomen/pelvis showed destructive left sacral lesion with associated 2.8 x 3.5 cm soft tissue component anterior to the left sacrum worrisome for metastasis, and 2.6 cm hypoenhancing mass in the right upper kidney.   Patient has CT-guided biopsy of sacral lesion on 03/28/2020.  Pathology pending.  Needs outpatient referral to oncology on discharge.  Therapy recommended SNF.  Waiting on bed.  Subjective: Seen and examined earlier this morning.  No major events overnight of this morning.  She has 6-7 pain earlier this morning.  Pain down to 0 after IV Dilaudid earlier.  She has not a bowel movement yet.  No other complaints.  Objective: Vitals:   03/29/20 0320 03/29/20 0725 03/29/20 0910 03/29/20 1548  BP: (!) 109/48  (!) 150/72 139/70  Pulse: 78  73 79  Resp: 17 16 20 18   Temp: 98.5 F (36.9 C)  (!) 97.5 F  (36.4 C) 98.2 F (36.8 C)  TempSrc: Oral  Oral Oral  SpO2: 92% 93% 93% 99%  Weight:      Height:       No intake or output data in the 24 hours ending 03/29/20 1551 Filed Weights   03/24/20 2140  Weight: 111.9 kg    Examination:  GENERAL: No apparent distress.  Nontoxic. HEENT: MMM.  Vision and hearing grossly intact.  NECK: Supple.  No apparent JVD.  RESP: On RA.  No IWOB.  Fair aeration bilaterally. CVS:  RRR. Heart sounds normal.  ABD/GI/GU: BS+. Abd soft, NTND.  MSK/EXT:  Moves extremities. No apparent deformity. No edema.  SKIN: no apparent skin lesion or wound NEURO: Awake, alert and oriented appropriately.  4+/5 in LLE.  Neuro exam intact elsewhere. PSYCH: Calm. Normal affect.   Procedures:  None  Microbiology summarized: [id number] and influenza PCR nonreactive. Urine culture with 1000 E. coli  Assessment & Plan: Ambulatory dysfunction/LLE pain and weakness-seems radiculopathic.  MRI and CT findings as above.  Now in a lot of pain. -Neurosurgery PA, Kimberley Meyran recommended outpatient follow-up. -Pain control with as needed Tylenol and Percocet and IV Dilaudid based on pain scale -Continue increased dose of gabapentin at 600 mg 3 times daily -PT/OT-recommended SNF.  Sacral lesion concerning for neoplasm: MRI of lumbar spine revealed sacral lesions with invasion of S1 and S2.  No primary source on CT chest/abdomen/pelvis.  Reports urinary and fecal incontinence but this has been going on since she had colectomy.  No bowel movement here. -Had CT-guided biopsy of  sacral lesion on 03/28/2020. -Follow pathology -Referral to oncology on discharge.  Adenocarcinoma of sigmoid colon s/p sigmoidectomy in April 2021.  Pathology with clean margins.  -Needs outpatient follow-up with GI  Acute cystitis with hematuria?  UA concerning but no acute UTI symptoms other than chronic urinary incontinence.  Urine culture with pansensitive E. coli. -Ceftriaxone 11/26> 11/28.>>   Ancef >>11/30   Essential hypertension: Normotensive -Continue home medications  Controlled DM-2 with hyperglycemia: A1c 6.4%. Recent Labs  Lab 03/28/20 1208 03/28/20 1640 03/28/20 2124 03/29/20 0652 03/29/20 1136  GLUCAP 125* 93 177* 114* 115*  -Continue current insulin regimen  GERD without esophagitis -Continue PPI  Tobacco use disorder: -Cessation counseling and nicotine patch   History of DVT (deep vein thrombosis): On Eliquis. -Resume home Eliquis.  Candidal intertrigo -Received Diflucan x2. -Continue nystatin powder and aeration  Pruritus -Continue Benadryl as needed  Anxiety and depression: Stable. -Continue home medications  Leukocytosis: Likely demargination from recent steroid for IV contrast.  Resolved.  Constipation: -MiraLAX and Senokot-S twice daily as needed  Morbid obesity Body mass index is 45.12 kg/m. Nutrition Problem: Increased nutrient needs Etiology: cancer and cancer related treatments-Encourage lifestyle change to lose weight. Signs/Symptoms: estimated needs-Could benefit from GLP-1 inhibitors given diabetes. Interventions: Ensure Enlive (each supplement provides 350kcal and 20 grams of protein), MVI   Stage II sacral pressure injury: POA Pressure Injury 03/25/20 Sacrum Right;Left Stage 2 -  Partial thickness loss of dermis presenting as a shallow open injury with a red, pink wound bed without slough. (Active)  03/25/20 0430  Location: Sacrum  Location Orientation: Right;Left  Staging: Stage 2 -  Partial thickness loss of dermis presenting as a shallow open injury with a red, pink wound bed without slough.  Wound Description (Comments):   Present on Admission: Yes   DVT prophylaxis:    Code Status: Full code Family Communication: Patient and/or RN. Available if any question.  Status is: Inpatient  Remains inpatient appropriate because:Unsafe d/c plan   Dispo: The patient is from: Home              Anticipated d/c is to:  SNF              Anticipated d/c date is: 1 day              Patient currently is medically stable to d/c.               Consultants:  Neurosurgery over the phone Oncology over the phone Interventional radiology   Sch Meds:  Scheduled Meds: . apixaban  5 mg Oral BID  . carvedilol  3.125 mg Oral BID WC  . citalopram  30 mg Oral QHS  . feeding supplement  237 mL Oral BID BM  . ferrous sulfate  325 mg Oral Q breakfast  . furosemide  20 mg Oral BID  . gabapentin  600 mg Oral TID  . insulin aspart  0-15 Units Subcutaneous TID AC & HS  . insulin glargine  40 Units Subcutaneous Q2200  . multivitamin with minerals  1 tablet Oral Daily  . nystatin   Topical BID  . pantoprazole  40 mg Oral Daily  . spironolactone  50 mg Oral Daily  . Zinc Oxide   Topical BID   Continuous Infusions: .  ceFAZolin (ANCEF) IV 1 g (03/29/20 1320)   PRN Meds:.acetaminophen **OR** acetaminophen, diphenhydrAMINE, fluticasone, HYDROmorphone (DILAUDID) injection, ondansetron **OR** ondansetron (ZOFRAN) IV, oxyCODONE-acetaminophen, polyethylene glycol, polyvinyl alcohol  Antimicrobials: Anti-infectives (From admission, onward)  Start     Dose/Rate Route Frequency Ordered Stop   03/27/20 2200  ceFAZolin (ANCEF) IVPB 1 g/50 mL premix        1 g 100 mL/hr over 30 Minutes Intravenous Every 8 hours 03/27/20 1211 03/29/20 2359   03/26/20 0000  cefTRIAXone (ROCEPHIN) 1 g in sodium chloride 0.9 % 100 mL IVPB  Status:  Discontinued        1 g 200 mL/hr over 30 Minutes Intravenous Every 24 hours 03/25/20 0333 03/27/20 1156   03/25/20 1000  fluconazole (DIFLUCAN) tablet 150 mg  Status:  Discontinued        150 mg Oral Daily 03/25/20 0556 03/27/20 0715   03/25/20 0045  cefTRIAXone (ROCEPHIN) 1 g in sodium chloride 0.9 % 100 mL IVPB        1 g 200 mL/hr over 30 Minutes Intravenous  Once 03/25/20 0037 03/25/20 0158       I have personally reviewed the following labs and images: CBC: Recent Labs  Lab  03/24/20 2200 03/24/20 2200 03/25/20 0508 03/26/20 0925 03/27/20 0244 03/28/20 0437 03/29/20 0505  WBC 8.4   < > 8.3 12.3* 17.8* 9.6 8.4  NEUTROABS 5.6  --  6.1  --   --   --   --   HGB 13.0   < > 13.4 13.8 13.6 13.4 12.5  HCT 42.4   < > 44.5 45.8 44.6 43.2 40.5  MCV 94.6   < > 94.7 95.4 94.5 93.7 92.7  PLT 183   < > 170 215 227 214 214   < > = values in this interval not displayed.   BMP &GFR Recent Labs  Lab 03/24/20 2200 03/25/20 0508  NA 140 140  K 4.1 3.9  CL 106 104  CO2 22 23  GLUCOSE 171* 127*  BUN 27* 22  CREATININE 1.01* 0.96  CALCIUM 9.2 9.2  MG  --  2.1   Estimated Creatinine Clearance: 65.3 mL/min (by C-G formula based on SCr of 0.96 mg/dL). Liver & Pancreas: Recent Labs  Lab 03/24/20 2200 03/25/20 0508  AST 23 19  ALT 14 14  ALKPHOS 130* 123  BILITOT 0.8 1.3*  PROT 6.9 7.0  ALBUMIN 2.9* 2.8*   No results for input(s): LIPASE, AMYLASE in the last 168 hours. No results for input(s): AMMONIA in the last 168 hours. Diabetic: No results for input(s): HGBA1C in the last 72 hours. Recent Labs  Lab 03/28/20 1208 03/28/20 1640 03/28/20 2124 03/29/20 0652 03/29/20 1136  GLUCAP 125* 93 177* 114* 115*   Cardiac Enzymes: No results for input(s): CKTOTAL, CKMB, CKMBINDEX, TROPONINI in the last 168 hours. No results for input(s): PROBNP in the last 8760 hours. Coagulation Profile: No results for input(s): INR, PROTIME in the last 168 hours. Thyroid Function Tests: No results for input(s): TSH, T4TOTAL, FREET4, T3FREE, THYROIDAB in the last 72 hours. Lipid Profile: No results for input(s): CHOL, HDL, LDLCALC, TRIG, CHOLHDL, LDLDIRECT in the last 72 hours. Anemia Panel: No results for input(s): VITAMINB12, FOLATE, FERRITIN, TIBC, IRON, RETICCTPCT in the last 72 hours. Urine analysis:    Component Value Date/Time   COLORURINE YELLOW 03/24/2020 2311   APPEARANCEUR CLOUDY (A) 03/24/2020 2311   LABSPEC 1.018 03/24/2020 2311   PHURINE 5.0 03/24/2020  2311   GLUCOSEU NEGATIVE 03/24/2020 2311   HGBUR MODERATE (A) 03/24/2020 2311   BILIRUBINUR NEGATIVE 03/24/2020 2311   [place] 03/24/2020 2311   PROTEINUR 30 (A) 03/24/2020 2311   NITRITE POSITIVE (A) 03/24/2020 2311  LEUKOCYTESUR MODERATE (A) 03/24/2020 2311   Sepsis Labs: Invalid input(s): PROCALCITONIN, [place]  Microbiology: Recent Results (from the past 240 hour(s))  Culture, Urine     Status: Abnormal   Collection Time: 03/24/20 11:11 PM   Specimen: Urine, Clean Catch  Result Value Ref Range Status   Specimen Description URINE, CLEAN CATCH  Final   Special Requests   Final    NONE Performed at [facility], [address], [place], [place] [address]    Culture >=100,000 COLONIES/mL ESCHERICHIA COLI (A)  Final   Report Status 03/27/2020 FINAL  Final   Organism ID, Bacteria ESCHERICHIA COLI (A)  Final      Susceptibility   Escherichia coli - MIC*    AMPICILLIN 4 SENSITIVE Sensitive     CEFAZOLIN <=4 SENSITIVE Sensitive     CEFEPIME <=0.12 SENSITIVE Sensitive     CEFTRIAXONE <=0.25 SENSITIVE Sensitive     CIPROFLOXACIN <=0.25 SENSITIVE Sensitive     GENTAMICIN <=1 SENSITIVE Sensitive     IMIPENEM <=0.25 SENSITIVE Sensitive     NITROFURANTOIN <=16 SENSITIVE Sensitive     TRIMETH/SULFA <=20 SENSITIVE Sensitive     AMPICILLIN/SULBACTAM <=2 SENSITIVE Sensitive     PIP/TAZO <=4 SENSITIVE Sensitive     * >=100,000 COLONIES/mL ESCHERICHIA COLI  Resp Panel by RT-PCR (Flu A&B, Covid) Nasopharyngeal Swab     Status: None   Collection Time: 03/25/20  2:36 AM   Specimen: Nasopharyngeal Swab; Nasopharyngeal(NP) swabs in vial transport medium  Result Value Ref Range Status   SARS Coronavirus 2 by RT PCR NEGATIVE NEGATIVE Final    Comment: (NOTE) SARS-CoV-2 target nucleic acids are NOT DETECTED.  The SARS-CoV-2 RNA is generally detectable in upper respiratory specimens during the acute phase of infection. The lowest concentration of SARS-CoV-2 viral copies  this assay can detect is 138 copies/mL. A negative result does not preclude SARS-Cov-2 infection and should not be used as the sole basis for treatment or other patient management decisions. A negative result may occur with  improper specimen collection/handling, submission of specimen other than nasopharyngeal swab, presence of viral mutation(s) within the areas targeted by this assay, and inadequate number of viral copies(<138 copies/mL). A negative result must be combined with clinical observations, patient history, and epidemiological information. The expected result is Negative.  Fact Sheet for Patients:  EntrepreneurPulse.com.au  Fact Sheet for Healthcare Providers:  IncredibleEmployment.be  This test is no t yet approved or cleared by the [place] FDA and  has been authorized for detection and/or diagnosis of SARS-CoV-2 by FDA under an Emergency Use Authorization (EUA). This EUA will remain  in effect (meaning this test can be used) for the duration of the COVID-19 declaration under Section 564(b)(1) of the Act, 21 U.S.C.section 360bbb-3(b)(1), unless the authorization is terminated  or revoked sooner.       Influenza A by PCR NEGATIVE NEGATIVE Final   Influenza B by PCR NEGATIVE NEGATIVE Final    Comment: (NOTE) The Xpert Xpress SARS-CoV-2/FLU/RSV plus assay is intended as an aid in the diagnosis of influenza from Nasopharyngeal swab specimens and should not be used as a sole basis for treatment. Nasal washings and aspirates are unacceptable for Xpert Xpress SARS-CoV-2/FLU/RSV testing.  Fact Sheet for Patients: EntrepreneurPulse.com.au  Fact Sheet for Healthcare Providers: IncredibleEmployment.be  This test is not yet approved or cleared by the [place] FDA and has been authorized for detection and/or diagnosis of SARS-CoV-2 by FDA under an Emergency Use Authorization (EUA). This EUA  will remain  in effect (meaning this test can be used) for the duration of the COVID-19 declaration under Section 564(b)(1) of the Act, 21 U.S.C. section 360bbb-3(b)(1), unless the authorization is terminated or revoked.  Performed at [facility], [place] [address] [place], [place] [address]     Radiology Studies: CT BIOPSY  Result Date: 2020-04-02 INDICATION: History of colon cancer, enlarging sacral lesion concerning for metastatic disease EXAM: CT core BIOPSY LEFT SACRAL MASS MEDICATIONS: 1% LIDOCAINE LOCAL ANESTHESIA/SEDATION: Moderate (conscious) sedation was employed during this procedure. A total of Versed 1.5 mg and Fentanyl 50 mcg was administered intravenously. Moderate Sedation Time: 15 minutes. The patient's level of consciousness and vital signs were monitored continuously by radiology nursing throughout the procedure under my direct supervision. FLUOROSCOPY TIME:  Fluoroscopy Time: NONE. COMPLICATIONS: None immediate. PROCEDURE: Informed written consent was obtained from the patient after a thorough discussion of the procedural risks, benefits and alternatives. All questions were addressed. Maximal Sterile Barrier Technique was utilized including caps, mask, sterile gowns, sterile gloves, sterile drape, hand hygiene and skin antiseptic. A timeout was performed prior to the initiation of the procedure. previous imaging reviewed. patient positioned prone. noncontrast localization CT performed. the destructive left sacral soft tissue mass was localized and marked for a posterior approach. Under sterile conditions and local anesthesia, a 17 gauge coaxial guide was advanced to the left sacral lesion soft tissue component. Needle position confirmed with CT. Several 18 gauge core biopsies attempted. Sampling was fragmented and placed in formalin. Needle removed. Postprocedure imaging demonstrates no hemorrhage or hematoma. Patient tolerated biopsy well. IMPRESSION: Successful CT-guided left  sacral mass 18 gauge core biopsy Electronically Signed   By: Jerilynn Mages.  Shick M.D.   On: Apr 02, 2020 16:11     Arneisha Kincannon T. [place]  If 7PM-7AM, please contact night-coverage [email or web address] 03/29/2020, 3:51 PM

## 2020-03-29 NOTE — Discharge Instructions (Signed)

## 2020-03-29 NOTE — Care Management Important Message (Signed)
Important Message  Patient Details  Name: Tonya Dougherty MRN: [id number] Date of Birth: 1950-06-04   Medicare Important Message Given:  Yes     [name] P [name] 03/29/2020, 3:24 PM

## 2020-03-30 DIAGNOSIS — C187 Malignant neoplasm of sigmoid colon: Secondary | ICD-10-CM | POA: Diagnosis not present

## 2020-03-30 DIAGNOSIS — I10 Essential (primary) hypertension: Secondary | ICD-10-CM | POA: Diagnosis not present

## 2020-03-30 DIAGNOSIS — N3000 Acute cystitis without hematuria: Secondary | ICD-10-CM | POA: Diagnosis not present

## 2020-03-30 DIAGNOSIS — C7951 Secondary malignant neoplasm of bone: Secondary | ICD-10-CM | POA: Diagnosis not present

## 2020-03-30 LAB — CBC
HCT: 39.3 % (ref 36.0–46.0)
Hemoglobin: 12.4 g/dL (ref 12.0–15.0)
MCH: 29.3 pg (ref 26.0–34.0)
MCHC: 31.6 g/dL (ref 30.0–36.0)
MCV: 92.9 fL (ref 80.0–100.0)
Platelets: 207 10*3/uL (ref 150–400)
RBC: 4.23 MIL/uL (ref 3.87–5.11)
RDW: 20.1 % — ABNORMAL HIGH (ref 11.5–15.5)
WBC: 7 10*3/uL (ref 4.0–10.5)
nRBC: 0 % (ref 0.0–0.2)

## 2020-03-30 LAB — GLUCOSE, CAPILLARY
Glucose-Capillary: 98 mg/dL (ref 70–99)
Glucose-Capillary: 110 mg/dL — ABNORMAL HIGH (ref 70–99)
Glucose-Capillary: 155 mg/dL — ABNORMAL HIGH (ref 70–99)
Glucose-Capillary: 177 mg/dL — ABNORMAL HIGH (ref 70–99)
Glucose-Capillary: 172 mg/dL — ABNORMAL HIGH (ref 70–99)

## 2020-03-30 LAB — SURGICAL PATHOLOGY

## 2020-03-30 MED ORDER — LIP MEDEX EX OINT
TOPICAL_OINTMENT | CUTANEOUS | Status: DC | PRN
  Filled 2020-03-30 (×2): qty 7

## 2020-03-30 NOTE — TOC Initial Note (Signed)
Transition of Care [facility]) - Initial/Assessment Note    Patient Details  Name: Tonya Dougherty MRN: [id number] Date of Birth: 05-Dec-1950  Transition of Care [facility]) CM/SW Contact:    Curlene Labrum, RN Phone Number: 03/30/2020, 2:28 PM  Clinical Narrative:                 Case management met with the patient at the bedside regarding transition of care and work up for Left sacral lesion and S/P biopsy on 03/28/2020 with pending results.  The patient lives with a roommate and grandson and she is agreeable to [facility] placement upon discharge if unable to return home safely.  The patient will be worked up for [facility] placement and is currently fully vaccinated for COVID with phfizer vaccine x 2.  Will continue to follow for medical clearance and SNF placement.  Expected Discharge Plan: [place] Barriers to Discharge: Continued Medical Work up   Patient Goals and CMS Choice Patient states their goals for this hospitalization and ongoing recovery are:: Patient plans to discharge to [facility] facility if she is unable to to home. CMS Medicare.gov Compare Post Acute Care list provided to:: Patient Choice offered to / list presented to : Patient  Expected Discharge Plan and Services Expected Discharge Plan: [place]   Discharge Planning Services: CM Consult Post Acute Care Choice: [place] Living arrangements for the past 2 months: Single Family Home                                      Prior Living Arrangements/Services Living arrangements for the past 2 months: Single Family Home Lives with:: Roommate, Relatives (lives with a "roommate" and grandson) Patient language and need for interpreter reviewed:: Yes Do you feel safe going back to the place where you live?: Yes      Need for Family Participation in Patient Care: Yes (Comment) Care giver support system in place?: Yes (comment) Current home services: DME Criminal Activity/Legal  Involvement Pertinent to Current Situation/Hospitalization: No - Comment as needed  Activities of Daily Living Home Assistive Devices/Equipment: Gilford Rile (specify type) ADL Screening (condition at time of admission) Patient's cognitive ability adequate to safely complete daily activities?: Yes Is the patient deaf or have difficulty hearing?: No Does the patient have difficulty seeing, even when wearing glasses/contacts?: No Does the patient have difficulty concentrating, remembering, or making decisions?: No Patient able to express need for assistance with ADLs?: Yes Does the patient have difficulty dressing or bathing?: No Independently performs ADLs?: Yes (appropriate for developmental age) Does the patient have difficulty walking or climbing stairs?: Yes Weakness of Legs: Both Weakness of Arms/Hands: Both  Permission Sought/Granted Permission sought to share information with : Case Manager Permission granted to share information with : Yes, Verbal Permission Granted     Permission granted to share info w AGENCY: SNF facility        Emotional Assessment Appearance:: Appears stated age Attitude/Demeanor/Rapport: Engaged Affect (typically observed): Accepting Orientation: : Oriented to Self, Oriented to Place, Oriented to  Time, Oriented to Situation Alcohol / Substance Use: Not Applicable Psych Involvement: No (comment)  Admission diagnosis:  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 Sacral lesion 03/26/2020  . 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  Acute cystitis without hematuria 03/25/2020  . Essential hypertension 03/25/2020  . GERD without esophagitis 03/25/2020  . Mixed diabetic hyperlipidemia associated with type 2 diabetes mellitus ([place]) 03/25/2020  . Nicotine dependence, cigarettes,  uncomplicated 72/12/1978  . History of DVT (deep vein thrombosis) 03/25/2020  . Type 2 diabetes mellitus with hyperglycemia, with long-term current use of insulin ([place]) 03/25/2020  . Pressure injury of skin 03/25/2020  . Adenocarcinoma of sigmoid colon ([place]) 08/14/2019  . DVT (deep venous thrombosis) ([place]) 10/22/2017   PCP:  Gay Filler, MD Pharmacy:   [id number] [place] [id number]: [phone]: [phone]    Social Determinants of Health (SDOH) Interventions    Readmission Risk Interventions Readmission Risk Prevention Plan 03/30/2020  Transportation Screening Complete  PCP or Specialist Appt within 5-7 Days Complete  Home Care Screening Complete  Medication Review (RN CM) Complete  Some recent data might be hidden

## 2020-03-30 NOTE — Progress Notes (Signed)
Nutrition Follow-up  DOCUMENTATION CODES:   Morbid obesity  INTERVENTION:   -Continue Ensure Enlive po BID, each supplement provides 350 kcal and 20 grams of protein -Continue MVI with minerals daily  NUTRITION DIAGNOSIS:   Increased nutrient needs related to cancer and cancer related treatments as evidenced by estimated needs.  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  11/26- CT of abdomen/pelvis/ chest revealed destructive left sacral lesion with associated 2.8 x 3.5 cm soft tissue component anterior to the left sacrum worrisome for metastasis, and 2.6 cm hypoenhancing mass in the right upper kidney; MRI revealed severe canal stenosis at L4-5 11/29- s/p sacral mass biopsy  Reviewed I/O's: +320 ml x 24 hours and +105 ml since admission  UOP: 400 ml x 24 hours  Spoke with pt at bedside, who was not very talkative at time of visit. Pt reports good appetite, consumed 100% of lunch. She reports that she has also been consuming 100% of Ensure supplements and enjoys them.   Pt reports good appetite PTA, however, did not provide further diet history despite probing.   Discussed importance of good meal and supplement intake to promote healing.   Medications reviewed and include lasix.   Labs reviewed: CBGS: 98-155 (inpatient orders for glycemic control are 0-15 units insulin aspart before  meals ans at bedtime and 40 units insulin glargine daily).   NUTRITION - FOCUSED PHYSICAL EXAM:    Most Recent Value  Orbital Region No depletion  Upper Arm Region No depletion  Thoracic and Lumbar Region No depletion  Buccal Region No depletion  Temple Region No depletion  Clavicle Bone Region No depletion  Clavicle and Acromion Bone Region No depletion  Scapular Bone Region No depletion  Dorsal Hand No depletion  Patellar Region Mild depletion  Anterior Thigh Region Mild depletion  Posterior Calf Region Mild depletion  Edema (RD Assessment) Mild  Hair Reviewed  Eyes Reviewed  Mouth Reviewed  Skin Reviewed  Nails Reviewed       Diet Order:   Diet Order            Diet Carb Modified Fluid consistency: Thin; Room service appropriate? Yes  Diet effective now                 EDUCATION NEEDS:   No education needs have been identified at this time  Skin:  Skin Assessment: Skin Integrity Issues: Skin Integrity Issues:: Incisions Stage II: sacrum Incisions: lt sacrum  Last BM:  03/26/20  Height:   Ht Readings from Last 1 Encounters:  03/24/20 5\' 2"  (1.575 m)    Weight:   Wt Readings from Last 1 Encounters:  03/24/20 111.9 kg    Ideal Body Weight:  50 kg  BMI:  Body mass index is 45.12 kg/m.  Estimated Nutritional Needs:   Kcal:  2000-2200  Protein:  110-125 grams  Fluid:  > 2 L    Loistine Chance, RD, LDN, [place] Registered Dietitian  II Certified Diabetes Care and Education Specialist Please refer to [facility] for RD and/or RD on-call/weekend/after hours pager

## 2020-03-30 NOTE — NC FL2 (Signed)
[place] LEVEL OF CARE SCREENING TOOL     IDENTIFICATION  Patient Name: Tonya Dougherty Birthdate: Jan 20, 1951 Sex: female Admission Date (Current Location): 03/24/2020  [facility] and [place] Number:  Herbalist and Address:  The [place]. [facility], [place] [address] [place], [place] [address]      Provider Number: [id number]  Attending Physician Name and Address:  Tawni Millers,*  Relative Name and Phone Number:  Gust Rung - friend - [phone]    Current Level of Care: Hospital Recommended Level of Care: [place] Prior Approval Number:    Date Approved/Denied:   PASRR Number: [id number]  Discharge Plan: SNF    Current Diagnoses: Patient Active Problem List   Diagnosis Date Noted  . Sacral lesion 03/26/2020  . 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 Acute cystitis without hematuria 03/25/2020  . Essential hypertension 03/25/2020  . GERD without esophagitis 03/25/2020  . Mixed diabetic hyperlipidemia associated with type 2 diabetes mellitus ([place]) 03/25/2020  . Nicotine dependence, cigarettes, uncomplicated 68/34/1962  . History of DVT (deep vein thrombosis) 03/25/2020  . Type 2 diabetes mellitus with hyperglycemia, with long-term current use of insulin ([place]) 03/25/2020  . Pressure injury of skin 03/25/2020  . Adenocarcinoma of sigmoid colon ([place]) 08/14/2019  . DVT (deep venous thrombosis) ([place]) 10/22/2017    Orientation RESPIRATION BLADDER Height & Weight     Self, Time, Situation, Place  Normal Continent Weight: 111.9 kg Height:  5\' 2"  (157.5 cm)  BEHAVIORAL SYMPTOMS/MOOD NEUROLOGICAL BOWEL NUTRITION STATUS      Continent Diet (See discharge summary)  AMBULATORY STATUS COMMUNICATION OF NEEDS Skin   Limited Assist Verbally Other (Comment) (dermatitis on breasts, abdomen, groin)                       Personal Care Assistance Level of  Assistance  Bathing, Feeding, Dressing Bathing Assistance: Limited assistance Feeding assistance: Independent Dressing Assistance: Limited assistance     Functional Limitations Info  Sight, Hearing, Speech Sight Info: Adequate Hearing Info: Adequate Speech Info: Adequate    SPECIAL CARE FACTORS FREQUENCY  PT (By licensed PT), OT (By licensed OT)     PT Frequency: 5 x per week OT Frequency: 5 x per week            Contractures Contractures Info: Not present    Additional Factors Info  Code Status, Allergies Code Status Info: Full code Allergies Info: xarelto, erythromycin, lipitor, pravachol, IVP dye           Current Medications (03/30/2020):  This is the current hospital active medication list Current Facility-Administered Medications  Medication Dose Route Frequency Provider Last Rate Last Admin  . acetaminophen (TYLENOL) tablet 650 mg  650 mg Oral Q6H PRN Vernelle Emerald, MD   650 mg at 03/29/20 0001   Or  . acetaminophen (TYLENOL) suppository 650 mg  650 mg Rectal Q6H PRN Shalhoub, Sherryll Burger, MD      . apixaban (ELIQUIS) tablet 5 mg  5 mg Oral BID Wendee Beavers T, MD   5 mg at 03/30/20 1025  . carvedilol (COREG) tablet 3.125 mg  3.125 mg Oral BID WC Shalhoub, Sherryll Burger, MD   3.125 mg at 03/30/20 1025  . citalopram (CELEXA) tablet 30 mg  30 mg Oral QHS Wendee Beavers T, MD   30 mg at 03/29/20 2109  . diphenhydrAMINE (BENADRYL) capsule 25 mg  25 mg Oral  Q8H PRN Mercy Riding, MD   25 mg at 03/27/20 1743  . feeding supplement (ENSURE ENLIVE / ENSURE PLUS) liquid 237 mL  237 mL Oral BID BM Shalhoub, Sherryll Burger, MD   237 mL at 03/30/20 1028  . ferrous sulfate tablet 325 mg  325 mg Oral Q breakfast Wendee Beavers T, MD   325 mg at 03/30/20 1025  . fluticasone (FLONASE) 50 MCG/ACT nasal spray 1-2 spray  1-2 spray Each Nare Daily PRN Shalhoub, Sherryll Burger, MD      . furosemide (LASIX) tablet 20 mg  20 mg Oral BID Wendee Beavers T, MD   20 mg at 03/30/20 1026  . gabapentin (NEURONTIN)  capsule 600 mg  600 mg Oral TID Wendee Beavers T, MD   600 mg at 03/30/20 1025  . HYDROmorphone (DILAUDID) injection 0.5 mg  0.5 mg Intravenous Q3H PRN Wendee Beavers T, MD   0.5 mg at 03/29/20 1844  . insulin aspart (novoLOG) injection 0-15 Units  0-15 Units Subcutaneous TID AC & HS Shalhoub, Sherryll Burger, MD   3 Units at 03/30/20 1239  . insulin glargine (LANTUS) injection 40 Units  40 Units Subcutaneous Q2200 Vernelle Emerald, MD   40 Units at 03/29/20 2111  . lip balm (CARMEX) ointment   Topical PRN Arrien, Jimmy Picket, MD      . multivitamin with minerals tablet 1 tablet  1 tablet Oral Daily Wendee Beavers T, MD   1 tablet at 03/30/20 1025  . nystatin (MYCOSTATIN/NYSTOP) topical powder   Topical BID Vernelle Emerald, MD   Given at 03/30/20 1026  . ondansetron (ZOFRAN) tablet 4 mg  4 mg Oral Q6H PRN Shalhoub, Sherryll Burger, MD       Or  . ondansetron [facility]) injection 4 mg  4 mg Intravenous Q6H PRN Shalhoub, Sherryll Burger, MD      . oxyCODONE-acetaminophen (PERCOCET/ROXICET) 5-325 MG per tablet 1 tablet  1 tablet Oral Q4H PRN Mercy Riding, MD   1 tablet at 03/30/20 1031  . pantoprazole (PROTONIX) EC tablet 40 mg  40 mg Oral Daily Shalhoub, Sherryll Burger, MD   40 mg at 03/30/20 1026  . polyethylene glycol (MIRALAX / GLYCOLAX) packet 17 g  17 g Oral Daily PRN Vernelle Emerald, MD   17 g at 03/29/20 0916  . polyvinyl alcohol (LIQUIFILM TEARS) 1.4 % ophthalmic solution 1 drop  1 drop Both Eyes TID PRN Shalhoub, Sherryll Burger, MD      . senna-docusate (Senokot-S) tablet 1 tablet  1 tablet Oral BID PRN Wendee Beavers T, MD      . spironolactone (ALDACTONE) tablet 50 mg  50 mg Oral Daily Wendee Beavers T, MD   50 mg at 03/30/20 1025  . Zinc Oxide (TRIPLE PASTE) 12.8 % ointment   Topical BID Shalhoub, Sherryll Burger, MD   Given at 03/30/20 1026     Discharge Medications: Please see discharge summary for a list of discharge medications.  Relevant Imaging Results:  Relevant Lab Results:   Additional Information SS#  [id number]  Curlene Labrum, RN

## 2020-03-30 NOTE — Progress Notes (Signed)
PROGRESS NOTE    Tonya Dougherty  WFU:[id number] DOB: 11-29-1950 DOA: 03/24/2020 PCP: Gay Filler, MD    Brief Narrative:  Tonya Dougherty was admitted to the hospital with a working diagnosis of ambulatory dysfunction due to metastatic adenocarcinoma at the sacrum on the left with invasion of S1 and S2 neural foramina.  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  Presents with progressive weakness and left buttock/thigh pain. Patient had a subacute left hip/buttock pain, that was refractory to outpatient management.  Over the last 10 days prior to hospitalization she developed difficulty ambulating and progressive weakness in the left lower extremity.  On her initial physical examination blood pressure 136/56, heart rate 87, respirate 15, oxygen saturation 95%, her lungs were clear to auscultation bilaterally, heart S1-S2, present rhythmic, soft abdomen and no lower extremity edema.  Thoracic lumbar MRI positive for hyperintense lesions involving the central and left aspect of the sacrum, concerning for osseous metastatic disease.  Probably early invasion of the adjacent left S1-S2 neural foramina. Positive degenerative disc disease,  Further work-up with CT chest, abdomen pelvis showed destructive left sacral lesion with associated 2.8 x 3.5 cm soft tissue component anterior to the left sacrum worrisome for metastasis.  2.6 cm hypoenhancing mass in the right upper kidney.  CT-guided biopsies 11/29 of sacral lesion showing metastatic adenocarcinoma consistent with gastrointestinal primary.  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  Biopsy positive for adenocarcinoma of intestinal origin.   Continue pain control (oxycodone and hydromorphone) and will consult oncology, patient may need local radiation to bony lesion for pain control.   2. Acute cystitis. Chronic incontinence. Patient on antibiotic therapy with ancef.   3. HTN. Continue blood pressure monitoring, continue with carvedilol and furosemide/ spironolactone.   4. T2DM. Continue glucose control and monitoring with insulin sliding scale. Basal insulin 40 units glargine.   5. Obesity class 3/ stage 2 sacrum pressure ucler (present on admission). BMI is 45.12. Continue with local skin care.   6. Anxiety and depression. Patient will need family support. Continue current medical regimen with citalopram.   7. Hx of DVT.Continue anticoagulation with apixaban.      Status is: Inpatient  Remains inpatient appropriate because:Inpatient level of care appropriate due to severity of illness   Dispo: The patient is from: Home              Anticipated d/c is to: SNF              Anticipated d/c date is: 2 days              Patient currently is not medically stable to d/c.   DVT prophylaxis: apixaban  Code Status:   full  Family Communication:  No family at the bedside      Nutrition Status: Nutrition Problem: Increased  nutrient needs Etiology: cancer and cancer related treatments Signs/Symptoms: estimated needs Interventions: Ensure Enlive (each supplement provides 350kcal and 20 grams of protein), MVI     Skin Documentation: Pressure Injury 03/25/20  Sacrum Right;Left Stage 2 -  Partial thickness loss of dermis presenting as a shallow open injury with a red, pink wound bed without slough. (Active)  03/25/20 0430  Location: Sacrum  Location Orientation: Right;Left  Staging: Stage 2 -  Partial thickness loss of dermis presenting as a shallow open injury with a red, pink wound bed without slough.  Wound Description (Comments):   Present on Admission: Yes       Subjective: Patient continue to have back and thigh pain, very weak and deconditioned, no nausea or vomiting.   Objective: Vitals:   03/29/20 1918 03/30/20 0332 03/30/20 0828 03/30/20 1513  BP: 116/67 110/62 (!) 123/43 (!) 105/42  Pulse: 81 79 75 74  Resp: 15 15 16 14   Temp: 98.2 F (36.8 C) 98 F (36.7 C) 97.7 F (36.5 C) 98.2 F (36.8 C)  TempSrc: Oral Oral Oral Oral  SpO2: 94% 95% 91% 94%  Weight:      Height:        Intake/Output Summary (Last 24 hours) at 03/30/2020 1640 Last data filed at 03/30/2020 1239 Gross per 24 hour  Intake 240 ml  Output 1100 ml  Net -860 ml   Filed Weights   03/24/20 2140  Weight: 111.9 kg    Examination:   General: Not in pain or dyspnea. Deconditioned  Neurology: Awake and alert, non focal  E ENT: no pallor, no icterus, oral mucosa moist Cardiovascular: No JVD. S1-S2 present, rhythmic, no gallops, rubs, or murmurs. No lower extremity edema. Pulmonary: positive breath sounds bilaterally, adequate air movement, no wheezing, rhonchi or rales. Gastrointestinal. Abdomen soft and non tender Skin. No rashes Musculoskeletal: no joint deformities     Data Reviewed: I have personally reviewed following labs and imaging studies  CBC: Recent Labs  Lab 03/24/20 2200 03/24/20 2200 03/25/20 0508 03/25/20 0508 03/26/20 0925 03/27/20 0244 03/28/20 0437 03/29/20 0505 03/30/20 0138  WBC 8.4   < > 8.3   < > 12.3* 17.8* 9.6 8.4 7.0  NEUTROABS 5.6  --  6.1  --   --   --   --   --   --   HGB 13.0   < > 13.4   < > 13.8 13.6 13.4  12.5 12.4  HCT 42.4   < > 44.5   < > 45.8 44.6 43.2 40.5 39.3  MCV 94.6   < > 94.7   < > 95.4 94.5 93.7 92.7 92.9  PLT 183   < > 170   < > 215 227 214 214 207   < > = values in this interval not displayed.   Basic Metabolic Panel: Recent Labs  Lab 03/24/20 2200 03/25/20 0508  NA 140 140  K 4.1 3.9  CL 106 104  CO2 22 23  GLUCOSE 171* 127*  BUN 27* 22  CREATININE 1.01* 0.96  CALCIUM 9.2 9.2  MG  --  2.1   GFR: Estimated Creatinine Clearance: 65.3 mL/min (by C-G formula based on SCr of 0.96 mg/dL). Liver Function Tests: Recent Labs  Lab 03/24/20 2200 03/25/20 0508  AST 23 19  ALT 14 14  ALKPHOS 130* 123  BILITOT 0.8 1.3*  PROT 6.9 7.0  ALBUMIN 2.9* 2.8*   No results for input(s): LIPASE, AMYLASE in the last 168 hours. No results for  input(s): AMMONIA in the last 168 hours. Coagulation Profile: No results for input(s): INR, PROTIME in the last 168 hours. Cardiac Enzymes: No results for input(s): CKTOTAL, CKMB, CKMBINDEX, TROPONINI in the last 168 hours. BNP (last 3 results) No results for input(s): PROBNP in the last 8760 hours. HbA1C: No results for input(s): HGBA1C in the last 72 hours. CBG: Recent Labs  Lab 03/29/20 2112 03/30/20 0656 03/30/20 0738 03/30/20 1114 03/30/20 1600  GLUCAP 184* 98 110* 155* 177*   Lipid Profile: No results for input(s): CHOL, HDL, LDLCALC, TRIG, CHOLHDL, LDLDIRECT in the last 72 hours. Thyroid Function Tests: No results for input(s): TSH, T4TOTAL, FREET4, T3FREE, THYROIDAB in the last 72 hours. Anemia Panel: No results for input(s): VITAMINB12, FOLATE, FERRITIN, TIBC, IRON, RETICCTPCT in the last 72 hours.    Radiology Studies: I have reviewed all of the imaging during this hospital visit personally     Scheduled Meds: . apixaban  5 mg Oral BID  . carvedilol  3.125 mg Oral BID WC  . citalopram  30 mg Oral QHS  . feeding supplement  237 mL Oral BID BM  . ferrous sulfate  325 mg Oral Q breakfast  . furosemide  20 mg  Oral BID  . gabapentin  600 mg Oral TID  . insulin aspart  0-15 Units Subcutaneous TID AC & HS  . insulin glargine  40 Units Subcutaneous Q2200  . multivitamin with minerals  1 tablet Oral Daily  . nystatin   Topical BID  . pantoprazole  40 mg Oral Daily  . spironolactone  50 mg Oral Daily  . Zinc Oxide   Topical BID   Continuous Infusions:   LOS: 4 days        Emanuelle Hammerstrom Gerome Apley, MD

## 2020-03-30 NOTE — Progress Notes (Signed)
Occupational Therapy Treatment Patient Details Name: Tonya Dougherty MRN: [id number] DOB: 22-Jun-1950 Today's Date: 03/30/2020    History of present illness Pt is a 69 y.o. female with a medical history significant for adenocarcinoma of sigmoid colon s/p resection 09/7589, nonalcoholic fatty liver disease, DM2, HTN, DVT (Eliquis), admitted 03/24/20 with progressive LLE pain radiating from L buttock to foot over the last 3 months that has gotten worse acutely. 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 S/p sacral mass biopsy 11/29.   OT comments  Pt progressing towards acute OT goals. Onset of tremors and "woozy" during OOB activity (walked 3'). Min A with mobility to steady. Returned to sidelying to recover, reported feeling better after several minutes. D/c plan remains appropriate.    Follow Up Recommendations  SNF;Supervision/Assistance - 24 hour    Equipment Recommendations  3 in 1 bedside commode    Recommendations for Other Services      Precautions / Restrictions Precautions Precautions: Fall Restrictions Weight Bearing Restrictions: No       Mobility Bed Mobility Overal bed mobility: Needs Assistance Bed Mobility: Sidelying to Sit;Sit to Sidelying   Sidelying to sit: Min assist;HOB elevated     Sit to sidelying: Min assist General bed mobility comments: light powerup assist at trunk level to come to EOB. Light BLE assist to advance BLE onto bed at end of session 2/2 "woozy"  Transfers Overall transfer level: Needs assistance Equipment used: Rolling walker (2 wheeled) Transfers: Sit to/from [facility] Sit to Stand: Min guard Stand pivot transfers: Min assist       General transfer comment: assist 2/2 onset of wooziness. tremulous with OOB activity    Balance Overall balance assessment: Needs assistance Sitting-balance support: Feet supported Sitting balance-Leahy Scale: Good     Standing balance support: Bilateral  upper extremity supported;During functional activity;Single extremity supported Standing balance-Leahy Scale: Poor                             ADL either performed or assessed with clinical judgement   ADL Overall ADL's : Needs assistance/impaired                         Toilet Transfer: Minimal assistance;Stand-pivot;BSC;RW Toilet Transfer Details (indicate cue type and reason): simulated. Became wozozy and tremulous BUE walking 3". Cued to return to seated position and ultimately supine.          Functional mobility during ADLs: Minimal assistance;Rolling walker General ADL Comments: exhibits increased tremors with functional movement (ongoing). bed mobility, sat EOB a minute. Once in standing pt become tremulous and reported feeling woozy while walking about 3'. Cued to return to EOB and due to worsening symptoms returned to supine. Mostly recovered after a few minutes in sidelying position.     Vision       Perception     Praxis      Cognition Arousal/Alertness: Awake/alert Behavior During Therapy: Impulsive (impulsive/restless? once in standing) Overall Cognitive Status: No family/caregiver present to determine baseline cognitive functioning Area of Impairment: Safety/judgement;Following commands                       Following Commands: Follows multi-step commands consistently Safety/Judgement: Decreased awareness of safety     General Comments: Internally distracted and restless/impulsive once in standing. Cued to terminate activity 2/2 onset of "wooziness", unsteadiness, and tremulous BUE while pt attempting to walk.  Exercises     Shoulder Instructions       General Comments      Pertinent Vitals/ Pain       Pain Assessment: Faces Faces Pain Scale: Hurts even more Pain Location: L hip in standing Pain Descriptors / Indicators: Guarding;Grimacing;Restless Pain Intervention(s): Monitored during session;Limited  activity within patient's tolerance;Premedicated before session;Repositioned  Home Living                                          Prior Functioning/Environment              Frequency  Min 2X/week        Progress Toward Goals  OT Goals(current goals can now be found in the care plan section)  Progress towards OT goals: Progressing toward goals  Acute Rehab OT Goals Patient Stated Goal: to go home OT Goal Formulation: With patient Time For Goal Achievement: 04/09/20 Potential to Achieve Goals: Good ADL Goals Pt Will Perform Grooming: with modified independence;standing Pt Will Perform Upper Body Bathing: with modified independence;sitting;standing Pt Will Perform Lower Body Bathing: with modified independence;sitting/lateral leans;sit to/from stand Pt Will Perform Upper Body Dressing: with modified independence;sitting;standing Pt Will Perform Lower Body Dressing: with modified independence;sitting/lateral leans;sit to/from stand Pt Will Transfer to Toilet: with modified independence;ambulating;bedside commode Pt Will Perform Toileting - Clothing Manipulation and hygiene: with modified independence;sitting/lateral leans;sit to/from stand Pt Will Perform Tub/Shower Transfer: Tub transfer;Shower transfer;with modified independence;ambulating;shower seat;rolling walker Pt/caregiver will Perform Home Exercise Program: Increased ROM;Increased strength;Both right and left upper extremity;With Supervision Additional ADL Goal #1: Pt will verbalize/demonstrate 3 fall prevention strategies and 3 energy conservation strategies to incorporate into ADLs/ADL mobility to increase safety awareness and activity tolerance. Additional ADL Goal #2: OT will administer formal cognitive assessment (e.g. MOCA) and set goals accordingly.  Plan Discharge plan remains appropriate    Co-evaluation                 AM-PAC OT "6 Clicks" Daily Activity     Outcome Measure    Help from another person eating meals?: None Help from another person taking care of personal grooming?: A Little Help from another person toileting, which includes using toliet, bedpan, or urinal?: Total Help from another person bathing (including washing, rinsing, drying)?: A Lot Help from another person to put on and taking off regular upper body clothing?: A Little Help from another person to put on and taking off regular lower body clothing?: A Lot 6 Click Score: 15    End of Session Equipment Utilized During Treatment: Rolling walker  OT Visit Diagnosis: Unsteadiness on feet (R26.81);Repeated falls (R29.6);Pain;Muscle weakness (generalized) (M62.81) Pain - Right/Left: Left Pain - part of body: Hip   Activity Tolerance Other (comment) (onset of tremors and '"woozy" once OOB)   Patient Left in bed;with call bell/phone within reach   Nurse Communication Other (comment);Mobility status (woozy and tremulous with brief OOB activity)        Time: 6333-5456 OT Time Calculation (min): 26 min  Charges: OT General Charges $OT Visit: 1 Visit OT Treatments $Self Care/Home Management : 23-37 mins  Tyrone Schimke, OT Acute Rehabilitation Services Pager: [phone] Office: [phone]    Hortencia Pilar 03/30/2020, 12:38 PM

## 2020-03-31 ENCOUNTER — Encounter (HOSPITAL_COMMUNITY): Payer: Self-pay | Admitting: Student

## 2020-03-31 ENCOUNTER — Other Ambulatory Visit: Payer: Self-pay | Admitting: Oncology

## 2020-03-31 ENCOUNTER — Ambulatory Visit
Admit: 2020-03-31 | Discharge: 2020-03-31 | Disposition: A | Discharge status: Home / Self Care | Payer: Medicare HMO | Attending: Radiation Oncology | Admitting: Radiation Oncology

## 2020-03-31 DIAGNOSIS — N3000 Acute cystitis without hematuria: Secondary | ICD-10-CM | POA: Diagnosis not present

## 2020-03-31 DIAGNOSIS — C187 Malignant neoplasm of sigmoid colon: Secondary | ICD-10-CM

## 2020-03-31 DIAGNOSIS — I10 Essential (primary) hypertension: Secondary | ICD-10-CM | POA: Diagnosis not present

## 2020-03-31 DIAGNOSIS — C7951 Secondary malignant neoplasm of bone: Secondary | ICD-10-CM | POA: Diagnosis not present

## 2020-03-31 LAB — GLUCOSE, CAPILLARY
Glucose-Capillary: 122 mg/dL — ABNORMAL HIGH (ref 70–99)
Glucose-Capillary: 145 mg/dL — ABNORMAL HIGH (ref 70–99)
Glucose-Capillary: 136 mg/dL — ABNORMAL HIGH (ref 70–99)
Glucose-Capillary: 209 mg/dL — ABNORMAL HIGH (ref 70–99)

## 2020-03-31 LAB — CBC
HCT: 43.3 % (ref 36.0–46.0)
Hemoglobin: 13.3 g/dL (ref 12.0–15.0)
MCH: 28.8 pg (ref 26.0–34.0)
MCHC: 30.7 g/dL (ref 30.0–36.0)
MCV: 93.7 fL (ref 80.0–100.0)
Platelets: 204 10*3/uL (ref 150–400)
RBC: 4.62 MIL/uL (ref 3.87–5.11)
RDW: 20.1 % — ABNORMAL HIGH (ref 11.5–15.5)
WBC: 6.9 10*3/uL (ref 4.0–10.5)
nRBC: 0 % (ref 0.0–0.2)

## 2020-03-31 MED ORDER — FLEET ENEMA 7-19 GM/118ML RE ENEM
1.0000 | ENEMA | Freq: Once | RECTAL | Status: AC
  Administered 2020-03-31: 1 via RECTAL
  Filled 2020-03-31: qty 1

## 2020-03-31 MED ORDER — POLYETHYLENE GLYCOL 3350 17 G PO PACK
17.0000 g | PACK | Freq: Two times a day (BID) | ORAL | Status: DC
  Administered 2020-03-31 – 2020-04-14 (×21): 17 g via ORAL
  Filled 2020-03-31 (×21): qty 1

## 2020-03-31 MED ORDER — OXYCODONE HCL ER 10 MG PO T12A
10.0000 mg | EXTENDED_RELEASE_TABLET | Freq: Two times a day (BID) | ORAL | Status: DC
  Administered 2020-03-31 – 2020-04-14 (×29): 10 mg via ORAL
  Filled 2020-03-31 (×29): qty 1

## 2020-03-31 MED ORDER — HYDROMORPHONE HCL 1 MG/ML IJ SOLN: 1.0000 mg | INTRAMUSCULAR | Status: DC | PRN

## 2020-03-31 NOTE — Plan of Care (Signed)

## 2020-03-31 NOTE — Consult Note (Addendum)
[place]  [phone]) [phone] [phone]   MEDICAL ONCOLOGY - INITIAL CONSULTATION  Referral MD: Dr. 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  Tonya Dougherty presented to the hospital with left buttock and thigh pain.  Pain has been present since this past summer.  Tonya Dougherty has been seen by her primary care provider diagnosed her with piriformis syndrome.  Tonya Dougherty underwent several types of therapy as well as nonopiate-based analgesics, gabapentin, and multiple courses of steroids without improvement.  Tonya Dougherty was eventually placed on oxycodone for worsening pain and referred to [facility] for steroid injection.  An MRI of the lumbar spine was ordered but the Tonya Dougherty did not have it done.  Due to worsening pain that radiated down to the foot, Tonya Dougherty came to the emergency room.  Additionally, Tonya Dougherty was also experiencing difficulty with ambulation, decreased appetite and weight loss, and intermittent nausea over the past 2 weeks.  MRI of the thoracic and lumbar spine was performed on 03/24/2020 which showed abnormal T2/STIR hyperintense lesion involving the central and left aspect of the sacrum concerning for osseous metastatic disease and probable early invasion of the adjacent left S1 and S2 neural foramina with extraosseous extension into the adjacent presacral space.  CT of the abdomen/pelvis with contrast performed on 03/25/2020 showed destructive left sacral lesion with associated 2.8 x 3.5 cm soft tissue component anterior to the left sacrum, mildly increased concerning for metastasis, 2.6 cm hypoenhancing mass in the right upper  kidney which is new in the appearance could represent infection but neoplastic would favor metastasis or renal cell carcinoma.  Tonya Dougherty was also noted to have cirrhosis and splenomegaly. Tonya Dougherty underwent CT-guided biopsy of the left sacral mass on 03/28/2020 and results showed metastatic adenocarcinoma consistent with GI primary.  The Tonya Dougherty is being seen by radiation oncology today for consideration of palliative radiation to her sacrum.  The Tonya Dougherty is sitting up in the recliner chair today.  No family at the bedside.  The Tonya Dougherty reports that her left buttock and thigh pain is significantly improved with adjustment in pain medication.  Tonya Dougherty is able to walk currently.  Tonya Dougherty denies headaches, dizziness, vision changes.  Denies chest pain, shortness of breath, cough.  Denies abdominal pain, nausea, vomiting.  Reports constipation secondary to pain medication.  States stools have been dark in color but Tonya Dougherty also takes oral iron at home.  No obvious blood noted.  The Tonya Dougherty denies family history of GI malignancy.  The Tonya Dougherty lives with a roommate.  Has 2 children.  Denies history of alcohol use.  Smokes 1/2 to 1 pack of cigarettes daily since age 59.  Trying to quit.  The Tonya Dougherty states that Tonya Dougherty was previously advised to follow-up with medical oncology following her diagnosis of colon adenocarcinoma, but Tonya Dougherty was not able to make it to our office.  Tonya Dougherty does state that Tonya Dougherty has a vehicle and that Tonya Dougherty drives herself. Medical oncology was asked see the Tonya Dougherty make recommendations regarding her metastatic cancer.    Past Medical History:  Diagnosis Date  . Allergic rhinitis   . Anxiety   . Arthritis   . Bell's palsy   . Cataract   . Cirrhosis ([place])   .  Coarse tremors   . Colon cancer ([place])   . Diabetes mellitus without complication ([place])   . Diverticulosis   . DVT (deep venous thrombosis) ([place])   . GERD (gastroesophageal reflux disease)   . GI bleed   . Hepatitis B   . History of blood transfusion 05/2019   . History of panic attacks   . Hypertension   . Insomnia   . Iron deficiency anemia   . MDD (major depressive disorder)   . Morbid obesity ([place])   . Neuropathy   . Portal vein thrombosis   . Psoriasis   . Renal insufficiency   :  Past Surgical History:  Procedure Laterality Date  . ANKLE SURGERY Right   . CATARACT EXTRACTION, BILATERAL    . COLONOSCOPY WITH ESOPHAGOGASTRODUODENOSCOPY (EGD)  05/2019  . FLEXIBLE SIGMOIDOSCOPY N/A 08/14/2019   Procedure: FLEXIBLE SIGMOIDOSCOPY;  Surgeon: Ileana Roup, MD;  Location: [facility];  Service: General;  Laterality: N/A;  :  Current Facility-Administered Medications  Medication Dose Route Frequency Provider Last Rate Last Admin  . acetaminophen (TYLENOL) tablet 650 mg  650 mg Oral Q6H PRN Vernelle Emerald, MD   650 mg at 03/29/20 0001   Or  . acetaminophen (TYLENOL) suppository 650 mg  650 mg Rectal Q6H PRN Shalhoub, Sherryll Burger, MD      . apixaban (ELIQUIS) tablet 5 mg  5 mg Oral BID Wendee Beavers T, MD   5 mg at 03/31/20 1023  . carvedilol (COREG) tablet 3.125 mg  3.125 mg Oral BID WC Shalhoub, Sherryll Burger, MD   3.125 mg at 03/31/20 1022  . citalopram (CELEXA) tablet 30 mg  30 mg Oral QHS Wendee Beavers T, MD   30 mg at 03/30/20 2042  . diphenhydrAMINE (BENADRYL) capsule 25 mg  25 mg Oral Q8H PRN Wendee Beavers T, MD   25 mg at 03/27/20 1743  . feeding supplement (ENSURE ENLIVE / ENSURE PLUS) liquid 237 mL  237 mL Oral BID BM Shalhoub, Sherryll Burger, MD   237 mL at 03/30/20 1558  . ferrous sulfate tablet 325 mg  325 mg Oral Q breakfast Wendee Beavers T, MD   325 mg at 03/31/20 1022  . fluticasone (FLONASE) 50 MCG/ACT nasal spray 1-2 spray  1-2 spray Each Nare Daily PRN Shalhoub, Sherryll Burger, MD      . gabapentin (NEURONTIN) capsule 600 mg  600 mg Oral TID Wendee Beavers T, MD   600 mg at 03/31/20 1022  . HYDROmorphone (DILAUDID) injection 1 mg  1 mg Intravenous Q3H PRN Arrien, Jimmy Picket, MD      . insulin aspart (novoLOG) injection 0-15 Units  0-15 Units  Subcutaneous TID AC & HS Shalhoub, Sherryll Burger, MD   2 Units at 03/31/20 1203  . insulin glargine (LANTUS) injection 40 Units  40 Units Subcutaneous Q2200 Vernelle Emerald, MD   40 Units at 03/30/20 2044  . lip balm (CARMEX) ointment   Topical PRN Arrien, Jimmy Picket, MD      . multivitamin with minerals tablet 1 tablet  1 tablet Oral Daily Wendee Beavers T, MD   1 tablet at 03/31/20 1022  . nystatin (MYCOSTATIN/NYSTOP) topical powder   Topical BID Vernelle Emerald, MD   Given at 03/31/20 1029  . ondansetron (ZOFRAN) tablet 4 mg  4 mg Oral Q6H PRN Shalhoub, Sherryll Burger, MD       Or  . ondansetron [facility]) injection 4 mg  4 mg Intravenous Q6H PRN Shalhoub, Sherryll Burger, MD      .  oxyCODONE (OXYCONTIN) 12 hr tablet 10 mg  10 mg Oral Q12H Arrien, Jimmy Picket, MD   10 mg at 03/31/20 1210  . oxyCODONE-acetaminophen (PERCOCET/ROXICET) 5-325 MG per tablet 1 tablet  1 tablet Oral Q4H PRN Mercy Riding, MD   1 tablet at 03/31/20 1027  . pantoprazole (PROTONIX) EC tablet 40 mg  40 mg Oral Daily Shalhoub, Sherryll Burger, MD   40 mg at 03/31/20 1022  . polyethylene glycol (MIRALAX / GLYCOLAX) packet 17 g  17 g Oral Daily PRN Vernelle Emerald, MD   17 g at 03/29/20 0916  . polyethylene glycol (MIRALAX / GLYCOLAX) packet 17 g  17 g Oral BID Arrien, Jimmy Picket, MD      . polyvinyl alcohol (LIQUIFILM TEARS) 1.4 % ophthalmic solution 1 drop  1 drop Both Eyes TID PRN Shalhoub, Sherryll Burger, MD      . senna-docusate (Senokot-S) tablet 1 tablet  1 tablet Oral BID PRN Mercy Riding, MD      . sodium phosphate (FLEET) 7-19 GM/118ML enema 1 enema  1 enema Rectal Once Arrien, Jimmy Picket, MD      . Zinc Oxide (TRIPLE PASTE) 12.8 % ointment   Topical BID Shalhoub, Sherryll Burger, MD   1 application at 86/75/44 1029     Allergies  Allergen Reactions  . Xarelto [Rivaroxaban] Rash    Severe rash with itching  . Erythromycin Other (See Comments)    Oral Thrush  . Lipitor [Atorvastatin] Other (See Comments)    Leg cramps  .  Pravachol [Pravastatin] Other (See Comments)    Leg muscle cramps  . Ivp Dye [Iodinated Diagnostic Agents] Rash  :  Family History  Problem Relation Age of Onset  . Kidney failure Mother   . Diabetes Mother   . Obesity Mother   . Hypertension Mother   . Cirrhosis Father   . Hypertension Sister   . Kidney failure Brother   . Hypertension Brother   . Hypertension Sister   . Alcoholism Brother   . Other Brother        MRSA  . Cirrhosis Daughter   . Drug abuse Daughter   :  Social History   Socioeconomic History  . Marital status: Divorced    Spouse name: Not on file  . Number of children: 2  . Years of education: Not on file  . Highest education level: Not on file  Occupational History  . Occupation: retired  Tobacco Use  . Smoking status: Current Every Day Smoker    Packs/day: 0.50    Types: Cigarettes  . Smokeless tobacco: Never Used  Vaping Use  . Vaping Use: Some days  . Substances: Nicotine, Flavoring  Substance and Sexual Activity  . Alcohol use: No  . Drug use: Not Currently  . Sexual activity: Not on file  Other Topics Concern  . Not on file  Social History Narrative  . Not on file   Social Determinants of Health   Financial Resource Strain:   . Difficulty of Paying Living Expenses: Not on file  Food Insecurity:   . Worried About Charity fundraiser in the Last Year: Not on file  . Ran Out of Food in the Last Year: Not on file  Transportation Needs:   . Lack of Transportation (Medical): Not on file  . Lack of Transportation (Non-Medical): Not on file  Physical Activity:   . Days of Exercise per Week: Not on file  . Minutes of Exercise per Session:  Not on file  Stress:   . Feeling of Stress : Not on file  Social Connections:   . Frequency of Communication with Friends and Family: Not on file  . Frequency of Social Gatherings with Friends and Family: Not on file  . Attends Religious Services: Not on file  . Active Member of Clubs or  Organizations: Not on file  . Attends Archivist Meetings: Not on file  . Marital Status: Not on file  Intimate Partner Violence:   . Fear of Current or Ex-Partner: Not on file  . Emotionally Abused: Not on file  . Physically Abused: Not on file  . Sexually Abused: Not on file  :  Review of Systems: A comprehensive 14 point review of systems was negative except as noted in the HPI.  Exam: Tonya Dougherty Vitals for the past 24 hrs:  BP Temp Temp src Pulse Resp SpO2  03/31/20 1022 125/62 -- -- 83 -- --  03/31/20 0805 125/62 98 F (36.7 C) Oral 83 16 90 %  03/31/20 0346 (!) 105/43 98.2 F (36.8 C) Oral 78 16 91 %  03/30/20 2025 (!) 106/48 98.3 F (36.8 C) Oral 76 16 99 %  03/30/20 1513 (!) 105/42 98.2 F (36.8 C) Oral 74 14 94 %    General: Awake alert, no distress. Eyes:  no scleral icterus.   ENT:  There were no oropharyngeal lesions.   Lymphatics:  Negative cervical, supraclavicular or axillary adenopathy.   Respiratory: lungs were clear bilaterally without wheezing or crackles.   Cardiovascular:  Regular rate and rhythm, S1/S2, without murmur, rub or gallop.  There was no pedal edema.   GI: Obese, positive bowel sounds, soft, nontender, unable to palpate organomegaly. Musculoskeletal: Strength symmetrical in the upper and lower extremities.  Pain with palpation over the left sacrum. Skin: Ruddy color over the bilateral shins Neuro exam was nonfocal. Tonya Dougherty was alert and oriented.  Attention was good.   Language was appropriate.  Mood was normal without depression.  Speech was not pressured.  Thought content was not tangential.     Lab Results  Component Value Date   WBC 6.9 03/31/2020   HGB 13.3 03/31/2020   HCT 43.3 03/31/2020   PLT 204 03/31/2020   GLUCOSE 127 (H) 03/25/2020   ALT 14 03/25/2020   AST 19 03/25/2020   NA 140 03/25/2020   K 3.9 03/25/2020   CL 104 03/25/2020   CREATININE 0.96 03/25/2020   BUN 22 03/25/2020   CO2 23 03/25/2020    MR THORACIC  SPINE WO CONTRAST  Result Date: 03/25/2020 CLINICAL DATA:  Initial evaluation for low back pain, cauda equina syndrome suspected. EXAM: MRI THORACIC AND LUMBAR SPINE WITHOUT CONTRAST TECHNIQUE: Multiplanar and multiecho pulse sequences of the thoracic and lumbar spine were obtained without intravenous contrast. COMPARISON:  Prior CT from 06/05/2019 FINDINGS: MRI THORACIC SPINE FINDINGS Alignment:  Examination degraded by motion artifact. Vertebral bodies normally aligned with preservation of the normal thoracic kyphosis. No listhesis or static subluxation. Vertebrae: Vertebral body height maintained without acute or chronic fracture. Bone marrow signal intensity mildly heterogeneous without worrisome osseous lesion. No evidence for metastatic disease within the thoracic spine. Subcentimeter benign hemangioma noted within the T10 vertebral body. Cord:  Normal signal and morphology on this motion degraded exam. Paraspinal and other soft tissues: Paraspinous soft tissues demonstrate no acute finding. Probable scattered atelectatic changes noted within the partially visualized lungs. Visualized visceral structures otherwise grossly unremarkable. Disc levels: Normal expected for age multilevel disc  desiccation with mild annular disc bulging, most notable at T4-5, T10-11, and T11-12. No significant spinal stenosis or evidence for cord deformity or impingement. Foramina remain patent. No neural impingement. MRI LUMBAR SPINE FINDINGS Segmentation:  Examination moderately degraded by motion artifact. Standard segmentation. Lowest well-formed disc space labeled the L5-S1 level. Alignment: Levoscoliosis. Trace 2 mm retrolisthesis of L3 on L4, with trace 2 mm anterolisthesis of L4 on L5, chronic and facet mediated. Alignment otherwise normal with preservation of the normal lumbar lordosis. Vertebrae: There is an abnormal T2/STIR hyperintense lesion involving the central and left aspect of the sacrum, concerning for osseous  metastatic disease (series 2, image 9). This appears to be new as compared to most recent available CT from 06/05/2019. Probable early invasion of the adjacent left S1 and possibly S2 neural foramina, partially visualized (series 5, image 26). Possible early invasion of the right S1 neural foramen noted as well. Probable extraosseous extension into the adjacent presacral space noted on sagittal view (series 2, image 13). No visible pathologic fracture. No other worrisome osseous lesions or evidence for metastatic disease seen within the lumbar spine. Underlying bone marrow signal intensity somewhat diffusely heterogeneous. Vertebral body height maintained without acute or chronic fracture. No other abnormal marrow edema. Conus medullaris and cauda equina: Conus extends to the L1-2 level. Conus and cauda equina appear normal. Paraspinal and other soft tissues: Paraspinous soft tissues demonstrate no acute finding on this motion degraded exam. There is question of a heterogeneous lesion within the interpolar right kidney measuring 2.0 x 1.8 cm (series 4, image 5), not well evaluated on this motion degraded and noncontrast examination. No lesion seen at this location on prior CT. Remainder of the visualized visceral structures otherwise unremarkable. Disc levels: L1-2: Diffuse disc bulge with disc desiccation and intervertebral disc space narrowing. Superimposed central to left subarticular disc protrusion indents the ventral thecal sac (series 4, image 7). Resultant mild to moderate spinal stenosis without frank cord impingement. Foramina remain patent. L2-3: Mild diffuse disc bulge with disc desiccation. Disc bulge eccentric to the right. Mild bilateral facet hypertrophy. Resultant mild canal with right subarticular stenosis. Foramina remain patent. L3-4: Mild diffuse disc bulge with disc desiccation. Disc bulge eccentric to the left. Moderate bilateral facet hypertrophy. Resultant moderate canal with left lateral  recess stenosis. Mild left L3 foraminal narrowing. Right neural foramina remains patent. L4-5: Trace anterolisthesis. Diffuse disc bulge with disc desiccation. Disc bulge asymmetric to the left. Superimposed small central to left foraminal disc extrusion with superior migration (series 18, image 8). Moderate bilateral facet and ligament flavum hypertrophy. Associated small joint effusion on the left. Resultant severe canal with bilateral subarticular stenosis. Thecal sac measures 5 mm in AP diameter at its most narrow point. Moderate left L4 foraminal stenosis. No significant right foraminal narrowing. L5-S1: Degenerative intervertebral disc space narrowing with diffuse disc bulge and disc desiccation. Reactive endplate change with marginal endplate osteophytic spurring. Broad posterior disc osteophyte contacts the descending S1 nerve roots as they course through the lateral recesses bilaterally (series 4, image 23). Mild facet hypertrophy. Resultant mild to moderate bilateral lateral recess narrowing. Central canal remains patent. Mild right worse than left L5 foraminal stenosis. IMPRESSION: MR THORACIC SPINE IMPRESSION: 1. Motion degraded exam. 2. No acute abnormality within the thoracic spine. No evidence for significant disc pathology, stenosis, or evidence for neural impingement. No metastatic disease. MR LUMBAR SPINE IMPRESSION: 1. Abnormal T2/STIR hyperintense lesion involving the central and left aspect of the sacrum, concerning for osseous metastatic disease. Probable  early invasion of the adjacent left S1 and S2 neural foramina, with extraosseous extension into the adjacent presacral space. No visible pathologic fracture. Finding could be further assessed with dedicated MRI of the sacrum/pelvis as clinically warranted. 2. No other metastatic disease within the lumbar spine. 3. Multifactorial degenerative changes at L4-5 with resultant severe canal and bilateral subarticular stenosis, with moderate left L4  foraminal narrowing. 4. Left eccentric disc bulge and facet hypertrophy at L3-4 with resultant mild to moderate canal and left lateral recess stenosis. 5. Central to left subarticular disc protrusion at L1-2 with resultant mild to moderate spinal stenosis. Electronically Signed   By: Jeannine Boga M.D.   On: 03/25/2020 01:13   MR LUMBAR SPINE WO CONTRAST  Result Date: 03/25/2020 CLINICAL DATA:  Initial evaluation for low back pain, cauda equina syndrome suspected. EXAM: MRI THORACIC AND LUMBAR SPINE WITHOUT CONTRAST TECHNIQUE: Multiplanar and multiecho pulse sequences of the thoracic and lumbar spine were obtained without intravenous contrast. COMPARISON:  Prior CT from 06/05/2019 FINDINGS: MRI THORACIC SPINE FINDINGS Alignment:  Examination degraded by motion artifact. Vertebral bodies normally aligned with preservation of the normal thoracic kyphosis. No listhesis or static subluxation. Vertebrae: Vertebral body height maintained without acute or chronic fracture. Bone marrow signal intensity mildly heterogeneous without worrisome osseous lesion. No evidence for metastatic disease within the thoracic spine. Subcentimeter benign hemangioma noted within the T10 vertebral body. Cord:  Normal signal and morphology on this motion degraded exam. Paraspinal and other soft tissues: Paraspinous soft tissues demonstrate no acute finding. Probable scattered atelectatic changes noted within the partially visualized lungs. Visualized visceral structures otherwise grossly unremarkable. Disc levels: Normal expected for age multilevel disc desiccation with mild annular disc bulging, most notable at T4-5, T10-11, and T11-12. No significant spinal stenosis or evidence for cord deformity or impingement. Foramina remain patent. No neural impingement. MRI LUMBAR SPINE FINDINGS Segmentation:  Examination moderately degraded by motion artifact. Standard segmentation. Lowest well-formed disc space labeled the L5-S1 level.  Alignment: Levoscoliosis. Trace 2 mm retrolisthesis of L3 on L4, with trace 2 mm anterolisthesis of L4 on L5, chronic and facet mediated. Alignment otherwise normal with preservation of the normal lumbar lordosis. Vertebrae: There is an abnormal T2/STIR hyperintense lesion involving the central and left aspect of the sacrum, concerning for osseous metastatic disease (series 2, image 9). This appears to be new as compared to most recent available CT from 06/05/2019. Probable early invasion of the adjacent left S1 and possibly S2 neural foramina, partially visualized (series 5, image 26). Possible early invasion of the right S1 neural foramen noted as well. Probable extraosseous extension into the adjacent presacral space noted on sagittal view (series 2, image 13). No visible pathologic fracture. No other worrisome osseous lesions or evidence for metastatic disease seen within the lumbar spine. Underlying bone marrow signal intensity somewhat diffusely heterogeneous. Vertebral body height maintained without acute or chronic fracture. No other abnormal marrow edema. Conus medullaris and cauda equina: Conus extends to the L1-2 level. Conus and cauda equina appear normal. Paraspinal and other soft tissues: Paraspinous soft tissues demonstrate no acute finding on this motion degraded exam. There is question of a heterogeneous lesion within the interpolar right kidney measuring 2.0 x 1.8 cm (series 4, image 5), not well evaluated on this motion degraded and noncontrast examination. No lesion seen at this location on prior CT. Remainder of the visualized visceral structures otherwise unremarkable. Disc levels: L1-2: Diffuse disc bulge with disc desiccation and intervertebral disc space narrowing. Superimposed central to left  subarticular disc protrusion indents the ventral thecal sac (series 4, image 7). Resultant mild to moderate spinal stenosis without frank cord impingement. Foramina remain patent. L2-3: Mild diffuse  disc bulge with disc desiccation. Disc bulge eccentric to the right. Mild bilateral facet hypertrophy. Resultant mild canal with right subarticular stenosis. Foramina remain patent. L3-4: Mild diffuse disc bulge with disc desiccation. Disc bulge eccentric to the left. Moderate bilateral facet hypertrophy. Resultant moderate canal with left lateral recess stenosis. Mild left L3 foraminal narrowing. Right neural foramina remains patent. L4-5: Trace anterolisthesis. Diffuse disc bulge with disc desiccation. Disc bulge asymmetric to the left. Superimposed small central to left foraminal disc extrusion with superior migration (series 18, image 8). Moderate bilateral facet and ligament flavum hypertrophy. Associated small joint effusion on the left. Resultant severe canal with bilateral subarticular stenosis. Thecal sac measures 5 mm in AP diameter at its most narrow point. Moderate left L4 foraminal stenosis. No significant right foraminal narrowing. L5-S1: Degenerative intervertebral disc space narrowing with diffuse disc bulge and disc desiccation. Reactive endplate change with marginal endplate osteophytic spurring. Broad posterior disc osteophyte contacts the descending S1 nerve roots as they course through the lateral recesses bilaterally (series 4, image 23). Mild facet hypertrophy. Resultant mild to moderate bilateral lateral recess narrowing. Central canal remains patent. Mild right worse than left L5 foraminal stenosis. IMPRESSION: MR THORACIC SPINE IMPRESSION: 1. Motion degraded exam. 2. No acute abnormality within the thoracic spine. No evidence for significant disc pathology, stenosis, or evidence for neural impingement. No metastatic disease. MR LUMBAR SPINE IMPRESSION: 1. Abnormal T2/STIR hyperintense lesion involving the central and left aspect of the sacrum, concerning for osseous metastatic disease. Probable early invasion of the adjacent left S1 and S2 neural foramina, with extraosseous extension into  the adjacent presacral space. No visible pathologic fracture. Finding could be further assessed with dedicated MRI of the sacrum/pelvis as clinically warranted. 2. No other metastatic disease within the lumbar spine. 3. 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 4. Left eccentric disc bulge and facet hypertrophy at L3-4 with resultant mild to moderate canal and left lateral recess stenosis. 5. Central to left subarticular disc protrusion at L1-2 with resultant mild to moderate spinal stenosis. Electronically Signed   By: Jeannine Boga M.D.   On: 03/25/2020 01:13   CT CHEST ABDOMEN PELVIS W CONTRAST  Result Date: 03/25/2020 CLINICAL DATA:  Left sacral lesion on MR. History of sigmoid colon cancer. EXAM: CT CHEST, ABDOMEN, AND PELVIS WITH CONTRAST TECHNIQUE: Multidetector CT imaging of the chest, abdomen and pelvis was performed following the standard protocol during bolus administration of intravenous contrast. CONTRAST:  191m OMNIPAQUE IOHEXOL 300 MG/ML  SOLN COMPARISON:  CT chest abdomen pelvis dated 06/05/2019. MRI thoracolumbar spine dated 03/25/2020. FINDINGS: CT CHEST FINDINGS Cardiovascular: Heart is normal in size.  No pericardial effusion. No evidence of thoracic aortic aneurysm. Mediastinum/Nodes: No suspicious mediastinal lymphadenopathy. Visualized thyroid is enlarged/heterogeneous. Lungs/Pleura: Trace right pleural effusion. No suspicious pulmonary nodules. Mild scarring/fibrosis in the right upper lobe. No pleural effusion or pneumothorax. Musculoskeletal: No focal osseous lesions. CT ABDOMEN PELVIS FINDINGS Hepatobiliary: Cirrhosis.  No suspicious/enhancing hepatic lesions. Numerous layering gallstones (series 4/image 64), without associated inflammatory changes. Pancreas: Within normal limits. Spleen: Enlarged, measuring 15.3 cm in maximal craniocaudal dimension. Adrenals/Urinary  Tract: Adrenal glands are within normal limits. 2.6 cm hypoenhancing mass in the right upper kidney (series 4/image 65), essentially new. This appearance may reflect infection, but if  neoplastic, would favor metastasis over renal cell carcinoma. Left kidney is within normal limits.  No hydronephrosis. Bladder is within normal limits. Stomach/Bowel: Stomach is within normal limits. No evidence of bowel obstruction. Appendix is not discretely visualized. Status post left hemicolectomy with suture line in the lower abdomen (series 4/image 95). Vascular/Lymphatic: No evidence of abdominal aortic aneurysm. Atherosclerotic calcifications of the abdominal aorta and branch vessels. No suspicious abdominopelvic lymphadenopathy. Reproductive: Uterus is within normal limits. Left ovary is within normal limits. Right ovary is notable for a 3.2 cm cyst/follicle, previously 3.5 cm. Other: No abdominopelvic ascites. Musculoskeletal: Destructive left sacral lesion (series 4/image 100), better evaluated on recent MRI. Associated 2.8 x 3.5 cm soft tissue component anterior to the left sacrum (series 4/image 105), previously 2.1 x 2.6 cm. IMPRESSION: Status post left hemicolectomy. Destructive left sacral lesion, better evaluated on recent MRI. Associated 2.8 x 3.5 cm soft tissue component anterior to the left sacrum, mildly increased. This appearance remains worrisome for metastasis. 2.6 cm hypoenhancing mass in the right upper kidney, essentially new. This appearance may reflect infection, but if neoplastic, would favor metastasis over renal cell carcinoma. Trace right pleural effusion. Cirrhosis. Splenomegaly. No abdominopelvic ascites. Cholelithiasis, without associated inflammatory changes. Electronically Signed   By: Julian Hy M.D.   On: 03/25/2020 13:35   CT BIOPSY  Result Date: 03/28/2020 INDICATION: History of colon cancer, enlarging sacral lesion concerning for metastatic disease EXAM: CT core BIOPSY LEFT SACRAL  MASS MEDICATIONS: 1% LIDOCAINE LOCAL ANESTHESIA/SEDATION: Moderate (conscious) sedation was employed during this procedure. A total of Versed 1.5 mg and Fentanyl 50 mcg was administered intravenously. Moderate Sedation Time: 15 minutes. The Tonya Dougherty's level of consciousness and vital signs were monitored continuously by radiology nursing throughout the procedure under my direct supervision. FLUOROSCOPY TIME:  Fluoroscopy Time: NONE. COMPLICATIONS: None immediate. PROCEDURE: Informed written consent was obtained from the Tonya Dougherty after a thorough discussion of the procedural risks, benefits and alternatives. All questions were addressed. Maximal Sterile Barrier Technique was utilized including caps, mask, sterile gowns, sterile gloves, sterile drape, hand hygiene and skin antiseptic. A timeout was performed prior to the initiation of the procedure. previous imaging reviewed. Tonya Dougherty positioned prone. noncontrast localization CT performed. the destructive left sacral soft tissue mass was localized and marked for a posterior approach. Under sterile conditions and local anesthesia, a 17 gauge coaxial guide was advanced to the left sacral lesion soft tissue component. Needle position confirmed with CT. Several 18 gauge core biopsies attempted. Sampling was fragmented and placed in formalin. Needle removed. Postprocedure imaging demonstrates no hemorrhage or hematoma. Tonya Dougherty tolerated biopsy well. IMPRESSION: Successful CT-guided left sacral mass 18 gauge core biopsy Electronically Signed   By: Jerilynn Mages.  Shick M.D.   On: 03/28/2020 16:11   DG Knee Complete 4 Views Left  Result Date: 03/24/2020 CLINICAL DATA:  Pain EXAM: LEFT KNEE - COMPLETE 4+ VIEW COMPARISON:  None. FINDINGS: There are moderate tricompartmental degenerative changes, greatest within the medial compartment. There is no significant joint effusion. No acute displaced fracture or dislocation. IMPRESSION: Moderate tricompartmental degenerative changes, greatest  within the medial compartment. Electronically Signed   By: Constance Holster M.D.   On: 03/24/2020 23:35   DG Hip Unilat W or Wo Pelvis 2-3 Views Left  Result Date: 03/24/2020 CLINICAL DATA:  Pain status post fall EXAM: DG HIP (WITH OR WITHOUT PELVIS) 2-3V LEFT COMPARISON:  None. FINDINGS: There is mild bilateral hip osteoarthritis. There is no acute displaced fracture or dislocation. There are calcifications projecting over the Tonya Dougherty's  pelvis that are favored to represent phleboliths. IMPRESSION: Mild bilateral hip osteoarthritis. No acute displaced fracture or dislocation. Electronically Signed   By: Constance Holster M.D.   On: 03/24/2020 23:32     MR THORACIC SPINE WO CONTRAST  Result Date: 03/25/2020 CLINICAL DATA:  Initial evaluation for low back pain, cauda equina syndrome suspected. EXAM: MRI THORACIC AND LUMBAR SPINE WITHOUT CONTRAST TECHNIQUE: Multiplanar and multiecho pulse sequences of the thoracic and lumbar spine were obtained without intravenous contrast. COMPARISON:  Prior CT from 06/05/2019 FINDINGS: MRI THORACIC SPINE FINDINGS Alignment:  Examination degraded by motion artifact. Vertebral bodies normally aligned with preservation of the normal thoracic kyphosis. No listhesis or static subluxation. Vertebrae: Vertebral body height maintained without acute or chronic fracture. Bone marrow signal intensity mildly heterogeneous without worrisome osseous lesion. No evidence for metastatic disease within the thoracic spine. Subcentimeter benign hemangioma noted within the T10 vertebral body. Cord:  Normal signal and morphology on this motion degraded exam. Paraspinal and other soft tissues: Paraspinous soft tissues demonstrate no acute finding. Probable scattered atelectatic changes noted within the partially visualized lungs. Visualized visceral structures otherwise grossly unremarkable. Disc levels: Normal expected for age multilevel disc desiccation with mild annular disc bulging, most  notable at T4-5, T10-11, and T11-12. No significant spinal stenosis or evidence for cord deformity or impingement. Foramina remain patent. No neural impingement. MRI LUMBAR SPINE FINDINGS Segmentation:  Examination moderately degraded by motion artifact. Standard segmentation. Lowest well-formed disc space labeled the L5-S1 level. Alignment: Levoscoliosis. Trace 2 mm retrolisthesis of L3 on L4, with trace 2 mm anterolisthesis of L4 on L5, chronic and facet mediated. Alignment otherwise normal with preservation of the normal lumbar lordosis. Vertebrae: There is an abnormal T2/STIR hyperintense lesion involving the central and left aspect of the sacrum, concerning for osseous metastatic disease (series 2, image 9). This appears to be new as compared to most recent available CT from 06/05/2019. Probable early invasion of the adjacent left S1 and possibly S2 neural foramina, partially visualized (series 5, image 26). Possible early invasion of the right S1 neural foramen noted as well. Probable extraosseous extension into the adjacent presacral space noted on sagittal view (series 2, image 13). No visible pathologic fracture. No other worrisome osseous lesions or evidence for metastatic disease seen within the lumbar spine. Underlying bone marrow signal intensity somewhat diffusely heterogeneous. Vertebral body height maintained without acute or chronic fracture. No other abnormal marrow edema. Conus medullaris and cauda equina: Conus extends to the L1-2 level. Conus and cauda equina appear normal. Paraspinal and other soft tissues: Paraspinous soft tissues demonstrate no acute finding on this motion degraded exam. There is question of a heterogeneous lesion within the interpolar right kidney measuring 2.0 x 1.8 cm (series 4, image 5), not well evaluated on this motion degraded and noncontrast examination. No lesion seen at this location on prior CT. Remainder of the visualized visceral structures otherwise  unremarkable. Disc levels: L1-2: Diffuse disc bulge with disc desiccation and intervertebral disc space narrowing. Superimposed central to left subarticular disc protrusion indents the ventral thecal sac (series 4, image 7). Resultant mild to moderate spinal stenosis without frank cord impingement. Foramina remain patent. L2-3: Mild diffuse disc bulge with disc desiccation. Disc bulge eccentric to the right. Mild bilateral facet hypertrophy. Resultant mild canal with right subarticular stenosis. Foramina remain patent. L3-4: Mild diffuse disc bulge with disc desiccation. Disc bulge eccentric to the left. Moderate bilateral facet hypertrophy. Resultant moderate canal with left lateral recess stenosis. Mild left L3 foraminal  narrowing. Right neural foramina remains patent. L4-5: Trace anterolisthesis. Diffuse disc bulge with disc desiccation. Disc bulge asymmetric to the left. Superimposed small central to left foraminal disc extrusion with superior migration (series 18, image 8). Moderate bilateral facet and ligament flavum hypertrophy. Associated small joint effusion on the left. Resultant severe canal with bilateral subarticular stenosis. Thecal sac measures 5 mm in AP diameter at its most narrow point. Moderate left L4 foraminal stenosis. No significant right foraminal narrowing. L5-S1: Degenerative intervertebral disc space narrowing with diffuse disc bulge and disc desiccation. Reactive endplate change with marginal endplate osteophytic spurring. Broad posterior disc osteophyte contacts the descending S1 nerve roots as they course through the lateral recesses bilaterally (series 4, image 23). Mild facet hypertrophy. Resultant mild to moderate bilateral lateral recess narrowing. Central canal remains patent. Mild right worse than left L5 foraminal stenosis. IMPRESSION: MR THORACIC SPINE IMPRESSION: 1. Motion degraded exam. 2. No acute abnormality within the thoracic spine. No evidence for significant disc  pathology, stenosis, or evidence for neural impingement. No metastatic disease. MR LUMBAR SPINE IMPRESSION: 1. Abnormal T2/STIR hyperintense lesion involving the central and left aspect of the sacrum, concerning for osseous metastatic disease. Probable early invasion of the adjacent left S1 and S2 neural foramina, with extraosseous extension into the adjacent presacral space. No visible pathologic fracture. Finding could be further assessed with dedicated MRI of the sacrum/pelvis as clinically warranted. 2. No other metastatic disease within the lumbar spine. 3. 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 4. Left eccentric disc bulge and facet hypertrophy at L3-4 with resultant mild to moderate canal and left lateral recess stenosis. 5. Central to left subarticular disc protrusion at L1-2 with resultant mild to moderate spinal stenosis. Electronically Signed   By: Jeannine Boga M.D.   On: 03/25/2020 01:13   MR LUMBAR SPINE WO CONTRAST  Result Date: 03/25/2020 CLINICAL DATA:  Initial evaluation for low back pain, cauda equina syndrome suspected. EXAM: MRI THORACIC AND LUMBAR SPINE WITHOUT CONTRAST TECHNIQUE: Multiplanar and multiecho pulse sequences of the thoracic and lumbar spine were obtained without intravenous contrast. COMPARISON:  Prior CT from 06/05/2019 FINDINGS: MRI THORACIC SPINE FINDINGS Alignment:  Examination degraded by motion artifact. Vertebral bodies normally aligned with preservation of the normal thoracic kyphosis. No listhesis or static subluxation. Vertebrae: Vertebral body height maintained without acute or chronic fracture. Bone marrow signal intensity mildly heterogeneous without worrisome osseous lesion. No evidence for metastatic disease within the thoracic spine. Subcentimeter benign hemangioma noted within the T10 vertebral body. Cord:  Normal signal and morphology on  this motion degraded exam. Paraspinal and other soft tissues: Paraspinous soft tissues demonstrate no acute finding. Probable scattered atelectatic changes noted within the partially visualized lungs. Visualized visceral structures otherwise grossly unremarkable. Disc levels: Normal expected for age multilevel disc desiccation with mild annular disc bulging, most notable at T4-5, T10-11, and T11-12. No significant spinal stenosis or evidence for cord deformity or impingement. Foramina remain patent. No neural impingement. MRI LUMBAR SPINE FINDINGS Segmentation:  Examination moderately degraded by motion artifact. Standard segmentation. Lowest well-formed disc space labeled the L5-S1 level. Alignment: Levoscoliosis. Trace 2 mm retrolisthesis of L3 on L4, with trace 2 mm anterolisthesis of L4 on L5, chronic and facet mediated. Alignment otherwise normal with preservation of the normal lumbar lordosis. Vertebrae: There is an abnormal T2/STIR hyperintense lesion involving the central and left aspect of the sacrum, concerning for osseous metastatic disease (series 2, image 9).  This appears to be new as compared to most recent available CT from 06/05/2019. Probable early invasion of the adjacent left S1 and possibly S2 neural foramina, partially visualized (series 5, image 26). Possible early invasion of the right S1 neural foramen noted as well. Probable extraosseous extension into the adjacent presacral space noted on sagittal view (series 2, image 13). No visible pathologic fracture. No other worrisome osseous lesions or evidence for metastatic disease seen within the lumbar spine. Underlying bone marrow signal intensity somewhat diffusely heterogeneous. Vertebral body height maintained without acute or chronic fracture. No other abnormal marrow edema. Conus medullaris and cauda equina: Conus extends to the L1-2 level. Conus and cauda equina appear normal. Paraspinal and other soft tissues: Paraspinous soft tissues  demonstrate no acute finding on this motion degraded exam. There is question of a heterogeneous lesion within the interpolar right kidney measuring 2.0 x 1.8 cm (series 4, image 5), not well evaluated on this motion degraded and noncontrast examination. No lesion seen at this location on prior CT. Remainder of the visualized visceral structures otherwise unremarkable. Disc levels: L1-2: Diffuse disc bulge with disc desiccation and intervertebral disc space narrowing. Superimposed central to left subarticular disc protrusion indents the ventral thecal sac (series 4, image 7). Resultant mild to moderate spinal stenosis without frank cord impingement. Foramina remain patent. L2-3: Mild diffuse disc bulge with disc desiccation. Disc bulge eccentric to the right. Mild bilateral facet hypertrophy. Resultant mild canal with right subarticular stenosis. Foramina remain patent. L3-4: Mild diffuse disc bulge with disc desiccation. Disc bulge eccentric to the left. Moderate bilateral facet hypertrophy. Resultant moderate canal with left lateral recess stenosis. Mild left L3 foraminal narrowing. Right neural foramina remains patent. L4-5: Trace anterolisthesis. Diffuse disc bulge with disc desiccation. Disc bulge asymmetric to the left. Superimposed small central to left foraminal disc extrusion with superior migration (series 18, image 8). Moderate bilateral facet and ligament flavum hypertrophy. Associated small joint effusion on the left. Resultant severe canal with bilateral subarticular stenosis. Thecal sac measures 5 mm in AP diameter at its most narrow point. Moderate left L4 foraminal stenosis. No significant right foraminal narrowing. L5-S1: Degenerative intervertebral disc space narrowing with diffuse disc bulge and disc desiccation. Reactive endplate change with marginal endplate osteophytic spurring. Broad posterior disc osteophyte contacts the descending S1 nerve roots as they course through the lateral recesses  bilaterally (series 4, image 23). Mild facet hypertrophy. Resultant mild to moderate bilateral lateral recess narrowing. Central canal remains patent. Mild right worse than left L5 foraminal stenosis. IMPRESSION: MR THORACIC SPINE IMPRESSION: 1. Motion degraded exam. 2. No acute abnormality within the thoracic spine. No evidence for significant disc pathology, stenosis, or evidence for neural impingement. No metastatic disease. MR LUMBAR SPINE IMPRESSION: 1. Abnormal T2/STIR hyperintense lesion involving the central and left aspect of the sacrum, concerning for osseous metastatic disease. Probable early invasion of the adjacent left S1 and S2 neural foramina, with extraosseous extension into the adjacent presacral space. No visible pathologic fracture. Finding could be further assessed with dedicated MRI of the sacrum/pelvis as clinically warranted. 2. No other metastatic disease within the lumbar spine. 3. 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 4. Left eccentric disc bulge and facet hypertrophy at L3-4 with resultant mild to moderate canal and left lateral recess stenosis. 5. Central to left subarticular disc protrusion at L1-2 with resultant mild to moderate spinal stenosis. Electronically Signed   By: Jeannine Boga  M.D.   On: 03/25/2020 01:13   CT CHEST ABDOMEN PELVIS W CONTRAST  Result Date: 03/25/2020 CLINICAL DATA:  Left sacral lesion on MR. History of sigmoid colon cancer. EXAM: CT CHEST, ABDOMEN, AND PELVIS WITH CONTRAST TECHNIQUE: Multidetector CT imaging of the chest, abdomen and pelvis was performed following the standard protocol during bolus administration of intravenous contrast. CONTRAST:  142m OMNIPAQUE IOHEXOL 300 MG/ML  SOLN COMPARISON:  CT chest abdomen pelvis dated 06/05/2019. MRI thoracolumbar spine dated 03/25/2020. FINDINGS: CT CHEST FINDINGS Cardiovascular: Heart is  normal in size.  No pericardial effusion. No evidence of thoracic aortic aneurysm. Mediastinum/Nodes: No suspicious mediastinal lymphadenopathy. Visualized thyroid is enlarged/heterogeneous. Lungs/Pleura: Trace right pleural effusion. No suspicious pulmonary nodules. Mild scarring/fibrosis in the right upper lobe. No pleural effusion or pneumothorax. Musculoskeletal: No focal osseous lesions. CT ABDOMEN PELVIS FINDINGS Hepatobiliary: Cirrhosis.  No suspicious/enhancing hepatic lesions. Numerous layering gallstones (series 4/image 64), without associated inflammatory changes. Pancreas: Within normal limits. Spleen: Enlarged, measuring 15.3 cm in maximal craniocaudal dimension. Adrenals/Urinary Tract: Adrenal glands are within normal limits. 2.6 cm hypoenhancing mass in the right upper kidney (series 4/image 65), essentially new. This appearance may reflect infection, but if neoplastic, would favor metastasis over renal cell carcinoma. Left kidney is within normal limits.  No hydronephrosis. Bladder is within normal limits. Stomach/Bowel: Stomach is within normal limits. No evidence of bowel obstruction. Appendix is not discretely visualized. Status post left hemicolectomy with suture line in the lower abdomen (series 4/image 95). Vascular/Lymphatic: No evidence of abdominal aortic aneurysm. Atherosclerotic calcifications of the abdominal aorta and branch vessels. No suspicious abdominopelvic lymphadenopathy. Reproductive: Uterus is within normal limits. Left ovary is within normal limits. Right ovary is notable for a 3.2 cm cyst/follicle, previously 3.5 cm. Other: No abdominopelvic ascites. Musculoskeletal: Destructive left sacral lesion (series 4/image 100), better evaluated on recent MRI. Associated 2.8 x 3.5 cm soft tissue component anterior to the left sacrum (series 4/image 105), previously 2.1 x 2.6 cm. IMPRESSION: Status post left hemicolectomy. Destructive left sacral lesion, better evaluated on recent MRI.  Associated 2.8 x 3.5 cm soft tissue component anterior to the left sacrum, mildly increased. This appearance remains worrisome for metastasis. 2.6 cm hypoenhancing mass in the right upper kidney, essentially new. This appearance may reflect infection, but if neoplastic, would favor metastasis over renal cell carcinoma. Trace right pleural effusion. Cirrhosis. Splenomegaly. No abdominopelvic ascites. Cholelithiasis, without associated inflammatory changes. Electronically Signed   By: SJulian HyM.D.   On: 03/25/2020 13:35   CT BIOPSY  Result Date: 03/28/2020 INDICATION: History of colon cancer, enlarging sacral lesion concerning for metastatic disease EXAM: CT core BIOPSY LEFT SACRAL MASS MEDICATIONS: 1% LIDOCAINE LOCAL ANESTHESIA/SEDATION: Moderate (conscious) sedation was employed during this procedure. A total of Versed 1.5 mg and Fentanyl 50 mcg was administered intravenously. Moderate Sedation Time: 15 minutes. The Tonya Dougherty's level of consciousness and vital signs were monitored continuously by radiology nursing throughout the procedure under my direct supervision. FLUOROSCOPY TIME:  Fluoroscopy Time: NONE. COMPLICATIONS: None immediate. PROCEDURE: Informed written consent was obtained from the Tonya Dougherty after a thorough discussion of the procedural risks, benefits and alternatives. All questions were addressed. Maximal Sterile Barrier Technique was utilized including caps, mask, sterile gowns, sterile gloves, sterile drape, hand hygiene and skin antiseptic. A timeout was performed prior to the initiation of the procedure. previous imaging reviewed. Tonya Dougherty positioned prone. noncontrast localization CT performed. the destructive left sacral soft tissue mass was localized and marked for a posterior approach. Under sterile conditions and local anesthesia,  a 17 gauge coaxial guide was advanced to the left sacral lesion soft tissue component. Needle position confirmed with CT. Several 18 gauge core biopsies  attempted. Sampling was fragmented and placed in formalin. Needle removed. Postprocedure imaging demonstrates no hemorrhage or hematoma. Tonya Dougherty tolerated biopsy well. IMPRESSION: Successful CT-guided left sacral mass 18 gauge core biopsy Electronically Signed   By: Jerilynn Mages.  Shick M.D.   On: 03/28/2020 16:11   DG Knee Complete 4 Views Left  Result Date: 03/24/2020 CLINICAL DATA:  Pain EXAM: LEFT KNEE - COMPLETE 4+ VIEW COMPARISON:  None. FINDINGS: There are moderate tricompartmental degenerative changes, greatest within the medial compartment. There is no significant joint effusion. No acute displaced fracture or dislocation. IMPRESSION: Moderate tricompartmental degenerative changes, greatest within the medial compartment. Electronically Signed   By: Constance Holster M.D.   On: 03/24/2020 23:35   DG Hip Unilat W or Wo Pelvis 2-3 Views Left  Result Date: 03/24/2020 CLINICAL DATA:  Pain status post fall EXAM: DG HIP (WITH OR WITHOUT PELVIS) 2-3V LEFT COMPARISON:  None. FINDINGS: There is mild bilateral hip osteoarthritis. There is no acute displaced fracture or dislocation. There are calcifications projecting over the Tonya Dougherty's pelvis that are favored to represent phleboliths. IMPRESSION: Mild bilateral hip osteoarthritis. No acute displaced fracture or dislocation. Electronically Signed   By: Constance Holster M.D.   On: 03/24/2020 23:32   Assessment and Plan:  1.  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, status post sigmoidectomy 08/14/2019 - MSI Stable   Left sacral mass consistent with colorectal primary -MRI of the thoracic and lumbar spine 03/24/2020 -abnormal T2/STIR hyperintense lesion involving the central and left aspect of the sacrum concerning for osseous metastatic disease, probable invasion of adjacent left S1-S2 neural foramina, extraosseous extension into the adjacent presacral space -CT chest/abdomen/pelvis 03/25/2020-destructive left  sacral lesion, associated 2.8 x 3.5 cm soft tissue component anterior to the left sacrum worrisome for metastasis, 2.6 cm hypoenhancing mass in the right upper kidney-?  Metastasis versus renal cell carcinoma -CT-guided biopsy of left sacral mass 03/28/2020-metastatic adenocarcinoma consistent with GI primary 2.  History of iron deficiency anemia 3.  Cirrhosis with splenomegaly 5.  Left lower extremity DVT diagnosed 2019 6.  Hypertension 7.  Diabetes mellitus 8.  Benign essential tremor 9.  CKD 10.  Hyperlipidemia 11.  Morbid obesity 12.  Anxiety and depression 13.  Tobacco dependence 14.  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  Tonya Dougherty is status post sigmoidectomy.  Tonya Dougherty now presents with progressive pain in her left sacrum/buttocks.  Tonya Dougherty was found to have a destructive sacral lesion.  Biopsy consistent with adenocarcinoma from colorectal primary.  Discussed biopsy results with Tonya Dougherty.  Pain moderately well controlled with current pain medications.  Tonya Dougherty is due to be seen by radiation oncology later today for consideration of palliative radiation to the left sacral mass.  The Tonya Dougherty will be seen by Dr. Benay Spice to discuss systemic treatment options for her metastatic colon cancer.  We may consider obtaining a CEA inpatient versus outpatient.  Recommendations: 1.  Recommend consultation with radiation oncology for consideration of palliative radiation to her left sacral mass. 2.  Outpatient follow-up will be scheduled in medical oncology near the completion of radiation 3.  Check CEA 4.  Please call oncology as needed  Thank you for this referral.   Mikey Bussing, DNP, AGPCNP-BC, AOCNP  Ms.  Arrambide has been diagnosed with metastatic colon cancer.  Tonya Dougherty appears to have metastatic disease involving the sacrum and a right renal mass.  Tonya Dougherty is symptomatic with pain at the sacrum.  Tonya Dougherty is  beginning a course of palliative radiation today.  I discussed the diagnosis with Ms. Megan Salon.  We will consider systemic treatment options at the completion of radiation.  Tonya Dougherty has multiple comorbid conditions which may limit treatment options.  We will submit the sacrum biopsy for Foundation 1 testing.

## 2020-03-31 NOTE — TOC Progression Note (Signed)
Transition of Care [facility]) - Progression Note    Patient Details  Name: Tonya Dougherty MRN: [id number] Date of Birth: 11/16/1950  Transition of Care [facility]) CM/SW [place], RN Phone Number: 03/31/2020, 2:54 PM  Clinical Narrative:    Case management reached out to [facility] and [facility] to look at the patient's clinicals for admission.  The patient is not clear medically for discharge, but considering the patient's insurance - authorization might take time for approval and placement.  Will continue to follow the patient for possible discharge to [facility] facility.   Expected Discharge Plan: [place] Barriers to Discharge: Continued Medical Work up  Expected Discharge Plan and Services Expected Discharge Plan: [place]   Discharge Planning Services: CM Consult Post Acute Care Choice: [place] Living arrangements for the past 2 months: Single Family Home                                       Social Determinants of Health (SDOH) Interventions    Readmission Risk Interventions Readmission Risk Prevention Plan 03/30/2020  Transportation Screening Complete  PCP or Specialist Appt within 5-7 Days Complete  Home Care Screening Complete  Medication Review (RN CM) Complete  Some recent data might be hidden

## 2020-03-31 NOTE — Plan of Care (Signed)

## 2020-03-31 NOTE — Progress Notes (Addendum)
PROGRESS NOTE    Tonya Dougherty  JKD:[id number] DOB: 09/11/1950 DOA: 03/24/2020 PCP: Gay Filler, MD    Brief Narrative:  Tonya Dougherty was admitted to the hospital with a working diagnosis of ambulatory dysfunction due to metastatic adenocarcinoma (gastrointesitnal primary) at the sacrum on the left with invasion of S1 and S2 neural foramina.  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  Presents with progressive weakness and left buttock/thigh pain. Patient had a subacute left hip/buttock pain, that was refractory to outpatient management.  Over the last 10 days prior to hospitalization she developed difficulty ambulating and progressive weakness in the left lower extremity.  On her initial physical examination blood pressure 136/56, heart rate 87, respirate 15, oxygen saturation 95%, her lungs were clear to auscultation bilaterally, heart S1-S2, present rhythmic, soft abdomen and no lower extremity edema.  Thoracic lumbar MRI positive for hyperintense lesions involving the central and left aspect of the sacrum, concerning for osseous metastatic disease.  Probably early invasion of the adjacent left S1-S2 neural foramina. Positive degenerative disc disease,  Further work-up with CT chest, abdomen pelvis showed destructive left sacral lesion with associated 2.8 x 3.5 cm soft tissue component anterior to the left sacrum worrisome for metastasis.  2.6 cm hypoenhancing mass in the right upper kidney.  CT-guided biopsies 11/29 of sacral lesion showing metastatic adenocarcinoma consistent with gastrointestinal primary.    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 Metastatic adenocarcinoma of intestinal origin at the left sacrum/ ambulatory dysfunction.  Biopsy positive for adenocarcinoma of intestinal origin.   Old records personally reviewed, patient had a XI robot assisted sigmoidectomy on 07/2019 per Dr Dema Severin at [facility], pathology was positive for invasive adenocarcinoma well differentiated, surgical margins were negative for carcinoma and no evidence of carcinoma in 11 of 11 lymph nodes.   Will consult oncology for bony metastatic disease to the sacrum.   Patient with persistent pain despite as needed hydromorphone and oxycodone, will add long acting oxycodone and continue close monitoring. Aggressive bowel regime with miralax and fleet enema.   2. Acute cystitis. Patient has completed antibiotic therapy.   3. HTN. Continue with carvedilol, for now will hold on furosemide/ spironolactone, patient with poor oral intake.   4. T2DM. capillary glucose this am 122, continue sliding scale and basal insulin with glargine 40 units.   5. Obesity class 3/ stage 2 sacrum pressure ucler (present on admission). BMI is 45.12. Continue local skin care.   6. Anxiety and depression. Continue with citalopram.   7. Hx of DVT.On apixaban for anticoagulation   8. Iron deficiency anemia. Continue with ferrous sulfate.   Status is: Inpatient  Remains inpatient appropriate because:Inpatient level of care appropriate due to severity of illness   Dispo: The patient is from: Home              Anticipated d/c is to:  SNF              Anticipated d/c date is: 3 days              Patient currently is not medically  stable to d/c.   DVT prophylaxis: apixaban   Code Status:   full  Family Communication:  I spoke over the phone with the patient's sister about patient's  condition, plan of care, prognosis and all questions were addressed.    Nutrition Status: Nutrition Problem: Increased nutrient needs Etiology: cancer and cancer related treatments Signs/Symptoms: estimated needs Interventions: Ensure Enlive (each supplement provides 350kcal and 20 grams of protein), MVI     Skin Documentation: Pressure Injury 03/25/20 Sacrum Right;Left Stage 2 -  Partial thickness loss of dermis presenting as a shallow open injury with a red, pink wound bed without slough. (Active)  03/25/20 0430  Location: Sacrum  Location Orientation: Right;Left  Staging: Stage 2 -  Partial thickness loss of dermis presenting as a shallow open injury with a red, pink wound bed without slough.  Wound Description (Comments):   Present on Admission: Yes     Consultants:   Oncology   Radiation Oncology   Procedures:   Bone biopsy       Subjective: Patient continue to have pain at the left hip and left leg, worse with movement and no completely improved with analgesics. Positive constipation, no bowel movement over last 3 days.   Objective: Vitals:   03/30/20 2025 03/31/20 0346 03/31/20 0805 03/31/20 1022  BP: (!) 106/48 (!) 105/43 125/62 125/62  Pulse: 76 78 83 83  Resp: 16 16 16    Temp: 98.3 F (36.8 C) 98.2 F (36.8 C) 98 F (36.7 C)   TempSrc: Oral Oral Oral   SpO2: 99% 91% 90%   Weight:      Height:        Intake/Output Summary (Last 24 hours) at 03/31/2020 1051 Last data filed at 03/30/2020 1239 Gross per 24 hour  Intake --  Output 700 ml  Net -700 ml   Filed Weights   03/24/20 2140  Weight: 111.9 kg    Examination:   General: Not in pain or dyspnea, deconditioned and in pain.  Neurology: Awake and alert, non focal  E ENT: mild pallor, no icterus, oral mucosa moist Cardiovascular: No  JVD. S1-S2 present, rhythmic, no gallops, rubs, or murmurs. No lower extremity edema. Pulmonary: positive breath sounds bilaterally, adequate air movement, no wheezing, rhonchi or rales. Gastrointestinal. Abdomen soft and non tender Skin. No rashes Musculoskeletal: no joint deformities     Data Reviewed: I have personally reviewed following labs and imaging studies  CBC: Recent Labs  Lab 03/24/20 2200 03/24/20 2200 03/25/20 0508 03/26/20 0925 03/27/20 0244 03/28/20 0437 03/29/20 0505 03/30/20 0138 03/31/20 0247  WBC 8.4   < > 8.3   < > 17.8* 9.6 8.4 7.0 6.9  NEUTROABS 5.6  --  6.1  --   --   --   --   --   --   HGB 13.0   < > 13.4   < > 13.6 13.4 12.5 12.4 13.3  HCT 42.4   < > 44.5   < > 44.6 43.2 40.5 39.3 43.3  MCV 94.6   < > 94.7   < > 94.5 93.7 92.7 92.9 93.7  PLT 183   < > 170   < > 227 214 214 207 204   < > = values in this interval not displayed.  Basic Metabolic Panel: Recent Labs  Lab 03/24/20 2200 03/25/20 0508  NA 140 140  K 4.1 3.9  CL 106 104  CO2 22 23  GLUCOSE 171* 127*  BUN 27* 22  CREATININE 1.01* 0.96  CALCIUM 9.2 9.2  MG  --  2.1   GFR: Estimated Creatinine Clearance: 65.3 mL/min (by C-G formula based on SCr of 0.96 mg/dL). Liver Function Tests: Recent Labs  Lab 03/24/20 2200 03/25/20 0508  AST 23 19  ALT 14 14  ALKPHOS 130* 123  BILITOT 0.8 1.3*  PROT 6.9 7.0  ALBUMIN 2.9* 2.8*   No results for input(s): LIPASE, AMYLASE in the last 168 hours. No results for input(s): AMMONIA in the last 168 hours. Coagulation Profile: No results for input(s): INR, PROTIME in the last 168 hours. Cardiac Enzymes: No results for input(s): CKTOTAL, CKMB, CKMBINDEX, TROPONINI in the last 168 hours. BNP (last 3 results) No results for input(s): PROBNP in the last 8760 hours. HbA1C: No results for input(s): HGBA1C in the last 72 hours. CBG: Recent Labs  Lab 03/30/20 0738 03/30/20 1114 03/30/20 1600 03/30/20 2023 03/31/20 0642  GLUCAP 110*  155* 177* 172* 122*   Lipid Profile: No results for input(s): CHOL, HDL, LDLCALC, TRIG, CHOLHDL, LDLDIRECT in the last 72 hours. Thyroid Function Tests: No results for input(s): TSH, T4TOTAL, FREET4, T3FREE, THYROIDAB in the last 72 hours. Anemia Panel: No results for input(s): VITAMINB12, FOLATE, FERRITIN, TIBC, IRON, RETICCTPCT in the last 72 hours.    Radiology Studies: I have reviewed all of the imaging during this hospital visit personally     Scheduled Meds: . apixaban  5 mg Oral BID  . carvedilol  3.125 mg Oral BID WC  . citalopram  30 mg Oral QHS  . feeding supplement  237 mL Oral BID BM  . ferrous sulfate  325 mg Oral Q breakfast  . furosemide  20 mg Oral BID  . gabapentin  600 mg Oral TID  . insulin aspart  0-15 Units Subcutaneous TID AC & HS  . insulin glargine  40 Units Subcutaneous Q2200  . multivitamin with minerals  1 tablet Oral Daily  . nystatin   Topical BID  . pantoprazole  40 mg Oral Daily  . spironolactone  50 mg Oral Daily  . Zinc Oxide   Topical BID   Continuous Infusions:   LOS: 5 days        Tonya Arreaga Gerome Apley, MD

## 2020-03-31 NOTE — Consult Note (Signed)
Radiation Oncology         [phone] ________________________________  Initial inpatient Consultation  Name: Tonya Dougherty MRN: [id number]  Date of Service: 03/24/2020 DOB: October 21, 1950  VX:[id number], Darrel Reach, MD  Dr. Riccardo Dubin Arrien  REFERRING PHYSICIAN: Dr. Sander Radon  DIAGNOSIS: 69 y.o. 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    ICD-10-CM   1. Malignant neoplasm (HCC)  C80.1 CT CHEST ABDOMEN PELVIS W CONTRAST    CT CHEST ABDOMEN PELVIS W CONTRAST  2. Sacral lesion  M53.3 CT BIOPSY    CT BIOPSY    CANCELED: IR Radiologist Eval & Mgmt    CANCELED: IR Radiologist Eval & Mgmt    HISTORY OF PRESENT ILLNESS: Tonya Dougherty is a 69 y.o. female seen at the request of Dr. Cathlean Sauer.  She has a history of pT3N0 adenocarcinoma of the sigmoid colon which was resected in April 2021 but then was lost to follow-up.  She recently presented to the hospital on 03/24/2020 with progressive weakness, fatigue, left buttock pain into the left lower leg and new onset bowel and bladder incontinence.  She reports that the pain in the left buttock and leg have been present since the summer but progressively worsening.  More recently, she also developed decreased appetite, weight loss and intermittent nausea.  An MRI of the thoracic and lumbar spine performed on admission revealed a 2.8 x 3.5 cm lytic lesion in the left sacrum, new since previous imaging in February 2021 and invading into the left S1 and possible S2 neural foramina as well as the right S1 neural foramina and probable extraosseous extension into the presacral space, suspicious for metastatic disease.  There was no evidence of pathologic fracture or other concerning lesions and no evidence of cord compression.  A CT C/A/P was also performed for further work-up and did not show any pulmonary nodules or lymphadenopathy in the chest and no lymphadenopathy in the abdomen or pelvis.  There was a  destructive sacral lesion and suspicious right renal lesion.  The renal lesion was felt to possibly be associated with infection/inflammation, poor, if neoplastic, favored metastasis over renal cell carcinoma.  She underwent a CT-guided biopsy of the left sacral mass on 03/28/2020 with final surgical pathology confirming metastatic adenocarcinoma consistent with a GI primary.  We have been asked to consult the patient today for consideration of palliative radiotherapy to the sacral lesion for better pain control and to preserve neural function.  PREVIOUS RADIATION THERAPY: No  PAST MEDICAL HISTORY:  Past Medical History:  Diagnosis Date  . Allergic rhinitis   . Anxiety   . Arthritis   . Bell's palsy   . Cataract   . Cirrhosis ([place])   . Coarse tremors   . Colon cancer ([place])   . Diabetes mellitus without complication ([place])   . Diverticulosis   . DVT (deep venous thrombosis) ([place])   . GERD (gastroesophageal reflux disease)   . GI bleed   . Hepatitis B   . History of blood transfusion 05/2019  . History of panic attacks   . Hypertension   . Insomnia   . Iron deficiency anemia   . MDD (major depressive disorder)   . Morbid obesity ([place])   . Neuropathy   . Portal vein thrombosis   . Psoriasis   . Renal insufficiency       PAST SURGICAL HISTORY: Past Surgical History:  Procedure Laterality Date  . ANKLE SURGERY Right   .  CATARACT EXTRACTION, BILATERAL    . COLONOSCOPY WITH ESOPHAGOGASTRODUODENOSCOPY (EGD)  05/2019  . FLEXIBLE SIGMOIDOSCOPY N/A 08/14/2019   Procedure: FLEXIBLE SIGMOIDOSCOPY;  Surgeon: Ileana Roup, MD;  Location: [facility];  Service: General;  Laterality: N/A;    FAMILY HISTORY:  Family History  Problem Relation Age of Onset  . Kidney failure Mother   . Diabetes Mother   . Obesity Mother   . Hypertension Mother   . Cirrhosis Father   . Hypertension Sister   . Kidney failure Brother   . Hypertension Brother   . Hypertension Sister   . Alcoholism  Brother   . Other Brother        MRSA  . Cirrhosis Daughter   . Drug abuse Daughter     SOCIAL HISTORY:  Social History   Socioeconomic History  . Marital status: Divorced    Spouse name: Not on file  . Number of children: 2  . Years of education: Not on file  . Highest education level: Not on file  Occupational History  . Occupation: retired  Tobacco Use  . Smoking status: Current Every Day Smoker    Packs/day: 0.50    Types: Cigarettes  . Smokeless tobacco: Never Used  Vaping Use  . Vaping Use: Some days  . Substances: Nicotine, Flavoring  Substance and Sexual Activity  . Alcohol use: No  . Drug use: Not Currently  . Sexual activity: Not on file  Other Topics Concern  . Not on file  Social History Narrative  . Not on file   Social Determinants of Health   Financial Resource Strain:   . Difficulty of Paying Living Expenses: Not on file  Food Insecurity:   . Worried About Charity fundraiser in the Last Year: Not on file  . Ran Out of Food in the Last Year: Not on file  Transportation Needs:   . Lack of Transportation (Medical): Not on file  . Lack of Transportation (Non-Medical): Not on file  Physical Activity:   . Days of Exercise per Week: Not on file  . Minutes of Exercise per Session: Not on file  Stress:   . Feeling of Stress : Not on file  Social Connections:   . Frequency of Communication with Friends and Family: Not on file  . Frequency of Social Gatherings with Friends and Family: Not on file  . Attends Religious Services: Not on file  . Active Member of Clubs or Organizations: Not on file  . Attends Archivist Meetings: Not on file  . Marital Status: Not on file  Intimate Partner Violence:   . Fear of Current or Ex-Partner: Not on file  . Emotionally Abused: Not on file  . Physically Abused: Not on file  . Sexually Abused: Not on file    ALLERGIES: Xarelto [rivaroxaban], Erythromycin, Lipitor [atorvastatin], Pravachol [pravastatin],  and Ivp dye [iodinated diagnostic agents]  MEDICATIONS:  Current Facility-Administered Medications  Medication Dose Route Frequency Provider Last Rate Last Admin  . acetaminophen (TYLENOL) tablet 650 mg  650 mg Oral Q6H PRN Vernelle Emerald, MD   650 mg at 03/29/20 0001   Or  . acetaminophen (TYLENOL) suppository 650 mg  650 mg Rectal Q6H PRN Shalhoub, Sherryll Burger, MD      . apixaban (ELIQUIS) tablet 5 mg  5 mg Oral BID Wendee Beavers T, MD   5 mg at 03/31/20 1023  . carvedilol (COREG) tablet 3.125 mg  3.125 mg Oral BID WC Shalhoub, Sherryll Burger,  MD   3.125 mg at 03/31/20 1558  . citalopram (CELEXA) tablet 30 mg  30 mg Oral QHS Wendee Beavers T, MD   30 mg at 03/30/20 2042  . diphenhydrAMINE (BENADRYL) capsule 25 mg  25 mg Oral Q8H PRN Wendee Beavers T, MD   25 mg at 03/27/20 1743  . feeding supplement (ENSURE ENLIVE / ENSURE PLUS) liquid 237 mL  237 mL Oral BID BM Shalhoub, Sherryll Burger, MD   237 mL at 03/31/20 1552  . ferrous sulfate tablet 325 mg  325 mg Oral Q breakfast Wendee Beavers T, MD   325 mg at 03/31/20 1022  . fluticasone (FLONASE) 50 MCG/ACT nasal spray 1-2 spray  1-2 spray Each Nare Daily PRN Shalhoub, Sherryll Burger, MD      . gabapentin (NEURONTIN) capsule 600 mg  600 mg Oral TID Wendee Beavers T, MD   600 mg at 03/31/20 1552  . HYDROmorphone (DILAUDID) injection 1 mg  1 mg Intravenous Q3H PRN Arrien, Jimmy Picket, MD      . insulin aspart (novoLOG) injection 0-15 Units  0-15 Units Subcutaneous TID AC & HS Shalhoub, Sherryll Burger, MD   2 Units at 03/31/20 1203  . insulin glargine (LANTUS) injection 40 Units  40 Units Subcutaneous Q2200 Vernelle Emerald, MD   40 Units at 03/30/20 2044  . lip balm (CARMEX) ointment   Topical PRN Arrien, Jimmy Picket, MD      . multivitamin with minerals tablet 1 tablet  1 tablet Oral Daily Wendee Beavers T, MD   1 tablet at 03/31/20 1022  . nystatin (MYCOSTATIN/NYSTOP) topical powder   Topical BID Vernelle Emerald, MD   Given at 03/31/20 1029  . ondansetron (ZOFRAN) tablet  4 mg  4 mg Oral Q6H PRN Shalhoub, Sherryll Burger, MD       Or  . ondansetron [facility]) injection 4 mg  4 mg Intravenous Q6H PRN Shalhoub, Sherryll Burger, MD      . oxyCODONE (OXYCONTIN) 12 hr tablet 10 mg  10 mg Oral Q12H Arrien, Jimmy Picket, MD   10 mg at 03/31/20 1210  . oxyCODONE-acetaminophen (PERCOCET/ROXICET) 5-325 MG per tablet 1 tablet  1 tablet Oral Q4H PRN Mercy Riding, MD   1 tablet at 03/31/20 1027  . pantoprazole (PROTONIX) EC tablet 40 mg  40 mg Oral Daily Shalhoub, Sherryll Burger, MD   40 mg at 03/31/20 1022  . polyethylene glycol (MIRALAX / GLYCOLAX) packet 17 g  17 g Oral Daily PRN Vernelle Emerald, MD   17 g at 03/29/20 0916  . polyethylene glycol (MIRALAX / GLYCOLAX) packet 17 g  17 g Oral BID Arrien, Jimmy Picket, MD      . polyvinyl alcohol (LIQUIFILM TEARS) 1.4 % ophthalmic solution 1 drop  1 drop Both Eyes TID PRN Shalhoub, Sherryll Burger, MD      . senna-docusate (Senokot-S) tablet 1 tablet  1 tablet Oral BID PRN Mercy Riding, MD      . sodium phosphate (FLEET) 7-19 GM/118ML enema 1 enema  1 enema Rectal Once Arrien, Jimmy Picket, MD      . Zinc Oxide (TRIPLE PASTE) 12.8 % ointment   Topical BID Shalhoub, Sherryll Burger, MD   1 application at 02/24/24 1029    REVIEW OF SYSTEMS:  On review of systems, the patient reports that she is doing fairly well overall.  She denies any chest pain, shortness of breath, cough, fevers, chills, or night sweats.she reports that the pain in the left  buttock and lower extremity are much improved with pain medication since admission.  She has had unintended weight loss recently but cannot quantify the amount.  She reports new onset bowel and bladder incontinence and saddle paraesthesia over the past week.  She has had some occasional nausea but denies abdominal pain or vomiting.  She denies any other new musculoskeletal or joint aches or pains aside from that in the left buttock and lower leg.  She specifically denies back pain and any recent injury/trauma.  A  complete review of systems is obtained and is otherwise negative.    PHYSICAL EXAM:  Wt Readings from Last 3 Encounters:  03/24/20 246 lb 11.1 oz (111.9 kg)  08/15/19 246 lb 11.1 oz (111.9 kg)  08/06/19 243 lb 8 oz (110.5 kg)   Temp Readings from Last 3 Encounters:  03/31/20 98.2 F (36.8 C) (Oral)  11/25/19 98.4 F (36.9 C) (Oral)  08/16/19 98.3 F (36.8 C) (Oral)   BP Readings from Last 3 Encounters:  03/31/20 (!) 115/56  11/25/19 126/67  08/16/19 (!) 108/54   Pulse Readings from Last 3 Encounters:  03/31/20 80  11/25/19 79  08/16/19 68   Pain Assessment Pain Score: 3 /10  Unable to assess due to telephone consult visit format.   KPS = 70  100 - Normal; no complaints; no evidence of disease. 90   - Able to carry on normal activity; minor signs or symptoms of disease. 80   - Normal activity with effort; some signs or symptoms of disease. 74   - Cares for self; unable to carry on normal activity or to do active work. 60   - Requires occasional assistance, but is able to care for most of his personal needs. 50   - Requires considerable assistance and frequent medical care. 74   - Disabled; requires special care and assistance. 57   - Severely disabled; hospital admission is indicated although death not imminent. 66   - Very sick; hospital admission necessary; active supportive treatment necessary. 10   - Moribund; fatal processes progressing rapidly. 0     - Dead  Karnofsky DA, Abelmann [place], Craver LS and Burchenal [facility] [phone]) The use of the nitrogen mustards in the palliative treatment of carcinoma: with particular reference to bronchogenic carcinoma Cancer 1 634-56  LABORATORY DATA:  Lab Results  Component Value Date   WBC 6.9 03/31/2020   HGB 13.3 03/31/2020   HCT 43.3 03/31/2020   MCV 93.7 03/31/2020   PLT 204 03/31/2020   Lab Results  Component Value Date   NA 140 03/25/2020   K 3.9 03/25/2020   CL 104 03/25/2020   CO2 23 03/25/2020   Lab Results   Component Value Date   ALT 14 03/25/2020   AST 19 03/25/2020   ALKPHOS 123 03/25/2020   BILITOT 1.3 (H) 03/25/2020     RADIOGRAPHY: MR THORACIC SPINE WO CONTRAST  Result Date: 03/25/2020 CLINICAL DATA:  Initial evaluation for low back pain, cauda equina syndrome suspected. EXAM: MRI THORACIC AND LUMBAR SPINE WITHOUT CONTRAST TECHNIQUE: Multiplanar and multiecho pulse sequences of the thoracic and lumbar spine were obtained without intravenous contrast. COMPARISON:  Prior CT from 06/05/2019 FINDINGS: MRI THORACIC SPINE FINDINGS Alignment:  Examination degraded by motion artifact. Vertebral bodies normally aligned with preservation of the normal thoracic kyphosis. No listhesis or static subluxation. Vertebrae: Vertebral body height maintained without acute or chronic fracture. Bone marrow signal intensity mildly heterogeneous without worrisome osseous lesion. No evidence for metastatic disease within  the thoracic spine. Subcentimeter benign hemangioma noted within the T10 vertebral body. Cord:  Normal signal and morphology on this motion degraded exam. Paraspinal and other soft tissues: Paraspinous soft tissues demonstrate no acute finding. Probable scattered atelectatic changes noted within the partially visualized lungs. Visualized visceral structures otherwise grossly unremarkable. Disc levels: Normal expected for age multilevel disc desiccation with mild annular disc bulging, most notable at T4-5, T10-11, and T11-12. No significant spinal stenosis or evidence for cord deformity or impingement. Foramina remain patent. No neural impingement. MRI LUMBAR SPINE FINDINGS Segmentation:  Examination moderately degraded by motion artifact. Standard segmentation. Lowest well-formed disc space labeled the L5-S1 level. Alignment: Levoscoliosis. Trace 2 mm retrolisthesis of L3 on L4, with trace 2 mm anterolisthesis of L4 on L5, chronic and facet mediated. Alignment otherwise normal with preservation of the normal  lumbar lordosis. Vertebrae: There is an abnormal T2/STIR hyperintense lesion involving the central and left aspect of the sacrum, concerning for osseous metastatic disease (series 2, image 9). This appears to be new as compared to most recent available CT from 06/05/2019. Probable early invasion of the adjacent left S1 and possibly S2 neural foramina, partially visualized (series 5, image 26). Possible early invasion of the right S1 neural foramen noted as well. Probable extraosseous extension into the adjacent presacral space noted on sagittal view (series 2, image 13). No visible pathologic fracture. No other worrisome osseous lesions or evidence for metastatic disease seen within the lumbar spine. Underlying bone marrow signal intensity somewhat diffusely heterogeneous. Vertebral body height maintained without acute or chronic fracture. No other abnormal marrow edema. Conus medullaris and cauda equina: Conus extends to the L1-2 level. Conus and cauda equina appear normal. Paraspinal and other soft tissues: Paraspinous soft tissues demonstrate no acute finding on this motion degraded exam. There is question of a heterogeneous lesion within the interpolar right kidney measuring 2.0 x 1.8 cm (series 4, image 5), not well evaluated on this motion degraded and noncontrast examination. No lesion seen at this location on prior CT. Remainder of the visualized visceral structures otherwise unremarkable. Disc levels: L1-2: Diffuse disc bulge with disc desiccation and intervertebral disc space narrowing. Superimposed central to left subarticular disc protrusion indents the ventral thecal sac (series 4, image 7). Resultant mild to moderate spinal stenosis without frank cord impingement. Foramina remain patent. L2-3: Mild diffuse disc bulge with disc desiccation. Disc bulge eccentric to the right. Mild bilateral facet hypertrophy. Resultant mild canal with right subarticular stenosis. Foramina remain patent. L3-4: Mild  diffuse disc bulge with disc desiccation. Disc bulge eccentric to the left. Moderate bilateral facet hypertrophy. Resultant moderate canal with left lateral recess stenosis. Mild left L3 foraminal narrowing. Right neural foramina remains patent. L4-5: Trace anterolisthesis. Diffuse disc bulge with disc desiccation. Disc bulge asymmetric to the left. Superimposed small central to left foraminal disc extrusion with superior migration (series 18, image 8). Moderate bilateral facet and ligament flavum hypertrophy. Associated small joint effusion on the left. Resultant severe canal with bilateral subarticular stenosis. Thecal sac measures 5 mm in AP diameter at its most narrow point. Moderate left L4 foraminal stenosis. No significant right foraminal narrowing. L5-S1: Degenerative intervertebral disc space narrowing with diffuse disc bulge and disc desiccation. Reactive endplate change with marginal endplate osteophytic spurring. Broad posterior disc osteophyte contacts the descending S1 nerve roots as they course through the lateral recesses bilaterally (series 4, image 23). Mild facet hypertrophy. Resultant mild to moderate bilateral lateral recess narrowing. Central canal remains patent. Mild right worse than left  L5 foraminal stenosis. IMPRESSION: MR THORACIC SPINE IMPRESSION: 1. Motion degraded exam. 2. No acute abnormality within the thoracic spine. No evidence for significant disc pathology, stenosis, or evidence for neural impingement. No metastatic disease. MR LUMBAR SPINE IMPRESSION: 1. Abnormal T2/STIR hyperintense lesion involving the central and left aspect of the sacrum, concerning for osseous metastatic disease. Probable early invasion of the adjacent left S1 and S2 neural foramina, with extraosseous extension into the adjacent presacral space. No visible pathologic fracture. Finding could be further assessed with dedicated MRI of the sacrum/pelvis as clinically warranted. 2. No other metastatic disease  within the lumbar spine. 3. 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 4. Left eccentric disc bulge and facet hypertrophy at L3-4 with resultant mild to moderate canal and left lateral recess stenosis. 5. Central to left subarticular disc protrusion at L1-2 with resultant mild to moderate spinal stenosis. Electronically Signed   By: Jeannine Boga M.D.   On: 03/25/2020 01:13   MR LUMBAR SPINE WO CONTRAST  Result Date: 03/25/2020 CLINICAL DATA:  Initial evaluation for low back pain, cauda equina syndrome suspected. EXAM: MRI THORACIC AND LUMBAR SPINE WITHOUT CONTRAST TECHNIQUE: Multiplanar and multiecho pulse sequences of the thoracic and lumbar spine were obtained without intravenous contrast. COMPARISON:  Prior CT from 06/05/2019 FINDINGS: MRI THORACIC SPINE FINDINGS Alignment:  Examination degraded by motion artifact. Vertebral bodies normally aligned with preservation of the normal thoracic kyphosis. No listhesis or static subluxation. Vertebrae: Vertebral body height maintained without acute or chronic fracture. Bone marrow signal intensity mildly heterogeneous without worrisome osseous lesion. No evidence for metastatic disease within the thoracic spine. Subcentimeter benign hemangioma noted within the T10 vertebral body. Cord:  Normal signal and morphology on this motion degraded exam. Paraspinal and other soft tissues: Paraspinous soft tissues demonstrate no acute finding. Probable scattered atelectatic changes noted within the partially visualized lungs. Visualized visceral structures otherwise grossly unremarkable. Disc levels: Normal expected for age multilevel disc desiccation with mild annular disc bulging, most notable at T4-5, T10-11, and T11-12. No significant spinal stenosis or evidence for cord deformity or impingement. Foramina remain patent. No neural impingement. MRI LUMBAR  SPINE FINDINGS Segmentation:  Examination moderately degraded by motion artifact. Standard segmentation. Lowest well-formed disc space labeled the L5-S1 level. Alignment: Levoscoliosis. Trace 2 mm retrolisthesis of L3 on L4, with trace 2 mm anterolisthesis of L4 on L5, chronic and facet mediated. Alignment otherwise normal with preservation of the normal lumbar lordosis. Vertebrae: There is an abnormal T2/STIR hyperintense lesion involving the central and left aspect of the sacrum, concerning for osseous metastatic disease (series 2, image 9). This appears to be new as compared to most recent available CT from 06/05/2019. Probable early invasion of the adjacent left S1 and possibly S2 neural foramina, partially visualized (series 5, image 26). Possible early invasion of the right S1 neural foramen noted as well. Probable extraosseous extension into the adjacent presacral space noted on sagittal view (series 2, image 13). No visible pathologic fracture. No other worrisome osseous lesions or evidence for metastatic disease seen within the lumbar spine. Underlying bone marrow signal intensity somewhat diffusely heterogeneous. Vertebral body height maintained without acute or chronic fracture. No other abnormal marrow edema. Conus medullaris and cauda equina: Conus extends to the L1-2 level. Conus and cauda equina appear normal. Paraspinal and other soft tissues: Paraspinous soft tissues demonstrate no acute finding on this motion degraded exam. There is question of a heterogeneous lesion  within the interpolar right kidney measuring 2.0 x 1.8 cm (series 4, image 5), not well evaluated on this motion degraded and noncontrast examination. No lesion seen at this location on prior CT. Remainder of the visualized visceral structures otherwise unremarkable. Disc levels: L1-2: Diffuse disc bulge with disc desiccation and intervertebral disc space narrowing. Superimposed central to left subarticular disc protrusion indents the  ventral thecal sac (series 4, image 7). Resultant mild to moderate spinal stenosis without frank cord impingement. Foramina remain patent. L2-3: Mild diffuse disc bulge with disc desiccation. Disc bulge eccentric to the right. Mild bilateral facet hypertrophy. Resultant mild canal with right subarticular stenosis. Foramina remain patent. L3-4: Mild diffuse disc bulge with disc desiccation. Disc bulge eccentric to the left. Moderate bilateral facet hypertrophy. Resultant moderate canal with left lateral recess stenosis. Mild left L3 foraminal narrowing. Right neural foramina remains patent. L4-5: Trace anterolisthesis. Diffuse disc bulge with disc desiccation. Disc bulge asymmetric to the left. Superimposed small central to left foraminal disc extrusion with superior migration (series 18, image 8). Moderate bilateral facet and ligament flavum hypertrophy. Associated small joint effusion on the left. Resultant severe canal with bilateral subarticular stenosis. Thecal sac measures 5 mm in AP diameter at its most narrow point. Moderate left L4 foraminal stenosis. No significant right foraminal narrowing. L5-S1: Degenerative intervertebral disc space narrowing with diffuse disc bulge and disc desiccation. Reactive endplate change with marginal endplate osteophytic spurring. Broad posterior disc osteophyte contacts the descending S1 nerve roots as they course through the lateral recesses bilaterally (series 4, image 23). Mild facet hypertrophy. Resultant mild to moderate bilateral lateral recess narrowing. Central canal remains patent. Mild right worse than left L5 foraminal stenosis. IMPRESSION: MR THORACIC SPINE IMPRESSION: 1. Motion degraded exam. 2. No acute abnormality within the thoracic spine. No evidence for significant disc pathology, stenosis, or evidence for neural impingement. No metastatic disease. MR LUMBAR SPINE IMPRESSION: 1. Abnormal T2/STIR hyperintense lesion involving the central and left aspect of the  sacrum, concerning for osseous metastatic disease. Probable early invasion of the adjacent left S1 and S2 neural foramina, with extraosseous extension into the adjacent presacral space. No visible pathologic fracture. Finding could be further assessed with dedicated MRI of the sacrum/pelvis as clinically warranted. 2. No other metastatic disease within the lumbar spine. 3. 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 4. Left eccentric disc bulge and facet hypertrophy at L3-4 with resultant mild to moderate canal and left lateral recess stenosis. 5. Central to left subarticular disc protrusion at L1-2 with resultant mild to moderate spinal stenosis. Electronically Signed   By: Jeannine Boga M.D.   On: 03/25/2020 01:13   CT CHEST ABDOMEN PELVIS W CONTRAST  Result Date: 03/25/2020 CLINICAL DATA:  Left sacral lesion on MR. History of sigmoid colon cancer. EXAM: CT CHEST, ABDOMEN, AND PELVIS WITH CONTRAST TECHNIQUE: Multidetector CT imaging of the chest, abdomen and pelvis was performed following the standard protocol during bolus administration of intravenous contrast. CONTRAST:  141mL OMNIPAQUE IOHEXOL 300 MG/ML  SOLN COMPARISON:  CT chest abdomen pelvis dated 06/05/2019. MRI thoracolumbar spine dated 03/25/2020. FINDINGS: CT CHEST FINDINGS Cardiovascular: Heart is normal in size.  No pericardial effusion. No evidence of thoracic aortic aneurysm. Mediastinum/Nodes: No suspicious mediastinal lymphadenopathy. Visualized thyroid is enlarged/heterogeneous. Lungs/Pleura: Trace right pleural effusion. No suspicious pulmonary nodules. Mild scarring/fibrosis in the right upper lobe. No pleural effusion or pneumothorax. Musculoskeletal: No focal osseous lesions. CT ABDOMEN PELVIS FINDINGS Hepatobiliary:  Cirrhosis.  No suspicious/enhancing hepatic lesions. Numerous layering gallstones (series 4/image 64),  without associated inflammatory changes. Pancreas: Within normal limits. Spleen: Enlarged, measuring 15.3 cm in maximal craniocaudal dimension. Adrenals/Urinary Tract: Adrenal glands are within normal limits. 2.6 cm hypoenhancing mass in the right upper kidney (series 4/image 65), essentially new. This appearance may reflect infection, but if neoplastic, would favor metastasis over renal cell carcinoma. Left kidney is within normal limits.  No hydronephrosis. Bladder is within normal limits. Stomach/Bowel: Stomach is within normal limits. No evidence of bowel obstruction. Appendix is not discretely visualized. Status post left hemicolectomy with suture line in the lower abdomen (series 4/image 95). Vascular/Lymphatic: No evidence of abdominal aortic aneurysm. Atherosclerotic calcifications of the abdominal aorta and branch vessels. No suspicious abdominopelvic lymphadenopathy. Reproductive: Uterus is within normal limits. Left ovary is within normal limits. Right ovary is notable for a 3.2 cm cyst/follicle, previously 3.5 cm. Other: No abdominopelvic ascites. Musculoskeletal: Destructive left sacral lesion (series 4/image 100), better evaluated on recent MRI. Associated 2.8 x 3.5 cm soft tissue component anterior to the left sacrum (series 4/image 105), previously 2.1 x 2.6 cm. IMPRESSION: Status post left hemicolectomy. Destructive left sacral lesion, better evaluated on recent MRI. Associated 2.8 x 3.5 cm soft tissue component anterior to the left sacrum, mildly increased. This appearance remains worrisome for metastasis. 2.6 cm hypoenhancing mass in the right upper kidney, essentially new. This appearance may reflect infection, but if neoplastic, would favor metastasis over renal cell carcinoma. Trace right pleural effusion. Cirrhosis. Splenomegaly. No abdominopelvic ascites. Cholelithiasis, without associated inflammatory changes. Electronically Signed   By: Julian Hy M.D.   On: 03/25/2020 13:35   CT  BIOPSY  Result Date: 03/28/2020 INDICATION: History of colon cancer, enlarging sacral lesion concerning for metastatic disease EXAM: CT core BIOPSY LEFT SACRAL MASS MEDICATIONS: 1% LIDOCAINE LOCAL ANESTHESIA/SEDATION: Moderate (conscious) sedation was employed during this procedure. A total of Versed 1.5 mg and Fentanyl 50 mcg was administered intravenously. Moderate Sedation Time: 15 minutes. The patient's level of consciousness and vital signs were monitored continuously by radiology nursing throughout the procedure under my direct supervision. FLUOROSCOPY TIME:  Fluoroscopy Time: NONE. COMPLICATIONS: None immediate. PROCEDURE: Informed written consent was obtained from the patient after a thorough discussion of the procedural risks, benefits and alternatives. All questions were addressed. Maximal Sterile Barrier Technique was utilized including caps, mask, sterile gowns, sterile gloves, sterile drape, hand hygiene and skin antiseptic. A timeout was performed prior to the initiation of the procedure. previous imaging reviewed. patient positioned prone. noncontrast localization CT performed. the destructive left sacral soft tissue mass was localized and marked for a posterior approach. Under sterile conditions and local anesthesia, a 17 gauge coaxial guide was advanced to the left sacral lesion soft tissue component. Needle position confirmed with CT. Several 18 gauge core biopsies attempted. Sampling was fragmented and placed in formalin. Needle removed. Postprocedure imaging demonstrates no hemorrhage or hematoma. Patient tolerated biopsy well. IMPRESSION: Successful CT-guided left sacral mass 18 gauge core biopsy Electronically Signed   By: Jerilynn Mages.  Shick M.D.   On: 03/28/2020 16:11   DG Knee Complete 4 Views Left  Result Date: 03/24/2020 CLINICAL DATA:  Pain EXAM: LEFT KNEE - COMPLETE 4+ VIEW COMPARISON:  None. FINDINGS: There are moderate tricompartmental degenerative changes, greatest within the medial  compartment. There is no significant joint effusion. No acute displaced fracture or dislocation. IMPRESSION: Moderate tricompartmental degenerative changes, greatest within the medial compartment. Electronically Signed   By: Constance Holster M.D.   On:  03/24/2020 23:35   DG Hip Unilat W or Wo Pelvis 2-3 Views Left  Result Date: 03/24/2020 CLINICAL DATA:  Pain status post fall EXAM: DG HIP (WITH OR WITHOUT PELVIS) 2-3V LEFT COMPARISON:  None. FINDINGS: There is mild bilateral hip osteoarthritis. There is no acute displaced fracture or dislocation. There are calcifications projecting over the patient's pelvis that are favored to represent phleboliths. IMPRESSION: Mild bilateral hip osteoarthritis. No acute displaced fracture or dislocation. Electronically Signed   By: Constance Holster M.D.   On: 03/24/2020 23:32      IMPRESSION/PLAN: 1. 69 y.o. 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 After reviewing the patient's chart and recent imaging, we talked to the patient about the findings and workup thus far. We discussed the natural history of metastatic colon cancer and general treatment, highlighting the role of palliative radiotherapy in the management of painful bony metastases. We discussed the available radiation techniques, and focused on the details and logistics of delivery. He recommendation is for a 2 week course of daily palliative radiotherapy to the painful sacral metastasis. We reviewed the anticipated acute and late sequelae associated with radiation in this setting. The patient was encouraged to ask questions that were answered to her satisfaction.  She has provided verbal consent to proceed today and will sign formal written consent at the time of her CT SIM/treatment planning appointment on 04/01/20. We will proceed with treatment planning on Friday morning in anticipation of starting her treatment Friday afternoon and then  resuming treatments on Monday, 04/04/20. This discussion will be shared with her medical team and we will request a transfer to [facility], if she is deemed medically stable, for ease of initiating radiation treatments. We would like to get a treatment or two in before the patient discharges to SNF or home but the radiation can be continued on an outpatient basis once she is deemed stable for discharge. She is comfortable and in agreement with the stated plan.   In a visit lasting 70 minutes, greater than 50% of that time was spent in floor time, reviewing her records and imaging in preparation for our discussion and on the phone discussing her case and coordinating her care with her medical team.   Nicholos Johns, PA-C    Tyler Pita, MD  [place]: [phone]  Fax: [phone] [email or web address]  Skype  LinkedIn

## 2020-03-31 NOTE — Progress Notes (Signed)
The Charge Nurse, Lazarus Gowda and unit secretary has been made aware of the order for transfer to [facility] - for radiation treatment/ palliative care

## 2020-04-01 ENCOUNTER — Ambulatory Visit
Admit: 2020-04-01 | Discharge: 2020-04-01 | Disposition: A | Discharge status: Home / Self Care | Payer: Medicare HMO | Attending: Radiation Oncology | Admitting: Radiation Oncology

## 2020-04-01 ENCOUNTER — Ambulatory Visit: Payer: Medicare HMO | Admitting: Radiation Oncology

## 2020-04-01 DIAGNOSIS — C187 Malignant neoplasm of sigmoid colon: Secondary | ICD-10-CM | POA: Diagnosis not present

## 2020-04-01 DIAGNOSIS — C7951 Secondary malignant neoplasm of bone: Secondary | ICD-10-CM | POA: Diagnosis not present

## 2020-04-01 DIAGNOSIS — M533 Sacrococcygeal disorders, not elsewhere classified: Secondary | ICD-10-CM

## 2020-04-01 DIAGNOSIS — I10 Essential (primary) hypertension: Secondary | ICD-10-CM | POA: Diagnosis not present

## 2020-04-01 DIAGNOSIS — N3000 Acute cystitis without hematuria: Secondary | ICD-10-CM | POA: Diagnosis not present

## 2020-04-01 LAB — COMPREHENSIVE METABOLIC PANEL
ALT: 12 U/L (ref 0–44)
AST: 28 U/L (ref 15–41)
Albumin: 2.9 g/dL — ABNORMAL LOW (ref 3.5–5.0)
Alkaline Phosphatase: 109 U/L (ref 38–126)
Anion gap: 10 (ref 5–15)
BUN: 19 mg/dL (ref 8–23)
CO2: 33 mmol/L — ABNORMAL HIGH (ref 22–32)
Calcium: 9.5 mg/dL (ref 8.9–10.3)
Chloride: 97 mmol/L — ABNORMAL LOW (ref 98–111)
Creatinine, Ser: 1.04 mg/dL — ABNORMAL HIGH (ref 0.44–1.00)
GFR, Estimated: 58 mL/min — ABNORMAL LOW (ref >60)
Glucose, Bld: 158 mg/dL — ABNORMAL HIGH (ref 70–99)
Potassium: 3.9 mmol/L (ref 3.5–5.1)
Sodium: 140 mmol/L (ref 135–145)
Total Bilirubin: 1.1 mg/dL (ref 0.3–1.2)
Total Protein: 7.2 g/dL (ref 6.5–8.1)

## 2020-04-01 LAB — CBC WITH DIFFERENTIAL/PLATELET
Abs Immature Granulocytes: 0.02 10*3/uL (ref 0.00–0.07)
Basophils Absolute: 0.1 10*3/uL (ref 0.0–0.1)
Basophils Relative: 1 %
Eosinophils Absolute: 0.3 10*3/uL (ref 0.0–0.5)
Eosinophils Relative: 5 %
HCT: 45 % (ref 36.0–46.0)
Hemoglobin: 13.7 g/dL (ref 12.0–15.0)
Immature Granulocytes: 0 %
Lymphocytes Relative: 30 %
Lymphs Abs: 2 10*3/uL (ref 0.7–4.0)
MCH: 29.1 pg (ref 26.0–34.0)
MCHC: 30.4 g/dL (ref 30.0–36.0)
MCV: 95.7 fL (ref 80.0–100.0)
Monocytes Absolute: 0.8 10*3/uL (ref 0.1–1.0)
Monocytes Relative: 13 %
Neutro Abs: 3.4 10*3/uL (ref 1.7–7.7)
Neutrophils Relative %: 51 %
Platelets: 200 10*3/uL (ref 150–400)
RBC: 4.7 MIL/uL (ref 3.87–5.11)
RDW: 19.9 % — ABNORMAL HIGH (ref 11.5–15.5)
WBC: 6.6 10*3/uL (ref 4.0–10.5)
nRBC: 0 % (ref 0.0–0.2)

## 2020-04-01 LAB — PHOSPHORUS: Phosphorus: 3.1 mg/dL (ref 2.5–4.6)

## 2020-04-01 LAB — GLUCOSE, CAPILLARY
Glucose-Capillary: 112 mg/dL — ABNORMAL HIGH (ref 70–99)
Glucose-Capillary: 160 mg/dL — ABNORMAL HIGH (ref 70–99)
Glucose-Capillary: 142 mg/dL — ABNORMAL HIGH (ref 70–99)
Glucose-Capillary: 171 mg/dL — ABNORMAL HIGH (ref 70–99)

## 2020-04-01 LAB — MAGNESIUM: Magnesium: 2 mg/dL (ref 1.7–2.4)

## 2020-04-01 NOTE — Progress Notes (Signed)
Assessed patient at bedside for transfer to [facility]. Patient reports that she has discussed transfer (risks/benefits and indication) with day team. She is in agreement with transfer for further oncology evaluation and recommendations. Patient reports no questions at this time. VSS. Patient AO x 3. Moves all 4 extremities voluntarily. No facial asymmetry. Skin is warm and dry. Patient is stable for transfer.

## 2020-04-01 NOTE — Progress Notes (Signed)
OT Cancellation Note  Patient Details Name: Tonya Dougherty MRN: [id number] DOB: 1951-03-27   Cancelled Treatment:    Reason Eval/Treat Not Completed: Patient declined, no reason specified. Patient reports wanting to take nap "I was up all night." Will check back as schedule permits.  Delbert Phenix OT OT pager: [phone]   Rosemary Holms 04/01/2020, 12:17 PM

## 2020-04-01 NOTE — Progress Notes (Signed)
Physical Therapy Treatment Patient Details Name: Tonya Dougherty MRN: [id number] DOB: [birth year] Today's Date: 04/01/2020    History of Present Illness Pt is a 69 y.o. female with a medical history significant for adenocarcinoma of sigmoid colon s/p resection 08/7260, nonalcoholic fatty liver disease, DM2, HTN, DVT (Eliquis), admitted 03/24/20 with progressive LLE pain radiating from L buttock to foot over the last 3 months that has gotten worse acutely. Imaging showed destructive L sacral lesion found to be metastatic adenocarcinoma.  Pt started on radiation treatments.    PT Comments    Pt making good progress.  She demonstrated improved pain control and was able to ambulate in hall with RW and min A.  Required cues for safety. Continue plan of care.    Follow Up Recommendations  SNF;Supervision/Assistance - 24 hour     Equipment Recommendations  3in1 (PT)    Recommendations for Other Services       Precautions / Restrictions Precautions Precautions: Fall    Mobility  Bed Mobility Overal bed mobility: Needs Assistance Bed Mobility: Supine to Sit   Sidelying to sit: Min guard;HOB elevated       General bed mobility comments: min guard for safety  Transfers Overall transfer level: Needs assistance Equipment used: Rolling walker (2 wheeled) Transfers: Sit to/from Stand Sit to Stand: Min guard         General transfer comment: Cues for safe hand placement; STS performed x 3 throughout session; pt was able to perform toielting ADLs with min guard  Ambulation/Gait Ambulation/Gait assistance: Min assist Gait Distance (Feet): 80 Feet Assistive device: Rolling walker (2 wheeled) Gait Pattern/deviations: Step-to pattern;Decreased stride length;Shuffle;Antalgic Gait velocity: Decreased   General Gait Details: Cues for RW proximity and posture; mild antalgic pattern; required min A for steadying   Stairs             Wheelchair Mobility    Modified Rankin (Stroke  Patients Only)       Balance Overall balance assessment: Needs assistance Sitting-balance support: Feet supported;No upper extremity supported Sitting balance-Leahy Scale: Good     Standing balance support: Bilateral upper extremity supported;No upper extremity supported Standing balance-Leahy Scale: Fair Standing balance comment: RW and min A with gait; able to perform toielting ADLs without UE support                            Cognition Arousal/Alertness: Awake/alert Behavior During Therapy: WFL for tasks assessed/performed Overall Cognitive Status: No family/caregiver present to determine baseline cognitive functioning Area of Impairment: Safety/judgement;Following commands                       Following Commands: Follows multi-step commands inconsistently;Follows one step commands consistently Safety/Judgement: Decreased awareness of safety            Exercises      General Comments General comments (skin integrity, edema, etc.): Pt incontinent with transfers. Pt with resting tremors throughout -reports baseline.  During session pt got up to bsc , returned to EOB, ambulated, then positioned in chair.  When she was returned to bed, the bed rolled as it had not been locked (pt had been transported back from radiation in bed).  PT was able to stop bed and get pt safely seated without difficulty.  RN notified.      Pertinent Vitals/Pain Pain Assessment: Faces Faces Pain Scale: Hurts a little bit Pain Location: L hip Pain Descriptors / Indicators: Discomfort Pain  Intervention(s): Monitored during session;Limited activity within patient's tolerance;Repositioned    Home Living                      Prior Function            PT Goals (current goals can now be found in the care plan section) Acute Rehab PT Goals Patient Stated Goal: to go home PT Goal Formulation: With patient Time For Goal Achievement: 04/08/20 Potential to Achieve  Goals: Fair Progress towards PT goals: Progressing toward goals    Frequency    Min 2X/week      PT Plan Current plan remains appropriate    Co-evaluation              AM-PAC PT "6 Clicks" Mobility   Outcome Measure  Help needed turning from your back to your side while in a flat bed without using bedrails?: A Little Help needed moving from lying on your back to sitting on the side of a flat bed without using bedrails?: A Little Help needed moving to and from a bed to a chair (including a wheelchair)?: A Little Help needed standing up from a chair using your arms (e.g., wheelchair or bedside chair)?: A Little Help needed to walk in hospital room?: A Little Help needed climbing 3-5 steps with a railing? : A Little 6 Click Score: 18    End of Session Equipment Utilized During Treatment: Gait belt Activity Tolerance: Patient tolerated treatment well Patient left: with chair alarm set;in chair;with call bell/phone within reach Nurse Communication: Mobility status PT Visit Diagnosis: Unsteadiness on feet (R26.81);Muscle weakness (generalized) (M62.81);Other abnormalities of gait and mobility (R26.89)     Time: 0370-4888 PT Time Calculation (min) (ACUTE ONLY): 24 min  Charges:  $Gait Training: 8-22 mins $Therapeutic Activity: 8-22 mins                     Abran Richard, PT Acute Rehab Services Pager [phone] [facility] 04/01/2020, 5:18 PM

## 2020-04-01 NOTE — Progress Notes (Addendum)
0300: Pt stable and ready for transfer to [facility]. Report called and given to [facility] along with report given to [facility] transporter.   0400: Belongs gathered and sent with patient. Cell phone and glasses in medicine bag. Patient stable.

## 2020-04-01 NOTE — Progress Notes (Signed)
PROGRESS NOTE    Tonya Dougherty  DZH:[id number] DOB: [birth year] DOA: 03/24/2020 PCP: Gay Filler, MD   Brief Narrative:  Tonya Dougherty admitted to the hospital with a working diagnosis of ambulatory dysfunction due tometastatic adenocarcinoma (gastrointesitnal primary) at thesacrumon the left with invasion of S1 and S2 neural foramina.  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 Presents with progressive weakness and left buttock/thigh pain. Patient had a subacute left hip/buttock pain, that was refractory to outpatient management. Over the last 10 days prior to hospitalization she developed difficulty ambulating and progressive weakness in the left lower extremity. On her initial physical examination blood pressure 136/56, heart rate 87, respirate 15, oxygen saturation 95%, her lungs were clear to auscultation bilaterally, heart S1-S2, present rhythmic, soft abdomen and no lower extremity edema.  Thoracic lumbar MRI positive for hyperintense lesions involving the central and left aspect of the sacrum, concerning for osseous metastatic disease. Probably early invasion of the adjacent left S1-S2 neural foramina. Positive degenerative disc disease,  Further work-up with CT chest, abdomen pelvis showed destructive left sacral lesion with associated 2.8 x 3.5 cm soft tissue component anterior to the left sacrum worrisome for metastasis. 2.6 cm hypoenhancing mass in the right upper kidney.  CT-guided biopsies 11/29 of sacral lesion showing metastatic adenocarcinoma consistent with gastrointestinal primary.  **Interim History She was transferred to [facility] for Initiation of Radiation. Medical Oncology and Radiological Oncology following. 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  -Biopsy positive for adenocarcinoma of intestinal origin.  -Old records personally reviewed, patient had a XI robot assisted sigmoidectomy on 07/2019 per Dr Dema Severin at [facility], pathology was positive for invasive adenocarcinoma well differentiated, surgical margins were negative for carcinoma and no evidence of carcinoma in 11 of 11 lymph nodes.  -Will consult oncology for bony metastatic disease to the sacrum; Dr. Betsy Coder consulted and discussed the biopsy results with the patient and recommended that she be seen by radiation oncology later for consideration of palliative radiation to the left sacral mass.  She was transferred to [facility] for radiation and Dr. Manuella Ghazi recommends outpatient follow-up to be scheduled in the medical oncology office when she nears completion of radiation.  Do not check a CEA as well. -Radiation oncology evaluating and recommending a 2-week course of daily palliative radiotherapy to the painful sacral metastasis and she underwent simulation and first radiation treatment this morning.  They are going to resume radiation treatment starting on Monday, 04/04/2020.  Radiation oncology would like to get a few treatments prior to the patient discharging to SNF -Dr. Benay Spice is going to submit the sacrum biopsy results for foundation 1 testing -Patient with persistent pain despite as needed hydromorphone and oxycodone, will add long  acting oxycodone and continue close monitoring; Now on Hydromorphone 1 mg IV q3hprn Severe Pain,  Oxycodone 10 mg po q12h, and Oxycodone-Acetaminophen 1 tab po q4hprn Moderate Pain -Aggressive bowel regime with miralax and fleet enema.  -C/w PT/OT Efforts; They are recommending SNF -Need TOC to assist with Placement  Acute Cystitis -Patient has completed antibiotic therapy.   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  -CBG's ranging from 112-209 -C/w Gabapentin 600 mg  -Continue to Adjust Insulin as Necessary  Obesity -Complicates overall prognosis and care -Estimated body mass index is 36.29 kg/m as calculated from the following:   Height as of this encounter: 5\' 2"  (1.575 m).   Weight as of this encounter: 90 kg. -Weight Loss and Dietary counseling given  Constipation -C/w Miralax 17 grams po BID and 17 gram daily prn, Senna-Docusate 1 tab po BIDprn Moderate Constipation  Stage 2 sacrum pressure ucler (present on admission).  -Continue local skin care.  -C/w Zinc Oxide Topically BID  Anxiety and Depression -Continue with Citalopram 30 mg po qHS   Hx of DVT -On apixaban for anticoagulation   Iron Deficiency Anemia -Continue with ferrous sulfate 325 mg po Daily  -Hb/Hct is relatively stable is 13.7/45.0 -Continue to Monitor for S/Sx of Bleeding; No overt bleeding noted -Repeat CBC in the AM    DVT prophylaxis: Anticoagulated with Apixaban 5 mg po BID Code Status: FULL CODE  Family Communication: No family present at bedside  Disposition Plan: Anticipating D/C to SNF but will need Placement and likely another Radiation Tx prior to D/C  Status is: Inpatient  Remains inpatient  appropriate because:Unsafe d/c plan, IV treatments appropriate due to intensity of illness or inability to take PO and Inpatient level of care appropriate due to severity of illness   Dispo: The patient is from: Home              Anticipated d/c is to: SNF              Anticipated d/c date is: 2 days              Patient currently is not medically stable to d/c.  Consultants:   Medical Oncology  Radiation Oncology   Procedures: Radiation Therapy   Antimicrobials:  Anti-infectives (From admission, onward)   Start     Dose/Rate Route Frequency Ordered Stop   03/27/20 2200  ceFAZolin (ANCEF) IVPB 1 g/50 mL premix        1 g 100 mL/hr over 30 Minutes Intravenous Every 8 hours 03/27/20 1211 03/29/20 2139   03/26/20 0000  cefTRIAXone (ROCEPHIN) 1 g in sodium chloride 0.9 % 100 mL IVPB  Status:  Discontinued        1 g 200 mL/hr over 30 Minutes Intravenous Every 24 hours 03/25/20 0333 03/27/20 1156   03/25/20 1000  fluconazole (DIFLUCAN) tablet 150 mg  Status:  Discontinued        150 mg Oral Daily 03/25/20 0556 03/27/20 0715   03/25/20 0045  cefTRIAXone (ROCEPHIN) 1 g in sodium chloride 0.9 % 100 mL IVPB        1 g 200 mL/hr over 30 Minutes Intravenous  Once 03/25/20 0037 03/25/20 0158        Subjective: Seen and examined at bedside and still was not feeling well.  Complains of significant  pain still.  No nausea or vomiting.  Did not sleep very well.  Appeared fatigued.  No other concerns or complaints at this time.  Objective: Vitals:   04/01/20 0300 04/01/20 0448 04/01/20 1046 04/01/20 1426  BP: 122/63 126/62 101/62 125/67  Pulse: 78 75 76 73  Resp: 16 18 16 15   Temp: 98.4 F (36.9 C) 98.2 F (36.8 C) (!) 97.5 F (36.4 C) 97.8 F (36.6 C)  TempSrc: Oral Oral Oral Oral  SpO2: 95% 91% 95% 90%  Weight:  90 kg    Height:  5\' 2"  (1.575 m)      Intake/Output Summary (Last 24 hours) at 04/01/2020 1610 Last data filed at 04/01/2020 1000 Gross per 24 hour  Intake 240 ml    Output --  Net 240 ml   Filed Weights   03/24/20 2140 04/01/20 0448  Weight: 111.9 kg 90 kg   Examination: Physical Exam:  Constitutional: WN/WD obese Caucasian female in NAD and appears calm but a little uncomfortable and fatigued Eyes: Lids and conjunctivae normal, sclerae anicteric  ENMT: External Ears, Nose appear normal. Grossly normal hearing.  Neck: Appears normal, supple, no cervical masses, normal ROM, no appreciable thyromegaly; no JVD Respiratory: Diminished to auscultation bilaterally, no wheezing, rales, rhonchi or crackles. Normal respiratory effort and patient is not tachypenic. No accessory muscle use.  Cardiovascular: RRR, no murmurs / rubs / gallops. S1 and S2 auscultated. Trace extremity edema. Abdomen: Soft, non-tender, distended secondary to body habitus GU: Deferred.  Musculoskeletal: No clubbing / cyanosis of digits/nails. No joint deformity upper and lower extremities.  Skin: Has a stage II pressure ulcer present on admission no induration; Warm and dry.  Neurologic: CN 2-12 grossly intact with no focal deficits.  Romberg sign cerebellar reflexes not assessed.  Psychiatric: Normal judgment and insight. Alert and oriented x 3. Normal mood and has a slightly flat affect affect.   Data Reviewed: I have personally reviewed following labs and imaging studies  CBC: Recent Labs  Lab 03/28/20 0437 03/29/20 0505 03/30/20 0138 03/31/20 0247 04/01/20 1035  WBC 9.6 8.4 7.0 6.9 6.6  NEUTROABS  --   --   --   --  3.4  HGB 13.4 12.5 12.4 13.3 13.7  HCT 43.2 40.5 39.3 43.3 45.0  MCV 93.7 92.7 92.9 93.7 95.7  PLT 214 214 207 204 322   Basic Metabolic Panel: Recent Labs  Lab 04/01/20 1035  NA 140  K 3.9  CL 97*  CO2 33*  GLUCOSE 158*  BUN 19  CREATININE 1.04*  CALCIUM 9.5  MG 2.0  PHOS 3.1   GFR: Estimated Creatinine Clearance: 53.3 mL/min (A) (by C-G formula based on SCr of 1.04 mg/dL (H)). Liver Function Tests: Recent Labs  Lab 04/01/20 1035  AST  28  ALT 12  ALKPHOS 109  BILITOT 1.1  PROT 7.2  ALBUMIN 2.9*   No results for input(s): LIPASE, AMYLASE in the last 168 hours. No results for input(s): AMMONIA in the last 168 hours. Coagulation Profile: No results for input(s): INR, PROTIME in the last 168 hours. Cardiac Enzymes: No results for input(s): CKTOTAL, CKMB, CKMBINDEX, TROPONINI in the last 168 hours. BNP (last 3 results) No results for input(s): PROBNP in the last 8760 hours. HbA1C: No results for input(s): HGBA1C in the last 72 hours. CBG: Recent Labs  Lab 03/31/20 1117 03/31/20 1631 03/31/20 2248 04/01/20 0749 04/01/20 1209  GLUCAP 145* 136* 209* 112* 160*   Lipid Profile: No results for input(s): CHOL, HDL, LDLCALC,  TRIG, CHOLHDL, LDLDIRECT in the last 72 hours. Thyroid Function Tests: No results for input(s): TSH, T4TOTAL, FREET4, T3FREE, THYROIDAB in the last 72 hours. Anemia Panel: No results for input(s): VITAMINB12, FOLATE, FERRITIN, TIBC, IRON, RETICCTPCT in the last 72 hours. Sepsis Labs: No results for input(s): PROCALCITON, LATICACIDVEN in the last 168 hours.  Recent Results (from the past 240 hour(s))  Culture, Urine     Status: Abnormal   Collection Time: 03/24/20 11:11 PM   Specimen: Urine, Clean Catch  Result Value Ref Range Status   Specimen Description URINE, CLEAN CATCH  Final   Special Requests   Final    NONE Performed at [facility], [address] [place], [place] [address]    Culture >=100,000 COLONIES/mL ESCHERICHIA COLI (A)  Final   Report Status 03/27/2020 FINAL  Final   Organism ID, Bacteria ESCHERICHIA COLI (A)  Final      Susceptibility   Escherichia coli - MIC*    AMPICILLIN 4 SENSITIVE Sensitive     CEFAZOLIN <=4 SENSITIVE Sensitive     CEFEPIME <=0.12 SENSITIVE Sensitive     CEFTRIAXONE <=0.25 SENSITIVE Sensitive     CIPROFLOXACIN <=0.25 SENSITIVE Sensitive     GENTAMICIN <=1 SENSITIVE Sensitive     IMIPENEM <=0.25 SENSITIVE Sensitive     NITROFURANTOIN  <=16 SENSITIVE Sensitive     TRIMETH/SULFA <=20 SENSITIVE Sensitive     AMPICILLIN/SULBACTAM <=2 SENSITIVE Sensitive     PIP/TAZO <=4 SENSITIVE Sensitive     * >=100,000 COLONIES/mL ESCHERICHIA COLI  Resp Panel by RT-PCR (Flu A&B, Covid) Nasopharyngeal Swab     Status: None   Collection Time: 03/25/20  2:36 AM   Specimen: Nasopharyngeal Swab; Nasopharyngeal(NP) swabs in vial transport medium  Result Value Ref Range Status   SARS Coronavirus 2 by RT PCR NEGATIVE NEGATIVE Final    Comment: (NOTE) SARS-CoV-2 target nucleic acids are NOT DETECTED.  The SARS-CoV-2 RNA is generally detectable in upper respiratory specimens during the acute phase of infection. The lowest concentration of SARS-CoV-2 viral copies this assay can detect is 138 copies/mL. A negative result does not preclude SARS-Cov-2 infection and should not be used as the sole basis for treatment or other patient management decisions. A negative result may occur with  improper specimen collection/handling, submission of specimen other than nasopharyngeal swab, presence of viral mutation(s) within the areas targeted by this assay, and inadequate number of viral copies(<138 copies/mL). A negative result must be combined with clinical observations, patient history, and epidemiological information. The expected result is Negative.  Fact Sheet for Patients:  EntrepreneurPulse.com.au  Fact Sheet for Healthcare Providers:  IncredibleEmployment.be  This test is no t yet approved or cleared by the [place] FDA and  has been authorized for detection and/or diagnosis of SARS-CoV-2 by FDA under an Emergency Use Authorization (EUA). This EUA will remain  in effect (meaning this test can be used) for the duration of the COVID-19 declaration under Section 564(b)(1) of the Act, 21 U.S.C.section 360bbb-3(b)(1), unless the authorization is terminated  or revoked sooner.       Influenza A by PCR  NEGATIVE NEGATIVE Final   Influenza B by PCR NEGATIVE NEGATIVE Final    Comment: (NOTE) The Xpert Xpress SARS-CoV-2/FLU/RSV plus assay is intended as an aid in the diagnosis of influenza from Nasopharyngeal swab specimens and should not be used as a sole basis for treatment. Nasal washings and aspirates are unacceptable for Xpert Xpress SARS-CoV-2/FLU/RSV testing.  Fact Sheet for Patients: EntrepreneurPulse.com.au  Fact  Sheet for Healthcare Providers: IncredibleEmployment.be  This test is not yet approved or cleared by the [place] and has been authorized for detection and/or diagnosis of SARS-CoV-2 by FDA under an Emergency Use Authorization (EUA). This EUA will remain in effect (meaning this test can be used) for the duration of the COVID-19 declaration under Section 564(b)(1) of the Act, 21 U.S.C. section 360bbb-3(b)(1), unless the authorization is terminated or revoked.  Performed at [facility], [place] [address], [place], [place] [address]      RN Pressure Injury Documentation: Pressure Injury 03/25/20 Sacrum Right;Left Stage 2 -  Partial thickness loss of dermis presenting as a shallow open injury with a red, pink wound bed without slough. (Active)  03/25/20 0430  Location: Sacrum  Location Orientation: Right;Left  Staging: Stage 2 -  Partial thickness loss of dermis presenting as a shallow open injury with a red, pink wound bed without slough.  Wound Description (Comments):   Present on Admission: Yes     Estimated body mass index is 36.29 kg/m as calculated from the following:   Height as of this encounter: 5\' 2"  (1.575 m).   Weight as of this encounter: 90 kg.  Malnutrition Type:  Nutrition Problem: Increased nutrient needs Etiology: cancer and cancer related treatments   Malnutrition Characteristics:  Signs/Symptoms: estimated needs  Nutrition Interventions:  Interventions: Ensure Enlive (each supplement  provides 350kcal and 20 grams of protein), MVI   Radiology Studies: No results found.  Scheduled Meds: . apixaban  5 mg Oral BID  . carvedilol  3.125 mg Oral BID WC  . citalopram  30 mg Oral QHS  . feeding supplement  237 mL Oral BID BM  . ferrous sulfate  325 mg Oral Q breakfast  . gabapentin  600 mg Oral TID  . insulin aspart  0-15 Units Subcutaneous TID AC & HS  . insulin glargine  40 Units Subcutaneous Q2200  . multivitamin with minerals  1 tablet Oral Daily  . nystatin   Topical BID  . oxyCODONE  10 mg Oral Q12H  . pantoprazole  40 mg Oral Daily  . polyethylene glycol  17 g Oral BID  . Zinc Oxide   Topical BID   Continuous Infusions:   LOS: 6 days   Kerney Elbe, DO [facility] PAGER is on AMION  If 7PM-7AM, please contact night-coverage [email or web address]

## 2020-04-02 DIAGNOSIS — C7951 Secondary malignant neoplasm of bone: Secondary | ICD-10-CM | POA: Diagnosis not present

## 2020-04-02 LAB — COMPREHENSIVE METABOLIC PANEL
ALT: 14 U/L (ref 0–44)
AST: 32 U/L (ref 15–41)
Albumin: 3.3 g/dL — ABNORMAL LOW (ref 3.5–5.0)
Alkaline Phosphatase: 132 U/L — ABNORMAL HIGH (ref 38–126)
Anion gap: 11 (ref 5–15)
BUN: 22 mg/dL (ref 8–23)
CO2: 33 mmol/L — ABNORMAL HIGH (ref 22–32)
Calcium: 9.6 mg/dL (ref 8.9–10.3)
Chloride: 94 mmol/L — ABNORMAL LOW (ref 98–111)
Creatinine, Ser: 1.01 mg/dL — ABNORMAL HIGH (ref 0.44–1.00)
GFR, Estimated: >60 mL/min (ref >60)
Glucose, Bld: 111 mg/dL — ABNORMAL HIGH (ref 70–99)
Potassium: 3.8 mmol/L (ref 3.5–5.1)
Sodium: 138 mmol/L (ref 135–145)
Total Bilirubin: 1.2 mg/dL (ref 0.3–1.2)
Total Protein: 8 g/dL (ref 6.5–8.1)

## 2020-04-02 LAB — CEA: CEA: 27.7 ng/mL — ABNORMAL HIGH (ref 0.0–4.7)

## 2020-04-02 LAB — GLUCOSE, CAPILLARY
Glucose-Capillary: 103 mg/dL — ABNORMAL HIGH (ref 70–99)
Glucose-Capillary: 125 mg/dL — ABNORMAL HIGH (ref 70–99)
Glucose-Capillary: 185 mg/dL — ABNORMAL HIGH (ref 70–99)

## 2020-04-02 LAB — CBC WITH DIFFERENTIAL/PLATELET
Abs Immature Granulocytes: 0.02 10*3/uL (ref 0.00–0.07)
Basophils Absolute: 0.1 10*3/uL (ref 0.0–0.1)
Basophils Relative: 1 %
Eosinophils Absolute: 0.4 10*3/uL (ref 0.0–0.5)
Eosinophils Relative: 5 %
HCT: 47.1 % — ABNORMAL HIGH (ref 36.0–46.0)
Hemoglobin: 14.3 g/dL (ref 12.0–15.0)
Immature Granulocytes: 0 %
Lymphocytes Relative: 32 %
Lymphs Abs: 2.4 10*3/uL (ref 0.7–4.0)
MCH: 29.2 pg (ref 26.0–34.0)
MCHC: 30.4 g/dL (ref 30.0–36.0)
MCV: 96.1 fL (ref 80.0–100.0)
Monocytes Absolute: 0.9 10*3/uL (ref 0.1–1.0)
Monocytes Relative: 13 %
Neutro Abs: 3.5 10*3/uL (ref 1.7–7.7)
Neutrophils Relative %: 49 %
Platelets: 211 10*3/uL (ref 150–400)
RBC: 4.9 MIL/uL (ref 3.87–5.11)
RDW: 20.4 % — ABNORMAL HIGH (ref 11.5–15.5)
WBC: 7.4 10*3/uL (ref 4.0–10.5)
nRBC: 0 % (ref 0.0–0.2)

## 2020-04-02 LAB — PHOSPHORUS: Phosphorus: 4 mg/dL (ref 2.5–4.6)

## 2020-04-02 LAB — MAGNESIUM: Magnesium: 2.3 mg/dL (ref 1.7–2.4)

## 2020-04-02 NOTE — Progress Notes (Signed)
PROGRESS NOTE    Tonya Dougherty  WGY:[id number] DOB: [birth year] DOA: 03/24/2020 PCP: Gay Filler, MD   Brief Narrative:   Tonya Dougherty admitted to the hospital with a working diagnosis of ambulatory dysfunction due tometastatic adenocarcinoma(gastrointesitnal primary)at thesacrumon the left with invasion of S1 and S2 neural foramina.  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 Presents with progressive weakness and left buttock/thigh pain. Patient had a subacute left hip/buttock pain, that was refractory to outpatient management. Over the last 10 days prior to hospitalization she developed difficulty ambulating and progressive weakness in the left lower extremity. On her initial physical examination blood pressure 136/56, heart rate 87, respirate 15, oxygen saturation 95%, her lungs were clear to auscultation bilaterally, heart S1-S2, present rhythmic, soft abdomen and no lower extremity edema.  Thoracic lumbar MRI positive for hyperintense lesions involving the central and left aspect of the sacrum, concerning for osseous metastatic disease. Probably early invasion of the adjacent left S1-S2 neural foramina. Positive degenerative disc disease,  Further work-up with CT chest, abdomen pelvis showed destructive left sacral lesion with associated 2.8 x 3.5 cm soft tissue component anterior to the left sacrum worrisome for metastasis. 2.6 cm hypoenhancing mass in the right upper kidney.  CT-guided biopsies 11/29 of sacral lesion showing metastatic adenocarcinoma consistent with gastrointestinal primary.  She was transferred to [facility] for initiation of XRT. She will transfer to SNF after.   12/4: No acute events ON. She denies complaints today. She is eager to get home. Working on SNF placement. No changes for today.    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      - Biopsy positive for adenocarcinoma of intestinal origin.     - Hx of XI robot assisted sigmoidectomy on 07/2019 per Dr Dema Severin at [facility], pathology was positive for invasive adenocarcinoma well differentiated, surgical margins were negative for carcinoma and no evidence of carcinoma in 11 of 11 lymph nodes.     - onco (Dr. Betsy Coder) consulted and discussed the biopsy results with the patient and recommended that she be seen by radiation oncology later for consideration of palliative radiation to the left sacral mass.  She was transferred to [facility] for radiation     - Radiation oncology evaluating and recommending a 2-week course of daily palliative radiotherapy to the painful sacral metastasis and she underwent simulation and first radiation treatment this morning.  They are going to resume radiation treatment starting on Monday, 04/04/2020.  Radiation oncology would like to get a few treatments prior to the patient discharging to SNF     - Dr. Benay Spice is going to submit the sacrum biopsy results for foundation 1 testing     - Aggressive bowel regime with miralax and fleet enema.     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      - continuelocal skin care  Anxiety and Depression     -  continue citalopram  Hx of DVT     - continue eliquis  Iron Deficiency Anemia     - no signs of bleed; continue iron, follow  DVT prophylaxis: eliquis Code Status: FULL Family Communication: None at bedside   Status is: Inpatient  Remains inpatient appropriate  because:Unsafe d/c plan   Dispo: The patient is from: Home              Anticipated d/c is to: SNF              Anticipated d/c date is: 2 days              Patient currently is not medically stable to d/c.  Consultants:   Rad/Medical Onco  Procedures:   XRT  ROS:  Denies CP, dyspnea, palpitations, N/V . Remainder ROS is negative for all not previously mentioned.  Subjective: "Do you think I can go home?"  Objective: Vitals:   04/01/20 1733 04/01/20 2058 04/02/20 0555 04/02/20 1445  BP: 118/64 119/72 (!) 152/100 129/64  Pulse: 73 80 94 81  Resp: 18 18 16 16   Temp: (!) 97.4 F (36.3 C) 98 F (36.7 C) 97.7 F (36.5 C) 97.9 F (36.6 C)  TempSrc: Oral Oral Oral Oral  SpO2: 93% 92% 95% 97%  Weight:      Height:        Intake/Output Summary (Last 24 hours) at 04/02/2020 1647 Last data filed at 04/01/2020 1800 Gross per 24 hour  Intake 240 ml  Output --  Net 240 ml   Filed Weights   03/24/20 2140 04/01/20 0448  Weight: 111.9 kg 90 kg    Examination:  General: 69 y.o. female resting in bed in NAD Eyes: PERRL, normal sclera ENMT: Nares patent w/o discharge, orophaynx clear, dentition normal, ears w/o discharge/lesions/ulcers Neck: Supple, trachea midline Cardiovascular: RRR, +S1, S2, no m/g/r, equal pulses throughout Respiratory: CTABL, no w/r/r, normal WOB GI: BS+, NDNT, no masses noted, no organomegaly noted MSK: No e/c/c Skin: No rashes, bruises, ulcerations noted Neuro: A&O x 3, no focal deficits Psyc: Appropriate interaction and affect, calm/cooperative   Data Reviewed: I have personally reviewed following labs and imaging studies.  CBC: Recent Labs  Lab 03/29/20 0505 03/30/20 0138 03/31/20 0247 04/01/20 1035 04/02/20 0750  WBC 8.4 7.0 6.9 6.6 7.4  NEUTROABS  --   --   --  3.4 3.5  HGB 12.5 12.4 13.3 13.7 14.3  HCT 40.5 39.3 43.3 45.0 47.1*  MCV 92.7 92.9 93.7 95.7 96.1  PLT 214 207 204 200 213   Basic Metabolic Panel: Recent Labs  Lab  04/01/20 1035 04/02/20 0750  NA 140 138  K 3.9 3.8  CL 97* 94*  CO2 33* 33*  GLUCOSE 158* 111*  BUN 19 22  CREATININE 1.04* 1.01*  CALCIUM 9.5 9.6  MG 2.0 2.3  PHOS 3.1 4.0   GFR: Estimated Creatinine Clearance: 54.9 mL/min (A) (by C-G formula based on SCr of 1.01 mg/dL (H)). Liver Function Tests: Recent Labs  Lab 04/01/20 1035 04/02/20 0750  AST 28 32  ALT 12 14  ALKPHOS 109 132*  BILITOT 1.1 1.2  PROT 7.2 8.0  ALBUMIN 2.9* 3.3*   No results for input(s): LIPASE, AMYLASE in the last 168 hours. No results for input(s): AMMONIA in the last 168 hours. Coagulation Profile: No results for input(s): INR, PROTIME in the last 168 hours. Cardiac Enzymes: No results for input(s): CKTOTAL, CKMB, CKMBINDEX, TROPONINI in the last 168 hours. BNP (last 3  results) No results for input(s): PROBNP in the last 8760 hours. HbA1C: No results for input(s): HGBA1C in the last 72 hours. CBG: Recent Labs  Lab 04/01/20 1209 04/01/20 1728 04/01/20 2056 04/02/20 0736 04/02/20 1209  GLUCAP 160* 142* 171* 103* 125*   Lipid Profile: No results for input(s): CHOL, HDL, LDLCALC, TRIG, CHOLHDL, LDLDIRECT in the last 72 hours. Thyroid Function Tests: No results for input(s): TSH, T4TOTAL, FREET4, T3FREE, THYROIDAB in the last 72 hours. Anemia Panel: No results for input(s): VITAMINB12, FOLATE, FERRITIN, TIBC, IRON, RETICCTPCT in the last 72 hours. Sepsis Labs: No results for input(s): PROCALCITON, LATICACIDVEN in the last 168 hours.  Recent Results (from the past 240 hour(s))  Culture, Urine     Status: Abnormal   Collection Time: 03/24/20 11:11 PM   Specimen: Urine, Clean Catch  Result Value Ref Range Status   Specimen Description URINE, CLEAN CATCH  Final   Special Requests   Final    NONE Performed at [facility], [address] [place], [place] [address]    Culture >=100,000 COLONIES/mL ESCHERICHIA COLI (A)  Final   Report Status 03/27/2020 FINAL  Final   Organism ID,  Bacteria ESCHERICHIA COLI (A)  Final      Susceptibility   Escherichia coli - MIC*    AMPICILLIN 4 SENSITIVE Sensitive     CEFAZOLIN <=4 SENSITIVE Sensitive     CEFEPIME <=0.12 SENSITIVE Sensitive     CEFTRIAXONE <=0.25 SENSITIVE Sensitive     CIPROFLOXACIN <=0.25 SENSITIVE Sensitive     GENTAMICIN <=1 SENSITIVE Sensitive     IMIPENEM <=0.25 SENSITIVE Sensitive     NITROFURANTOIN <=16 SENSITIVE Sensitive     TRIMETH/SULFA <=20 SENSITIVE Sensitive     AMPICILLIN/SULBACTAM <=2 SENSITIVE Sensitive     PIP/TAZO <=4 SENSITIVE Sensitive     * >=100,000 COLONIES/mL ESCHERICHIA COLI  Resp Panel by RT-PCR (Flu A&B, Covid) Nasopharyngeal Swab     Status: None   Collection Time: 03/25/20  2:36 AM   Specimen: Nasopharyngeal Swab; Nasopharyngeal(NP) swabs in vial transport medium  Result Value Ref Range Status   SARS Coronavirus 2 by RT PCR NEGATIVE NEGATIVE Final    Comment: (NOTE) SARS-CoV-2 target nucleic acids are NOT DETECTED.  The SARS-CoV-2 RNA is generally detectable in upper respiratory specimens during the acute phase of infection. The lowest concentration of SARS-CoV-2 viral copies this assay can detect is 138 copies/mL. A negative result does not preclude SARS-Cov-2 infection and should not be used as the sole basis for treatment or other patient management decisions. A negative result may occur with  improper specimen collection/handling, submission of specimen other than nasopharyngeal swab, presence of viral mutation(s) within the areas targeted by this assay, and inadequate number of viral copies(<138 copies/mL). A negative result must be combined with clinical observations, patient history, and epidemiological information. The expected result is Negative.  Fact Sheet for Patients:  EntrepreneurPulse.com.au  Fact Sheet for Healthcare Providers:  IncredibleEmployment.be  This test is no t yet approved or cleared by the [place] FDA  and  has been authorized for detection and/or diagnosis of SARS-CoV-2 by FDA under an Emergency Use Authorization (EUA). This EUA will remain  in effect (meaning this test can be used) for the duration of the COVID-19 declaration under Section 564(b)(1) of the Act, 21 U.S.C.section 360bbb-3(b)(1), unless the authorization is terminated  or revoked sooner.       Influenza A by PCR NEGATIVE NEGATIVE Final   Influenza B by PCR NEGATIVE NEGATIVE Final  Comment: (NOTE) The Xpert Xpress SARS-CoV-2/FLU/RSV plus assay is intended as an aid in the diagnosis of influenza from Nasopharyngeal swab specimens and should not be used as a sole basis for treatment. Nasal washings and aspirates are unacceptable for Xpert Xpress SARS-CoV-2/FLU/RSV testing.  Fact Sheet for Patients: EntrepreneurPulse.com.au  Fact Sheet for Healthcare Providers: IncredibleEmployment.be  This test is not yet approved or cleared by the [place] FDA and has been authorized for detection and/or diagnosis of SARS-CoV-2 by FDA under an Emergency Use Authorization (EUA). This EUA will remain in effect (meaning this test can be used) for the duration of the COVID-19 declaration under Section 564(b)(1) of the Act, 21 U.S.C. section 360bbb-3(b)(1), unless the authorization is terminated or revoked.  Performed at [facility], [place] [address], [place], [place] [address]       Radiology Studies: No results found.   Scheduled Meds: . apixaban  5 mg Oral BID  . carvedilol  3.125 mg Oral BID WC  . citalopram  30 mg Oral QHS  . feeding supplement  237 mL Oral BID BM  . ferrous sulfate  325 mg Oral Q breakfast  . gabapentin  600 mg Oral TID  . insulin aspart  0-15 Units Subcutaneous TID AC & HS  . insulin glargine  40 Units Subcutaneous Q2200  . multivitamin with minerals  1 tablet Oral Daily  . nystatin   Topical BID  . oxyCODONE  10 mg Oral Q12H  . pantoprazole  40 mg  Oral Daily  . polyethylene glycol  17 g Oral BID  . Zinc Oxide   Topical BID   Continuous Infusions:   LOS: 7 days    Time spent: 25 minutes spent in the coordination of care today.    Jonnie Finner, DO [facility]  If 7PM-7AM, please contact night-coverage [email or web address] 04/02/2020, 4:47 PM

## 2020-04-03 DIAGNOSIS — C7951 Secondary malignant neoplasm of bone: Secondary | ICD-10-CM | POA: Diagnosis not present

## 2020-04-03 LAB — GLUCOSE, CAPILLARY
Glucose-Capillary: 100 mg/dL — ABNORMAL HIGH (ref 70–99)
Glucose-Capillary: 134 mg/dL — ABNORMAL HIGH (ref 70–99)
Glucose-Capillary: 114 mg/dL — ABNORMAL HIGH (ref 70–99)
Glucose-Capillary: 133 mg/dL — ABNORMAL HIGH (ref 70–99)

## 2020-04-03 NOTE — Progress Notes (Signed)
PROGRESS NOTE    Tonya Dougherty  LAG:[id number] DOB: 1950/10/09 DOA: 03/24/2020 PCP: Gay Filler, MD   Brief Narrative:   Tonya Dougherty admitted to the hospital with a working diagnosis of ambulatory dysfunction due tometastatic adenocarcinoma(gastrointesitnal primary)at thesacrumon the left with invasion of S1 and S2 neural foramina.  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 Presents with progressive weakness and left buttock/thigh pain. Patient had a subacute left hip/buttock pain, that was refractory to outpatient management. Over the last 10 days prior to hospitalization she developed difficulty ambulating and progressive weakness in the left lower extremity. On her initial physical examination blood pressure 136/56, heart rate 87, respirate 15, oxygen saturation 95%, her lungs were clear to auscultation bilaterally, heart S1-S2, present rhythmic, soft abdomen and no lower extremity edema.  Thoracic lumbar MRI positive for hyperintense lesions involving the central and left aspect of the sacrum, concerning for osseous metastatic disease. Probably early invasion of the adjacent left S1-S2 neural foramina. Positive degenerative disc disease,  Further work-up with CT chest, abdomen pelvis showed destructive left sacral lesion with associated 2.8 x 3.5 cm soft tissue component anterior to the left sacrum worrisome for metastasis. 2.6 cm hypoenhancing mass in the right upper kidney.  CT-guided biopsies 11/29 of sacral lesion showing metastatic adenocarcinoma consistent with gastrointestinal primary.  She was transferred to [facility] for initiation of XRT. She will transfer to SNF after.   12/5: No acute events ON. She denies complaints. Plan is to get a couple of treatments of XRT in before discharge to SNF. Second dose will be  tomorrow. CM working on placement.   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      - Biopsy positive for adenocarcinoma of intestinal origin.     - Hx of XI robot assisted sigmoidectomy on 07/2019 per Dr Dema Severin at [facility], pathology was positive for invasive adenocarcinoma well differentiated, surgical margins were negative for carcinoma and no evidence of carcinoma in 11 of 11 lymph nodes.     - onco (Dr. Betsy Coder) consulted and discussed the biopsy results with the patient and recommended that she be seen by radiation oncology later for consideration of palliative radiation to the left sacral mass.  She was transferred to [facility] for radiation     - Radiation oncology evaluating and recommending a 2-week course of daily palliative radiotherapy to the painful sacral metastasis and she underwent simulation and first radiation treatment this morning.  They are going to resume radiation treatment starting on Monday, 04/04/2020.  Radiation oncology would like to get a few treatments prior to the patient discharging to SNF     - Dr. Benay Spice is going to submit the sacrum biopsy results for foundation 1 testing     - Aggressive bowel regime with miralax and fleet enema.     - PT/OT: recommending SNF     - 12/5: No acute events ON. Denies complaints. Continue XRT.   Acute Cystitis     - completed abx; denies new Sx  HTN     - continue coreg     - 12/5: BP looks good. 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      - continuelocal skin care  Anxiety and Depression     - continue citalopram  Hx of DVT     - continue eliquis  Iron Deficiency Anemia     - no signs of bleed;  continue iron, follow  DVT prophylaxis: eliquis Code Status: FULL Family Communication: None at bedside.   Status is: Inpatient  Remains inpatient appropriate because:Unsafe d/c plan   Dispo: The patient is from: Home              Anticipated d/c is to: SNF              Anticipated d/c date is: 2 days              Patient currently is not medically stable to d/c.   Consultants:   Rad/med onc  Procedures:   XRT   ROS:  Denies CP, N, V, ab pain, palpitations, dyspnea. Remainder ROS is negative for all not previously mentioned.  Subjective: "Just cruising with the top down."  Objective: Vitals:   04/02/20 0555 04/02/20 1445 04/02/20 2200 04/03/20 0641  BP: (!) 152/100 129/64 115/64 127/67  Pulse: 94 81 76 81  Resp: 16 16 16 16   Temp: 97.7 F (36.5 C) 97.9 F (36.6 C) (!) 97.5 F (36.4 C) 98.1 F (36.7 C)  TempSrc: Oral Oral Oral Oral  SpO2: 95% 97% 93% 93%  Weight:      Height:        Intake/Output Summary (Last 24 hours) at 04/03/2020 1419 Last data filed at 04/03/2020 0830 Gross per 24 hour  Intake 240 ml  Output --  Net 240 ml   Filed Weights   03/24/20 2140 04/01/20 0448  Weight: 111.9 kg 90 kg    Examination:  General: 69 y.o. female resting in bed in NAD Eyes: PERRL, normal sclera ENMT: Nares patent w/o discharge, orophaynx clear, dentition normal, ears w/o discharge/lesions/ulcers Neck: Supple, trachea midline Cardiovascular: RRR, +S1, S2, no m/g/r, equal pulses throughout Respiratory: CTABL, no w/r/r, normal WOB GI: BS+, NDNT, no masses noted, no organomegaly noted MSK: No e/c/c Skin: No rashes, bruises, ulcerations noted Neuro: A&O x 3, no focal deficits Psyc: Appropriate interaction and affect, calm/cooperative   Data Reviewed: I have personally reviewed following labs and imaging studies.  CBC: Recent Labs  Lab 03/29/20 0505 03/30/20 0138 03/31/20 0247 04/01/20 1035 04/02/20 0750  WBC 8.4 7.0 6.9 6.6 7.4  NEUTROABS  --   --    --  3.4 3.5  HGB 12.5 12.4 13.3 13.7 14.3  HCT 40.5 39.3 43.3 45.0 47.1*  MCV 92.7 92.9 93.7 95.7 96.1  PLT 214 207 204 200 956   Basic Metabolic Panel: Recent Labs  Lab 04/01/20 1035 04/02/20 0750  NA 140 138  K 3.9 3.8  CL 97* 94*  CO2 33* 33*  GLUCOSE 158* 111*  BUN 19 22  CREATININE 1.04* 1.01*  CALCIUM 9.5 9.6  MG 2.0 2.3  PHOS 3.1 4.0   GFR: Estimated Creatinine Clearance: 54.9 mL/min (A) (by C-G formula based on SCr of 1.01 mg/dL (H)). Liver Function Tests: Recent Labs  Lab 04/01/20 1035 04/02/20 0750  AST 28 32  ALT 12 14  ALKPHOS 109 132*  BILITOT 1.1 1.2  PROT 7.2 8.0  ALBUMIN 2.9* 3.3*   No results for input(s): LIPASE, AMYLASE in the last 168 hours. No results for input(s): AMMONIA in the  last 168 hours. Coagulation Profile: No results for input(s): INR, PROTIME in the last 168 hours. Cardiac Enzymes: No results for input(s): CKTOTAL, CKMB, CKMBINDEX, TROPONINI in the last 168 hours. BNP (last 3 results) No results for input(s): PROBNP in the last 8760 hours. HbA1C: No results for input(s): HGBA1C in the last 72 hours. CBG: Recent Labs  Lab 04/02/20 1209 04/02/20 1701 04/02/20 2202 04/03/20 0736 04/03/20 1150  GLUCAP 125* 185* 100* 134* 114*   Lipid Profile: No results for input(s): CHOL, HDL, LDLCALC, TRIG, CHOLHDL, LDLDIRECT in the last 72 hours. Thyroid Function Tests: No results for input(s): TSH, T4TOTAL, FREET4, T3FREE, THYROIDAB in the last 72 hours. Anemia Panel: No results for input(s): VITAMINB12, FOLATE, FERRITIN, TIBC, IRON, RETICCTPCT in the last 72 hours. Sepsis Labs: No results for input(s): PROCALCITON, LATICACIDVEN in the last 168 hours.  Recent Results (from the past 240 hour(s))  Culture, Urine     Status: Abnormal   Collection Time: 03/24/20 11:11 PM   Specimen: Urine, Clean Catch  Result Value Ref Range Status   Specimen Description URINE, CLEAN CATCH  Final   Special Requests   Final    NONE Performed at [facility], [address] [place], [place] [address]    Culture >=100,000 COLONIES/mL ESCHERICHIA COLI (A)  Final   Report Status 03/27/2020 FINAL  Final   Organism ID, Bacteria ESCHERICHIA COLI (A)  Final      Susceptibility   Escherichia coli - MIC*    AMPICILLIN 4 SENSITIVE Sensitive     CEFAZOLIN <=4 SENSITIVE Sensitive     CEFEPIME <=0.12 SENSITIVE Sensitive     CEFTRIAXONE <=0.25 SENSITIVE Sensitive     CIPROFLOXACIN <=0.25 SENSITIVE Sensitive     GENTAMICIN <=1 SENSITIVE Sensitive     IMIPENEM <=0.25 SENSITIVE Sensitive     NITROFURANTOIN <=16 SENSITIVE Sensitive     TRIMETH/SULFA <=20 SENSITIVE Sensitive     AMPICILLIN/SULBACTAM <=2 SENSITIVE Sensitive     PIP/TAZO <=4 SENSITIVE Sensitive     * >=100,000 COLONIES/mL ESCHERICHIA COLI  Resp Panel by RT-PCR (Flu A&B, Covid) Nasopharyngeal Swab     Status: None   Collection Time: 03/25/20  2:36 AM   Specimen: Nasopharyngeal Swab; Nasopharyngeal(NP) swabs in vial transport medium  Result Value Ref Range Status   SARS Coronavirus 2 by RT PCR NEGATIVE NEGATIVE Final    Comment: (NOTE) SARS-CoV-2 target nucleic acids are NOT DETECTED.  The SARS-CoV-2 RNA is generally detectable in upper respiratory specimens during the acute phase of infection. The lowest concentration of SARS-CoV-2 viral copies this assay can detect is 138 copies/mL. A negative result does not preclude SARS-Cov-2 infection and should not be used as the sole basis for treatment or other patient management decisions. A negative result may occur with  improper specimen collection/handling, submission of specimen other than nasopharyngeal swab, presence of viral mutation(s) within the areas targeted by this assay, and inadequate number of viral copies(<138 copies/mL). A negative result must be combined with clinical observations, patient history, and epidemiological information. The expected result is Negative.  Fact Sheet for Patients:   EntrepreneurPulse.com.au  Fact Sheet for Healthcare Providers:  IncredibleEmployment.be  This test is no t yet approved or cleared by the [place] FDA and  has been authorized for detection and/or diagnosis of SARS-CoV-2 by FDA under an Emergency Use Authorization (EUA). This EUA will remain  in effect (meaning this test can be used) for the duration of the COVID-19 declaration under Section 564(b)(1) of the Act,  21 U.S.C.section 360bbb-3(b)(1), unless the authorization is terminated  or revoked sooner.       Influenza A by PCR NEGATIVE NEGATIVE Final   Influenza B by PCR NEGATIVE NEGATIVE Final    Comment: (NOTE) The Xpert Xpress SARS-CoV-2/FLU/RSV plus assay is intended as an aid in the diagnosis of influenza from Nasopharyngeal swab specimens and should not be used as a sole basis for treatment. Nasal washings and aspirates are unacceptable for Xpert Xpress SARS-CoV-2/FLU/RSV testing.  Fact Sheet for Patients: EntrepreneurPulse.com.au  Fact Sheet for Healthcare Providers: IncredibleEmployment.be  This test is not yet approved or cleared by the [place] FDA and has been authorized for detection and/or diagnosis of SARS-CoV-2 by FDA under an Emergency Use Authorization (EUA). This EUA will remain in effect (meaning this test can be used) for the duration of the COVID-19 declaration under Section 564(b)(1) of the Act, 21 U.S.C. section 360bbb-3(b)(1), unless the authorization is terminated or revoked.  Performed at [facility], [place] [address], [place], [place] [address]       Radiology Studies: No results found.   Scheduled Meds: . apixaban  5 mg Oral BID  . carvedilol  3.125 mg Oral BID WC  . citalopram  30 mg Oral QHS  . feeding supplement  237 mL Oral BID BM  . ferrous sulfate  325 mg Oral Q breakfast  . gabapentin  600 mg Oral TID  . insulin aspart  0-15 Units  Subcutaneous TID AC & HS  . insulin glargine  40 Units Subcutaneous Q2200  . multivitamin with minerals  1 tablet Oral Daily  . nystatin   Topical BID  . oxyCODONE  10 mg Oral Q12H  . pantoprazole  40 mg Oral Daily  . polyethylene glycol  17 g Oral BID  . Zinc Oxide   Topical BID   Continuous Infusions:   LOS: 8 days    Time spent: 25 minutes spent in the coordination of care today.    Jonnie Finner, DO [facility]  If 7PM-7AM, please contact night-coverage [email or web address] 04/03/2020, 2:19 PM

## 2020-04-04 ENCOUNTER — Ambulatory Visit
Admit: 2020-04-04 | Discharge: 2020-04-04 | Disposition: A | Discharge status: Home / Self Care | Payer: Medicare HMO | Attending: Radiation Oncology | Admitting: Radiation Oncology

## 2020-04-04 DIAGNOSIS — K5900 Constipation, unspecified: Secondary | ICD-10-CM

## 2020-04-04 DIAGNOSIS — D509 Iron deficiency anemia, unspecified: Secondary | ICD-10-CM

## 2020-04-04 LAB — CBC WITH DIFFERENTIAL/PLATELET
Abs Immature Granulocytes: 0.02 10*3/uL (ref 0.00–0.07)
Basophils Absolute: 0.1 10*3/uL (ref 0.0–0.1)
Basophils Relative: 2 %
Eosinophils Absolute: 0.4 10*3/uL (ref 0.0–0.5)
Eosinophils Relative: 8 %
HCT: 45.7 % (ref 36.0–46.0)
Hemoglobin: 14.1 g/dL (ref 12.0–15.0)
Immature Granulocytes: 0 %
Lymphocytes Relative: 38 %
Lymphs Abs: 2 10*3/uL (ref 0.7–4.0)
MCH: 29.4 pg (ref 26.0–34.0)
MCHC: 30.9 g/dL (ref 30.0–36.0)
MCV: 95.4 fL (ref 80.0–100.0)
Monocytes Absolute: 0.7 10*3/uL (ref 0.1–1.0)
Monocytes Relative: 13 %
Neutro Abs: 2.1 10*3/uL (ref 1.7–7.7)
Neutrophils Relative %: 39 %
Platelets: 163 10*3/uL (ref 150–400)
RBC: 4.79 MIL/uL (ref 3.87–5.11)
RDW: 20.2 % — ABNORMAL HIGH (ref 11.5–15.5)
WBC: 5.3 10*3/uL (ref 4.0–10.5)
nRBC: 0 % (ref 0.0–0.2)

## 2020-04-04 LAB — COMPREHENSIVE METABOLIC PANEL
ALT: 14 U/L (ref 0–44)
AST: 38 U/L (ref 15–41)
Albumin: 3.2 g/dL — ABNORMAL LOW (ref 3.5–5.0)
Alkaline Phosphatase: 122 U/L (ref 38–126)
Anion gap: 13 (ref 5–15)
BUN: 23 mg/dL (ref 8–23)
CO2: 29 mmol/L (ref 22–32)
Calcium: 9.8 mg/dL (ref 8.9–10.3)
Chloride: 98 mmol/L (ref 98–111)
Creatinine, Ser: 1.06 mg/dL — ABNORMAL HIGH (ref 0.44–1.00)
GFR, Estimated: 57 mL/min — ABNORMAL LOW (ref >60)
Glucose, Bld: 77 mg/dL (ref 70–99)
Potassium: 4.2 mmol/L (ref 3.5–5.1)
Sodium: 140 mmol/L (ref 135–145)
Total Bilirubin: 1.2 mg/dL (ref 0.3–1.2)
Total Protein: 7.7 g/dL (ref 6.5–8.1)

## 2020-04-04 LAB — GLUCOSE, CAPILLARY
Glucose-Capillary: 134 mg/dL — ABNORMAL HIGH (ref 70–99)
Glucose-Capillary: 93 mg/dL (ref 70–99)
Glucose-Capillary: 86 mg/dL (ref 70–99)
Glucose-Capillary: 128 mg/dL — ABNORMAL HIGH (ref 70–99)
Glucose-Capillary: 100 mg/dL — ABNORMAL HIGH (ref 70–99)

## 2020-04-04 LAB — MAGNESIUM: Magnesium: 2.5 mg/dL — ABNORMAL HIGH (ref 1.7–2.4)

## 2020-04-04 NOTE — Progress Notes (Signed)
   04/04/20 0615  What Happened  Was fall witnessed? Yes  Who witnessed fall? Dillon Bjork  Patients activity before fall other (comment) (BSC assisted)  Point of contact buttocks  Was patient injured? No  Follow Up  MD notified  Kennon Holter)  Time MD notified [phone]  Family notified No - patient refusal  Time family notified  (n/a; patient is alert and oriented)  Additional tests No  Simple treatment Other (comment) (n/a; no injuries)  Progress note created (see row info) Yes  Adult Fall Risk Assessment  Risk Factor Category (scoring not indicated) High fall risk per protocol (document High fall risk)  Age 69  Fall History: Fall within 6 months prior to admission 5  Elimination; Bowel and/or Urine Incontinence 2  Elimination; Bowel and/or Urine Urgency/Frequency 0  Medications: includes PCA/Opiates, Anti-convulsants, Anti-hypertensives, Diuretics, Hypnotics, Laxatives, Sedatives, and Psychotropics 5  Patient Care Equipment 1  Mobility-Assistance 2  Mobility-Gait 2  Mobility-Sensory Deficit 0  Altered awareness of immediate physical environment 0  Impulsiveness 0  Lack of understanding of one's physical/cognitive limitations 0  Total Score 18  Patient Fall Risk Level High fall risk  Adult Fall Risk Interventions  Required Bundle Interventions *See Row Information* High fall risk - low, moderate, and high requirements implemented  Additional Interventions PT/OT need assessed if change in mobility from baseline;Use of appropriate toileting equipment (bedpan, BSC, etc.)  Screening for Fall Injury Risk (To be completed on HIGH fall risk patients) - Assessing Need for Low Bed  Risk For Fall Injury- Low Bed Criteria None identified - Continue screening  Screening for Fall Injury Risk (To be completed on HIGH fall risk patients who do not meet crieteria for Low Bed) - Assessing Need for Floor Mats Only  Risk For Fall Injury- Criteria for Floor Mats None identified - No additional  interventions needed  Pain Assessment  Pain Scale 0-10  Pain Score 0  PCA/Epidural/Spinal Assessment  Respiratory Pattern Regular;Unlabored  Neurological  Neuro (WDL) X (unchanged from previous assessment. )  Level of Consciousness Alert  Orientation Level Oriented X4  Cognition Appropriate at baseline  Speech Clear  Motor Function/Sensation Assessment Grip;Dorsiflexion;Plantar flexion;Motor strength  R Hand Grip Present  L Hand Grip Present  R Foot Dorsiflexion Present;Moderate  L Foot Dorsiflexion Present;Moderate  R Foot Plantar Flexion Present;Moderate  L Foot Plantar Flexion Present;Moderate  RUE Motor Strength 5  LUE Motor Strength 5  RLE Motor Strength 4  LLE Motor Strength 4  Neuro Symptoms Tremors  Glasgow Coma Scale  Eye Opening 4  Best Verbal Response (NON-intubated) 5  Best Motor Response 6  Glasgow Coma Scale Score 15  Musculoskeletal  Musculoskeletal (WDL) X  Assistive Device Standard walker  Generalized Weakness Yes  Weight Bearing Restrictions No  Integumentary  Integumentary (WDL) X (Pt. is has been scratching from head to toe, bleeding/scabs)  Skin Color Appropriate for ethnicity  Skin Condition Dry  Skin Integrity Contact dermatitis;Excoriated (scratch marks)  Contact Dermatitis Location Abdomen;Breast  Contact Dermatitis Location Orientation Right;Left  Contact Dermatitis Intervention Cleansed;Other (Comment) (nystatin applied. )  Excoriated Location Other (Comment) (all over)  Excoriated Location Orientation Right;Left;Anterior;Posterior  Excoriated Intervention Cleansed;Other (Comment) (itching medication given, scratched areas cleaned)  Skin Turgor Non-tenting  Pain Assessment  Result of Injury  (n/a)  Pain Screening  Clinical Progression  (n/a; no complaints of pain or injury)  Pain Assessment  Work-Related Injury No

## 2020-04-04 NOTE — Assessment & Plan Note (Signed)
Continue statin. 

## 2020-04-04 NOTE — Assessment & Plan Note (Addendum)
-   completed abx; denies new Sx

## 2020-04-04 NOTE — Care Management Important Message (Signed)
Important Message  Patient Details IM Letter given to the Patient. Name: Tonya Dougherty MRN: [id number] Date of Birth: [birth year]   Medicare Important Message Given:  Yes     Kerin Salen 04/04/2020, 11:13 AM

## 2020-04-04 NOTE — Progress Notes (Signed)
PROGRESS NOTE    Tonya Dougherty   DVV:[id number]  DOB: 05/09/1950  DOA: 03/24/2020     9  PCP: Gay Filler, MD  CC: weakness, LLE pain  Hospital Course: Tonya Dougherty admitted to the hospital with a working diagnosis of ambulatory dysfunction due tometastatic adenocarcinoma(gastrointesitnal primary)at thesacrumon the left with invasion of S1 and S2 neural foramina.  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 Presents with progressive weakness and left buttock/thigh pain. Patient had a subacute left hip/buttock pain, that was refractory to outpatient management. Over the last 10 days prior to hospitalization she developed difficulty ambulating and progressive weakness in the left lower extremity. On her initial physical examination blood pressure 136/56, heart rate 87, respirate 15, oxygen saturation 95%, her lungs were clear to auscultation bilaterally, heart S1-S2, present rhythmic, soft abdomen and no lower extremity edema.  Thoracic lumbar MRI positive for hyperintense lesions involving the central and left aspect of the sacrum, concerning for osseous metastatic disease. Probably early invasion of the adjacent left S1-S2 neural foramina. Positive degenerative disc disease,  Further work-up with CT chest, abdomen pelvis showed destructive left sacral lesion with associated 2.8 x 3.5 cm soft tissue component anterior to the left sacrum worrisome for metastasis. 2.6 cm hypoenhancing mass in the right upper kidney.  CT-guided biopsies 11/29 of sacral lesion showing metastatic adenocarcinoma consistent with gastrointestinal primary.  She was transferred to [facility] for initiation of XRT. She will transfer to SNF after.   Plan is to get a couple of treatments of XRT in before discharge to SNF  Interval History:  Seen resting  in bed this morning still complaining of pain in her left lower extremity especially if lying on her left side.  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  - Biopsy positive for adenocarcinoma of intestinal origin. - Hx of XI robot assisted sigmoidectomy on 07/2019 per Dr Dema Severin at [facility], pathology was positive for invasive adenocarcinoma well differentiated, surgical margins were negative for carcinoma and no evidence of carcinoma in 11 of 11 lymph nodes. - onco (Dr. Betsy Coder) consulted and discussed the biopsy results with the patient and recommended that she be seen by radiation oncology later for consideration of palliative radiation to the left sacral mass. She was transferred to [facility] for radiation - Radiation oncology evaluating and recommending a 2-week course of daily palliative radiotherapy to the painful sacral metastasis and she underwent simulation and first radiation treatment this morning. They are going to resume radiation treatment starting on Monday, 04/04/2020. Radiation oncology would like to get a few treatments prior to the patient discharging to SNF - Dr. Benay Spice is going to submit the sacrum biopsy results for foundation 1 testing - Aggressive bowel regime with miralax and fleet enema. - PT/OT: recommending SNF     - 12/5: No acute events ON. Denies complaints. Continue XRT.   Acute Cystitis - completed abx; denies new Sx  HTN - continue coreg - 12/5: BP looks good. 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  - continuelocal skin care  Anxiety and Depression - continue citalopram  Hx of DVT - continue eliquis  Iron Deficiency Anemia - no signs of bleed; continue iron, follow  Antimicrobials:   DVT prophylaxis: Eliquis Code Status: Full Family Communication: None present Disposition Plan: Status is: Inpatient  Remains inpatient appropriate because:Ongoing active pain requiring inpatient pain management, IV treatments appropriate due to intensity of illness or inability to take PO and Inpatient level of care appropriate due to severity of illness   Dispo: The patient is from: Home              Anticipated d/c is to: SNF              Anticipated d/c date is: 3 days              Patient currently is not medically stable to d/c.       Objective: Blood pressure 124/60, pulse 73, temperature 97.6 F (36.4 C), temperature source Oral, resp. rate 14, height 5\' 2"  (1.575 m), weight 90 kg, SpO2 95 %.  Examination: General appearance: Adult woman lying in bed uncomfortable but no obvious distress Head: Normocephalic, without obvious abnormality, atraumatic Eyes: EOMI Lungs: clear to auscultation bilaterally Heart: regular rate and rhythm and S1, S2 normal Abdomen: normal findings: bowel sounds normal and soft, non-tender Extremities: Pain in left lower extremity with movement and hip Skin: mobility and turgor normal Neurologic: Moves all 4 extremities and follows commands.  Pain in left lower extremity with range of motion  Consultants:   Oncology  Radiation oncology   Procedures:     Data Reviewed: I have personally reviewed following labs and imaging studies Results for orders placed or performed during the hospital encounter of 03/24/20 (from the past 24 hour(s))  Glucose, capillary     Status: Abnormal   Collection Time:  04/03/20  5:24 PM  Result Value Ref Range   Glucose-Capillary 133 (H) 70 - 99 mg/dL   Comment 1 Notify RN    Comment 2 Document in Chart   Glucose, capillary     Status: Abnormal   Collection Time: 04/03/20 10:14 PM  Result Value Ref Range   Glucose-Capillary 134 (H) 70 - 99 mg/dL   Comment 1 Notify RN   CBC with Differential/Platelet     Status: Abnormal   Collection Time: 04/04/20  5:38 AM  Result Value Ref Range   WBC 5.3 4.0 - 10.5 K/uL   RBC 4.79 3.87 - 5.11 MIL/uL   Hemoglobin 14.1 12.0 - 15.0 g/dL   HCT 45.7 36 - 46 %   MCV 95.4 80.0 - 100.0 fL   MCH 29.4 26.0 - 34.0 pg   MCHC 30.9 30.0 - 36.0 g/dL   RDW 20.2 (H) 11.5 - 15.5 %   Platelets 163 150 - 400 K/uL   nRBC 0.0 0.0 - 0.2 %   Neutrophils Relative % 39 %   Neutro Abs 2.1 1.7 - 7.7 K/uL   Lymphocytes Relative 38 %   Lymphs Abs 2.0 0.7 - 4.0 K/uL   Monocytes Relative 13 %   Monocytes Absolute 0.7 0.1 - 1.0 K/uL   Eosinophils Relative 8 %   Eosinophils Absolute 0.4 0.0 - 0.5 K/uL   Basophils Relative 2 %   Basophils Absolute 0.1 0.0 - 0.1 K/uL   Immature Granulocytes 0 %   Abs Immature Granulocytes 0.02 0.00 - 0.07 K/uL  Comprehensive metabolic panel     Status: Abnormal   Collection Time: 04/04/20  5:38 AM  Result Value Ref Range   Sodium 140 135 - 145 mmol/L   Potassium 4.2 3.5 - 5.1 mmol/L   Chloride 98 98 - 111 mmol/L   CO2 29 22 - 32 mmol/L   Glucose, Bld 77 70 - 99 mg/dL   BUN 23 8 - 23 mg/dL   Creatinine, Ser 1.06 (H) 0.44 - 1.00 mg/dL   Calcium 9.8 8.9 - 10.3 mg/dL   Total Protein 7.7 6.5 - 8.1 g/dL   Albumin 3.2 (L) 3.5 - 5.0 g/dL   AST 38 15 - 41 U/L   ALT 14 0 - 44 U/L   Alkaline Phosphatase 122 38 - 126 U/L   Total Bilirubin 1.2 0.3 - 1.2 mg/dL   GFR, Estimated 57 (L) >60 mL/min   Anion gap 13 5 - 15  Magnesium     Status: Abnormal   Collection Time: 04/04/20  5:38 AM  Result Value Ref Range   Magnesium 2.5 (H) 1.7 - 2.4 mg/dL  Glucose, capillary     Status: None   Collection Time:  04/04/20  6:52 AM  Result Value Ref Range   Glucose-Capillary 93 70 - 99 mg/dL   Comment 1 Notify RN   Glucose, capillary     Status: None   Collection Time: 04/04/20 11:33 AM  Result Value Ref Range   Glucose-Capillary 86 70 - 99 mg/dL   Comment 1 Notify RN    Comment 2 Document in Chart   Glucose, capillary     Status: Abnormal   Collection Time: 04/04/20  4:26 PM  Result Value Ref Range   Glucose-Capillary 128 (H) 70 - 99 mg/dL   Comment 1 Notify RN    Comment 2 Document in Chart     No results found for this or any previous visit (from the past 240 hour(s)).   Radiology Studies: No results found. CT BIOPSY  Final Result    CT CHEST ABDOMEN PELVIS W CONTRAST  Final Result    MR LUMBAR SPINE WO CONTRAST  Final Result    MR THORACIC SPINE WO CONTRAST  Final Result    DG Hip Unilat W or Wo Pelvis 2-3 Views Left  Final Result    DG Knee Complete 4 Views Left  Final Result      Scheduled Meds: . apixaban  5 mg Oral BID  . carvedilol  3.125 mg Oral BID WC  . citalopram  30 mg Oral QHS  . feeding supplement  237 mL Oral BID BM  . ferrous sulfate  325 mg Oral Q breakfast  . gabapentin  600 mg Oral TID  . insulin aspart  0-15 Units Subcutaneous TID AC & HS  . insulin glargine  40 Units Subcutaneous Q2200  . multivitamin with minerals  1 tablet Oral Daily  . nystatin   Topical BID  . oxyCODONE  10 mg Oral Q12H  . pantoprazole  40 mg Oral Daily  . polyethylene glycol  17 g Oral BID  . Zinc Oxide   Topical BID   PRN Meds: acetaminophen **OR** acetaminophen, diphenhydrAMINE, fluticasone, HYDROmorphone (DILAUDID) injection, lip balm, ondansetron **OR** ondansetron (ZOFRAN) IV, oxyCODONE-acetaminophen, polyethylene glycol, polyvinyl alcohol, senna-docusate Continuous Infusions:   LOS: 9 days  Time spent: Greater than 50% of the 35 minute visit was spent in counseling/coordination of care for the patient as laid out in the A&P.   Dwyane Dee, MD [facility] 04/04/2020, 5:14 PM

## 2020-04-04 NOTE — Assessment & Plan Note (Addendum)
-   no signs of bleed; continue iron, follow

## 2020-04-04 NOTE — Assessment & Plan Note (Signed)
-   see adenocarcinoma - patient undergoing radiation for pain management

## 2020-04-04 NOTE — Progress Notes (Signed)
At 6 am this morning, patient called for assistance to [facility]. I assisted patient to [facility].  After patient used the bathroom she held on to her walker, while this RN performed pericare.  Patient all the sudden reported that her legs were "buckling". Patient was then held (around her waist/under her arms) and assisted to the floor.  Patient landed gently onto her buttocks.  She was then assisted into bed with the help of Marissa Calamity., NT ( and myself).  Patient complained of no pain or injury. Patient's assessment was unchanged from previous shift assessment. Her vitals were stable and CBG WNL.  Will continue to monitor.Roderick Pee

## 2020-04-04 NOTE — Assessment & Plan Note (Signed)
-  Continue local wound care

## 2020-04-04 NOTE — Assessment & Plan Note (Addendum)
-   Biopsy positive for adenocarcinoma of intestinal origin. - Hx of XI robot assisted sigmoidectomy on 07/2019 per Dr Dema Severin at [facility], pathology was positive for invasive adenocarcinoma well differentiated, surgical margins were negative for carcinoma and no evidence of carcinoma in 11 of 11 lymph nodes. - oncology (Dr. Benay Spice) consulted and discussed the biopsy results with the patient and recommended that she be seen by radiation oncology later for consideration of palliative radiation to the left sacral mass. She was transferred to [facility] for radiation - Radiation oncology evaluating and recommending a 2-week course of daily palliative radiotherapy to the painful sacral metastasis - Dr. Benay Spice is going to submit the sacrum biopsy results for foundation 1 testing - Aggressive bowel regimen with miralax and fleet enema. - PT/OT: recommending SNF; multiple offers but will not be able to discharge until radiation is completed

## 2020-04-04 NOTE — Hospital Course (Addendum)
Tonya Dougherty was admitted to the hospital with a working diagnosis of ambulatory dysfunction due to metastatic adenocarcinoma (gastrointesitnal primary) at the sacrum on the left with invasion of S1 and S2 neural foramina.   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  Presents with progressive weakness and left buttock/thigh pain. Patient had a subacute left hip/buttock pain, that was refractory to outpatient management.  Over the last 10 days prior to hospitalization she developed difficulty ambulating and progressive weakness in the left lower extremity.  On her initial physical examination blood pressure 136/56, heart rate 87, respirate 15, oxygen saturation 95%, her lungs were clear to auscultation bilaterally, heart S1-S2, present rhythmic, soft abdomen and no lower extremity edema.   Thoracic lumbar MRI positive for hyperintense lesions involving the central and left aspect of the sacrum, concerning for osseous metastatic disease.  Probably early invasion of the adjacent left S1-S2 neural foramina. Positive degenerative disc disease,   Further work-up with CT chest, abdomen pelvis showed destructive left sacral lesion with associated 2.8 x 3.5 cm soft tissue component anterior to the left sacrum worrisome for metastasis.  2.6 cm hypoenhancing mass in the right upper kidney.   CT-guided biopsies 11/29 of sacral lesion showing metastatic adenocarcinoma consistent with gastrointestinal primary.   She was transferred to [facility] for initiation of XRT. She will transfer to SNF after.  She has multiple bed offers but will not be accepted until radiation treatments are complete.

## 2020-04-04 NOTE — Assessment & Plan Note (Addendum)
-   lantus, SSI, DM diet, glucose checks; adjust as necessary

## 2020-04-04 NOTE — Assessment & Plan Note (Signed)
Continue nicotine patch  °

## 2020-04-04 NOTE — Assessment & Plan Note (Signed)
-  Continue wound care and turning often

## 2020-04-04 NOTE — Assessment & Plan Note (Signed)
Continue PPI ?

## 2020-04-04 NOTE — Assessment & Plan Note (Addendum)
continue coreg 

## 2020-04-04 NOTE — Progress Notes (Signed)
   04/04/20 0608  Vitals  Temp 98 F (36.7 C)  Temp Source Oral  BP (!) 126/94  MAP (mmHg) 106  BP Location Left Arm  BP Method Automatic  Patient Position (if appropriate) Lying  Pulse Rate 74  Pulse Rate Source Monitor  Resp 17  Oxygen Therapy  SpO2 96 %  O2 Device Room Air

## 2020-04-04 NOTE — Assessment & Plan Note (Signed)
-   Continue Eliquis 

## 2020-04-04 NOTE — Assessment & Plan Note (Signed)
Continue bowel regimen. ?

## 2020-04-05 ENCOUNTER — Ambulatory Visit
Admit: 2020-04-05 | Discharge: 2020-04-05 | Disposition: A | Discharge status: Home / Self Care | Payer: Medicare HMO | Attending: Radiation Oncology | Admitting: Radiation Oncology

## 2020-04-05 LAB — BASIC METABOLIC PANEL
Anion gap: 11 (ref 5–15)
BUN: 23 mg/dL (ref 8–23)
CO2: 30 mmol/L (ref 22–32)
Calcium: 9.5 mg/dL (ref 8.9–10.3)
Chloride: 99 mmol/L (ref 98–111)
Creatinine, Ser: 1 mg/dL (ref 0.44–1.00)
GFR, Estimated: >60 mL/min (ref >60)
Glucose, Bld: 121 mg/dL — ABNORMAL HIGH (ref 70–99)
Potassium: 3.9 mmol/L (ref 3.5–5.1)
Sodium: 140 mmol/L (ref 135–145)

## 2020-04-05 LAB — GLUCOSE, CAPILLARY
Glucose-Capillary: 102 mg/dL — ABNORMAL HIGH (ref 70–99)
Glucose-Capillary: 93 mg/dL (ref 70–99)

## 2020-04-05 LAB — CBC WITH DIFFERENTIAL/PLATELET
Abs Immature Granulocytes: 0.01 10*3/uL (ref 0.00–0.07)
Basophils Absolute: 0.1 10*3/uL (ref 0.0–0.1)
Basophils Relative: 2 %
Eosinophils Absolute: 0.4 10*3/uL (ref 0.0–0.5)
Eosinophils Relative: 8 %
HCT: 45 % (ref 36.0–46.0)
Hemoglobin: 13.7 g/dL (ref 12.0–15.0)
Immature Granulocytes: 0 %
Lymphocytes Relative: 37 %
Lymphs Abs: 1.9 10*3/uL (ref 0.7–4.0)
MCH: 29.5 pg (ref 26.0–34.0)
MCHC: 30.4 g/dL (ref 30.0–36.0)
MCV: 96.8 fL (ref 80.0–100.0)
Monocytes Absolute: 0.6 10*3/uL (ref 0.1–1.0)
Monocytes Relative: 12 %
Neutro Abs: 2.1 10*3/uL (ref 1.7–7.7)
Neutrophils Relative %: 41 %
Platelets: 169 10*3/uL (ref 150–400)
RBC: 4.65 MIL/uL (ref 3.87–5.11)
RDW: 20.4 % — ABNORMAL HIGH (ref 11.5–15.5)
WBC: 5.1 10*3/uL (ref 4.0–10.5)
nRBC: 0 % (ref 0.0–0.2)

## 2020-04-05 LAB — MAGNESIUM: Magnesium: 2.4 mg/dL (ref 1.7–2.4)

## 2020-04-05 NOTE — Progress Notes (Signed)
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  Presents with progressive weakness and left buttock/thigh pain. Patient had a subacute left hip/buttock pain, that was refractory to outpatient management.  Over the last 10 days prior to hospitalization she developed difficulty ambulating and progressive weakness in the left lower extremity.  On her initial physical examination blood pressure 136/56, heart rate 87, respirate 15, oxygen saturation 95%, her lungs were clear to auscultation bilaterally, heart S1-S2, present rhythmic, soft abdomen and no lower extremity edema.   Thoracic lumbar MRI positive for hyperintense lesions involving the central and left aspect of the sacrum, concerning for osseous metastatic disease.  Probably early invasion of the adjacent left S1-S2 neural foramina. Positive degenerative disc disease,   Further work-up with CT chest, abdomen pelvis showed destructive left sacral lesion with associated 2.8 x 3.5 cm soft tissue component anterior to the left sacrum worrisome for metastasis.  2.6 cm hypoenhancing mass in the right upper kidney.   CT-guided biopsies 11/29 of sacral lesion showing metastatic adenocarcinoma consistent with gastrointestinal primary.   She was transferred to [facility] for initiation of XRT. She will transfer to SNF after.    Plan is to get a couple of treatments of XRT in before discharge to SNF   Interval History:  No events overnight.   Underwent radiation treatment yesterday and states that her pain is a little better today.  She was seen resting in bed comfortably this morning in no distress.  Old records reviewed in assessment of this patient  ROS: Constitutional: negative for chills and fevers, Respiratory: negative for cough, Cardiovascular: negative for chest pain and Gastrointestinal: negative for abdominal pain  Assessment & Plan: * Adenocarcinoma of sigmoid colon ([place]) - Biopsy positive for adenocarcinoma of intestinal origin. - Hx of XI robot assisted sigmoidectomy on 07/2019 per Dr Dema Severin at [facility], pathology was positive for invasive adenocarcinoma well differentiated, surgical margins were negative for carcinoma and no evidence of carcinoma in 11 of 11 lymph nodes. - oncology (Dr. Betsy Coder) consulted and discussed the biopsy results with the patient and recommended that she be seen by radiation oncology later for consideration of palliative radiation to the left sacral mass. She was transferred to [facility] for radiation - Radiation oncology evaluating and recommending a 2-week course of daily palliative radiotherapy to the painful sacral metastasis and she underwent simulation and first radiation treatment this morning. They are going to resume radiation treatment starting on Monday, 04/04/2020. Radiation oncology would like to get a few treatments prior to the patient discharging to SNF - Dr. Benay Spice is going to submit the sacrum biopsy results for foundation 1 testing - Aggressive bowel regimen with miralax and fleet enema. - PT/OT: recommending SNF; awaiting bed  Bone metastasis ([place]) - see adenocarcinoma - patient undergoing radiation for pain management   Type 2 diabetes mellitus with hyperglycemia, with long-term current use of insulin (HCC) - lantus, SSI, DM diet, glucose checks; adjust as necessary  Iron deficiency anemia - no signs  of bleed; continue iron, follow  Constipation -Continue  bowel regimen  Sacral lesion -Continue wound care and turning often  Pressure injury of skin -Continue local wound care  History of DVT (deep vein thrombosis) -Continue Eliquis  Nicotine dependence, cigarettes, uncomplicated -Continue nicotine patch  Mixed diabetic hyperlipidemia associated with type 2 diabetes mellitus ([place]) -Continue statin  GERD without esophagitis -Continue PPI  Essential hypertension - continue coreg  Acute cystitis without hematuria - completed abx; denies new Sx    Antimicrobials:   DVT prophylaxis: Eliquis Code Status: [place] Family Communication: none present Disposition Plan: Status is: Inpatient  Remains inpatient appropriate because:IV treatments appropriate due to intensity of illness or inability to take PO and Inpatient level of care appropriate due to severity of illness   Dispo: The patient is from: Home              Anticipated d/c is to: SNF              Anticipated d/c date is: When bed available              Patient currently is medically stable to d/c.  Objective: Blood pressure (!) 143/69, pulse 73, temperature 97.8 F (36.6 C), temperature source Oral, resp. rate 17, height 5\' 2"  (1.575 m), weight 90 kg, SpO2 94 %.  Examination: General appearance: Adult woman lying in bed uncomfortable but no obvious distress Head: Normocephalic, without obvious abnormality, atraumatic Eyes: EOMI Lungs: clear to auscultation bilaterally Heart: regular rate and rhythm and S1, S2 normal Abdomen: normal findings: bowel sounds normal and soft, non-tender Extremities:  No edema.  Less pain with plantar flexion of left lower extremity Skin: mobility and turgor normal Neurologic: Moves all 4 extremities and follows commands.  Pain in left lower extremity with range of motion  Consultants:   Oncology  Radiation oncology  Procedures:     Data Reviewed: I have personally reviewed following labs and imaging studies Results for orders  placed or performed during the hospital encounter of 03/24/20 (from the past 24 hour(s))  Glucose, capillary     Status: Abnormal   Collection Time: 04/04/20  4:26 PM  Result Value Ref Range   Glucose-Capillary 128 (H) 70 - 99 mg/dL   Comment 1 Notify RN    Comment 2 Document in Chart   Glucose, capillary     Status: Abnormal   Collection Time: 04/04/20  9:42 PM  Result Value Ref Range   Glucose-Capillary 100 (H) 70 - 99 mg/dL  Basic metabolic panel     Status: Abnormal   Collection Time: 04/05/20  6:41 AM  Result Value Ref Range   Sodium 140 135 - 145 mmol/L   Potassium 3.9 3.5 - 5.1 mmol/L   Chloride 99 98 - 111 mmol/L   CO2 30 22 - 32 mmol/L   Glucose, Bld 121 (H) 70 - 99 mg/dL   BUN 23 8 - 23 mg/dL   Creatinine, Ser 1.00 0.44 - 1.00 mg/dL   Calcium 9.5 8.9 - 10.3 mg/dL   GFR, Estimated >60 >60 mL/min   Anion gap 11 5 - 15  CBC with Differential/Platelet     Status: Abnormal   Collection Time: 04/05/20  6:41 AM  Result Value Ref Range   WBC 5.1 4.0 - 10.5 K/uL   RBC 4.65 3.87 - 5.11 MIL/uL   Hemoglobin 13.7 12.0 - 15.0 g/dL   HCT 45.0 36 - 46 %   MCV 96.8 80.0 - 100.0 fL   MCH  29.5 26.0 - 34.0 pg   MCHC 30.4 30.0 - 36.0 g/dL   RDW 20.4 (H) 11.5 - 15.5 %   Platelets 169 150 - 400 K/uL   nRBC 0.0 0.0 - 0.2 %   Neutrophils Relative % 41 %   Neutro Abs 2.1 1.7 - 7.7 K/uL   Lymphocytes Relative 37 %   Lymphs Abs 1.9 0.7 - 4.0 K/uL   Monocytes Relative 12 %   Monocytes Absolute 0.6 0.1 - 1.0 K/uL   Eosinophils Relative 8 %   Eosinophils Absolute 0.4 0.0 - 0.5 K/uL   Basophils Relative 2 %   Basophils Absolute 0.1 0.0 - 0.1 K/uL   Immature Granulocytes 0 %   Abs Immature Granulocytes 0.01 0.00 - 0.07 K/uL  Magnesium     Status: None   Collection Time: 04/05/20  6:41 AM  Result Value Ref Range   Magnesium 2.4 1.7 - 2.4 mg/dL  Glucose, capillary     Status: Abnormal   Collection Time: 04/05/20 11:28 AM  Result Value Ref Range   Glucose-Capillary 102 (H) 70 - 99 mg/dL    Comment 1 Notify RN    Comment 2 Document in Chart     No results found for this or any previous visit (from the past 240 hour(s)).   Radiology Studies: No results found. CT BIOPSY  Final Result    CT CHEST ABDOMEN PELVIS W CONTRAST  Final Result    MR LUMBAR SPINE WO CONTRAST  Final Result    MR THORACIC SPINE WO CONTRAST  Final Result    DG Hip Unilat W or Wo Pelvis 2-3 Views Left  Final Result    DG Knee Complete 4 Views Left  Final Result      Scheduled Meds: . apixaban  5 mg Oral BID  . carvedilol  3.125 mg Oral BID WC  . citalopram  30 mg Oral QHS  . feeding supplement  237 mL Oral BID BM  . ferrous sulfate  325 mg Oral Q breakfast  . gabapentin  600 mg Oral TID  . insulin aspart  0-15 Units Subcutaneous TID AC & HS  . insulin glargine  40 Units Subcutaneous Q2200  . multivitamin with minerals  1 tablet Oral Daily  . nystatin   Topical BID  . oxyCODONE  10 mg Oral Q12H  . pantoprazole  40 mg Oral Daily  . polyethylene glycol  17 g Oral BID  . Zinc Oxide   Topical BID   PRN Meds: acetaminophen **OR** acetaminophen, diphenhydrAMINE, fluticasone, HYDROmorphone (DILAUDID) injection, lip balm, ondansetron **OR** ondansetron (ZOFRAN) IV, oxyCODONE-acetaminophen, polyethylene glycol, polyvinyl alcohol, senna-docusate Continuous Infusions:   LOS: 10 days  Time spent: Greater than 50% of the 35 minute visit was spent in counseling/coordination of care for the patient as laid out in the A&P.   Dwyane Dee, MD [facility] 04/05/2020, 4:01 PM

## 2020-04-05 NOTE — Progress Notes (Signed)
This nurse and Tech assisting patient to bed from bedside commode. Patient states that her knees buckled and then was lowered to the floor. No injuries noted, denies any pain. Yolanda Bonine was outside of door and notified of assistance to floor.

## 2020-04-05 NOTE — TOC Progression Note (Signed)
Transition of Care [facility]) - Progression Note    Patient Details  Name: Tonya Dougherty MRN: [id number] Date of Birth: [birth year]  Transition of Care [facility]) CM/SW Contact  Ananda Caya, Marjie Skiff, RN Phone Number: 04/05/2020, 2:16 PM  Clinical Narrative:    Pt continues to not have any SNF bed offers. Pt FL2 faxed out to a wider geographic range. Pt made aware and gives permission.   Expected Discharge Plan: [place] Barriers to Discharge: Continued Medical Work up  Expected Discharge Plan and Services Expected Discharge Plan: [place]   Discharge Planning Services: CM Consult Post Acute Care Choice: [place] Living arrangements for the past 2 months: Single Family Home                     Readmission Risk Interventions Readmission Risk Prevention Plan 03/30/2020  Transportation Screening Complete  PCP or Specialist Appt within 5-7 Days Complete  Home Care Screening Complete  Medication Review (RN CM) Complete  Some recent data might be hidden

## 2020-04-05 NOTE — Progress Notes (Signed)
Occupational Therapy Treatment Patient Details Name: Tonya Dougherty MRN: [id number] DOB: 1950-11-24 Today's Date: 04/05/2020    History of present illness Pt is a 69 y.o. female with a medical history significant for adenocarcinoma of sigmoid colon s/p resection 07/1738, nonalcoholic fatty liver disease, DM2, HTN, DVT (Eliquis), admitted 03/24/20 with progressive LLE pain radiating from L buttock to foot over the last 3 months that has gotten worse acutely. Imaging showed destructive L sacral lesion found to be metastatic adenocarcinoma.  Pt started on radiation treatments.   OT comments  OT treatment session with focus on ADL re-education, ADL transfers, and short-distance functional mobility with use of RW. Patient continues to be limited by decreased cognition often starting a sentence without finishing it, difficulty word finding, and decreased memory. Patient also limited by decreased static/dynamic standing balance, decreased safety awareness and general deconditioning. Patient would continue to benefit from skilled OT services to maximize independence with self-care tasks.    Follow Up Recommendations  SNF    Equipment Recommendations  3 in 1 bedside commode    Recommendations for Other Services      Precautions / Restrictions Precautions Precautions: Fall Restrictions Weight Bearing Restrictions: No       Mobility Bed Mobility Overal bed mobility: Needs Assistance Bed Mobility: Supine to Sit     Supine to sit: Min assist;HOB elevated     General bed mobility comments: Min A at trunk and hand held assist.   Transfers Overall transfer level: Needs assistance Equipment used: Rolling walker (2 wheeled) Transfers: Sit to/from [facility] Sit to Stand: Min guard Stand pivot transfers: Min assist            Balance Overall balance assessment: Needs assistance Sitting-balance support: Feet supported;No upper extremity supported Sitting balance-Leahy  Scale: Good Sitting balance - Comments: Patient able to wash BLE seated on BSC without LOB.    Standing balance support: Bilateral upper extremity supported;No upper extremity supported Standing balance-Leahy Scale: Fair Standing balance comment: LOB with washing/dressing LB in standing requiring assist to correct.                            ADL either performed or assessed with clinical judgement   ADL           Upper Body Bathing: Set up;Sitting Upper Body Bathing Details (indicate cue type and reason): Seated on BSC Lower Body Bathing: Minimal assistance;Sitting/lateral leans;Sit to/from stand Lower Body Bathing Details (indicate cue type and reason): Patient able to wash bilateral upper/lower legs with use of figure-4 position. Patient also able to wash front perineal area. Assist to wash buttocks only with cues for safety awareness in standing.  Upper Body Dressing : Set up;Sitting Upper Body Dressing Details (indicate cue type and reason): To don anterior hospital gown.  Lower Body Dressing: Moderate assistance;Sit to/from stand Lower Body Dressing Details (indicate cue type and reason): Patient able to don bilateral footwear with use of figure-4 position. Assist to thread LLE through underwear and to hike pants over hips in standing. Assist to steady with anterior LOB.  Toilet Transfer: Scientist, product/process development Details (indicate cue type and reason): Min A to BSC over standard toilet in bathroom. Cues for walker management.  Toileting- Clothing Manipulation and Hygiene: Moderate assistance;Sitting/lateral lean;Sit to/from stand Toileting - Clothing Manipulation Details (indicate cue type and reason): Mod A for hygiene/clothing management in standing. Anterior LOB requiring assist to correct.      Functional  mobility during ADLs: Minimal assistance;Rolling walker       Vision       Perception     Praxis      Cognition Arousal/Alertness:  Awake/alert Behavior During Therapy: WFL for tasks assessed/performed Overall Cognitive Status: No family/caregiver present to determine baseline cognitive functioning Area of Impairment: Safety/judgement;Following commands                       Following Commands: Follows multi-step commands inconsistently;Follows one step commands consistently Safety/Judgement: Decreased awareness of safety     General Comments: Restless/impulsive once standing. Patient demonstrates decreased memory and internal distraction.         Exercises     Shoulder Instructions       General Comments Skin breakdown in perineal area 2/2 purwick. RN notified.     Pertinent Vitals/ Pain       Pain Assessment: No/denies pain Pain Intervention(s): Monitored during session  Home Living                                          Prior Functioning/Environment              Frequency  Min 2X/week        Progress Toward Goals  OT Goals(current goals can now be found in the care plan section)  Progress towards OT goals: Progressing toward goals  Acute Rehab OT Goals Patient Stated Goal: to go home OT Goal Formulation: With patient Time For Goal Achievement: 04/09/20 Potential to Achieve Goals: Good ADL Goals Pt Will Perform Grooming: with modified independence;standing Pt Will Perform Upper Body Bathing: with modified independence;sitting;standing Pt Will Perform Lower Body Bathing: with modified independence;sitting/lateral leans;sit to/from stand Pt Will Perform Upper Body Dressing: with modified independence;sitting;standing Pt Will Perform Lower Body Dressing: with modified independence;sitting/lateral leans;sit to/from stand Pt Will Transfer to Toilet: with modified independence;ambulating;bedside commode Pt Will Perform Toileting - Clothing Manipulation and hygiene: with modified independence;sitting/lateral leans;sit to/from stand Pt Will Perform Tub/Shower  Transfer: Tub transfer;Shower transfer;with modified independence;ambulating;shower seat;rolling walker Pt/caregiver will Perform Home Exercise Program: Increased ROM;Increased strength;Both right and left upper extremity;With Supervision Additional ADL Goal #1: Pt will verbalize/demonstrate 3 fall prevention strategies and 3 energy conservation strategies to incorporate into ADLs/ADL mobility to increase safety awareness and activity tolerance. Additional ADL Goal #2: OT will administer formal cognitive assessment (e.g. MOCA) and set goals accordingly.  Plan Discharge plan remains appropriate    Co-evaluation                 AM-PAC OT "6 Clicks" Daily Activity     Outcome Measure   Help from another person eating meals?: None Help from another person taking care of personal grooming?: A Little Help from another person toileting, which includes using toliet, bedpan, or urinal?: A Lot Help from another person bathing (including washing, rinsing, drying)?: A Lot Help from another person to put on and taking off regular upper body clothing?: A Little Help from another person to put on and taking off regular lower body clothing?: A Lot 6 Click Score: 16    End of Session Equipment Utilized During Treatment: Gait belt;Rolling walker  OT Visit Diagnosis: Unsteadiness on feet (R26.81);Repeated falls (R29.6);Pain;Muscle weakness (generalized) (M62.81)   Activity Tolerance Patient tolerated treatment well   Patient Left in chair;with call bell/phone within reach;with chair alarm set   Nurse  Communication          Time: 9794-9971 OT Time Calculation (min): 29 min  Charges: OT General Charges $OT Visit: 1 Visit OT Treatments $Self Care/Home Management : 23-37 mins  Rianna Lukes H. OTR/L Supplemental OT, Department of rehab services [phone]  Analuisa Tudor R H. 04/05/2020, 3:11 PM

## 2020-04-05 NOTE — Progress Notes (Signed)
Nutrition Follow-up  DOCUMENTATION CODES:   Morbid obesity  INTERVENTION:   -Continue Ensure Enlive po BID, each supplement provides 350 kcal and 20 grams of protein -Continue MVI with minerals daily  NUTRITION DIAGNOSIS:   Increased nutrient needs related to cancer and cancer related treatments as evidenced by estimated needs.  Ongoing.  GOAL:   Patient will meet greater than or equal to 90% of their needs  Progressing.  MONITOR:   PO intake, Supplement acceptance, Labs, Weight trends, Skin, I & O's  ASSESSMENT:   69 year old female with past medical history of hypertension, nonalcoholic fatty liver disease, hyperlipidemia, diabetes mellitus type 2, gastroesophageal reflux disease, benign essential tremor and adenocarcinoma of the sigmoid colon (resected 07/2019) who presents to [facility] emergency department with complaints of progressive weakness as well as left buttock and thigh pain.  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  Currently pt is consuming 100% of meals. Drinking supplements.  I/Os: +485 ml since admit  Admission weight: 246 lbs. Current weight: 198 lbs.  Medications: Ferrous sulfate, Multivitamin with minerals daily, Miralax   Labs reviewed:  CBGs: 100-102  Diet Order:   Diet Order            Diet Carb Modified Fluid consistency: Thin; Room service appropriate? Yes  Diet effective now                 EDUCATION NEEDS:   No education needs have been identified at this time  Skin:  Skin Assessment: Skin  Integrity Issues: Skin Integrity Issues:: Incisions Stage II: sacrum Incisions: lt sacrum  Last BM:  03/26/20  Height:   Ht Readings from Last 1 Encounters:  04/01/20 5\' 2"  (1.575 m)    Weight:   Wt Readings from Last 1 Encounters:  04/01/20 90 kg   BMI:  Body mass index is 36.29 kg/m.  Estimated Nutritional Needs:   Kcal:  2000-2200  Protein:  110-125 grams  Fluid:  > 2 L  Clayton Bibles, MS, RD, LDN Inpatient Clinical Dietitian Contact information available via Amion

## 2020-04-05 NOTE — Progress Notes (Signed)
Email sent to Landry Mellow at [facility] pathology requesting Foundation One Testing on [phone] per Dr. Gearldine Shown request.

## 2020-04-06 ENCOUNTER — Ambulatory Visit
Admit: 2020-04-06 | Discharge: 2020-04-06 | Disposition: A | Discharge status: Home / Self Care | Payer: Medicare HMO | Attending: Radiation Oncology | Admitting: Radiation Oncology

## 2020-04-06 LAB — MAGNESIUM: Magnesium: 2.6 mg/dL — ABNORMAL HIGH (ref 1.7–2.4)

## 2020-04-06 LAB — CBC WITH DIFFERENTIAL/PLATELET
Abs Immature Granulocytes: 0 10*3/uL (ref 0.00–0.07)
Basophils Absolute: 0.1 10*3/uL (ref 0.0–0.1)
Basophils Relative: 2 %
Eosinophils Absolute: 0.3 10*3/uL (ref 0.0–0.5)
Eosinophils Relative: 6 %
HCT: 45.2 % (ref 36.0–46.0)
Hemoglobin: 13.7 g/dL (ref 12.0–15.0)
Immature Granulocytes: 0 %
Lymphocytes Relative: 28 %
Lymphs Abs: 1.7 10*3/uL (ref 0.7–4.0)
MCH: 29.1 pg (ref 26.0–34.0)
MCHC: 30.3 g/dL (ref 30.0–36.0)
MCV: 96.2 fL (ref 80.0–100.0)
Monocytes Absolute: 0.7 10*3/uL (ref 0.1–1.0)
Monocytes Relative: 12 %
Neutro Abs: 3.1 10*3/uL (ref 1.7–7.7)
Neutrophils Relative %: 52 %
Platelets: 175 10*3/uL (ref 150–400)
RBC: 4.7 MIL/uL (ref 3.87–5.11)
RDW: 20.4 % — ABNORMAL HIGH (ref 11.5–15.5)
WBC: 5.9 10*3/uL (ref 4.0–10.5)
nRBC: 0 % (ref 0.0–0.2)

## 2020-04-06 LAB — GLUCOSE, CAPILLARY
Glucose-Capillary: 102 mg/dL — ABNORMAL HIGH (ref 70–99)
Glucose-Capillary: 125 mg/dL — ABNORMAL HIGH (ref 70–99)
Glucose-Capillary: 111 mg/dL — ABNORMAL HIGH (ref 70–99)
Glucose-Capillary: 116 mg/dL — ABNORMAL HIGH (ref 70–99)

## 2020-04-06 LAB — BASIC METABOLIC PANEL
Anion gap: 10 (ref 5–15)
BUN: 25 mg/dL — ABNORMAL HIGH (ref 8–23)
CO2: 30 mmol/L (ref 22–32)
Calcium: 9.4 mg/dL (ref 8.9–10.3)
Chloride: 100 mmol/L (ref 98–111)
Creatinine, Ser: 0.99 mg/dL (ref 0.44–1.00)
GFR, Estimated: >60 mL/min (ref >60)
Glucose, Bld: 86 mg/dL (ref 70–99)
Potassium: 4.1 mmol/L (ref 3.5–5.1)
Sodium: 140 mmol/L (ref 135–145)

## 2020-04-06 NOTE — Progress Notes (Signed)
Provided support with Laytoya and family around Regulatory affairs officer.   Pt expresses understanding.  Does not know some information, so requests to notarize at later time.

## 2020-04-06 NOTE — Progress Notes (Signed)
PROGRESS NOTE    Tonya Dougherty   WYO:[id number]  DOB: 14-Nov-1950  DOA: 03/24/2020     11  PCP: Gay Filler, MD  CC: back pain  Hospital Course: Mrs. 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  Presents with progressive weakness and left buttock/thigh pain. Patient had a subacute left hip/buttock pain, that was refractory to outpatient management.  Over the last 10 days prior to hospitalization she developed difficulty ambulating and progressive weakness in the left lower extremity.  On her initial physical examination blood pressure 136/56, heart rate 87, respirate 15, oxygen saturation 95%, her lungs were clear to auscultation bilaterally, heart S1-S2, present rhythmic, soft abdomen and no lower extremity edema.   Thoracic lumbar MRI positive for hyperintense lesions involving the central and left aspect of the sacrum, concerning for osseous metastatic disease.  Probably early invasion of the adjacent left S1-S2 neural foramina. Positive degenerative disc disease,   Further work-up with CT chest, abdomen pelvis showed destructive left sacral lesion with associated 2.8 x 3.5 cm soft tissue component anterior to the left sacrum worrisome for metastasis.  2.6 cm hypoenhancing mass in the right upper kidney.   CT-guided biopsies 11/29 of sacral lesion showing metastatic adenocarcinoma consistent with gastrointestinal primary.   She was transferred to [facility] for initiation of XRT. She will transfer to SNF after.    Plan is to get a couple of treatments of XRT in before discharge to SNF   Interval History:  No events overnight.   Patient's sister was present bedside this morning.  We discussed her case together and that we were waiting on a bed for placement.  Also discussed the barriers to discharge regarding ongoing radiation.  Patient and sister voiced understanding.  We will work on discharging once a facility is found.  Old records reviewed in assessment of this patient  ROS: Constitutional: negative for chills and fevers, Respiratory: negative for cough, Cardiovascular: negative for chest pain and Gastrointestinal: negative for abdominal pain  Assessment & Plan: * Adenocarcinoma of sigmoid colon ([place]) - Biopsy positive for adenocarcinoma of intestinal origin. - Hx of XI robot assisted sigmoidectomy on 07/2019 per Dr Dema Severin at [facility], pathology was positive for invasive adenocarcinoma well differentiated, surgical margins were negative for carcinoma and no evidence of carcinoma in 11 of 11 lymph nodes. - oncology (Dr. Betsy Coder) consulted and discussed the biopsy results with the patient and recommended that she be seen by radiation oncology later for consideration of palliative radiation to the left sacral mass. She was transferred to [facility] for radiation - Radiation oncology evaluating and recommending a 2-week course of daily palliative radiotherapy to the painful sacral metastasis and she underwent simulation and first radiation treatment this morning. They are going to resume radiation treatment starting on Monday, 04/04/2020. Radiation oncology would like to get a few treatments prior to the patient discharging to SNF - Dr. Benay Spice is going to submit the sacrum biopsy results for foundation 1 testing - Aggressive bowel regimen with miralax and fleet enema. - PT/OT: recommending SNF; awaiting bed  Bone metastasis ([place]) - see adenocarcinoma - patient undergoing radiation for pain management   Type 2 diabetes mellitus with hyperglycemia, with  long-term current use of insulin (HCC) - lantus, SSI, DM  diet, glucose checks; adjust as necessary  Iron deficiency anemia - no signs of bleed; continue iron, follow  Constipation -Continue bowel regimen  Sacral lesion -Continue wound care and turning often  Pressure injury of skin -Continue local wound care  History of DVT (deep vein thrombosis) -Continue Eliquis  Nicotine dependence, cigarettes, uncomplicated -Continue nicotine patch  Mixed diabetic hyperlipidemia associated with type 2 diabetes mellitus ([place]) -Continue statin  GERD without esophagitis -Continue PPI  Essential hypertension - continue coreg  Acute cystitis without hematuria - completed abx; denies new Sx   Antimicrobials:   DVT prophylaxis: Eliquis Code Status: [place] Family Communication: none present Disposition Plan: Status is: Inpatient  Remains inpatient appropriate because:IV treatments appropriate due to intensity of illness or inability to take PO and Inpatient level of care appropriate due to severity of illness   Dispo: The patient is from: Home              Anticipated d/c is to: SNF              Anticipated d/c date is: When bed available              Patient currently is medically stable to d/c.  Objective: Blood pressure (!) 128/104, pulse 85, temperature 97.6 F (36.4 C), temperature source Oral, resp. rate 12, height 5\' 2"  (1.575 m), weight 90 kg, SpO2 99 %.  Examination: General appearance: Adult woman lying in bed uncomfortable but no obvious distress Head: Normocephalic, without obvious abnormality, atraumatic Eyes: EOMI Lungs: clear to auscultation bilaterally Heart: regular rate and rhythm and S1, S2 normal Abdomen: normal findings: bowel sounds normal and soft, non-tender Extremities:  No edema.  Less pain with plantar flexion of left lower extremity Skin: mobility and turgor normal Neurologic: Moves all 4 extremities and follows commands.  Pain in left lower extremity with range of motion  Consultants:    Oncology  Radiation oncology  Procedures:     Data Reviewed: I have personally reviewed following labs and imaging studies Results for orders placed or performed during the hospital encounter of 03/24/20 (from the past 24 hour(s))  Glucose, capillary     Status: None   Collection Time: 04/05/20  9:05 PM  Result Value Ref Range   Glucose-Capillary 93 70 - 99 mg/dL  Basic metabolic panel     Status: Abnormal   Collection Time: 04/06/20  7:12 AM  Result Value Ref Range   Sodium 140 135 - 145 mmol/L   Potassium 4.1 3.5 - 5.1 mmol/L   Chloride 100 98 - 111 mmol/L   CO2 30 22 - 32 mmol/L   Glucose, Bld 86 70 - 99 mg/dL   BUN 25 (H) 8 - 23 mg/dL   Creatinine, Ser 0.99 0.44 - 1.00 mg/dL   Calcium 9.4 8.9 - 10.3 mg/dL   GFR, Estimated >60 >60 mL/min   Anion gap 10 5 - 15  CBC with Differential/Platelet     Status: Abnormal   Collection Time: 04/06/20  7:12 AM  Result Value Ref Range   WBC 5.9 4.0 - 10.5 K/uL   RBC 4.70 3.87 - 5.11 MIL/uL   Hemoglobin 13.7 12.0 - 15.0 g/dL   HCT 45.2 36 - 46 %   MCV 96.2 80.0 - 100.0 fL   MCH 29.1 26.0 - 34.0 pg   MCHC 30.3 30.0 - 36.0 g/dL   RDW 20.4 (H) 11.5 - 15.5 %   Platelets  175 150 - 400 K/uL   nRBC 0.0 0.0 - 0.2 %   Neutrophils Relative % 52 %   Neutro Abs 3.1 1.7 - 7.7 K/uL   Lymphocytes Relative 28 %   Lymphs Abs 1.7 0.7 - 4.0 K/uL   Monocytes Relative 12 %   Monocytes Absolute 0.7 0.1 - 1.0 K/uL   Eosinophils Relative 6 %   Eosinophils Absolute 0.3 0.0 - 0.5 K/uL   Basophils Relative 2 %   Basophils Absolute 0.1 0.0 - 0.1 K/uL   Immature Granulocytes 0 %   Abs Immature Granulocytes 0.00 0.00 - 0.07 K/uL  Magnesium     Status: Abnormal   Collection Time: 04/06/20  7:12 AM  Result Value Ref Range   Magnesium 2.6 (H) 1.7 - 2.4 mg/dL  Glucose, capillary     Status: Abnormal   Collection Time: 04/06/20  7:24 AM  Result Value Ref Range   Glucose-Capillary 102 (H) 70 - 99 mg/dL  Glucose, capillary     Status: Abnormal    Collection Time: 04/06/20 11:49 AM  Result Value Ref Range   Glucose-Capillary 125 (H) 70 - 99 mg/dL    No results found for this or any previous visit (from the past 240 hour(s)).   Radiology Studies: No results found. CT BIOPSY  Final Result    CT CHEST ABDOMEN PELVIS W CONTRAST  Final Result    MR LUMBAR SPINE WO CONTRAST  Final Result    MR THORACIC SPINE WO CONTRAST  Final Result    DG Hip Unilat W or Wo Pelvis 2-3 Views Left  Final Result    DG Knee Complete 4 Views Left  Final Result      Scheduled Meds: . apixaban  5 mg Oral BID  . carvedilol  3.125 mg Oral BID WC  . citalopram  30 mg Oral QHS  . feeding supplement  237 mL Oral BID BM  . ferrous sulfate  325 mg Oral Q breakfast  . gabapentin  600 mg Oral TID  . insulin aspart  0-15 Units Subcutaneous TID AC & HS  . insulin glargine  40 Units Subcutaneous Q2200  . multivitamin with minerals  1 tablet Oral Daily  . nystatin   Topical BID  . oxyCODONE  10 mg Oral Q12H  . pantoprazole  40 mg Oral Daily  . polyethylene glycol  17 g Oral BID  . Zinc Oxide   Topical BID   PRN Meds: acetaminophen **OR** acetaminophen, diphenhydrAMINE, fluticasone, HYDROmorphone (DILAUDID) injection, lip balm, ondansetron **OR** ondansetron (ZOFRAN) IV, oxyCODONE-acetaminophen, polyethylene glycol, polyvinyl alcohol, senna-docusate Continuous Infusions:   LOS: 11 days  Time spent: Greater than 50% of the 35 minute visit was spent in counseling/coordination of care for the patient as laid out in the A&P.   Dwyane Dee, MD [facility] 04/06/2020, 4:43 PM

## 2020-04-06 NOTE — Progress Notes (Signed)
Physical Therapy Treatment Patient Details Name: Tonya Dougherty MRN: [id number] DOB: [birth year] Today's Date: 04/06/2020    History of Present Illness Pt is a 69 y.o. female with a medical history significant for adenocarcinoma of sigmoid colon s/p resection 11/5275, nonalcoholic fatty liver disease, DM2, HTN, DVT (Eliquis), admitted 03/24/20 with progressive LLE pain radiating from L buttock to foot over the last 3 months that has gotten worse acutely. Imaging showed destructive L sacral lesion found to be metastatic adenocarcinoma.  Pt started on radiation treatments.    PT Comments    Noted pt was lowered to the floor when her knees buckled with nursing yesterday. Pt had poor sitting balance today, she had loss of balance when she didn't have BUE/LEs supported. Pt tremulous, decreased coordination, dropped bottle of water as she attempted to take a drink. Performed seated BUE/LE strengthening exercises. Min assist for bed to recliner transfer. Did not attempt ambulation due to decline in balance and recent fall yesterday.    Follow Up Recommendations  SNF;Supervision/Assistance - 24 hour     Equipment Recommendations  3in1 (PT);Wheelchair (measurements PT);Wheelchair cushion (measurements PT)    Recommendations for Other Services       Precautions / Restrictions Precautions Precautions: Fall Precaution Comments: "lowered to floor" when knees buckled with nursing on 04/05/20 Restrictions Weight Bearing Restrictions: No    Mobility  Bed Mobility Overal bed mobility: Needs Assistance Bed Mobility: Supine to Sit     Supine to sit: HOB elevated;Supervision     General bed mobility comments: used rail, HOB up  Transfers Overall transfer level: Needs assistance Equipment used: Rolling walker (2 wheeled) Transfers: Sit to/from [facility] Sit to Stand: Min assist Stand pivot transfers: Min assist       General transfer comment: min A to power up and to steady  during pivot to recliner. Pt tremulous. Noted pt fell yesterday with nursing, her knees buckled and was lowered to floor. Did not attempt ambulation today.  Ambulation/Gait                 Stairs             Wheelchair Mobility    Modified Rankin (Stroke Patients Only)       Balance Overall balance assessment: Needs assistance Sitting-balance support: Feet supported;No upper extremity supported Sitting balance-Leahy Scale: Poor Sitting balance - Comments: intermittently loses balance in multiple directions when UEs aren't supported   Standing balance support: Bilateral upper extremity supported;No upper extremity supported Standing balance-Leahy Scale: Poor                              Cognition Arousal/Alertness: Awake/alert Behavior During Therapy: WFL for tasks assessed/performed Overall Cognitive Status: No family/caregiver present to determine baseline cognitive functioning Area of Impairment: Safety/judgement;Following commands;Memory                       Following Commands: Follows multi-step commands inconsistently;Follows one step commands consistently Safety/Judgement: Decreased awareness of safety     General Comments: Tremulous at rest. Dropped a bottle of water when attempting to take a drink. Patient demonstrates decreased memory and internal distraction.      Exercises General Exercises - Upper Extremity Shoulder Flexion: AROM;Both;5 reps;Seated General Exercises - Lower Extremity Long Arc Quad: AROM;Both;10 reps;Seated Hip Flexion/Marching: AROM;Both;5 reps;Seated    General Comments        Pertinent Vitals/Pain Pain Assessment: No/denies pain  Home Living                      Prior Function            PT Goals (current goals can now be found in the care plan section) Acute Rehab PT Goals Patient Stated Goal: to go home PT Goal Formulation: With patient Time For Goal Achievement:  04/08/20 Potential to Achieve Goals: Fair Progress towards PT goals: Not progressing toward goals - comment (poor balance, recent fall limiting progress)    Frequency    Min 2X/week      PT Plan Current plan remains appropriate    Co-evaluation              AM-PAC PT "6 Clicks" Mobility   Outcome Measure  Help needed turning from your back to your side while in a flat bed without using bedrails?: A Little Help needed moving from lying on your back to sitting on the side of a flat bed without using bedrails?: A Little Help needed moving to and from a bed to a chair (including a wheelchair)?: A Little Help needed standing up from a chair using your arms (e.g., wheelchair or bedside chair)?: A Little Help needed to walk in hospital room?: A Lot Help needed climbing 3-5 steps with a railing? : Total 6 Click Score: 15    End of Session Equipment Utilized During Treatment: Gait belt Activity Tolerance: Patient tolerated treatment well Patient left: with chair alarm set;in chair;with call bell/phone within reach Nurse Communication: Mobility status;Need for lift equipment (pt needs new purewick, pt had removed hers, bed is soiled) PT Visit Diagnosis: Unsteadiness on feet (R26.81);Muscle weakness (generalized) (M62.81);Other abnormalities of gait and mobility (R26.89)     Time: 4944-9675 PT Time Calculation (min) (ACUTE ONLY): 13 min  Charges:  $Therapeutic Activity: 8-22 mins                    Blondell Reveal Kistler PT 04/06/2020  Acute Rehabilitation Services Pager [phone] Office [phone]

## 2020-04-07 ENCOUNTER — Ambulatory Visit
Admit: 2020-04-07 | Discharge: 2020-04-07 | Disposition: A | Discharge status: Home / Self Care | Payer: Medicare HMO | Attending: Radiation Oncology | Admitting: Radiation Oncology

## 2020-04-07 LAB — GLUCOSE, CAPILLARY
Glucose-Capillary: 98 mg/dL (ref 70–99)
Glucose-Capillary: 153 mg/dL — ABNORMAL HIGH (ref 70–99)
Glucose-Capillary: 133 mg/dL — ABNORMAL HIGH (ref 70–99)
Glucose-Capillary: 100 mg/dL — ABNORMAL HIGH (ref 70–99)

## 2020-04-07 NOTE — Progress Notes (Signed)
Occupational Therapy Treatment Patient Details Name: Tonya Dougherty MRN: [id number] DOB: 12-21-1950 Today's Date: 04/07/2020    History of present illness Pt is a 69 y.o. female with a medical history significant for adenocarcinoma of sigmoid colon s/p resection 10/8586, nonalcoholic fatty liver disease, DM2, HTN, DVT (Eliquis), admitted 03/24/20 with progressive LLE pain radiating from L buttock to foot over the last 3 months that has gotten worse acutely. Imaging showed destructive L sacral lesion found to be metastatic adenocarcinoma.  Pt started on radiation treatments.   OT comments  Per RN patient has fallen twice this week with nursing and had to use hoyer lift to get back from chair yesterday afternoon. Patient does demonstrate restlessness and impulsivity during session, requiring multiple cues to stay seated EOB until OT ready with BSC. Patient mod A x2 stand pivot to commode, total A in standing due to decreased balance for peri care and mod A x1 with rolling walker back to bed with max cues for sequencing. Upon return to bed patient had another stool requiring total A for peri care and min A for rolling for LE management to avoid stool.    Follow Up Recommendations  SNF    Equipment Recommendations  3 in 1 bedside commode       Precautions / Restrictions Precautions Precautions: Fall Precaution Comments: has fallen twice with nursing this week 12/9       Mobility Bed Mobility Overal bed mobility: Needs Assistance Bed Mobility: Supine to Sit;Sit to Supine;Rolling Rolling: Min assist   Supine to sit: Min assist;HOB elevated Sit to supine: Min assist   General bed mobility comments: min A for LE management in rolling due to stool, min A for safety with trunk control to sitting and for guiding LEs back onto EOB  Transfers Overall transfer level: Needs assistance Equipment used: Rolling walker (2 wheeled);None Transfers: Risk manager;Sit to/from Stand Sit to Stand:  Mod assist Stand pivot transfers: Mod assist;+2 safety/equipment       General transfer comment: please see toilet transfer in ADL section    Balance Overall balance assessment: Needs assistance Sitting-balance support: Feet supported;Bilateral upper extremity supported Sitting balance-Leahy Scale: Poor     Standing balance support: Bilateral upper extremity supported;No upper extremity supported;During functional activity Standing balance-Leahy Scale: Poor Standing balance comment: reliant on external support                           ADL either performed or assessed with clinical judgement   ADL Overall ADL's : Needs assistance/impaired     Grooming: Wash/dry face;Wash/dry hands;Set up;Sitting                   Toilet Transfer: Moderate assistance;+2 for safety/equipment;Stand-pivot;Cueing for safety;Cueing for sequencing;BSC;RW Toilet Transfer Details (indicate cue type and reason): to toilet mod A x2 stand pivot without RW patient tremulous and require cues for hand placement. Patient mod A x1 to power up to standing from commode, difficulty sequencing turning towards EOB with walker Toileting- Clothing Manipulation and Hygiene: Total assistance;Bed level;Sit to/from stand Toileting - Clothing Manipulation Details (indicate cue type and reason): total A in standing for perianal care, upon return to bed patient had additional BM requiring total A in sidelying for perianal care     Functional mobility during ADLs: Moderate assistance;+2 for safety/equipment;Cueing for safety;Cueing for sequencing;Rolling walker General ADL Comments: patient tremulous with all mobility patient states "I always do that" patient appears to need fluctuating  levels of assist requiring mod A x1-2 for [facility] transfer and RN reporting need for hoyer lift from chair to bed yesterday afternoon.               Cognition Arousal/Alertness: Awake/alert Behavior During Therapy:  Restless;Impulsive Overall Cognitive Status: No family/caregiver present to determine baseline cognitive functioning                                 General Comments: patient requiring multiple cues not to initiate standing before therapy ready                   Pertinent Vitals/ Pain       Pain Assessment: Faces Faces Pain Scale: No hurt         Frequency  Min 2X/week        Progress Toward Goals  OT Goals(current goals can now be found in the care plan section)  Progress towards OT goals: Progressing toward goals  Acute Rehab OT Goals Patient Stated Goal: to go home OT Goal Formulation: With patient Time For Goal Achievement: 04/21/20 Potential to Achieve Goals: Good ADL Goals Pt Will Perform Grooming: with modified independence;standing Pt Will Perform Upper Body Bathing: with modified independence;sitting;standing Pt Will Perform Lower Body Bathing: with modified independence;sitting/lateral leans;sit to/from stand Pt Will Perform Upper Body Dressing: with modified independence;sitting;standing Pt Will Perform Lower Body Dressing: with modified independence;sitting/lateral leans;sit to/from stand Pt Will Transfer to Toilet: with modified independence;ambulating;bedside commode Pt Will Perform Toileting - Clothing Manipulation and hygiene: with modified independence;sitting/lateral leans;sit to/from stand Pt Will Perform Tub/Shower Transfer: Tub transfer;Shower transfer;with modified independence;ambulating;shower seat;rolling walker Pt/caregiver will Perform Home Exercise Program: Increased ROM;Increased strength;Both right and left upper extremity;With Supervision Additional ADL Goal #1: Pt will verbalize/demonstrate 3 fall prevention strategies and 3 energy conservation strategies to incorporate into ADLs/ADL mobility to increase safety awareness and activity tolerance. Additional ADL Goal #2: OT will administer formal cognitive assessment (e.g.  MOCA) and set goals accordingly.  Plan Discharge plan remains appropriate       AM-PAC OT "6 Clicks" Daily Activity     Outcome Measure   Help from another person eating meals?: A Little Help from another person taking care of personal grooming?: A Little Help from another person toileting, which includes using toliet, bedpan, or urinal?: A Lot Help from another person bathing (including washing, rinsing, drying)?: A Lot Help from another person to put on and taking off regular upper body clothing?: A Little Help from another person to put on and taking off regular lower body clothing?: A Lot 6 Click Score: 15    End of Session Equipment Utilized During Treatment: Gait belt;Rolling walker  OT Visit Diagnosis: Unsteadiness on feet (R26.81);Repeated falls (R29.6);Muscle weakness (generalized) (M62.81)   Activity Tolerance Patient tolerated treatment well   Patient Left in bed;with call bell/phone within reach   Nurse Communication Mobility status;Other (comment) (buttock wound)        Time: 0938-1829 OT Time Calculation (min): 31 min  Charges: OT General Charges $OT Visit: 1 Visit OT Treatments $Self Care/Home Management : 23-37 mins  Delbert Phenix OT OT pager: [place] 04/07/2020, 1:31 PM

## 2020-04-07 NOTE — Progress Notes (Signed)
Informed by L2 therapist patient has a red rash in her lower abdominal region. Patient has received 4 radiation treatments to a painful bony sacral lesion. This rash most likely is fungal. Spoke with Maudie Mercury, RN caring for patient on [place] confirms awareness of the rash. Maudie Mercury, RN explains the hospitalist has ordered a powder and interdry to apply to area in question. Informed radiation treatment team of these findings.

## 2020-04-07 NOTE — Progress Notes (Signed)
Dressing applied, applied aquacell and secured with mepilix on right inner buttock wound.

## 2020-04-07 NOTE — Progress Notes (Signed)
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  Presents with progressive weakness and left buttock/thigh pain. Patient had a subacute left hip/buttock pain, that was refractory to outpatient management.  Over the last 10 days prior to hospitalization she developed difficulty ambulating and progressive weakness in the left lower extremity.  On her initial physical examination blood pressure 136/56, heart rate 87, respirate 15, oxygen saturation 95%, her lungs were clear to auscultation bilaterally, heart S1-S2, present rhythmic, soft abdomen and no lower extremity edema.   Thoracic lumbar MRI positive for hyperintense lesions involving the central and left aspect of the sacrum, concerning for osseous metastatic disease.  Probably early invasion of the adjacent left S1-S2 neural foramina. Positive degenerative disc disease,   Further work-up with CT chest, abdomen pelvis showed destructive left sacral lesion with associated 2.8 x 3.5 cm soft tissue component anterior to the left sacrum worrisome for metastasis.  2.6 cm hypoenhancing mass in the right upper kidney.   CT-guided biopsies 11/29 of sacral lesion showing metastatic adenocarcinoma consistent with gastrointestinal primary.   She was transferred to [facility] for initiation of XRT. She will transfer to SNF after.  She has multiple bed offers but will not be accepted until radiation treatments are complete.   Interval History:   No events overnight. Pain improved some this am as well. Informed her we were waiting on radiation to complete at this point before we'd be able to get her to SNF. She understands.   Old records reviewed in assessment of this patient  ROS: Constitutional: negative for chills and fevers, Respiratory: negative for cough, Cardiovascular: negative for chest pain and Gastrointestinal: negative for abdominal pain  Assessment & Plan: * Adenocarcinoma of sigmoid colon ([place]) - Biopsy positive for adenocarcinoma of intestinal origin. - Hx of XI robot assisted sigmoidectomy on 07/2019 per Tonya Dougherty at [facility], pathology was positive for invasive adenocarcinoma well differentiated, surgical margins were negative for carcinoma and no evidence of carcinoma in 11 of 11 lymph nodes. - oncology (Tonya Dougherty) consulted and discussed the biopsy results with the patient and recommended that she be seen by radiation oncology later for consideration of palliative radiation to the left sacral mass. She was transferred to [facility] for radiation - Radiation oncology evaluating and recommending a 2-week course of daily palliative radiotherapy to the painful sacral metastasis - Tonya Dougherty is going to submit the sacrum biopsy results for foundation 1 testing - Aggressive bowel regimen with miralax and fleet enema. - PT/OT: recommending SNF; multiple offers but will not be able to discharge until radiation is completed  Bone metastasis ([place]) - see adenocarcinoma - patient undergoing radiation for pain management   Type 2 diabetes mellitus with hyperglycemia, with long-term current use of insulin (HCC) - lantus, SSI, DM diet, glucose checks; adjust as necessary  Iron deficiency anemia - no signs of bleed; continue iron, follow  Constipation -Continue bowel regimen  Sacral lesion -Continue wound care and turning often  Pressure injury of skin -Continue local  wound care  History of DVT (deep vein  thrombosis) -Continue Eliquis  Nicotine dependence, cigarettes, uncomplicated -Continue nicotine patch  Mixed diabetic hyperlipidemia associated with type 2 diabetes mellitus ([place]) -Continue statin  GERD without esophagitis -Continue PPI  Essential hypertension - continue coreg  Acute cystitis without hematuria - completed abx; denies new Sx   Antimicrobials:   DVT prophylaxis: Eliquis Code Status: Rull Family Communication: none present Disposition Plan: Status is: Inpatient  Remains inpatient appropriate because:IV treatments appropriate due to intensity of illness or inability to take PO and Inpatient level of care appropriate due to severity of illness   Dispo: The patient is from: Home              Anticipated d/c is to: SNF              Anticipated d/c date is: When bed available              Patient currently is medically stable to d/c.  Objective: Blood pressure 117/72, pulse 76, temperature 97.9 F (36.6 C), temperature source Oral, resp. rate 16, height 5\' 2"  (1.575 m), weight 90 kg, SpO2 91 %.  Examination: General appearance: Adult woman lying in bed uncomfortable but no obvious distress Head: Normocephalic, without obvious abnormality, atraumatic Eyes: EOMI Lungs: clear to auscultation bilaterally Heart: regular rate and rhythm and S1, S2 normal Abdomen: normal findings: bowel sounds normal and soft, non-tender Extremities:  No edema.  Less pain with plantar flexion of left lower extremity Skin: mobility and turgor normal Neurologic: Moves all 4 extremities and follows commands.  Pain in left lower extremity with range of motion  Consultants:   Oncology  Radiation oncology  Procedures:     Data Reviewed: I have personally reviewed following labs and imaging studies Results for orders placed or performed during the hospital encounter of 03/24/20 (from the past 24 hour(s))  Glucose, capillary     Status: Abnormal   Collection Time: 04/06/20   4:53 PM  Result Value Ref Range   Glucose-Capillary 111 (H) 70 - 99 mg/dL  Glucose, capillary     Status: Abnormal   Collection Time: 04/06/20  8:56 PM  Result Value Ref Range   Glucose-Capillary 116 (H) 70 - 99 mg/dL  Glucose, capillary     Status: None   Collection Time: 04/07/20  7:35 AM  Result Value Ref Range   Glucose-Capillary 98 70 - 99 mg/dL  Glucose, capillary     Status: Abnormal   Collection Time: 04/07/20 11:33 AM  Result Value Ref Range   Glucose-Capillary 153 (H) 70 - 99 mg/dL    No results found for this or any previous visit (from the past 240 hour(s)).   Radiology Studies: No results found. CT BIOPSY  Final Result    CT CHEST ABDOMEN PELVIS W CONTRAST  Final Result    MR LUMBAR SPINE WO CONTRAST  Final Result    MR THORACIC SPINE WO CONTRAST  Final Result    DG Hip Unilat W or Wo Pelvis 2-3 Views Left  Final Result    DG Knee Complete 4 Views Left  Final Result      Scheduled Meds: . apixaban  5 mg Oral BID  . carvedilol  3.125 mg Oral BID WC  . citalopram  30 mg Oral QHS  . feeding supplement  237 mL Oral BID BM  . ferrous sulfate  325 mg Oral Q breakfast  . gabapentin  600 mg Oral TID  . insulin aspart  0-15 Units Subcutaneous TID AC & HS  . insulin glargine  40 Units Subcutaneous Q2200  . multivitamin with minerals  1 tablet Oral Daily  . nystatin   Topical BID  . oxyCODONE  10 mg Oral Q12H  . pantoprazole  40 mg Oral Daily  . polyethylene glycol  17 g Oral BID  . Zinc Oxide   Topical BID   PRN Meds: acetaminophen **OR** acetaminophen, diphenhydrAMINE, fluticasone, HYDROmorphone (DILAUDID) injection, lip balm, ondansetron **OR** ondansetron (ZOFRAN) IV, oxyCODONE-acetaminophen, polyethylene glycol, polyvinyl alcohol, senna-docusate Continuous Infusions:   LOS: 12 days  Time spent: Greater than 50% of the 35 minute visit was spent in counseling/coordination of care for the patient as laid out in the A&P.   Dwyane Dee, MD [facility] 04/07/2020, 2:22 PM

## 2020-04-07 NOTE — TOC Progression Note (Signed)
Transition of Care [facility]) - Progression Note    Patient Details  Name: Tonya Dougherty MRN: [id number] Date of Birth: 07-30-1950  [facility] of [facility] CM/SW Contact  Ankush Gintz, Marjie Skiff, RN Phone Number: 04/07/2020, 2:10 PM  Clinical Narrative:     Pt did had three SNF bed offers after being faxed out to a wider geographic area. These SNFs then declined pt after talking with her insurance and found out the SNF facility would have to pay for radiation if they admitted her prior to radiation being completed. TOC will continue to follow.  Expected Discharge Plan: [place] Barriers to Discharge: Continued Medical Work up  Expected Discharge Plan and Services Expected Discharge Plan: [place]   Discharge Planning Services: CM Consult Post Acute Care Choice: [place] Living arrangements for the past 2 months: Single Family Home                     Social Determinants of Health (SDOH) Interventions    Readmission Risk Interventions Readmission Risk Prevention Plan 03/30/2020  Transportation Screening Complete  PCP or Specialist Appt within 5-7 Days Complete  Home Care Screening Complete  Medication Review (RN CM) Complete  Some recent data might be hidden

## 2020-04-07 NOTE — Progress Notes (Signed)
Chaplain engaged in a follow-up visit for Advanced Directive.  Gladie stated that her sister currently has the paperwork and will reach back out to [place] if she would like to complete it.  Chaplain offered support and will follow-up as needed.     04/07/20 1100  Clinical Encounter Type  Visited With Patient  Visit Type Follow-up

## 2020-04-08 ENCOUNTER — Ambulatory Visit
Admit: 2020-04-08 | Discharge: 2020-04-08 | Disposition: A | Discharge status: Home / Self Care | Payer: Medicare HMO | Attending: Radiation Oncology | Admitting: Radiation Oncology

## 2020-04-08 LAB — GLUCOSE, CAPILLARY
Glucose-Capillary: 113 mg/dL — ABNORMAL HIGH (ref 70–99)
Glucose-Capillary: 181 mg/dL — ABNORMAL HIGH (ref 70–99)
Glucose-Capillary: 103 mg/dL — ABNORMAL HIGH (ref 70–99)
Glucose-Capillary: 149 mg/dL — ABNORMAL HIGH (ref 70–99)
Glucose-Capillary: 147 mg/dL — ABNORMAL HIGH (ref 70–99)
Glucose-Capillary: 132 mg/dL — ABNORMAL HIGH (ref 70–99)

## 2020-04-08 NOTE — Progress Notes (Signed)
Pt alert and aware sister at bedside. They wanted to complete the AD form.  The chaplain got the witnesses and notary. I offered caring and supportive presence.

## 2020-04-08 NOTE — Progress Notes (Signed)
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  Presents with progressive weakness and left buttock/thigh pain. Patient had a subacute left hip/buttock pain, that was refractory to outpatient management.  Over the last 10 days prior to hospitalization she developed difficulty ambulating and progressive weakness in the left lower extremity.  On her initial physical examination blood pressure 136/56, heart rate 87, respirate 15, oxygen saturation 95%, her lungs were clear to auscultation bilaterally, heart S1-S2, present rhythmic, soft abdomen and no lower extremity edema.   Thoracic lumbar MRI positive for hyperintense lesions involving the central and left aspect of the sacrum, concerning for osseous metastatic disease.  Probably early invasion of the adjacent left S1-S2 neural foramina. Positive degenerative disc disease,   Further work-up with CT chest, abdomen pelvis showed destructive left sacral lesion with associated 2.8 x 3.5 cm soft tissue component anterior to the left sacrum worrisome for metastasis.  2.6 cm hypoenhancing mass in the right upper kidney.   CT-guided biopsies 11/29 of sacral lesion showing metastatic adenocarcinoma consistent with gastrointestinal primary.   She was transferred to [facility] for initiation of XRT. She will transfer to SNF after.  She has multiple bed offers but will not be accepted until radiation treatments are complete.   Interval History:   No events overnight. Resting in bed this morning. She definitely appears more comfortable each day, especially from a pain standpoint.   Old records reviewed in assessment of this patient  ROS: Constitutional: negative for chills and fevers, Respiratory: negative for cough, Cardiovascular: negative for chest pain and Gastrointestinal: negative for abdominal pain  Assessment & Plan: * Adenocarcinoma of sigmoid colon ([place]) - Biopsy positive for adenocarcinoma of intestinal origin. - Hx of XI robot assisted sigmoidectomy on 07/2019 per Tonya Dougherty at [facility], pathology was positive for invasive adenocarcinoma well differentiated, surgical margins were negative for carcinoma and no evidence of carcinoma in 11 of 11 lymph nodes. - oncology (Tonya Dougherty) consulted and discussed the biopsy results with the patient and recommended that she be seen by radiation oncology later for consideration of palliative radiation to the left sacral mass. She was transferred to [facility] for radiation - Radiation oncology evaluating and recommending a 2-week course of daily palliative radiotherapy to the painful sacral metastasis - Tonya Dougherty is going to submit the sacrum biopsy results for foundation 1 testing - Aggressive bowel regimen with miralax and fleet enema. - PT/OT: recommending SNF; multiple offers but will not be able to discharge until radiation is completed  Bone metastasis ([place]) - see adenocarcinoma - patient undergoing radiation for pain management   Type 2 diabetes mellitus with hyperglycemia, with long-term current use of insulin (HCC) - lantus, SSI, DM diet, glucose checks; adjust as necessary  Iron deficiency anemia - no signs of bleed; continue iron, follow  Constipation -Continue bowel regimen  Sacral lesion -Continue wound care and turning often  Pressure injury of skin -Continue local wound care  History of DVT (deep vein thrombosis) -Continue Eliquis  Nicotine  dependence,  cigarettes, uncomplicated -Continue nicotine patch  Mixed diabetic hyperlipidemia associated with type 2 diabetes mellitus ([place]) -Continue statin  GERD without esophagitis -Continue PPI  Essential hypertension - continue coreg  Acute cystitis without hematuria - completed abx; denies new Sx   Antimicrobials:   DVT prophylaxis: Eliquis Code Status: Rull Family Communication: none present Disposition Plan: Status is: Inpatient  Remains inpatient appropriate because:IV treatments appropriate due to intensity of illness or inability to take PO and Inpatient level of care appropriate due to severity of illness   Dispo: The patient is from: Home              Anticipated d/c is to: SNF              Anticipated d/c date is: When bed available              Patient currently is medically stable to d/c.  Objective: Blood pressure 114/61, pulse 79, temperature 98.2 F (36.8 C), temperature source Oral, resp. rate 16, height 5\' 2"  (1.575 m), weight 90 kg, SpO2 (!) 84 %.  Examination: General appearance: Adult woman lying in bed uncomfortable but no obvious distress Head: Normocephalic, without obvious abnormality, atraumatic Eyes: EOMI Lungs: clear to auscultation bilaterally Heart: regular rate and rhythm and S1, S2 normal Abdomen: normal findings: bowel sounds normal and soft, non-tender Extremities:  No edema.  Less pain with plantar flexion of left lower extremity Skin: mobility and turgor normal Neurologic: Moves all 4 extremities and follows commands. Much improved pain in LLE  Consultants:   Oncology  Radiation oncology  Procedures:     Data Reviewed: I have personally reviewed following labs and imaging studies Results for orders placed or performed during the hospital encounter of 03/24/20 (from the past 24 hour(s))  Glucose, capillary     Status: Abnormal   Collection Time: 04/07/20  5:23 PM  Result Value Ref Range   Glucose-Capillary 133 (H) 70 - 99 mg/dL   Glucose, capillary     Status: Abnormal   Collection Time: 04/07/20  9:24 PM  Result Value Ref Range   Glucose-Capillary 100 (H) 70 - 99 mg/dL  Glucose, capillary     Status: Abnormal   Collection Time: 04/08/20  8:18 AM  Result Value Ref Range   Glucose-Capillary 103 (H) 70 - 99 mg/dL  Glucose, capillary     Status: Abnormal   Collection Time: 04/08/20 12:24 PM  Result Value Ref Range   Glucose-Capillary 149 (H) 70 - 99 mg/dL    No results found for this or any previous visit (from the past 240 hour(s)).   Radiology Studies: No results found. CT BIOPSY  Final Result    CT CHEST ABDOMEN PELVIS W CONTRAST  Final Result    MR LUMBAR SPINE WO CONTRAST  Final Result    MR THORACIC SPINE WO CONTRAST  Final Result    DG Hip Unilat W or Wo Pelvis 2-3 Views Left  Final Result    DG Knee Complete 4 Views Left  Final Result      Scheduled Meds: . apixaban  5 mg Oral BID  . carvedilol  3.125 mg Oral BID WC  . citalopram  30 mg Oral QHS  . feeding supplement  237 mL Oral BID BM  . ferrous sulfate  325 mg Oral Q breakfast  . gabapentin  600 mg Oral TID  . insulin aspart  0-15 Units Subcutaneous TID AC & HS  . insulin glargine  40 Units Subcutaneous  Q2200  . multivitamin with minerals  1 tablet Oral Daily  . nystatin   Topical BID  . oxyCODONE  10 mg Oral Q12H  . pantoprazole  40 mg Oral Daily  . polyethylene glycol  17 g Oral BID  . Zinc Oxide   Topical BID   PRN Meds: acetaminophen **OR** acetaminophen, diphenhydrAMINE, fluticasone, HYDROmorphone (DILAUDID) injection, lip balm, ondansetron **OR** ondansetron (ZOFRAN) IV, oxyCODONE-acetaminophen, polyethylene glycol, polyvinyl alcohol, senna-docusate Continuous Infusions:   LOS: 13 days  Time spent: Greater than 50% of the 35 minute visit was spent in counseling/coordination of care for the patient as laid out in the A&P.   Dwyane Dee, MD [facility] 04/08/2020, 12:29 PM

## 2020-04-08 NOTE — Progress Notes (Signed)
OT Cancellation Note  Patient Details Name: Tonya Dougherty MRN: [id number] DOB: February 25, 1951   Cancelled Treatment:    Reason Eval/Treat Not Completed: Patient's level of consciousness. Patient unable to maintain alertness and keep eyes open. Will f/u as able.  Analina Filla L Beverlie Kurihara 04/08/2020, 3:05 PM

## 2020-04-08 NOTE — TOC Progression Note (Signed)
Transition of Care [facility]) - Progression Note    Patient Details  Name: Tonya Dougherty MRN: [id number] Date of Birth: 03-29-1951  Transition [facility]) CM/SW Contact  Meeka Cartelli, Marjie Skiff, RN Phone Number: 04/08/2020, 10:03 AM  Clinical Narrative:     Spoke with pt again for dc planning. Pt chooses [facility] SNF for when she is done with radiation on 12/16. Pt insurance has been taking up to a week for SNF authorization. [place] contacted to accept SNF bed. [place] to [employer] authorization today. TOC will continue to follow.    Expected Discharge Plan: [place] Barriers to Discharge: Continued Medical Work up  Expected Discharge Plan and Services Expected Discharge Plan: [place]   Discharge Planning Services: CM Consult Post Acute Care Choice: [place] Living arrangements for the past 2 months: Single Family Home                      Social Determinants of Health (SDOH) Interventions    Readmission Risk Interventions Readmission Risk Prevention Plan 03/30/2020  Transportation Screening Complete  PCP or Specialist Appt within 5-7 Days Complete  Home Care Screening Complete  Medication Review (RN CM) Complete  Some recent data might be hidden

## 2020-04-08 NOTE — Care Management Important Message (Signed)
Important Message  Patient Details IM Letter given to the Patient. Name: Tonya Dougherty MRN: [id number] Date of Birth: March 19, 1951   Medicare Important Message Given:  Yes     Kerin Salen 04/08/2020, 10:37 AM

## 2020-04-09 LAB — GLUCOSE, CAPILLARY
Glucose-Capillary: 93 mg/dL (ref 70–99)
Glucose-Capillary: 100 mg/dL — ABNORMAL HIGH (ref 70–99)
Glucose-Capillary: 128 mg/dL — ABNORMAL HIGH (ref 70–99)
Glucose-Capillary: 168 mg/dL — ABNORMAL HIGH (ref 70–99)

## 2020-04-09 NOTE — Progress Notes (Signed)
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  Presents with progressive weakness and left buttock/thigh pain. Patient had a subacute left hip/buttock pain, that was refractory to outpatient management.  Over the last 10 days prior to hospitalization she developed difficulty ambulating and progressive weakness in the left lower extremity.  On her initial physical examination blood pressure 136/56, heart rate 87, respirate 15, oxygen saturation 95%, her lungs were clear to auscultation bilaterally, heart S1-S2, present rhythmic, soft abdomen and no lower extremity edema.   Thoracic lumbar MRI positive for hyperintense lesions involving the central and left aspect of the sacrum, concerning for osseous metastatic disease.  Probably early invasion of the adjacent left S1-S2 neural foramina. Positive degenerative disc disease,   Further work-up with CT chest, abdomen pelvis showed destructive left sacral lesion with associated 2.8 x 3.5 cm soft tissue component anterior to the left sacrum worrisome for metastasis.  2.6 cm hypoenhancing mass in the right upper kidney.   CT-guided biopsies 11/29 of sacral lesion showing metastatic adenocarcinoma consistent with gastrointestinal primary.   She was transferred to [facility] for initiation of XRT. She will transfer to SNF after.  She has multiple bed offers but will not be accepted until radiation treatments are complete.   Interval History:   No events overnight. Sitting up in chair this am. Memory was a little bad today; she couldn't remember why she's in the hospital and how much longer. She seems to mostly remember when I reminded her about radiation needing to complete before discharge.  Old records reviewed in assessment of this patient  ROS: Constitutional: negative for chills and fevers, Respiratory: negative for cough, Cardiovascular: negative for chest pain and Gastrointestinal: negative for abdominal pain  Assessment & Plan: * Adenocarcinoma of sigmoid colon ([place]) - Biopsy positive for adenocarcinoma of intestinal origin. - Hx of XI robot assisted sigmoidectomy on 07/2019 per Dr Dema Severin at [facility], pathology was positive for invasive adenocarcinoma well differentiated, surgical margins were negative for carcinoma and no evidence of carcinoma in 11 of 11 lymph nodes. - oncology (Dr. Benay Spice) consulted and discussed the biopsy results with the patient and recommended that she be seen by radiation oncology later for consideration of palliative radiation to the left sacral mass. She was transferred to [facility] for radiation - Radiation oncology evaluating and recommending a 2-week course of daily palliative radiotherapy to the painful sacral metastasis - Dr. Benay Spice is going to submit the sacrum biopsy results for foundation 1 testing - Aggressive bowel regimen with miralax and fleet enema. - PT/OT: recommending SNF; multiple offers but will not be able to discharge until radiation is completed  Bone metastasis ([place]) - see adenocarcinoma - patient undergoing radiation for pain management   Type 2 diabetes mellitus with hyperglycemia, with long-term current use of insulin (HCC) - lantus, SSI, DM diet, glucose checks; adjust as necessary  Iron deficiency anemia - no signs of bleed; continue iron, follow  Constipation -Continue bowel regimen  Sacral lesion -Continue wound care and  turning often  Pressure injury of  skin -Continue local wound care  History of DVT (deep vein thrombosis) -Continue Eliquis  Nicotine dependence, cigarettes, uncomplicated -Continue nicotine patch  Mixed diabetic hyperlipidemia associated with type 2 diabetes mellitus ([place]) -Continue statin  GERD without esophagitis -Continue PPI  Essential hypertension - continue coreg  Acute cystitis without hematuria - completed abx; denies new Sx   Antimicrobials:   DVT prophylaxis: Eliquis Code Status: Rull Family Communication: none present Disposition Plan: Status is: Inpatient  Remains inpatient appropriate because:IV treatments appropriate due to intensity of illness or inability to take PO and Inpatient level of care appropriate due to severity of illness   Dispo: The patient is from: Home              Anticipated d/c is to: SNF              Anticipated d/c date is: When bed available/radiation completed              Patient currently is medically stable to d/c.  Objective: Blood pressure 120/63, pulse 80, temperature 98.5 F (36.9 C), temperature source Oral, resp. rate 16, height 5\' 2"  (1.575 m), weight 90 kg, SpO2 (!) 88 %.  Examination: General appearance: Adult woman sitting up in chair comfortable and in NAD Head: Normocephalic, without obvious abnormality, atraumatic Eyes: EOMI Lungs: clear to auscultation bilaterally Heart: regular rate and rhythm and S1, S2 normal Abdomen: normal findings: bowel sounds normal and soft, non-tender Extremities:  No edema.  No pain with plantar flexion of left lower extremity Skin: mobility and turgor normal Neurologic: Moves all 4 extremities and follows commands. Much improved pain in LLE  Consultants:   Oncology  Radiation oncology  Procedures:     Data Reviewed: I have personally reviewed following labs and imaging studies Results for orders placed or performed during the hospital encounter of 03/24/20 (from the past 24 hour(s))  Glucose, capillary      Status: Abnormal   Collection Time: 04/08/20  5:59 PM  Result Value Ref Range   Glucose-Capillary 147 (H) 70 - 99 mg/dL  Glucose, capillary     Status: Abnormal   Collection Time: 04/08/20  9:49 PM  Result Value Ref Range   Glucose-Capillary 132 (H) 70 - 99 mg/dL  Glucose, capillary     Status: None   Collection Time: 04/09/20  7:37 AM  Result Value Ref Range   Glucose-Capillary 93 70 - 99 mg/dL  Glucose, capillary     Status: Abnormal   Collection Time: 04/09/20 11:43 AM  Result Value Ref Range   Glucose-Capillary 100 (H) 70 - 99 mg/dL    No results found for this or any previous visit (from the past 240 hour(s)).   Radiology Studies: No results found. CT BIOPSY  Final Result    CT CHEST ABDOMEN PELVIS W CONTRAST  Final Result    MR LUMBAR SPINE WO CONTRAST  Final Result    MR THORACIC SPINE WO CONTRAST  Final Result    DG Hip Unilat W or Wo Pelvis 2-3 Views Left  Final Result    DG Knee Complete 4 Views Left  Final Result      Scheduled Meds: . apixaban  5 mg Oral BID  . carvedilol  3.125 mg Oral BID WC  . citalopram  30 mg Oral QHS  . feeding supplement  237 mL Oral BID BM  . ferrous sulfate  325 mg Oral Q breakfast  . gabapentin  600 mg  Oral TID  . insulin aspart  0-15 Units Subcutaneous TID AC & HS  . insulin glargine  40 Units Subcutaneous Q2200  . multivitamin with minerals  1 tablet Oral Daily  . nystatin   Topical BID  . oxyCODONE  10 mg Oral Q12H  . pantoprazole  40 mg Oral Daily  . polyethylene glycol  17 g Oral BID  . Zinc Oxide   Topical BID   PRN Meds: acetaminophen **OR** acetaminophen, diphenhydrAMINE, fluticasone, HYDROmorphone (DILAUDID) injection, lip balm, ondansetron **OR** ondansetron (ZOFRAN) IV, oxyCODONE-acetaminophen, polyethylene glycol, polyvinyl alcohol, senna-docusate Continuous Infusions:   LOS: 14 days  Time spent: Greater than 50% of the 35 minute visit was spent in counseling/coordination of care for the patient as laid  out in the A&P.   Dwyane Dee, MD [facility] 04/09/2020, 2:13 PM

## 2020-04-09 NOTE — Progress Notes (Signed)
Physical Therapy Treatment Patient Details Name: Tonya Dougherty MRN: [id number] DOB: [birth year] Today's Date: 04/09/2020    History of Present Illness Pt is a 69 y.o. female with a medical history significant for adenocarcinoma of sigmoid colon s/p resection 09/5679, nonalcoholic fatty liver disease, DM2, HTN, DVT (Eliquis), admitted 03/24/20 with progressive LLE pain radiating from L buttock to foot over the last 3 months that has gotten worse acutely. Imaging showed destructive L sacral lesion found to be metastatic adenocarcinoma.  Pt started on radiation treatments.    PT Comments    Pt with intermittent ability to follow directions.  Transferred with less assistance, but continues to have decresed balance. Con't to recommend SNF.   Follow Up Recommendations  SNF;Supervision/Assistance - 24 hour     Equipment Recommendations  3in1 (PT);Wheelchair (measurements PT);Wheelchair cushion (measurements PT)    Recommendations for Other Services       Precautions / Restrictions Precautions Precautions: Fall Precaution Comments: has fallen twice with nursing this week 12/9 Restrictions Weight Bearing Restrictions: No    Mobility  Bed Mobility     Rolling: Min guard   Supine to sit: Min guard;+2 for safety/equipment     General bed mobility comments: cues to square up on EOB.  Transfers Overall transfer level: Needs assistance Equipment used: Rolling walker (2 wheeled) Transfers: Stand Pivot Transfers;Sit to/from Stand   Stand pivot transfers: Min assist;+2 safety/equipment       General transfer comment: Pt c/o ankle soreness. Cues for safe transfer technique.  Ambulation/Gait             General Gait Details: deferred due to decreased ability to follow safety directions.   Stairs             Wheelchair Mobility    Modified Rankin (Stroke Patients Only)       Balance Overall balance assessment: Needs assistance Sitting-balance support: Feet  supported;Bilateral upper extremity supported Sitting balance-Leahy Scale: Fair Sitting balance - Comments: decreased balance while donning gown.   Standing balance support: Bilateral upper extremity supported Standing balance-Leahy Scale: Poor Standing balance comment: reliant on external support                            Cognition Arousal/Alertness: Awake/alert Behavior During Therapy: Restless;Impulsive Overall Cognitive Status: No family/caregiver present to determine baseline cognitive functioning Area of Impairment: Safety/judgement;Following commands;Memory                       Following Commands: Follows one step commands with increased time Safety/Judgement: Decreased awareness of safety     General Comments: Pt seems to wax and wane in her ability to follow directions.      Exercises      General Comments        Pertinent Vitals/Pain Pain Assessment: No/denies pain    Home Living                      Prior Function            PT Goals (current goals can now be found in the care plan section) Acute Rehab PT Goals PT Goal Formulation: With patient Time For Goal Achievement: 04/23/20 Potential to Achieve Goals: Fair Progress towards PT goals: Progressing toward goals    Frequency    Min 2X/week      PT Plan Current plan remains appropriate    Co-evaluation  AM-PAC PT "6 Clicks" Mobility   Outcome Measure  Help needed turning from your back to your side while in a flat bed without using bedrails?: A Little Help needed moving from lying on your back to sitting on the side of a flat bed without using bedrails?: A Little Help needed moving to and from a bed to a chair (including a wheelchair)?: A Little Help needed standing up from a chair using your arms (e.g., wheelchair or bedside chair)?: A Little Help needed to walk in hospital room?: A Lot Help needed climbing 3-5 steps with a railing? :  Total 6 Click Score: 15    End of Session Equipment Utilized During Treatment: Gait belt Activity Tolerance: Patient tolerated treatment well Patient left: with chair alarm set;in chair;with call bell/phone within reach Nurse Communication: Mobility status;Need for lift equipment PT Visit Diagnosis: Unsteadiness on feet (R26.81);Muscle weakness (generalized) (M62.81);Other abnormalities of gait and mobility (R26.89)     Time: 2224-1146 PT Time Calculation (min) (ACUTE ONLY): 20 min  Charges:  $Therapeutic Activity: 8-22 mins                     Alisabeth Selkirk L. Tamala Julian, [place] Pager [phone] 04/09/2020    Galen Manila 04/09/2020, 1:08 PM

## 2020-04-10 LAB — GLUCOSE, CAPILLARY
Glucose-Capillary: 78 mg/dL (ref 70–99)
Glucose-Capillary: 150 mg/dL — ABNORMAL HIGH (ref 70–99)
Glucose-Capillary: 135 mg/dL — ABNORMAL HIGH (ref 70–99)
Glucose-Capillary: 80 mg/dL (ref 70–99)

## 2020-04-10 MED ORDER — INSULIN GLARGINE 100 UNIT/ML ~~LOC~~ SOLN: 20.0000 [IU] | Freq: Once | SUBCUTANEOUS | Status: AC

## 2020-04-10 NOTE — Progress Notes (Signed)
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  Presents with progressive weakness and left buttock/thigh pain. Patient had a subacute left hip/buttock pain, that was refractory to outpatient management.  Over the last 10 days prior to hospitalization she developed difficulty ambulating and progressive weakness in the left lower extremity.  On her initial physical examination blood pressure 136/56, heart rate 87, respirate 15, oxygen saturation 95%, her lungs were clear to auscultation bilaterally, heart S1-S2, present rhythmic, soft abdomen and no lower extremity edema.   Thoracic lumbar MRI positive for hyperintense lesions involving the central and left aspect of the sacrum, concerning for osseous metastatic disease.  Probably early invasion of the adjacent left S1-S2 neural foramina. Positive degenerative disc disease,   Further work-up with CT chest, abdomen pelvis showed destructive left sacral lesion with associated 2.8 x 3.5 cm soft tissue component anterior to the left sacrum worrisome for metastasis.  2.6 cm hypoenhancing mass in the right upper kidney.   CT-guided biopsies 11/29 of sacral lesion showing metastatic adenocarcinoma consistent with gastrointestinal primary.   She was transferred to [facility] for initiation of XRT. She will transfer to SNF after.  She has multiple bed offers but will not be accepted until radiation treatments are complete.   Interval History:   No events overnight.  Just relaxing in bed this morning when seen.  No issues to report and she understands just waiting on radiation to complete prior to being able to leave hospital.  Old records reviewed in assessment of this patient  ROS: Constitutional: negative for chills and fevers, Respiratory: negative for cough, Cardiovascular: negative for chest pain and Gastrointestinal: negative for abdominal pain  Assessment & Plan: * Adenocarcinoma of sigmoid colon ([place]) - Biopsy positive for adenocarcinoma of intestinal origin. - Hx of XI robot assisted sigmoidectomy on 07/2019 per Dr Dema Severin at [facility], pathology was positive for invasive adenocarcinoma well differentiated, surgical margins were negative for carcinoma and no evidence of carcinoma in 11 of 11 lymph nodes. - oncology (Dr. Benay Spice) consulted and discussed the biopsy results with the patient and recommended that she be seen by radiation oncology later for consideration of palliative radiation to the left sacral mass. She was transferred to [facility] for radiation - Radiation oncology evaluating and recommending a 2-week course of daily palliative radiotherapy to the painful sacral metastasis - Dr. Benay Spice is going to submit the sacrum biopsy results for foundation 1 testing - Aggressive bowel regimen with miralax and fleet enema. - PT/OT: recommending SNF; multiple offers but will not be able to discharge until radiation is completed  Bone metastasis ([place]) - see adenocarcinoma - patient undergoing radiation for pain management   Type 2 diabetes mellitus with hyperglycemia, with long-term current use of insulin (HCC) - lantus, SSI, DM diet, glucose checks; adjust as necessary  Iron deficiency anemia - no signs of bleed; continue iron, follow  Constipation -Continue bowel regimen  Sacral lesion -Continue wound care and turning often  Pressure injury of skin -Continue local wound  care  History of DVT (deep vein  thrombosis) -Continue Eliquis  Nicotine dependence, cigarettes, uncomplicated -Continue nicotine patch  Mixed diabetic hyperlipidemia associated with type 2 diabetes mellitus ([place]) -Continue statin  GERD without esophagitis -Continue PPI  Essential hypertension - continue coreg  Acute cystitis without hematuria - completed abx; denies new Sx   Antimicrobials:   DVT prophylaxis: Eliquis Code Status: Rull Family Communication: none present Disposition Plan: Status is: Inpatient  Remains inpatient appropriate because:Inpatient level of care appropriate due to severity of illness and Ongoing radiation   Dispo: The patient is from: Home              Anticipated d/c is to: SNF              Anticipated d/c date is: When bed available/radiation completed              Patient currently is medically stable to d/c.  Objective: Blood pressure (P) 117/60, pulse (P) 70, temperature (P) 97.9 F (36.6 C), temperature source (P) Oral, resp. rate (P) 16, height 5\' 2"  (1.575 m), weight 90 kg, SpO2 (P) 94 %.  Examination: General appearance: Adult woman laying in bed appearing comfortable and in no distress Head: Normocephalic, without obvious abnormality, atraumatic Eyes: EOMI Lungs: clear to auscultation bilaterally Heart: regular rate and rhythm and S1, S2 normal Abdomen: normal findings: bowel sounds normal and soft, non-tender Extremities:  No edema.  No pain with plantar flexion of left lower extremity Skin: mobility and turgor normal Neurologic: Moves all 4 extremities and follows commands. Much improved pain in LLE  Consultants:   Oncology  Radiation oncology  Procedures:     Data Reviewed: I have personally reviewed following labs and imaging studies Results for orders placed or performed during the hospital encounter of 03/24/20 (from the past 24 hour(s))  Glucose, capillary     Status: Abnormal   Collection Time: 04/09/20  4:58 PM  Result Value Ref Range    Glucose-Capillary 128 (H) 70 - 99 mg/dL  Glucose, capillary     Status: Abnormal   Collection Time: 04/09/20 10:39 PM  Result Value Ref Range   Glucose-Capillary 168 (H) 70 - 99 mg/dL  Glucose, capillary     Status: None   Collection Time: 04/10/20  8:05 AM  Result Value Ref Range   Glucose-Capillary 78 70 - 99 mg/dL  Glucose, capillary     Status: Abnormal   Collection Time: 04/10/20 12:43 PM  Result Value Ref Range   Glucose-Capillary 150 (H) 70 - 99 mg/dL    No results found for this or any previous visit (from the past 240 hour(s)).   Radiology Studies: No results found. CT BIOPSY  Final Result    CT CHEST ABDOMEN PELVIS W CONTRAST  Final Result    MR LUMBAR SPINE WO CONTRAST  Final Result    MR THORACIC SPINE WO CONTRAST  Final Result    DG Hip Unilat W or Wo Pelvis 2-3 Views Left  Final Result    DG Knee Complete 4 Views Left  Final Result      Scheduled Meds: . apixaban  5 mg Oral BID  . carvedilol  3.125 mg Oral BID WC  . citalopram  30 mg Oral QHS  . feeding supplement  237 mL Oral BID BM  . ferrous sulfate  325 mg Oral Q breakfast  . gabapentin  600 mg Oral TID  . insulin aspart  0-15 Units Subcutaneous TID AC & HS  .  insulin glargine  40 Units Subcutaneous Q2200  . multivitamin with minerals  1 tablet Oral Daily  . nystatin   Topical BID  . oxyCODONE  10 mg Oral Q12H  . pantoprazole  40 mg Oral Daily  . polyethylene glycol  17 g Oral BID  . Zinc Oxide   Topical BID   PRN Meds: acetaminophen **OR** acetaminophen, diphenhydrAMINE, fluticasone, HYDROmorphone (DILAUDID) injection, lip balm, ondansetron **OR** ondansetron (ZOFRAN) IV, oxyCODONE-acetaminophen, polyethylene glycol, polyvinyl alcohol, senna-docusate Continuous Infusions:   LOS: 15 days  Time spent: Greater than 50% of the 35 minute visit was spent in counseling/coordination of care for the patient as laid out in the A&P.   Dwyane Dee, MD [facility] 04/10/2020, 1:25 PM

## 2020-04-11 ENCOUNTER — Ambulatory Visit
Admit: 2020-04-11 | Discharge: 2020-04-11 | Disposition: A | Discharge status: Home / Self Care | Payer: Medicare HMO | Attending: Radiation Oncology | Admitting: Radiation Oncology

## 2020-04-11 LAB — GLUCOSE, CAPILLARY
Glucose-Capillary: 89 mg/dL (ref 70–99)
Glucose-Capillary: 173 mg/dL — ABNORMAL HIGH (ref 70–99)
Glucose-Capillary: 116 mg/dL — ABNORMAL HIGH (ref 70–99)

## 2020-04-11 NOTE — Progress Notes (Signed)
Nutrition Follow-up  DOCUMENTATION CODES:   Morbid obesity  INTERVENTION:   -Pt needs updated weight for admission (last recorded 12/3)  -Ensure Enlive po BID, each supplement provides 350 kcal and 20 grams of protein -Multivitamin with minerals daily  NUTRITION DIAGNOSIS:   Increased nutrient needs related to cancer and cancer related treatments as evidenced by estimated needs.  Ongoing.  GOAL:   Patient will meet greater than or equal to 90% of their needs  Progressing.  MONITOR:   PO intake,Supplement acceptance,Labs,Weight trends,Skin,I & O's  ASSESSMENT:   69 year old female with past medical history of hypertension, nonalcoholic fatty liver disease, hyperlipidemia, diabetes mellitus type 2, gastroesophageal reflux disease, benign essential tremor and adenocarcinoma of the sigmoid colon (resected 07/2019) who presents to [facility] emergency department with complaints of progressive weakness as well as left buttock and thigh pain.  11/26- CT of abdomen/pelvis/ chest revealeddestructive left sacral lesionwith associated 2.8 x 3.5 cm soft tissue component anterior to the leftsacrum worrisome for metastasis, and2.6 cm hypoenhancing mass in the right upper kidney; MRI revealedsevere canal stenosis at L4-5 11/29- s/p sacral mass biopsy 12/3- transferred to [facility] for initiation of radiation  Patient is currently consuming 20-40% of meals at this time. Pt drinking Ensure supplements.   Per MD note, pt awaiting bed placement for discharge after completing radiation treatments (expected 12/16).  Admission weight: 246 lbs. Last recorded weight: 198 lbs  I/Os: +1.6L since 11/29 UOP: 250 ml  Medications: Ferrous sulfate, Multivitamin with minerals daily  Labs reviewed: CBGs: 80-135   Diet Order:   Diet Order            Diet Carb Modified Fluid consistency: Thin; Room service appropriate? Yes  Diet effective now                 EDUCATION NEEDS:   No  education needs have been identified at this time  Skin:  Skin Assessment: Skin Integrity Issues: Skin Integrity Issues:: Incisions Stage II: sacrum Incisions: lt sacrum  Last BM:  12/13- type 6  Height:   Ht Readings from Last 1 Encounters:  04/01/20 5\' 2"  (1.575 m)    Weight:   Wt Readings from Last 1 Encounters:  04/01/20 90 kg   BMI:  Body mass index is 36.29 kg/m.  Estimated Nutritional Needs:   Kcal:  2000-2200  Protein:  110-125 grams  Fluid:  > 2 L  Clayton Bibles, MS, RD, LDN Inpatient Clinical Dietitian Contact information available via Amion

## 2020-04-11 NOTE — Progress Notes (Signed)
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  Presents with progressive weakness and left buttock/thigh pain. Patient had a subacute left hip/buttock pain, that was refractory to outpatient management.  Over the last 10 days prior to hospitalization she developed difficulty ambulating and progressive weakness in the left lower extremity.  On her initial physical examination blood pressure 136/56, heart rate 87, respirate 15, oxygen saturation 95%, her lungs were clear to auscultation bilaterally, heart S1-S2, present rhythmic, soft abdomen and no lower extremity edema.   Thoracic lumbar MRI positive for hyperintense lesions involving the central and left aspect of the sacrum, concerning for osseous metastatic disease.  Probably early invasion of the adjacent left S1-S2 neural foramina. Positive degenerative disc disease,   Further work-up with CT chest, abdomen pelvis showed destructive left sacral lesion with associated 2.8 x 3.5 cm soft tissue component anterior to the left sacrum worrisome for metastasis.  2.6 cm hypoenhancing mass in the right upper kidney.   CT-guided biopsies 11/29 of sacral lesion showing metastatic adenocarcinoma consistent with gastrointestinal primary.   She was transferred to [facility] for initiation of XRT. She will transfer to SNF after.  She has multiple bed offers but will not be accepted until radiation treatments are complete.   Interval History:   No events overnight. Laying on her side this am getting backside cleaned and redressed by nursing. Examined her while staff in room and palpated spine as well.  She continues to feel improved and pains are improved overall.   Old records reviewed in assessment of this patient  ROS: Constitutional: negative for chills and fevers, Respiratory: negative for cough, Cardiovascular: negative for chest pain and Gastrointestinal: negative for abdominal pain  Assessment & Plan: * Adenocarcinoma of sigmoid colon ([place]) - Biopsy positive for adenocarcinoma of intestinal origin. - Hx of XI robot assisted sigmoidectomy on 07/2019 per Tonya Dougherty at [facility], pathology was positive for invasive adenocarcinoma well differentiated, surgical margins were negative for carcinoma and no evidence of carcinoma in 11 of 11 lymph nodes. - oncology (Tonya Dougherty) consulted and discussed the biopsy results with the patient and recommended that she be seen by radiation oncology later for consideration of palliative radiation to the left sacral mass. She was transferred to [facility] for radiation - Radiation oncology evaluating and recommending a 2-week course of daily palliative radiotherapy to the painful sacral metastasis - Tonya Dougherty is going to submit the sacrum biopsy results for foundation 1 testing - Aggressive bowel regimen with miralax and fleet enema. - PT/OT: recommending SNF; multiple offers but will not be able to discharge until radiation is completed  Bone metastasis ([place]) - see adenocarcinoma - patient undergoing radiation for pain management   Type 2 diabetes mellitus with hyperglycemia, with long-term current use of insulin (HCC) - lantus, SSI, DM diet, glucose checks; adjust as necessary  Iron deficiency anemia - no signs of bleed; continue iron, follow  Constipation -Continue bowel regimen  Sacral lesion -Continue wound care and turning often  Pressure  injury of skin -Continue local wound  care  History of DVT (deep vein thrombosis) -Continue Eliquis  Nicotine dependence, cigarettes, uncomplicated -Continue nicotine patch  Mixed diabetic hyperlipidemia associated with type 2 diabetes mellitus ([place]) -Continue statin  GERD without esophagitis -Continue PPI  Essential hypertension - continue coreg  Acute cystitis without hematuria - completed abx; denies new Sx   Antimicrobials:   DVT prophylaxis: Eliquis Code Status: [place] Family Communication: none present Disposition Plan: Status is: Inpatient  Remains inpatient appropriate because:Inpatient level of care appropriate due to severity of illness and Ongoing radiation   Dispo: The patient is from: Home              Anticipated d/c is to: SNF              Anticipated d/c date is: When bed available/radiation completed              Patient currently is medically stable to d/c.  Objective: Blood pressure (!) 109/51, pulse 65, temperature 98 F (36.7 C), temperature source Oral, resp. rate 16, height 5\' 2"  (1.575 m), weight 90 kg, SpO2 (!) 89 %.  Examination: General appearance: Adult woman laying in bed appearing comfortable and in no distress Head: Normocephalic, without obvious abnormality, atraumatic Eyes: EOMI Lungs: clear to auscultation bilaterally Heart: regular rate and rhythm and S1, S2 normal Abdomen: normal findings: bowel sounds normal and soft, non-tender Back: no spinal tenderness Extremities:  No edema.  No pain with plantar flexion of left lower extremity Skin: mobility and turgor normal Neurologic: Moves all 4 extremities and follows commands. Much improved pain in LLE  Consultants:   Oncology  Radiation oncology  Procedures:     Data Reviewed: I have personally reviewed following labs and imaging studies Results for orders placed or performed during the hospital encounter of 03/24/20 (from the past 24 hour(s))  Glucose, capillary     Status: Abnormal   Collection Time:  04/10/20  3:53 PM  Result Value Ref Range   Glucose-Capillary 135 (H) 70 - 99 mg/dL  Glucose, capillary     Status: None   Collection Time: 04/10/20  8:54 PM  Result Value Ref Range   Glucose-Capillary 80 70 - 99 mg/dL  Glucose, capillary     Status: None   Collection Time: 04/11/20  8:12 AM  Result Value Ref Range   Glucose-Capillary 89 70 - 99 mg/dL  Glucose, capillary     Status: Abnormal   Collection Time: 04/11/20 12:48 PM  Result Value Ref Range   Glucose-Capillary 173 (H) 70 - 99 mg/dL    No results found for this or any previous visit (from the past 240 hour(s)).   Radiology Studies: No results found. CT BIOPSY  Final Result    CT CHEST ABDOMEN PELVIS W CONTRAST  Final Result    MR LUMBAR SPINE WO CONTRAST  Final Result    MR THORACIC SPINE WO CONTRAST  Final Result    DG Hip Unilat W or Wo Pelvis 2-3 Views Left  Final Result    DG Knee Complete 4 Views Left  Final Result      Scheduled Meds: . apixaban  5 mg Oral BID  . carvedilol  3.125 mg Oral BID WC  . citalopram  30 mg Oral QHS  . feeding supplement  237 mL Oral BID BM  . ferrous sulfate  325 mg Oral Q breakfast  . gabapentin  600 mg Oral TID  . insulin aspart  0-15 Units Subcutaneous  TID AC & HS  . insulin glargine  40 Units Subcutaneous Q2200  . multivitamin with minerals  1 tablet Oral Daily  . nystatin   Topical BID  . oxyCODONE  10 mg Oral Q12H  . pantoprazole  40 mg Oral Daily  . polyethylene glycol  17 g Oral BID  . Zinc Oxide   Topical BID   PRN Meds: acetaminophen **OR** acetaminophen, diphenhydrAMINE, fluticasone, HYDROmorphone (DILAUDID) injection, lip balm, ondansetron **OR** ondansetron (ZOFRAN) IV, oxyCODONE-acetaminophen, polyethylene glycol, polyvinyl alcohol, senna-docusate Continuous Infusions:   LOS: 16 days  Time spent: Greater than 50% of the 35 minute visit was spent in counseling/coordination of care for the patient as laid out in the A&P.   Dwyane Dee, MD [facility] 04/11/2020, 1:25 PM

## 2020-04-12 ENCOUNTER — Ambulatory Visit
Admit: 2020-04-12 | Discharge: 2020-04-12 | Disposition: A | Discharge status: Home / Self Care | Payer: Medicare HMO | Attending: Radiation Oncology | Admitting: Radiation Oncology

## 2020-04-12 DIAGNOSIS — R21 Rash and other nonspecific skin eruption: Secondary | ICD-10-CM

## 2020-04-12 DIAGNOSIS — K5900 Constipation, unspecified: Secondary | ICD-10-CM

## 2020-04-12 LAB — GLUCOSE, CAPILLARY
Glucose-Capillary: 125 mg/dL — ABNORMAL HIGH (ref 70–99)
Glucose-Capillary: 90 mg/dL (ref 70–99)
Glucose-Capillary: 113 mg/dL — ABNORMAL HIGH (ref 70–99)
Glucose-Capillary: 139 mg/dL — ABNORMAL HIGH (ref 70–99)
Glucose-Capillary: 86 mg/dL (ref 70–99)

## 2020-04-12 MED ORDER — CLOBETASOL PROPIONATE 0.05 % EX OINT
TOPICAL_OINTMENT | Freq: Two times a day (BID) | CUTANEOUS | Status: DC
  Filled 2020-04-12 (×3): qty 15

## 2020-04-12 NOTE — Progress Notes (Addendum)
HEMATOLOGY-ONCOLOGY PROGRESS NOTE  SUBJECTIVE: Reports improved sacral pain.  Reports that she is not getting out of bed because she is unsteady on her feet.  Reports tremors.  Due to complete radiation this Thursday and discharge to SNF.  PHYSICAL EXAMINATION:  Vitals:   04/11/20 2131 04/12/20 0608  BP: 119/74 117/71  Pulse: 66 66  Resp: 16 16  Temp: 97.6 F (36.4 C) 97.8 F (36.6 C)  SpO2: 90% 97%   Filed Weights   03/24/20 2140 04/01/20 0448  Weight: 111.9 kg 90 kg    Intake/Output from previous day: 12/13 0701 - 12/14 0700 In: 360 [P.O.:360] Out: -   GENERAL: Chronically ill-appearing female, no distress OROPHARYNX:no exudate, no erythema and lips, buccal mucosa, and tongue normal  LUNGS: clear to auscultation and percussion with normal breathing effort HEART: regular rate & rhythm and no murmurs and no lower extremity edema ABDOMEN:abdomen soft, non-tender and normal bowel sounds NEURO: alert & oriented x 3 with fluent speech, no focal motor/sensory deficits  LABORATORY DATA:  I have reviewed the data as listed CMP Latest Ref Rng & Units 04/06/2020 04/05/2020 04/04/2020  Glucose 70 - 99 mg/dL 86 121(H) 77  BUN 8 - 23 mg/dL 25(H) 23 23  Creatinine 0.44 - 1.00 mg/dL 0.99 1.00 1.06(H)  Sodium 135 - 145 mmol/L 140 140 140  Potassium 3.5 - 5.1 mmol/L 4.1 3.9 4.2  Chloride 98 - 111 mmol/L 100 99 98  CO2 22 - 32 mmol/L _0 Calcium 8.9 - 10.3 mg/dL 9.4 9.5 9.8  Total Protein 6.5 - 8.1 g/dL - - 7.7  Total Bilirubin 0.3 - 1.2 mg/dL - - 1.2  Alkaline Phos 38 - 126 U/L - - 122  AST 15 - 41 U/L - - 38  ALT 0 - 44 U/L - - 14    Lab Results  Component Value Date   WBC 5.9 04/06/2020   HGB 13.7 04/06/2020   HCT 45.2 04/06/2020   MCV 96.2 04/06/2020   PLT 175 04/06/2020   NEUTROABS 3.1 04/06/2020    MR THORACIC SPINE WO CONTRAST  Result Date: 03/25/2020 CLINICAL DATA:  Initial evaluation for low back pain, cauda equina syndrome suspected. EXAM: MRI THORACIC AND  LUMBAR SPINE WITHOUT CONTRAST TECHNIQUE: Multiplanar and multiecho pulse sequences of the thoracic and lumbar spine were obtained without intravenous contrast. COMPARISON:  Prior CT from 06/05/2019 FINDINGS: MRI THORACIC SPINE FINDINGS Alignment:  Examination degraded by motion artifact. Vertebral bodies normally aligned with preservation of the normal thoracic kyphosis. No listhesis or static subluxation. Vertebrae: Vertebral body height maintained without acute or chronic fracture. Bone marrow signal intensity mildly heterogeneous without worrisome osseous lesion. No evidence for metastatic disease within the thoracic spine. Subcentimeter benign hemangioma noted within the T10 vertebral body. Cord:  Normal signal and morphology on this motion degraded exam. Paraspinal and other soft tissues: Paraspinous soft tissues demonstrate no acute finding. Probable scattered atelectatic changes noted within the partially visualized lungs. Visualized visceral structures otherwise grossly unremarkable. Disc levels: Normal expected for age multilevel disc desiccation with mild annular disc bulging, most notable at T4-5, T10-11, and T11-12. No significant spinal stenosis or evidence for cord deformity or impingement. Foramina remain patent. No neural impingement. MRI LUMBAR SPINE FINDINGS Segmentation:  Examination moderately degraded by motion artifact. Standard segmentation. Lowest well-formed disc space labeled the L5-S1 level. Alignment: Levoscoliosis. Trace 2 mm retrolisthesis of L3 on L4, with trace 2 mm anterolisthesis of L4 on L5, chronic and facet mediated. Alignment otherwise normal  with preservation of the normal lumbar lordosis. Vertebrae: There is an abnormal T2/STIR hyperintense lesion involving the central and left aspect of the sacrum, concerning for osseous metastatic disease (series 2, image 9). This appears to be new as compared to most recent available CT from 06/05/2019. Probable early invasion of the  adjacent left S1 and possibly S2 neural foramina, partially visualized (series 5, image 26). Possible early invasion of the right S1 neural foramen noted as well. Probable extraosseous extension into the adjacent presacral space noted on sagittal view (series 2, image 13). No visible pathologic fracture. No other worrisome osseous lesions or evidence for metastatic disease seen within the lumbar spine. Underlying bone marrow signal intensity somewhat diffusely heterogeneous. Vertebral body height maintained without acute or chronic fracture. No other abnormal marrow edema. Conus medullaris and cauda equina: Conus extends to the L1-2 level. Conus and cauda equina appear normal. Paraspinal and other soft tissues: Paraspinous soft tissues demonstrate no acute finding on this motion degraded exam. There is question of a heterogeneous lesion within the interpolar right kidney measuring 2.0 x 1.8 cm (series 4, image 5), not well evaluated on this motion degraded and noncontrast examination. No lesion seen at this location on prior CT. Remainder of the visualized visceral structures otherwise unremarkable. Disc levels: L1-2: Diffuse disc bulge with disc desiccation and intervertebral disc space narrowing. Superimposed central to left subarticular disc protrusion indents the ventral thecal sac (series 4, image 7). Resultant mild to moderate spinal stenosis without frank cord impingement. Foramina remain patent. L2-3: Mild diffuse disc bulge with disc desiccation. Disc bulge eccentric to the right. Mild bilateral facet hypertrophy. Resultant mild canal with right subarticular stenosis. Foramina remain patent. L3-4: Mild diffuse disc bulge with disc desiccation. Disc bulge eccentric to the left. Moderate bilateral facet hypertrophy. Resultant moderate canal with left lateral recess stenosis. Mild left L3 foraminal narrowing. Right neural foramina remains patent. L4-5: Trace anterolisthesis. Diffuse disc bulge with disc  desiccation. Disc bulge asymmetric to the left. Superimposed small central to left foraminal disc extrusion with superior migration (series 18, image 8). Moderate bilateral facet and ligament flavum hypertrophy. Associated small joint effusion on the left. Resultant severe canal with bilateral subarticular stenosis. Thecal sac measures 5 mm in AP diameter at its most narrow point. Moderate left L4 foraminal stenosis. No significant right foraminal narrowing. L5-S1: Degenerative intervertebral disc space narrowing with diffuse disc bulge and disc desiccation. Reactive endplate change with marginal endplate osteophytic spurring. Broad posterior disc osteophyte contacts the descending S1 nerve roots as they course through the lateral recesses bilaterally (series 4, image 23). Mild facet hypertrophy. Resultant mild to moderate bilateral lateral recess narrowing. Central canal remains patent. Mild right worse than left L5 foraminal stenosis. IMPRESSION: MR THORACIC SPINE IMPRESSION: 1. Motion degraded exam. 2. No acute abnormality within the thoracic spine. No evidence for significant disc pathology, stenosis, or evidence for neural impingement. No metastatic disease. MR LUMBAR SPINE IMPRESSION: 1. Abnormal T2/STIR hyperintense lesion involving the central and left aspect of the sacrum, concerning for osseous metastatic disease. Probable early invasion of the adjacent left S1 and S2 neural foramina, with extraosseous extension into the adjacent presacral space. No visible pathologic fracture. Finding could be further assessed with dedicated MRI of the sacrum/pelvis as clinically warranted. 2. No other metastatic disease within the lumbar spine. 3. 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 4. Left eccentric disc bulge and facet hypertrophy at L3-4 with resultant  mild to moderate canal and left lateral recess  stenosis. 5. Central to left subarticular disc protrusion at L1-2 with resultant mild to moderate spinal stenosis. Electronically Signed   By: Jeannine Boga M.D.   On: 03/25/2020 01:13   MR LUMBAR SPINE WO CONTRAST  Result Date: 03/25/2020 CLINICAL DATA:  Initial evaluation for low back pain, cauda equina syndrome suspected. EXAM: MRI THORACIC AND LUMBAR SPINE WITHOUT CONTRAST TECHNIQUE: Multiplanar and multiecho pulse sequences of the thoracic and lumbar spine were obtained without intravenous contrast. COMPARISON:  Prior CT from 06/05/2019 FINDINGS: MRI THORACIC SPINE FINDINGS Alignment:  Examination degraded by motion artifact. Vertebral bodies normally aligned with preservation of the normal thoracic kyphosis. No listhesis or static subluxation. Vertebrae: Vertebral body height maintained without acute or chronic fracture. Bone marrow signal intensity mildly heterogeneous without worrisome osseous lesion. No evidence for metastatic disease within the thoracic spine. Subcentimeter benign hemangioma noted within the T10 vertebral body. Cord:  Normal signal and morphology on this motion degraded exam. Paraspinal and other soft tissues: Paraspinous soft tissues demonstrate no acute finding. Probable scattered atelectatic changes noted within the partially visualized lungs. Visualized visceral structures otherwise grossly unremarkable. Disc levels: Normal expected for age multilevel disc desiccation with mild annular disc bulging, most notable at T4-5, T10-11, and T11-12. No significant spinal stenosis or evidence for cord deformity or impingement. Foramina remain patent. No neural impingement. MRI LUMBAR SPINE FINDINGS Segmentation:  Examination moderately degraded by motion artifact. Standard segmentation. Lowest well-formed disc space labeled the L5-S1 level. Alignment: Levoscoliosis. Trace 2 mm retrolisthesis of L3 on L4, with trace 2 mm anterolisthesis of L4 on L5, chronic and facet mediated.  Alignment otherwise normal with preservation of the normal lumbar lordosis. Vertebrae: There is an abnormal T2/STIR hyperintense lesion involving the central and left aspect of the sacrum, concerning for osseous metastatic disease (series 2, image 9). This appears to be new as compared to most recent available CT from 06/05/2019. Probable early invasion of the adjacent left S1 and possibly S2 neural foramina, partially visualized (series 5, image 26). Possible early invasion of the right S1 neural foramen noted as well. Probable extraosseous extension into the adjacent presacral space noted on sagittal view (series 2, image 13). No visible pathologic fracture. No other worrisome osseous lesions or evidence for metastatic disease seen within the lumbar spine. Underlying bone marrow signal intensity somewhat diffusely heterogeneous. Vertebral body height maintained without acute or chronic fracture. No other abnormal marrow edema. Conus medullaris and cauda equina: Conus extends to the L1-2 level. Conus and cauda equina appear normal. Paraspinal and other soft tissues: Paraspinous soft tissues demonstrate no acute finding on this motion degraded exam. There is question of a heterogeneous lesion within the interpolar right kidney measuring 2.0 x 1.8 cm (series 4, image 5), not well evaluated on this motion degraded and noncontrast examination. No lesion seen at this location on prior CT. Remainder of the visualized visceral structures otherwise unremarkable. Disc levels: L1-2: Diffuse disc bulge with disc desiccation and intervertebral disc space narrowing. Superimposed central to left subarticular disc protrusion indents the ventral thecal sac (series 4, image 7). Resultant mild to moderate spinal stenosis without frank cord impingement. Foramina remain patent. L2-3: Mild diffuse disc bulge with disc desiccation. Disc bulge eccentric to the right. Mild bilateral facet hypertrophy. Resultant mild canal with right  subarticular stenosis. Foramina remain patent. L3-4: Mild diffuse disc bulge with disc desiccation. Disc bulge eccentric to the left. Moderate bilateral facet hypertrophy. Resultant moderate canal with left lateral  recess stenosis. Mild left L3 foraminal narrowing. Right neural foramina remains patent. L4-5: Trace anterolisthesis. Diffuse disc bulge with disc desiccation. Disc bulge asymmetric to the left. Superimposed small central to left foraminal disc extrusion with superior migration (series 18, image 8). Moderate bilateral facet and ligament flavum hypertrophy. Associated small joint effusion on the left. Resultant severe canal with bilateral subarticular stenosis. Thecal sac measures 5 mm in AP diameter at its most narrow point. Moderate left L4 foraminal stenosis. No significant right foraminal narrowing. L5-S1: Degenerative intervertebral disc space narrowing with diffuse disc bulge and disc desiccation. Reactive endplate change with marginal endplate osteophytic spurring. Broad posterior disc osteophyte contacts the descending S1 nerve roots as they course through the lateral recesses bilaterally (series 4, image 23). Mild facet hypertrophy. Resultant mild to moderate bilateral lateral recess narrowing. Central canal remains patent. Mild right worse than left L5 foraminal stenosis. IMPRESSION: MR THORACIC SPINE IMPRESSION: 1. Motion degraded exam. 2. No acute abnormality within the thoracic spine. No evidence for significant disc pathology, stenosis, or evidence for neural impingement. No metastatic disease. MR LUMBAR SPINE IMPRESSION: 1. Abnormal T2/STIR hyperintense lesion involving the central and left aspect of the sacrum, concerning for osseous metastatic disease. Probable early invasion of the adjacent left S1 and S2 neural foramina, with extraosseous extension into the adjacent presacral space. No visible pathologic fracture. Finding could be further assessed with dedicated MRI of the sacrum/pelvis  as clinically warranted. 2. No other metastatic disease within the lumbar spine. 3. 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 4. Left eccentric disc bulge and facet hypertrophy at L3-4 with resultant mild to moderate canal and left lateral recess stenosis. 5. Central to left subarticular disc protrusion at L1-2 with resultant mild to moderate spinal stenosis. Electronically Signed   By: Jeannine Boga M.D.   On: 03/25/2020 01:13   CT CHEST ABDOMEN PELVIS W CONTRAST  Result Date: 03/25/2020 CLINICAL DATA:  Left sacral lesion on MR. History of sigmoid colon cancer. EXAM: CT CHEST, ABDOMEN, AND PELVIS WITH CONTRAST TECHNIQUE: Multidetector CT imaging of the chest, abdomen and pelvis was performed following the standard protocol during bolus administration of intravenous contrast. CONTRAST:  149m OMNIPAQUE IOHEXOL 300 MG/ML  SOLN COMPARISON:  CT chest abdomen pelvis dated 06/05/2019. MRI thoracolumbar spine dated 03/25/2020. FINDINGS: CT CHEST FINDINGS Cardiovascular: Heart is normal in size.  No pericardial effusion. No evidence of thoracic aortic aneurysm. Mediastinum/Nodes: No suspicious mediastinal lymphadenopathy. Visualized thyroid is enlarged/heterogeneous. Lungs/Pleura: Trace right pleural effusion. No suspicious pulmonary nodules. Mild scarring/fibrosis in the right upper lobe. No pleural effusion or pneumothorax. Musculoskeletal: No focal osseous lesions. CT ABDOMEN PELVIS FINDINGS Hepatobiliary: Cirrhosis.  No suspicious/enhancing hepatic lesions. Numerous layering gallstones (series 4/image 64), without associated inflammatory changes. Pancreas: Within normal limits. Spleen: Enlarged, measuring 15.3 cm in maximal craniocaudal dimension. Adrenals/Urinary Tract: Adrenal glands are within normal limits. 2.6 cm hypoenhancing mass in the right upper kidney (series 4/image 65), essentially new.  This appearance may reflect infection, but if neoplastic, would favor metastasis over renal cell carcinoma. Left kidney is within normal limits.  No hydronephrosis. Bladder is within normal limits. Stomach/Bowel: Stomach is within normal limits. No evidence of bowel obstruction. Appendix is not discretely visualized. Status post left hemicolectomy with suture line in the lower abdomen (series 4/image 95). Vascular/Lymphatic: No evidence of abdominal aortic aneurysm. Atherosclerotic calcifications of the abdominal aorta and branch vessels. No suspicious abdominopelvic lymphadenopathy. Reproductive: Uterus is within normal limits.  Left ovary is within normal limits. Right ovary is notable for a 3.2 cm cyst/follicle, previously 3.5 cm. Other: No abdominopelvic ascites. Musculoskeletal: Destructive left sacral lesion (series 4/image 100), better evaluated on recent MRI. Associated 2.8 x 3.5 cm soft tissue component anterior to the left sacrum (series 4/image 105), previously 2.1 x 2.6 cm. IMPRESSION: Status post left hemicolectomy. Destructive left sacral lesion, better evaluated on recent MRI. Associated 2.8 x 3.5 cm soft tissue component anterior to the left sacrum, mildly increased. This appearance remains worrisome for metastasis. 2.6 cm hypoenhancing mass in the right upper kidney, essentially new. This appearance may reflect infection, but if neoplastic, would favor metastasis over renal cell carcinoma. Trace right pleural effusion. Cirrhosis. Splenomegaly. No abdominopelvic ascites. Cholelithiasis, without associated inflammatory changes. Electronically Signed   By: Julian Hy M.D.   On: 03/25/2020 13:35   CT BIOPSY  Result Date: 03/28/2020 INDICATION: History of colon cancer, enlarging sacral lesion concerning for metastatic disease EXAM: CT core BIOPSY LEFT SACRAL MASS MEDICATIONS: 1% LIDOCAINE LOCAL ANESTHESIA/SEDATION: Moderate (conscious) sedation was employed during this procedure. A total of  Versed 1.5 mg and Fentanyl 50 mcg was administered intravenously. Moderate Sedation Time: 15 minutes. The patient's level of consciousness and vital signs were monitored continuously by radiology nursing throughout the procedure under my direct supervision. FLUOROSCOPY TIME:  Fluoroscopy Time: NONE. COMPLICATIONS: None immediate. PROCEDURE: Informed written consent was obtained from the patient after a thorough discussion of the procedural risks, benefits and alternatives. All questions were addressed. Maximal Sterile Barrier Technique was utilized including caps, mask, sterile gowns, sterile gloves, sterile drape, hand hygiene and skin antiseptic. A timeout was performed prior to the initiation of the procedure. previous imaging reviewed. patient positioned prone. noncontrast localization CT performed. the destructive left sacral soft tissue mass was localized and marked for a posterior approach. Under sterile conditions and local anesthesia, a 17 gauge coaxial guide was advanced to the left sacral lesion soft tissue component. Needle position confirmed with CT. Several 18 gauge core biopsies attempted. Sampling was fragmented and placed in formalin. Needle removed. Postprocedure imaging demonstrates no hemorrhage or hematoma. Patient tolerated biopsy well. IMPRESSION: Successful CT-guided left sacral mass 18 gauge core biopsy Electronically Signed   By: Jerilynn Mages.  Shick M.D.   On: 03/28/2020 16:11   DG Knee Complete 4 Views Left  Result Date: 03/24/2020 CLINICAL DATA:  Pain EXAM: LEFT KNEE - COMPLETE 4+ VIEW COMPARISON:  None. FINDINGS: There are moderate tricompartmental degenerative changes, greatest within the medial compartment. There is no significant joint effusion. No acute displaced fracture or dislocation. IMPRESSION: Moderate tricompartmental degenerative changes, greatest within the medial compartment. Electronically Signed   By: Constance Holster M.D.   On: 03/24/2020 23:35   DG Hip Unilat W or Wo  Pelvis 2-3 Views Left  Result Date: 03/24/2020 CLINICAL DATA:  Pain status post fall EXAM: DG HIP (WITH OR WITHOUT PELVIS) 2-3V LEFT COMPARISON:  None. FINDINGS: There is mild bilateral hip osteoarthritis. There is no acute displaced fracture or dislocation. There are calcifications projecting over the patient's pelvis that are favored to represent phleboliths. IMPRESSION: Mild bilateral hip osteoarthritis. No acute displaced fracture or dislocation. Electronically Signed   By: Constance Holster M.D.   On: 03/24/2020 23:32    ASSESSMENT AND PLAN: 1.  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, status post sigmoidectomy 08/14/2019 - [facility] Stable   Left sacral mass consistent with colorectal primary -MRI of the thoracic and lumbar spine 03/24/2020 -abnormal  T2/STIR hyperintense lesion involving the central and left aspect of the sacrum concerning for osseous metastatic disease, probable invasion of adjacent left S1-S2 neural foramina, extraosseous extension into the adjacent presacral space -CT chest/abdomen/pelvis 03/25/2020-destructive left sacral lesion, associated 2.8 x 3.5 cm soft tissue component anterior to the left sacrum worrisome for metastasis, 2.6 cm hypoenhancing mass in the right upper kidney-?  Metastasis versus renal cell carcinoma -CT-guided biopsy of left sacral mass 03/28/2020-metastatic adenocarcinoma consistent with GI primary 2.  History of iron deficiency anemia 3.  Cirrhosis with splenomegaly 5.  Left lower extremity DVT diagnosed 2019 6.  Hypertension 7.  Diabetes mellitus 8.  Benign essential tremor 9.  CKD 10.  Hyperlipidemia 11.  Morbid obesity 12.  Anxiety and depression 13.  Tobacco dependence 14.  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  Pain continues to improve with palliative radiation to her sacral mass.  Due to complete radiation this Thursday.  Thereafter, she will discharge to  SNF.  Recommend for nursing and PT to assist the patient with ambulation.  The patient's biopsy has been submitted for foundation 1 testing.  This is currently pending.  She has multiple comorbid conditions which may limit treatment options.  We will discuss systemic treatment options once discharged.  She is scheduled for outpatient follow-up with a catheter on 04/25/2020.  Her sister was updated at the bedside on 04/11/2020.  Recommendations: 1.  Recommend for the patient to ambulate. 2.  Await results of foundation 1 testing. 3.  Continue palliative radiation to sacral mass. 4.  Outpatient follow-up scheduled on 04/25/2020 to discuss systemic treatment options.   LOS: 17 days   Mikey Bussing, DNP, AGPCNP-BC, AOCNP 04/12/20 Tonya Dougherty was interviewed and examined.  Her pain has improved since beginning palliative radiation to the sacrum.  She remains nonambulatory.  Her leg strength appears intact.  I recommend she increase physical therapy as tolerated.  We are waiting on results of Foundation 1 testing to decide on systemic treatment options.  Outpatient follow-up will be scheduled at the Cancer center in approximately 2 weeks.  I discussed the diagnosis of metastatic colon cancer, the prognosis, and treatment options with Tonya Dougherty and her sister at the bedside on 04/11/2020.

## 2020-04-12 NOTE — Progress Notes (Signed)
Patient ID: Tonya Dougherty, female   DOB: 1951-04-02, 69 y.o.   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 CT chest, abdomen pelvis showed destructive left sacral lesion with associated 2.8 x 3.5 cm soft tissue component anterior to the left sacrum worrisome for metastasis. 2.6 cm hypoenhancing mass in the right upper kidney.  CT-guided biopsies on 03/28/20 of sacral lesion showing metastatic adenocarcinoma consistent with gastrointestinal primary.  Oncology was consulted. She was transferred to [facility] long for initiation of XRT.    Assessment & Plan:   Ambulatory dysfunction due to metastases to the sacrum with invasion of S1 and S2 neural foramina from metastatic colon adenocarcinoma -Patient has history of robot-assisted sigmoidectomy on 07/2019 -Oncology and radiation oncology following.  Patient is currently undergoing palliative XRT till Thursday. -Continue pain management and bowel regimen. -PT recommending SNF placement.  Possible discharge to SNF after completion of radiation therapy on Thursday.  Social worker following -Palliative care consultation for goals of care discussion  Diabetes mellitus type II with hyperglycemia -Continue Lantus.  Continue CBGs with SSI.  Iron deficiency anemia -Hemoglobin was stable.  Continue oral iron supplementation  Constipation -Continue bowel regimen  Sacral lesion -Continue wound care and turning  often  Stage II left and right sacral pressure injury: Present on admission -Continue local wound care  History of DVT (deep vein thrombosis) -Continue Eliquis  Tobacco abuse -Continue nicotine patch  Hyperlipidemia -Continue statin  GERD without esophagitis -Continue PPI  Essential hypertension - continue coreg  Acute cystitis without hematuria - completed antibiotics   DVT prophylaxis: Eliquis Code Status: Full Family Communication: None at bedside Disposition Plan: Status is: Inpatient  Remains inpatient appropriate because:Inpatient level of care appropriate due to severity of illness   Dispo: The patient is from: Home              Anticipated d/c is to: SNF              Anticipated d/c date is: 2 days              Patient currently is medically stable to d/c.  Discharge once radiation is completed   Consultants: Oncology/radiation oncology Procedures: Sacral CT biopsy  Antimicrobials: None   Subjective: Patient seen and examined at bedside.  Complains of rash at baseline of the body but states that she has chronic skin condition?  Psoriasis this rash is getting worse for the last few days.  Poor historian.  No overnight fever, vomiting reported.  Denies worsening shortness of breath. Objective: Vitals:   04/11/20 0612 04/11/20 1449 04/11/20 2131 04/12/20 0608  BP: (!) 109/51 115/62 119/74 117/71  Pulse: 65 79 66 66  Resp: 16 16 16 16   Temp: 98 F (36.7 C) 98.1 F (36.7 C) 97.6 F (36.4 C) 97.8 F (36.6 C)  TempSrc: Oral Oral Oral Oral  SpO2: (!) 89% 91% 90% 97%  Weight:      Height:        Intake/Output Summary (Last 24 hours) at  04/12/2020 1220 Last data filed at 04/11/2020 1700 Gross per 24 hour  Intake 360 ml  Output --  Net 360 ml   Filed Weights   03/24/20 2140 04/01/20 0448  Weight: 111.9 kg 90 kg    Examination:  General exam: Appears calm and comfortable.  Poor historian.  Chronically ill looking female lying in  bed. Respiratory system: Bilateral decreased breath sounds at bases with some scattered crackles Cardiovascular system: S1 & S2 heard, Rate controlled Gastrointestinal system: Abdomen is nondistended, soft and nontender. Normal bowel sounds heard. Extremities: No cyanosis, clubbing; trace lower extremity edema present Central nervous system: Alert and oriented. No focal neurological deficits. Moving extremities Skin: Generalized rash over various areas of the body, maculopapular with some ulcerations and scabbing with chronic skin lesions on the abdomen Psychiatry: Flat affect    Data Reviewed: I have personally reviewed following labs and imaging studies  CBC: Recent Labs  Lab 04/06/20 0712  WBC 5.9  NEUTROABS 3.1  HGB 13.7  HCT 45.2  MCV 96.2  PLT 253   Basic Metabolic Panel: Recent Labs  Lab 04/06/20 0712  NA 140  K 4.1  CL 100  CO2 30  GLUCOSE 86  BUN 25*  CREATININE 0.99  CALCIUM 9.4  MG 2.6*   GFR: Estimated Creatinine Clearance: 56 mL/min (by C-G formula based on SCr of 0.99 mg/dL). Liver Function Tests: No results for input(s): AST, ALT, ALKPHOS, BILITOT, PROT, ALBUMIN in the last 168 hours. No results for input(s): LIPASE, AMYLASE in the last 168 hours. No results for input(s): AMMONIA in the last 168 hours. Coagulation Profile: No results for input(s): INR, PROTIME in the last 168 hours. Cardiac Enzymes: No results for input(s): CKTOTAL, CKMB, CKMBINDEX, TROPONINI in the last 168 hours. BNP (last 3 results) No results for input(s): PROBNP in the last 8760 hours. HbA1C: No results for input(s): HGBA1C in the last 72 hours. CBG: Recent Labs  Lab 04/11/20 1248 04/11/20 1607 04/11/20 2159 04/12/20 0733 04/12/20 1212  GLUCAP 173* 116* 125* 90 113*   Lipid Profile: No results for input(s): CHOL, HDL, LDLCALC, TRIG, CHOLHDL, LDLDIRECT in the last 72 hours. Thyroid Function Tests: No results for input(s): TSH, T4TOTAL, FREET4, T3FREE, THYROIDAB in  the last 72 hours. Anemia Panel: No results for input(s): VITAMINB12, FOLATE, FERRITIN, TIBC, IRON, RETICCTPCT in the last 72 hours. Sepsis Labs: No results for input(s): PROCALCITON, LATICACIDVEN in the last 168 hours.  No results found for this or any previous visit (from the past 240 hour(s)).       Radiology Studies: No results found.      Scheduled Meds: . apixaban  5 mg Oral BID  . carvedilol  3.125 mg Oral BID WC  . citalopram  30 mg Oral QHS  . feeding supplement  237 mL Oral BID BM  . ferrous sulfate  325 mg Oral Q breakfast  . gabapentin  600 mg Oral TID  . insulin aspart  0-15 Units Subcutaneous TID AC & HS  . insulin glargine  40 Units Subcutaneous Q2200  . multivitamin with minerals  1 tablet Oral Daily  . nystatin   Topical BID  . oxyCODONE  10 mg Oral Q12H  . pantoprazole  40 mg Oral Daily  . polyethylene glycol  17 g Oral BID  . Zinc Oxide   Topical BID   Continuous Infusions:        Aline August, MD [facility] 04/12/2020, 12:20 PM

## 2020-04-12 NOTE — Progress Notes (Signed)
Physical Therapy Treatment Patient Details Name: Tonya Dougherty MRN: [id number] DOB: 15-May-1950 Today's Date: 04/12/2020    History of Present Illness Pt is a 69 y.o. female with a medical history significant for adenocarcinoma of sigmoid colon s/p resection 08/9561, nonalcoholic fatty liver disease, DM2, HTN, DVT (Eliquis), admitted 03/24/20 with progressive LLE pain radiating from L buttock to foot over the last 3 months that has gotten worse acutely. Imaging showed destructive L sacral lesion found to be metastatic adenocarcinoma.  Pt started on radiation treatments.    PT Comments    Patient recently returned to bed , had large BM. Patient  Assisted with strengthening exercises.  patient demonstrates decreased control of movements, has frequent jerking of extremities, both legs and arms. Patient reports that at times her arms with just "Fly up and hit someone. " Continur progressive mobility.   Follow Up Recommendations  SNF;Supervision/Assistance - 24 hour     Equipment Recommendations  3in1 (PT);Wheelchair (measurements PT);Wheelchair cushion (measurements PT)    Recommendations for Other Services       Precautions / Restrictions Precautions Precautions: Fall Precaution Comments: has fallen twice with nursing, may be incontinent    Mobility  Bed Mobility               General bed mobility comments: deferred, patient recently had a large bowel movement so deferred, Also was in recliner via lift x 3 hours and recnetly back to bed  Transfers                    Ambulation/Gait                 Stairs             Wheelchair Mobility    Modified Rankin (Stroke Patients Only)       Balance                                            Cognition Arousal/Alertness: Awake/alert Behavior During Therapy: Restless;Impulsive Overall Cognitive Status: No family/caregiver present to determine baseline cognitive functioning Area of  Impairment: Safety/judgement;Following commands;Memory                       Following Commands: Follows one step commands with increased time Safety/Judgement: Decreased awareness of safety     General Comments: Pt seems to wax and wane in her ability to follow directions.      Exercises General Exercises - Lower Extremity Ankle Circles/Pumps: AROM Short Arc Quad: AROM;Both;10 reps;Supine Heel Slides: AAROM;Both;10 reps;Supine Straight Leg Raises: AROM;Both;10 reps;Supine    General Comments        Pertinent Vitals/Pain Pain Assessment: No/denies pain    Home Living                      Prior Function            PT Goals (current goals can now be found in the care plan section) Progress towards PT goals: Progressing toward goals    Frequency    Min 2X/week      PT Plan Current plan remains appropriate    Co-evaluation              AM-PAC PT "6 Clicks" Mobility   Outcome Measure  Help needed turning from your back to your side while in a  flat bed without using bedrails?: A Little Help needed moving from lying on your back to sitting on the side of a flat bed without using bedrails?: A Little Help needed moving to and from a bed to a chair (including a wheelchair)?: A Lot Help needed standing up from a chair using your arms (e.g., wheelchair or bedside chair)?: A Lot Help needed to walk in hospital room?: A Lot Help needed climbing 3-5 steps with a railing? : Total 6 Click Score: 13    End of Session   Activity Tolerance: Patient tolerated treatment well Patient left: in bed;with call bell/phone within reach Nurse Communication: Mobility status;Need for lift equipment PT Visit Diagnosis: Unsteadiness on feet (R26.81);Muscle weakness (generalized) (M62.81);Other abnormalities of gait and mobility (R26.89)     Time: 1540-1601 PT Time Calculation (min) (ACUTE ONLY): 21 min  Charges:  $Therapeutic Exercise: 8-22 mins                      Tresa Endo PT Acute Rehabilitation Services Pager [phone] Office [phone]    Claretha Cooper 04/12/2020, 4:21 PM

## 2020-04-13 ENCOUNTER — Ambulatory Visit
Admit: 2020-04-13 | Discharge: 2020-04-13 | Disposition: A | Discharge status: Home / Self Care | Payer: Medicare HMO | Attending: Radiation Oncology | Admitting: Radiation Oncology

## 2020-04-13 ENCOUNTER — Ambulatory Visit: Payer: Medicare HMO | Admitting: Nurse Practitioner

## 2020-04-13 LAB — COMPREHENSIVE METABOLIC PANEL
ALT: 20 U/L (ref 0–44)
AST: 42 U/L — ABNORMAL HIGH (ref 15–41)
Albumin: 2.9 g/dL — ABNORMAL LOW (ref 3.5–5.0)
Alkaline Phosphatase: 121 U/L (ref 38–126)
Anion gap: 9 (ref 5–15)
BUN: 26 mg/dL — ABNORMAL HIGH (ref 8–23)
CO2: 27 mmol/L (ref 22–32)
Calcium: 9.4 mg/dL (ref 8.9–10.3)
Chloride: 103 mmol/L (ref 98–111)
Creatinine, Ser: 0.92 mg/dL (ref 0.44–1.00)
GFR, Estimated: >60 mL/min (ref >60)
Glucose, Bld: 105 mg/dL — ABNORMAL HIGH (ref 70–99)
Potassium: 4 mmol/L (ref 3.5–5.1)
Sodium: 139 mmol/L (ref 135–145)
Total Bilirubin: 1.4 mg/dL — ABNORMAL HIGH (ref 0.3–1.2)
Total Protein: 6.8 g/dL (ref 6.5–8.1)

## 2020-04-13 LAB — CBC WITH DIFFERENTIAL/PLATELET
Abs Immature Granulocytes: 0.01 10*3/uL (ref 0.00–0.07)
Basophils Absolute: 0 10*3/uL (ref 0.0–0.1)
Basophils Relative: 1 %
Eosinophils Absolute: 0.1 10*3/uL (ref 0.0–0.5)
Eosinophils Relative: 4 %
HCT: 42.5 % (ref 36.0–46.0)
Hemoglobin: 13.1 g/dL (ref 12.0–15.0)
Immature Granulocytes: 0 %
Lymphocytes Relative: 24 %
Lymphs Abs: 0.9 10*3/uL (ref 0.7–4.0)
MCH: 30 pg (ref 26.0–34.0)
MCHC: 30.8 g/dL (ref 30.0–36.0)
MCV: 97.5 fL (ref 80.0–100.0)
Monocytes Absolute: 0.6 10*3/uL (ref 0.1–1.0)
Monocytes Relative: 17 %
Neutro Abs: 1.9 10*3/uL (ref 1.7–7.7)
Neutrophils Relative %: 54 %
Platelets: 139 10*3/uL — ABNORMAL LOW (ref 150–400)
RBC: 4.36 MIL/uL (ref 3.87–5.11)
RDW: 20.7 % — ABNORMAL HIGH (ref 11.5–15.5)
WBC: 3.6 10*3/uL — ABNORMAL LOW (ref 4.0–10.5)
nRBC: 0 % (ref 0.0–0.2)

## 2020-04-13 LAB — GLUCOSE, CAPILLARY
Glucose-Capillary: 106 mg/dL — ABNORMAL HIGH (ref 70–99)
Glucose-Capillary: 169 mg/dL — ABNORMAL HIGH (ref 70–99)
Glucose-Capillary: 154 mg/dL — ABNORMAL HIGH (ref 70–99)
Glucose-Capillary: 113 mg/dL — ABNORMAL HIGH (ref 70–99)

## 2020-04-13 LAB — SARS CORONAVIRUS 2 BY RT PCR (HOSPITAL ORDER, PERFORMED IN ~~LOC~~ HOSPITAL LAB): SARS Coronavirus 2: NEGATIVE

## 2020-04-13 LAB — MAGNESIUM: Magnesium: 2.4 mg/dL (ref 1.7–2.4)

## 2020-04-13 MED ORDER — DIPHENHYDRAMINE HCL 25 MG PO CAPS: 25.0000 mg | ORAL_CAPSULE | Freq: Four times a day (QID) | ORAL | Status: DC | PRN

## 2020-04-13 NOTE — Progress Notes (Signed)
Patient ID: Tonya Dougherty, female   DOB: November 06, 1950, 69 y.o.   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 CT chest, abdomen pelvis showed destructive left sacral lesion with associated 2.8 x 3.5 cm soft tissue component anterior to the left sacrum worrisome for metastasis. 2.6 cm hypoenhancing mass in the right upper kidney.  CT-guided biopsies on 03/28/20 of sacral lesion showing metastatic adenocarcinoma consistent with gastrointestinal primary.  Oncology was consulted. She was transferred to [place] long for initiation of XRT.    Assessment & Plan:   Ambulatory dysfunction due to metastases to the sacrum with invasion of S1 and S2 neural foramina from metastatic colon adenocarcinoma -Patient has history of robot-assisted sigmoidectomy on 07/2019 -Oncology and radiation oncology following.  Patient is currently undergoing palliative XRT till Thursday. -Continue pain management and bowel regimen. -PT recommending SNF placement.  Possible discharge to SNF after completion of radiation therapy on Thursday.  Social worker following -Palliative care consultation for goals of care discussion is pending  Diabetes mellitus type II with hyperglycemia -Continue Lantus.  Continue CBGs with SSI.  Iron deficiency anemia -Hemoglobin stable.  Continue oral iron supplementation  Thrombocytopenia -Questionable cause.  No signs of bleeding.  Monitor  intermittently.  Rash -Diffuse rash throughout the body with history of?  Psoriasis.  Continue topical steroid cream.  Might need outpatient dermatology follow-up.  Constipation -Continue bowel regimen  Sacral lesion -Continue wound care and turning often  Stage II left and right sacral pressure injury: Present on admission -Continue local wound care  History of DVT (deep vein thrombosis) -Continue Eliquis  Tobacco abuse -Continue nicotine patch  Hyperlipidemia -Continue statin  GERD without esophagitis -Continue PPI  Essential hypertension - continue coreg  Acute cystitis without hematuria - completed antibiotics   DVT prophylaxis: Eliquis Code Status: Full Family Communication: None at bedside Disposition Plan: Status is: Inpatient  Remains inpatient appropriate because:Inpatient level of care appropriate due to severity of illness   Dispo: The patient is from: Home              Anticipated d/c is to: SNF              Anticipated d/c date is: 1 day              Patient currently is medically stable to d/c.  Discharge once radiation is completed   Consultants: Oncology/radiation oncology.  Palliative care evaluation is pending  Procedures: Sacral CT biopsy  Antimicrobials: None   Subjective: Patient seen and examined at bedside.  Poor historian.  Denies worsening shortness of breath, vomiting, fever.   Objective: Vitals:   04/11/20 2131 04/12/20 0608 04/12/20 2118 04/13/20 0535  BP: 119/74 117/71 122/67 (!) 125/59  Pulse: 66 66 69 72  Resp: 16 16 14 16   Temp: 97.6 F (36.4 C) 97.8 F (36.6 C) 98.9 F (37.2 C) 98.1 F (36.7 C)  TempSrc: Oral Oral Oral Oral  SpO2: 90% 97% (!) 89% 91%  Weight:      Height:  Intake/Output Summary (Last 24 hours) at 04/13/2020 0812 Last data filed at 04/12/2020 1130 Gross per 24 hour  Intake 240 ml  Output --  Net 240 ml   Filed Weights   03/24/20 2140 04/01/20 0448  Weight: 111.9 kg 90 kg     Examination:  General exam: No acute distress.  Poor historian.  Chronically ill looking female lying in bed. Respiratory system: Reversals at bases bilaterally with scattered crackles, no wheezing cardiovascular system: Rate controlled, S1-S2 heard Gastrointestinal system: Abdomen is nondistended, soft and nontender.  Bowel sounds are heard  extremities: Mild lower extremity edema present.  No clubbing Skin: Generalized rash over various areas of the body, maculopapular with some ulcerations and scabbing with chronic skin lesions on the abdomen   Data Reviewed: I have personally reviewed following labs and imaging studies  CBC: Recent Labs  Lab 04/13/20 0635  WBC 3.6*  NEUTROABS 1.9  HGB 13.1  HCT 42.5  MCV 97.5  PLT 229*   Basic Metabolic Panel: Recent Labs  Lab 04/13/20 0635  NA 139  K 4.0  CL 103  CO2 27  GLUCOSE 105*  BUN 26*  CREATININE 0.92  CALCIUM 9.4  MG 2.4   GFR: Estimated Creatinine Clearance: 60.2 mL/min (by C-G formula based on SCr of 0.92 mg/dL). Liver Function Tests: Recent Labs  Lab 04/13/20 0635  AST 42*  ALT 20  ALKPHOS 121  BILITOT 1.4*  PROT 6.8  ALBUMIN 2.9*   No results for input(s): LIPASE, AMYLASE in the last 168 hours. No results for input(s): AMMONIA in the last 168 hours. Coagulation Profile: No results for input(s): INR, PROTIME in the last 168 hours. Cardiac Enzymes: No results for input(s): CKTOTAL, CKMB, CKMBINDEX, TROPONINI in the last 168 hours. BNP (last 3 results) No results for input(s): PROBNP in the last 8760 hours. HbA1C: No results for input(s): HGBA1C in the last 72 hours. CBG: Recent Labs  Lab 04/12/20 0733 04/12/20 1212 04/12/20 1727 04/12/20 2121 04/13/20 0730  GLUCAP 90 113* 139* 86 106*   Lipid Profile: No results for input(s): CHOL, HDL, LDLCALC, TRIG, CHOLHDL, LDLDIRECT in the last 72 hours. Thyroid Function Tests: No results for input(s): TSH, T4TOTAL, FREET4, T3FREE, THYROIDAB in the last  72 hours. Anemia Panel: No results for input(s): VITAMINB12, FOLATE, FERRITIN, TIBC, IRON, RETICCTPCT in the last 72 hours. Sepsis Labs: No results for input(s): PROCALCITON, LATICACIDVEN in the last 168 hours.  No results found for this or any previous visit (from the past 240 hour(s)).       Radiology Studies: No results found.      Scheduled Meds: . apixaban  5 mg Oral BID  . carvedilol  3.125 mg Oral BID WC  . citalopram  30 mg Oral QHS  . clobetasol ointment   Topical BID  . feeding supplement  237 mL Oral BID BM  . ferrous sulfate  325 mg Oral Q breakfast  . gabapentin  600 mg Oral TID  . insulin aspart  0-15 Units Subcutaneous TID AC & HS  . insulin glargine  40 Units Subcutaneous Q2200  . multivitamin with minerals  1 tablet Oral Daily  . nystatin   Topical BID  . oxyCODONE  10 mg Oral Q12H  . pantoprazole  40 mg Oral Daily  . polyethylene glycol  17 g Oral BID  . Zinc Oxide   Topical BID   Continuous Infusions:        Aline August, MD [facility] 04/13/2020, 8:12 AM

## 2020-04-13 NOTE — Progress Notes (Signed)
Occupational Therapy Treatment Patient Details Name: Tonya Dougherty MRN: [id number] DOB: [birth year] Today's Date: 04/13/2020    History of present illness Pt is a 69 y.o. female with a medical history significant for adenocarcinoma of sigmoid colon s/p resection 0/3212, nonalcoholic fatty liver disease, DM2, HTN, DVT (Eliquis), admitted 03/24/20 with progressive LLE pain radiating from L buttock to foot over the last 3 months that has gotten worse acutely. Imaging showed destructive L sacral lesion found to be metastatic adenocarcinoma.  Pt started on radiation treatments.   OT comments  Treatment focused on improving patient's functional mobility in order to advance self care skills. Patient able to perform sit to stand from recliner with RW with min assist x 3. Mild posterior loss of balance but no buckling of knees. Patient able to take steps to sink to stand at sink and wash hands. Able to stabilize in standing by propping elbows on counter. Patient limited by persistent itching from profuse rash during treatment. Patient stating she wants to go home at discharge - that her grandson will predominantly be there with her. At this time therapist still recommends short term rehab. If patient improves enough to safely return while hospitalized will update POC.   Follow Up Recommendations  SNF    Equipment Recommendations  3 in 1 bedside commode    Recommendations for Other Services      Precautions / Restrictions Precautions Precautions: Fall Precaution Comments: has fallen twice with nursing, may be incontinent Restrictions Weight Bearing Restrictions: No       Mobility Bed Mobility               General bed mobility comments: Patient seated in recilner when therapist entered the room.  Transfers Overall transfer level: Needs assistance Equipment used: Rolling walker (2 wheeled) Transfers: Stand Pivot Transfers;Sit to/from Stand Sit to Stand: Min guard;Min assist Stand  pivot transfers: Min assist       General transfer comment: Min assist to perform sit to stand x 3 from recliner height. Verbal cues for hand placement. Min guard to take steps to sink and stand at sink for grooming.    Balance Overall balance assessment: Needs assistance Sitting-balance support: No upper extremity supported;Feet supported Sitting balance-Leahy Scale: Fair     Standing balance support: Bilateral upper extremity supported Standing balance-Leahy Scale: Poor Standing balance comment: reliant on UE support                           ADL either performed or assessed with clinical judgement   ADL Overall ADL's : Needs assistance/impaired   Eating/Feeding Details (indicate cue type and reason): Patient found sitting in chair. Reports just finishing meal. Grooming: Wash/dry hands;Standing;Min guard Grooming Details (indicate cue type and reason): Min guard to stand at sink with elbows propped on counter - to wash hands. Chair behind her for safety.                                     Vision Patient Visual Report: No change from baseline Vision Assessment?: No apparent visual deficits   Perception     Praxis      Cognition Arousal/Alertness: Awake/alert Behavior During Therapy: WFL for tasks assessed/performed Overall Cognitive Status: Within Functional Limits for tasks assessed  Exercises     Shoulder Instructions       General Comments      Pertinent Vitals/ Pain       Pain Assessment: No/denies pain  Home Living                                          Prior Functioning/Environment              Frequency  Min 2X/week        Progress Toward Goals  OT Goals(current goals can now be found in the care plan section)  Progress towards OT goals: Progressing toward goals  Acute Rehab OT Goals Patient Stated Goal: to go home OT Goal  Formulation: With patient Time For Goal Achievement: 04/21/20 Potential to Achieve Goals: Good  Plan Discharge plan remains appropriate    Co-evaluation                 AM-PAC OT "6 Clicks" Daily Activity     Outcome Measure   Help from another person eating meals?: A Little Help from another person taking care of personal grooming?: A Little Help from another person toileting, which includes using toliet, bedpan, or urinal?: A Lot Help from another person bathing (including washing, rinsing, drying)?: A Lot Help from another person to put on and taking off regular upper body clothing?: A Little Help from another person to put on and taking off regular lower body clothing?: A Lot 6 Click Score: 15    End of Session Equipment Utilized During Treatment: Gait belt;Rolling walker  OT Visit Diagnosis: Unsteadiness on feet (R26.81);Repeated falls (R29.6);Muscle weakness (generalized) (M62.81)   Activity Tolerance Patient tolerated treatment well   Patient Left in chair;with call bell/phone within reach;with chair alarm set   Nurse Communication Mobility status        Time: 5364-6803 OT Time Calculation (min): 14 min  Charges: OT General Charges $OT Visit: 1 Visit OT Treatments $Therapeutic Activity: 8-22 mins  Derl Barrow, OTR/L [place]  Office [phone] Pager: [place] 04/13/2020, 3:26 PM

## 2020-04-14 ENCOUNTER — Ambulatory Visit: Payer: Medicare HMO | Admitting: Oncology

## 2020-04-14 ENCOUNTER — Encounter: Payer: Self-pay | Admitting: Radiation Oncology

## 2020-04-14 ENCOUNTER — Ambulatory Visit
Admit: 2020-04-14 | Discharge: 2020-04-14 | Disposition: A | Discharge status: Home / Self Care | Payer: Medicare HMO | Attending: Radiation Oncology | Admitting: Radiation Oncology

## 2020-04-14 ENCOUNTER — Inpatient Hospital Stay: Payer: Medicare HMO | Admitting: Nurse Practitioner

## 2020-04-14 DIAGNOSIS — Z7189 Other specified counseling: Secondary | ICD-10-CM

## 2020-04-14 DIAGNOSIS — Z515 Encounter for palliative care: Secondary | ICD-10-CM

## 2020-04-14 LAB — GLUCOSE, CAPILLARY
Glucose-Capillary: 120 mg/dL — ABNORMAL HIGH (ref 70–99)
Glucose-Capillary: 182 mg/dL — ABNORMAL HIGH (ref 70–99)
Glucose-Capillary: 109 mg/dL — ABNORMAL HIGH (ref 70–99)

## 2020-04-14 MED ORDER — SENNOSIDES-DOCUSATE SODIUM 8.6-50 MG PO TABS: 1.0000 | ORAL_TABLET | Freq: Two times a day (BID) | ORAL | 0 refills | Status: DC | PRN

## 2020-04-14 MED ORDER — CLOBETASOL PROPIONATE 0.05 % EX OINT: TOPICAL_OINTMENT | Freq: Two times a day (BID) | CUTANEOUS | 0 refills | Status: DC

## 2020-04-14 MED ORDER — DIPHENHYDRAMINE HCL 25 MG PO CAPS: 25.0000 mg | ORAL_CAPSULE | Freq: Four times a day (QID) | ORAL | Status: DC | PRN

## 2020-04-14 MED ORDER — FUROSEMIDE 20 MG PO TABS: 20.0000 mg | ORAL_TABLET | Freq: Every day | ORAL | Status: DC

## 2020-04-14 MED ORDER — ZINC OXIDE 12.8 % EX OINT: TOPICAL_OINTMENT | Freq: Two times a day (BID) | CUTANEOUS | 0 refills | Status: DC

## 2020-04-14 MED ORDER — GABAPENTIN 300 MG PO CAPS: 600.0000 mg | ORAL_CAPSULE | Freq: Three times a day (TID) | ORAL | 0 refills | Status: DC

## 2020-04-14 MED ORDER — NYSTATIN 100000 UNIT/GM EX POWD: Freq: Two times a day (BID) | CUTANEOUS | 0 refills | Status: DC

## 2020-04-14 MED ORDER — POLYETHYLENE GLYCOL 3350 17 GM/SCOOP PO POWD: 17.0000 g | Freq: Two times a day (BID) | ORAL | 0 refills | Status: DC

## 2020-04-14 NOTE — Discharge Summary (Signed)
Physician Discharge Summary  Tonya Dougherty:[id number] DOB: 1950/07/04 DOA: 03/24/2020  PCP: Gay Filler, MD  Admit date: 03/24/2020 Discharge date: 04/14/2020  Admitted From: Home Disposition: SNF  Recommendations for Outpatient Follow-up:  1. Follow up with SNF provider in 1 week with repeat CBC/BMP 2. Outpatient evaluation and follow-up by palliative care for goals of care discussion 3. Outpatient follow-up with oncology 4. Patient might need outpatient evaluation and follow-up by dermatology if rash does not improve 5. 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 CT chest, abdomen pelvis showed destructive left sacral lesion with associated 2.8 x 3.5 cm soft tissue component anterior to the left sacrum worrisome for metastasis. 2.6 cm hypoenhancing mass in the right upper kidney.  CT-guided biopsies on 03/28/20 of sacral lesion showing metastatic adenocarcinoma consistent with gastrointestinal primary.  Oncology was consulted. She was transferred to [facility] long for initiation of XRT.    Subsequently, she has completed radiation treatment.  PT recommended SNF placement.  She will be discharged to SNF once bed is available.  She will need palliative care evaluation at SNF for goals of care discussion.  Discharge Diagnoses:   Ambulatory dysfunction due to metastases  to the sacrum with invasion of S1 and S2 neural foramina from metastatic colon adenocarcinoma -Patient has history of robot-assisted sigmoidectomy on 07/2019 -Oncology and radiation oncology following.  Patient is currently undergoing palliative XRT till Thursday. -Continue pain management and bowel regimen. -PT recommending SNF placement. discharge to SNF after completion of radiation therapy on Thursday.   -Palliative care consultation for goals of care discussion is pending.  This can happen at SNF.  Diabetes mellitus type II with hyperglycemia -Continue Lantus.    Carb modified diet.  Iron deficiency anemia -Hemoglobin stable.  Continue oral iron supplementation.  Outpatient follow-up.  Thrombocytopenia -Questionable cause.  No signs of bleeding.  Monitor intermittently as an outpatient.  Rash -Diffuse rash throughout the body with history of?  Psoriasis.  Continue topical steroid cream.  Improving. Might need outpatient dermatology follow-up.  Constipation -Continue bowel regimen  Sacral lesion -Continue wound care and turning often  Stage II left and right sacral pressure injury: Present on admission -Continue local wound care as per wound care recommendations.  History of DVT (deep vein thrombosis) -Continue Eliquis  Tobacco abuse -Patient was counseled regarding tobacco cessation.  Hyperlipidemia -Continue statin  GERD without esophagitis -Continue PPI  Essential hypertension - continue coreg and decrease Lasix to once a day.  Spironolactone has been discontinued.  Acute cystitis without hematuria - completed antibiotics   Discharge Instructions  Discharge Instructions    Amb Referral to Palliative Care   Complete by: As directed    Goals of care   Ambulatory referral to Hematology / Oncology   Complete by: As directed    Diet - low sodium heart healthy   Complete by: As directed    Diet Carb Modified   Complete by: As directed    Discharge  wound  care:   Complete by: As directed    Wound care  Daily         Comments: Wash abdominal fold, pat dry. Insert InterDry into fold following these instructions:  Measure and cut length of InterDry to fit in skin folds that have skin breakdown   Tuck InterDry fabric into skin folds in a single layer, allow for 2 inches of overhang from skin edges to allow for wicking to occur  May remove to bathe; dry area thoroughly and then tuck into affected areas again   Do not apply any creams or ointments when using InterDry  DO NOT THROW AWAY FOR 5 DAYS unless soiled with stool  DO NOT [facility] product, this will inactivate the silver in the material   New sheet of Interdry should be applied after 5 days of use if patient continues to have skin breakdown       Wound care  Every shift         Comments: Patient has Moisture Associated Skin Damage - Incontinence Associated Dermatitis to the buttocks and gluteal fold. Cut a narrow strip of Aquacel Kellie Simmering # [id number]) and place into gluteal fold. Then cover with a foam dressing. Check the dressings often to determine if they have been saturated by urine, and if they have, replace.   Increase activity slowly   Complete by: As directed      Allergies as of 04/14/2020      Reactions   Xarelto [rivaroxaban] Rash   Severe rash with itching   Erythromycin Other (See Comments)   Oral Thrush   Lipitor [atorvastatin] Other (See Comments)   Leg cramps   Pravachol [pravastatin] Other (See Comments)   Leg muscle cramps   Ivp Dye [iodinated Diagnostic Agents] Rash      Medication List    STOP taking these medications   lidocaine 5 % Commonly known as: LIDODERM   Magnesium 100 MG Tabs   Melatonin 5 MG Caps   promethazine 25 MG tablet Commonly known as: PHENERGAN   spironolactone 50 MG tablet Commonly known as: ALDACTONE   tiZANidine 4 MG tablet Commonly known as: Zanaflex     TAKE these medications   acetaminophen 500 MG tablet Commonly  known as: TYLENOL Take 1,000 mg by mouth every 6 (six) hours as needed for mild pain.   albuterol 108 (90 Base) MCG/ACT inhaler Commonly known as: VENTOLIN HFA Inhale 2 puffs into the lungs every 6 (six) hours as needed for wheezing or shortness of breath.   B-D ULTRAFINE III SHORT PEN 31G X 8 MM Misc Generic drug: Insulin Pen Needle 1 each by Other route daily. For lantus   Basaglar KwikPen 100 UNIT/ML Inject 40 Units into the skin at bedtime.   Calcium 600+D 600-800 MG-UNIT Tabs Generic drug: Calcium Carb-Cholecalciferol Take 2 tablets by mouth at bedtime.   carboxymethylcellulose 0.5 % Soln Commonly known as: REFRESH PLUS Place 1 drop into both eyes 3 (three) times daily as needed (dry/irritated eyes).   carvedilol 3.125 MG tablet Commonly known as: COREG Take 3.125 mg by mouth 2 (two) times daily with a meal.   citalopram 10 MG tablet Commonly known as: CELEXA Take 30 mg by mouth at bedtime.   clobetasol ointment 0.05 % Commonly known as: TEMOVATE Apply topically 2 (two) times daily. Apply to affected rash   diclofenac Sodium 1 % Gel Commonly known as: VOLTAREN Apply 2 g topically as needed for pain.   diphenhydrAMINE 25 mg capsule Commonly known as:  BENADRYL Take 1 capsule (25 mg total) by mouth every 6 (six) hours as needed for itching.   Eliquis 5 MG Tabs tablet Generic drug: apixaban TAKE 1 TABLET BY MOUTH TWICE A DAY What changed: how much to take   ferrous sulfate 325 (65 FE) MG tablet Take 325 mg by mouth daily with breakfast.   fluticasone 50 MCG/ACT nasal spray Commonly known as: FLONASE Place 1-2 sprays into both nostrils daily as needed (allergies.).   furosemide 20 MG tablet Commonly known as: LASIX Take 1 tablet (20 mg total) by mouth daily. What changed: when to take this   gabapentin 300 MG capsule Commonly known as: NEURONTIN Take 2 capsules (600 mg total) by mouth 3 (three) times daily. What changed: how much to take   nystatin  powder Commonly known as: MYCOSTATIN/NYSTOP Apply topically 2 (two) times daily. Apply to folds of abdomen, groin and breasts   omeprazole 20 MG capsule Commonly known as: PRILOSEC Take 20 mg by mouth at bedtime.   polyethylene glycol powder 17 GM/SCOOP powder Commonly known as: GLYCOLAX/MIRALAX Take 17 g by mouth in the morning and at bedtime. What changed:   when to take this  reasons to take this   senna-docusate 8.6-50 MG tablet Commonly known as: Senokot-S Take 1 tablet by mouth 2 (two) times daily as needed for moderate constipation.   Zinc Oxide 12.8 % ointment Commonly known as: TRIPLE PASTE Apply topically 2 (two) times daily. Apply to skin breakdown around buttocks            Discharge Care Instructions  (From admission, onward)         Start     Ordered   04/14/20 0000  Discharge wound care:       Comments: Wound care  Daily         Comments: Wash abdominal fold, pat dry. Insert InterDry into fold following these instructions:  Measure and cut length of InterDry to fit in skin folds that have skin breakdown   Tuck InterDry fabric into skin folds in a single layer, allow for 2 inches of overhang from skin edges to allow for wicking to occur  May remove to bathe; dry area thoroughly and then tuck into affected areas again   Do not apply any creams or ointments when using InterDry  DO NOT THROW AWAY FOR 5 DAYS unless soiled with stool  DO NOT [facility] product, this will inactivate the silver in the material   New sheet of Interdry should be applied after 5 days of use if patient continues to have skin breakdown       Wound care  Every shift         Comments: Patient has Moisture Associated Skin Damage - Incontinence Associated Dermatitis to the buttocks and gluteal fold. Cut a narrow strip of Aquacel Kellie Simmering # [id number]) and place into gluteal fold. Then cover with a foam dressing. Check the dressings often to determine if they have been saturated by urine,  and if they have, replace.   04/14/20 0907          Contact information for after-discharge care    Destination    [facility] .   Service: Skilled Nursing Contact information: [place] [phone]                 Allergies  Allergen Reactions  . Xarelto [Rivaroxaban] Rash    Severe rash with itching  . Erythromycin Other (See Comments)  Oral Thrush  . Lipitor [Atorvastatin] Other (See Comments)    Leg cramps  . Pravachol [Pravastatin] Other (See Comments)    Leg muscle cramps  . Ivp Dye [Iodinated Diagnostic Agents] Rash    Consultations:  Oncology/radiation oncology.  Palliative care evaluation is pending   Procedures/Studies: MR THORACIC SPINE WO CONTRAST  Result Date: 03/25/2020 CLINICAL DATA:  Initial evaluation for low back pain, cauda equina syndrome suspected. EXAM: MRI THORACIC AND LUMBAR SPINE WITHOUT CONTRAST TECHNIQUE: Multiplanar and multiecho pulse sequences of the thoracic and lumbar spine were obtained without intravenous contrast. COMPARISON:  Prior CT from 06/05/2019 FINDINGS: MRI THORACIC SPINE FINDINGS Alignment:  Examination degraded by motion artifact. Vertebral bodies normally aligned with preservation of the normal thoracic kyphosis. No listhesis or static subluxation. Vertebrae: Vertebral body height maintained without acute or chronic fracture. Bone marrow signal intensity mildly heterogeneous without worrisome osseous lesion. No evidence for metastatic disease within the thoracic spine. Subcentimeter benign hemangioma noted within the T10 vertebral body. Cord:  Normal signal and morphology on this motion degraded exam. Paraspinal and other soft tissues: Paraspinous soft tissues demonstrate no acute finding. Probable scattered atelectatic changes noted within the partially visualized lungs. Visualized visceral structures otherwise grossly unremarkable. Disc levels: Normal expected for age  multilevel disc desiccation with mild annular disc bulging, most notable at T4-5, T10-11, and T11-12. No significant spinal stenosis or evidence for cord deformity or impingement. Foramina remain patent. No neural impingement. MRI LUMBAR SPINE FINDINGS Segmentation:  Examination moderately degraded by motion artifact. Standard segmentation. Lowest well-formed disc space labeled the L5-S1 level. Alignment: Levoscoliosis. Trace 2 mm retrolisthesis of L3 on L4, with trace 2 mm anterolisthesis of L4 on L5, chronic and facet mediated. Alignment otherwise normal with preservation of the normal lumbar lordosis. Vertebrae: There is an abnormal T2/STIR hyperintense lesion involving the central and left aspect of the sacrum, concerning for osseous metastatic disease (series 2, image 9). This appears to be new as compared to most recent available CT from 06/05/2019. Probable early invasion of the adjacent left S1 and possibly S2 neural foramina, partially visualized (series 5, image 26). Possible early invasion of the right S1 neural foramen noted as well. Probable extraosseous extension into the adjacent presacral space noted on sagittal view (series 2, image 13). No visible pathologic fracture. No other worrisome osseous lesions or evidence for metastatic disease seen within the lumbar spine. Underlying bone marrow signal intensity somewhat diffusely heterogeneous. Vertebral body height maintained without acute or chronic fracture. No other abnormal marrow edema. Conus medullaris and cauda equina: Conus extends to the L1-2 level. Conus and cauda equina appear normal. Paraspinal and other soft tissues: Paraspinous soft tissues demonstrate no acute finding on this motion degraded exam. There is question of a heterogeneous lesion within the interpolar right kidney measuring 2.0 x 1.8 cm (series 4, image 5), not well evaluated on this motion degraded and noncontrast examination. No lesion seen at this location on prior CT.  Remainder of the visualized visceral structures otherwise unremarkable. Disc levels: L1-2: Diffuse disc bulge with disc desiccation and intervertebral disc space narrowing. Superimposed central to left subarticular disc protrusion indents the ventral thecal sac (series 4, image 7). Resultant mild to moderate spinal stenosis without frank cord impingement. Foramina remain patent. L2-3: Mild diffuse disc bulge with disc desiccation. Disc bulge eccentric to the right. Mild bilateral facet hypertrophy. Resultant mild canal with right subarticular stenosis. Foramina remain patent. L3-4: Mild diffuse disc bulge with disc desiccation. Disc bulge eccentric to the left. Moderate  bilateral facet hypertrophy. Resultant moderate canal with left lateral recess stenosis. Mild left L3 foraminal narrowing. Right neural foramina remains patent. L4-5: Trace anterolisthesis. Diffuse disc bulge with disc desiccation. Disc bulge asymmetric to the left. Superimposed small central to left foraminal disc extrusion with superior migration (series 18, image 8). Moderate bilateral facet and ligament flavum hypertrophy. Associated small joint effusion on the left. Resultant severe canal with bilateral subarticular stenosis. Thecal sac measures 5 mm in AP diameter at its most narrow point. Moderate left L4 foraminal stenosis. No significant right foraminal narrowing. L5-S1: Degenerative intervertebral disc space narrowing with diffuse disc bulge and disc desiccation. Reactive endplate change with marginal endplate osteophytic spurring. Broad posterior disc osteophyte contacts the descending S1 nerve roots as they course through the lateral recesses bilaterally (series 4, image 23). Mild facet hypertrophy. Resultant mild to moderate bilateral lateral recess narrowing. Central canal remains patent. Mild right worse than left L5 foraminal stenosis. IMPRESSION: MR THORACIC SPINE IMPRESSION: 1. Motion degraded exam. 2. No acute abnormality within  the thoracic spine. No evidence for significant disc pathology, stenosis, or evidence for neural impingement. No metastatic disease. MR LUMBAR SPINE IMPRESSION: 1. Abnormal T2/STIR hyperintense lesion involving the central and left aspect of the sacrum, concerning for osseous metastatic disease. Probable early invasion of the adjacent left S1 and S2 neural foramina, with extraosseous extension into the adjacent presacral space. No visible pathologic fracture. Finding could be further assessed with dedicated MRI of the sacrum/pelvis as clinically warranted. 2. No other metastatic disease within the lumbar spine. 3. 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 4. Left eccentric disc bulge and facet hypertrophy at L3-4 with resultant mild to moderate canal and left lateral recess stenosis. 5. Central to left subarticular disc protrusion at L1-2 with resultant mild to moderate spinal stenosis. Electronically Signed   By: Jeannine Boga M.D.   On: 03/25/2020 01:13   MR LUMBAR SPINE WO CONTRAST  Result Date: 03/25/2020 CLINICAL DATA:  Initial evaluation for low back pain, cauda equina syndrome suspected. EXAM: MRI THORACIC AND LUMBAR SPINE WITHOUT CONTRAST TECHNIQUE: Multiplanar and multiecho pulse sequences of the thoracic and lumbar spine were obtained without intravenous contrast. COMPARISON:  Prior CT from 06/05/2019 FINDINGS: MRI THORACIC SPINE FINDINGS Alignment:  Examination degraded by motion artifact. Vertebral bodies normally aligned with preservation of the normal thoracic kyphosis. No listhesis or static subluxation. Vertebrae: Vertebral body height maintained without acute or chronic fracture. Bone marrow signal intensity mildly heterogeneous without worrisome osseous lesion. No evidence for metastatic disease within the thoracic spine. Subcentimeter benign hemangioma noted within the T10  vertebral body. Cord:  Normal signal and morphology on this motion degraded exam. Paraspinal and other soft tissues: Paraspinous soft tissues demonstrate no acute finding. Probable scattered atelectatic changes noted within the partially visualized lungs. Visualized visceral structures otherwise grossly unremarkable. Disc levels: Normal expected for age multilevel disc desiccation with mild annular disc bulging, most notable at T4-5, T10-11, and T11-12. No significant spinal stenosis or evidence for cord deformity or impingement. Foramina remain patent. No neural impingement. MRI LUMBAR SPINE FINDINGS Segmentation:  Examination moderately degraded by motion artifact. Standard segmentation. Lowest well-formed disc space labeled the L5-S1 level. Alignment: Levoscoliosis. Trace 2 mm retrolisthesis of L3 on L4, with trace 2 mm anterolisthesis of L4 on L5, chronic and facet mediated. Alignment otherwise normal with preservation of the normal lumbar lordosis. Vertebrae: There is an abnormal T2/STIR hyperintense lesion involving the central  and left aspect of the sacrum, concerning for osseous metastatic disease (series 2, image 9). This appears to be new as compared to most recent available CT from 06/05/2019. Probable early invasion of the adjacent left S1 and possibly S2 neural foramina, partially visualized (series 5, image 26). Possible early invasion of the right S1 neural foramen noted as well. Probable extraosseous extension into the adjacent presacral space noted on sagittal view (series 2, image 13). No visible pathologic fracture. No other worrisome osseous lesions or evidence for metastatic disease seen within the lumbar spine. Underlying bone marrow signal intensity somewhat diffusely heterogeneous. Vertebral body height maintained without acute or chronic fracture. No other abnormal marrow edema. Conus medullaris and cauda equina: Conus extends to the L1-2 level. Conus and cauda equina appear normal.  Paraspinal and other soft tissues: Paraspinous soft tissues demonstrate no acute finding on this motion degraded exam. There is question of a heterogeneous lesion within the interpolar right kidney measuring 2.0 x 1.8 cm (series 4, image 5), not well evaluated on this motion degraded and noncontrast examination. No lesion seen at this location on prior CT. Remainder of the visualized visceral structures otherwise unremarkable. Disc levels: L1-2: Diffuse disc bulge with disc desiccation and intervertebral disc space narrowing. Superimposed central to left subarticular disc protrusion indents the ventral thecal sac (series 4, image 7). Resultant mild to moderate spinal stenosis without frank cord impingement. Foramina remain patent. L2-3: Mild diffuse disc bulge with disc desiccation. Disc bulge eccentric to the right. Mild bilateral facet hypertrophy. Resultant mild canal with right subarticular stenosis. Foramina remain patent. L3-4: Mild diffuse disc bulge with disc desiccation. Disc bulge eccentric to the left. Moderate bilateral facet hypertrophy. Resultant moderate canal with left lateral recess stenosis. Mild left L3 foraminal narrowing. Right neural foramina remains patent. L4-5: Trace anterolisthesis. Diffuse disc bulge with disc desiccation. Disc bulge asymmetric to the left. Superimposed small central to left foraminal disc extrusion with superior migration (series 18, image 8). Moderate bilateral facet and ligament flavum hypertrophy. Associated small joint effusion on the left. Resultant severe canal with bilateral subarticular stenosis. Thecal sac measures 5 mm in AP diameter at its most narrow point. Moderate left L4 foraminal stenosis. No significant right foraminal narrowing. L5-S1: Degenerative intervertebral disc space narrowing with diffuse disc bulge and disc desiccation. Reactive endplate change with marginal endplate osteophytic spurring. Broad posterior disc osteophyte contacts the descending  S1 nerve roots as they course through the lateral recesses bilaterally (series 4, image 23). Mild facet hypertrophy. Resultant mild to moderate bilateral lateral recess narrowing. Central canal remains patent. Mild right worse than left L5 foraminal stenosis. IMPRESSION: MR THORACIC SPINE IMPRESSION: 1. Motion degraded exam. 2. No acute abnormality within the thoracic spine. No evidence for significant disc pathology, stenosis, or evidence for neural impingement. No metastatic disease. MR LUMBAR SPINE IMPRESSION: 1. Abnormal T2/STIR hyperintense lesion involving the central and left aspect of the sacrum, concerning for osseous metastatic disease. Probable early invasion of the adjacent left S1 and S2 neural foramina, with extraosseous extension into the adjacent presacral space. No visible pathologic fracture. Finding could be further assessed with dedicated MRI of the sacrum/pelvis as clinically warranted. 2. No other metastatic disease within the lumbar spine. 3. 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 4. Left eccentric disc bulge and facet hypertrophy at L3-4 with resultant mild to moderate canal and left lateral recess stenosis. 5. Central to left subarticular disc protrusion at L1-2  with resultant mild to moderate spinal stenosis. Electronically Signed   By: Jeannine Boga M.D.   On: 03/25/2020 01:13   CT CHEST ABDOMEN PELVIS W CONTRAST  Result Date: 03/25/2020 CLINICAL DATA:  Left sacral lesion on MR. History of sigmoid colon cancer. EXAM: CT CHEST, ABDOMEN, AND PELVIS WITH CONTRAST TECHNIQUE: Multidetector CT imaging of the chest, abdomen and pelvis was performed following the standard protocol during bolus administration of intravenous contrast. CONTRAST:  134mL OMNIPAQUE IOHEXOL 300 MG/ML  SOLN COMPARISON:  CT chest abdomen pelvis dated 06/05/2019. MRI thoracolumbar spine dated  03/25/2020. FINDINGS: CT CHEST FINDINGS Cardiovascular: Heart is normal in size.  No pericardial effusion. No evidence of thoracic aortic aneurysm. Mediastinum/Nodes: No suspicious mediastinal lymphadenopathy. Visualized thyroid is enlarged/heterogeneous. Lungs/Pleura: Trace right pleural effusion. No suspicious pulmonary nodules. Mild scarring/fibrosis in the right upper lobe. No pleural effusion or pneumothorax. Musculoskeletal: No focal osseous lesions. CT ABDOMEN PELVIS FINDINGS Hepatobiliary: Cirrhosis.  No suspicious/enhancing hepatic lesions. Numerous layering gallstones (series 4/image 64), without associated inflammatory changes. Pancreas: Within normal limits. Spleen: Enlarged, measuring 15.3 cm in maximal craniocaudal dimension. Adrenals/Urinary Tract: Adrenal glands are within normal limits. 2.6 cm hypoenhancing mass in the right upper kidney (series 4/image 65), essentially new. This appearance may reflect infection, but if neoplastic, would favor metastasis over renal cell carcinoma. Left kidney is within normal limits.  No hydronephrosis. Bladder is within normal limits. Stomach/Bowel: Stomach is within normal limits. No evidence of bowel obstruction. Appendix is not discretely visualized. Status post left hemicolectomy with suture line in the lower abdomen (series 4/image 95). Vascular/Lymphatic: No evidence of abdominal aortic aneurysm. Atherosclerotic calcifications of the abdominal aorta and branch vessels. No suspicious abdominopelvic lymphadenopathy. Reproductive: Uterus is within normal limits. Left ovary is within normal limits. Right ovary is notable for a 3.2 cm cyst/follicle, previously 3.5 cm. Other: No abdominopelvic ascites. Musculoskeletal: Destructive left sacral lesion (series 4/image 100), better evaluated on recent MRI. Associated 2.8 x 3.5 cm soft tissue component anterior to the left sacrum (series 4/image 105), previously 2.1 x 2.6 cm. IMPRESSION: Status post left hemicolectomy.  Destructive left sacral lesion, better evaluated on recent MRI. Associated 2.8 x 3.5 cm soft tissue component anterior to the left sacrum, mildly increased. This appearance remains worrisome for metastasis. 2.6 cm hypoenhancing mass in the right upper kidney, essentially new. This appearance may reflect infection, but if neoplastic, would favor metastasis over renal cell carcinoma. Trace right pleural effusion. Cirrhosis. Splenomegaly. No abdominopelvic ascites. Cholelithiasis, without associated inflammatory changes. Electronically Signed   By: Julian Hy M.D.   On: 03/25/2020 13:35   CT BIOPSY  Result Date: 03/28/2020 INDICATION: History of colon cancer, enlarging sacral lesion concerning for metastatic disease EXAM: CT core BIOPSY LEFT SACRAL MASS MEDICATIONS: 1% LIDOCAINE LOCAL ANESTHESIA/SEDATION: Moderate (conscious) sedation was employed during this procedure. A total of Versed 1.5 mg and Fentanyl 50 mcg was administered intravenously. Moderate Sedation Time: 15 minutes. The patient's level of consciousness and vital signs were monitored continuously by radiology nursing throughout the procedure under my direct supervision. FLUOROSCOPY TIME:  Fluoroscopy Time: NONE. COMPLICATIONS: None immediate. PROCEDURE: Informed written consent was obtained from the patient after a thorough discussion of the procedural risks, benefits and alternatives. All questions were addressed. Maximal Sterile Barrier Technique was utilized including caps, mask, sterile gowns, sterile gloves, sterile drape, hand hygiene and skin antiseptic. A timeout was performed prior to the initiation of the procedure. previous imaging reviewed. patient positioned prone. noncontrast localization CT performed. the destructive left sacral soft tissue  mass was localized and marked for a posterior approach. Under sterile conditions and local anesthesia, a 17 gauge coaxial guide was advanced to the left sacral lesion soft tissue component.  Needle position confirmed with CT. Several 18 gauge core biopsies attempted. Sampling was fragmented and placed in formalin. Needle removed. Postprocedure imaging demonstrates no hemorrhage or hematoma. Patient tolerated biopsy well. IMPRESSION: Successful CT-guided left sacral mass 18 gauge core biopsy Electronically Signed   By: Jerilynn Mages.  Shick M.D.   On: 03/28/2020 16:11   DG Knee Complete 4 Views Left  Result Date: 03/24/2020 CLINICAL DATA:  Pain EXAM: LEFT KNEE - COMPLETE 4+ VIEW COMPARISON:  None. FINDINGS: There are moderate tricompartmental degenerative changes, greatest within the medial compartment. There is no significant joint effusion. No acute displaced fracture or dislocation. IMPRESSION: Moderate tricompartmental degenerative changes, greatest within the medial compartment. Electronically Signed   By: Constance Holster M.D.   On: 03/24/2020 23:35   DG Hip Unilat W or Wo Pelvis 2-3 Views Left  Result Date: 03/24/2020 CLINICAL DATA:  Pain status post fall EXAM: DG HIP (WITH OR WITHOUT PELVIS) 2-3V LEFT COMPARISON:  None. FINDINGS: There is mild bilateral hip osteoarthritis. There is no acute displaced fracture or dislocation. There are calcifications projecting over the patient's pelvis that are favored to represent phleboliths. IMPRESSION: Mild bilateral hip osteoarthritis. No acute displaced fracture or dislocation. Electronically Signed   By: Constance Holster M.D.   On: 03/24/2020 23:32       Subjective: Patient seen and examined at bedside.  Denies worsening shortness of breath, nausea, vomiting or fever.  States that pain is controlled.  Poor historian.  Discharge Exam: Vitals:   04/13/20 2044 04/14/20 0531  BP: (!) 141/68 125/71  Pulse: 74 70  Resp: 14 14  Temp: 97.9 F (36.6 C) 98 F (36.7 C)  SpO2: 95% 92%    General: Pt is alert, awake, not in acute distress.  Lying in bed.  Looks chronically ill.  Poor historian. Cardiovascular: rate controlled, S1/S2  + Respiratory: bilateral decreased breath sounds at bases with some scattered crackles Abdominal: Soft, NT, ND, bowel sounds + Extremities: Trace lower extremity edema; no cyanosis Skin: Generalized rash over various areas of the body, maculopapular with some ulcerations and scabbing which are improving with chronic skin lesions on the abdomen     The results of significant diagnostics from this hospitalization (including imaging, microbiology, ancillary and laboratory) are listed below for reference.     Microbiology: Recent Results (from the past 240 hour(s))  SARS Coronavirus 2 by RT PCR (hospital order, performed in [facility] hospital lab) Nasopharyngeal Nasopharyngeal Swab     Status: None   Collection Time: 04/13/20 10:29 AM   Specimen: Nasopharyngeal Swab  Result Value Ref Range Status   SARS Coronavirus 2 NEGATIVE NEGATIVE Final    Comment: (NOTE) SARS-CoV-2 target nucleic acids are NOT DETECTED.  The SARS-CoV-2 RNA is generally detectable in upper and lower respiratory specimens during the acute phase of infection. The lowest concentration of SARS-CoV-2 viral copies this assay can detect is 250 copies / mL. A negative result does not preclude SARS-CoV-2 infection and should not be used as the sole basis for treatment or other patient management decisions.  A negative result may occur with improper specimen collection / handling, submission of specimen other than nasopharyngeal swab, presence of viral mutation(s) within the areas targeted by this assay, and inadequate number of viral copies (<250 copies / mL). A negative result must be combined with  clinical observations, patient history, and epidemiological information.  Fact Sheet for Patients:   StrictlyIdeas.no  Fact Sheet for Healthcare Providers: BankingDealers.co.za  This test is not yet approved or  cleared by the [place] FDA and has been authorized for  detection and/or diagnosis of SARS-CoV-2 by FDA under an Emergency Use Authorization (EUA).  This EUA will remain in effect (meaning this test can be used) for the duration of the COVID-19 declaration under Section 564(b)(1) of the Act, 21 U.S.C. section 360bbb-3(b)(1), unless the authorization is terminated or revoked sooner.  Performed at [facility], [place] [address], [place], [place] [address]      Labs: BNP (last 3 results) Recent Labs    04/30/19 1200  BNP 299.2*   Basic Metabolic Panel: Recent Labs  Lab 04/13/20 0635  NA 139  K 4.0  CL 103  CO2 27  GLUCOSE 105*  BUN 26*  CREATININE 0.92  CALCIUM 9.4  MG 2.4   Liver Function Tests: Recent Labs  Lab 04/13/20 0635  AST 42*  ALT 20  ALKPHOS 121  BILITOT 1.4*  PROT 6.8  ALBUMIN 2.9*   No results for input(s): LIPASE, AMYLASE in the last 168 hours. No results for input(s): AMMONIA in the last 168 hours. CBC: Recent Labs  Lab 04/13/20 0635  WBC 3.6*  NEUTROABS 1.9  HGB 13.1  HCT 42.5  MCV 97.5  PLT 139*   Cardiac Enzymes: No results for input(s): CKTOTAL, CKMB, CKMBINDEX, TROPONINI in the last 168 hours. BNP: Invalid input(s): POCBNP CBG: Recent Labs  Lab 04/13/20 0730 04/13/20 1222 04/13/20 1647 04/13/20 2157 04/14/20 0732  GLUCAP 106* 169* 154* 113* 120*   D-Dimer No results for input(s): DDIMER in the last 72 hours. Hgb A1c No results for input(s): HGBA1C in the last 72 hours. Lipid Profile No results for input(s): CHOL, HDL, LDLCALC, TRIG, CHOLHDL, LDLDIRECT in the last 72 hours. Thyroid function studies No results for input(s): TSH, T4TOTAL, T3FREE, THYROIDAB in the last 72 hours.  Invalid input(s): FREET3 Anemia work up No results for input(s): VITAMINB12, FOLATE, FERRITIN, TIBC, IRON, RETICCTPCT in the last 72 hours. Urinalysis    Component Value Date/Time   COLORURINE YELLOW 03/24/2020 2311   APPEARANCEUR CLOUDY (A) 03/24/2020 2311   LABSPEC 1.018  03/24/2020 2311   PHURINE 5.0 03/24/2020 2311   GLUCOSEU NEGATIVE 03/24/2020 2311   HGBUR MODERATE (A) 03/24/2020 2311   BILIRUBINUR NEGATIVE 03/24/2020 2311   KETONESUR NEGATIVE 03/24/2020 2311   PROTEINUR 30 (A) 03/24/2020 2311   NITRITE POSITIVE (A) 03/24/2020 2311   LEUKOCYTESUR MODERATE (A) 03/24/2020 2311   Sepsis Labs Invalid input(s): PROCALCITONIN,  WBC,  LACTICIDVEN Microbiology Recent Results (from the past 240 hour(s))  SARS Coronavirus 2 by RT PCR (hospital order, performed in [facility] hospital lab) Nasopharyngeal Nasopharyngeal Swab     Status: None   Collection Time: 04/13/20 10:29 AM   Specimen: Nasopharyngeal Swab  Result Value Ref Range Status   SARS Coronavirus 2 NEGATIVE NEGATIVE Final    Comment: (NOTE) SARS-CoV-2 target nucleic acids are NOT DETECTED.  The SARS-CoV-2 RNA is generally detectable in upper and lower respiratory specimens during the acute phase of infection. The lowest concentration of SARS-CoV-2 viral copies this assay can detect is 250 copies / mL. A negative result does not preclude SARS-CoV-2 infection and should not be used as the sole basis for treatment or other patient management decisions.  A negative result may occur with improper specimen collection / handling, submission of specimen  other than nasopharyngeal swab, presence of viral mutation(s) within the areas targeted by this assay, and inadequate number of viral copies (<250 copies / mL). A negative result must be combined with clinical observations, patient history, and epidemiological information.  Fact Sheet for Patients:   StrictlyIdeas.no  Fact Sheet for Healthcare Providers: BankingDealers.co.za  This test is not yet approved or  cleared by the [place] FDA and has been authorized for detection and/or diagnosis of SARS-CoV-2 by FDA under an Emergency Use Authorization (EUA).  This EUA will remain in effect (meaning  this test can be used) for the duration of the COVID-19 declaration under Section 564(b)(1) of the Act, 21 U.S.C. section 360bbb-3(b)(1), unless the authorization is terminated or revoked sooner.  Performed at [facility], [place] [address], [place], [place] [address]      Time coordinating discharge: 35 minutes  SIGNED:   Aline August, MD  [facility] 04/14/2020, 9:09 AM

## 2020-04-14 NOTE — Progress Notes (Signed)
Physical Therapy Treatment Patient Details Name: Tonya Dougherty MRN: [id number] DOB: [birth year] Today's Date: 04/14/2020    History of Present Illness Pt is a 69 y.o. 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 Imaging showed destructive L sacral lesion found to be metastatic adenocarcinoma.  Pt started on radiation treatments.    PT Comments    The patient  Resting in bed, reports going to Rehab today. Patient has BM incontinence. Assisted to [facility] then recliner. Legs remain weak and jerky at times. Possible DC today.   Follow Up Recommendations  SNF;Supervision/Assistance - 24 hour     Equipment Recommendations  None recommended by PT    Recommendations for Other Services       Precautions / Restrictions Precautions Precautions: Fall Precaution Comments: has fallen twice with nursing, may be incontinent, knees jerk and buckle, Has " jerking and uncontrolloed movements    Mobility  Bed Mobility   Bed Mobility: Supine to Sit     Supine to sit: Min guard        Transfers Overall transfer level: Needs assistance Equipment used: Rolling walker (2 wheeled) Transfers: Risk manager;Sit to/from Stand Sit to Stand: Min assist Stand pivot transfers: Min assist       General transfer comment: patient incontinent of B/B, assisted to pivot to [facility], reaching for [facility], steady assistance. Using RW  Stood from [facility] for hygiem=ne, then took  small steps to recliner. Noted jerking of UE's at times.  Ambulation/Gait                 Stairs             Wheelchair Mobility    Modified Rankin (Stroke Patients Only)       Balance Overall balance assessment: Needs assistance Sitting-balance support: No upper extremity  supported;Feet supported Sitting balance-Leahy Scale: Fair     Standing balance support: Bilateral upper extremity supported;During functional activity Standing balance-Leahy Scale: Poor Standing balance comment: reliant on UE support                            Cognition Arousal/Alertness: Awake/alert Behavior During Therapy: WFL for tasks assessed/performed Overall Cognitive Status: Within Functional Limits for tasks assessed                           Safety/Judgement: Decreased awareness of safety            Exercises      General Comments        Pertinent Vitals/Pain Pain Assessment: No/denies pain    Home Living                      Prior Function            PT Goals (current goals can now be found in the care plan section) Progress towards PT goals: Progressing toward goals    Frequency    Min 2X/week      PT Plan Current plan remains appropriate    Co-evaluation              AM-PAC PT "6 Clicks" Mobility   Outcome Measure  Help needed turning from your back to your  side while in a flat bed without using bedrails?: A Little Help needed moving from lying on your back to sitting on the side of a flat bed without using bedrails?: A Little Help needed moving to and from a bed to a chair (including a wheelchair)?: A Lot Help needed standing up from a chair using your arms (e.g., wheelchair or bedside chair)?: A Lot Help needed to walk in hospital room?: Total Help needed climbing 3-5 steps with a railing? : Total 6 Click Score: 12    End of Session Equipment Utilized During Treatment: Gait belt Activity Tolerance: Patient tolerated treatment well Patient left: in chair;with call bell/phone within reach;with chair alarm set Nurse Communication: Mobility status;Need for lift equipment PT Visit Diagnosis: Unsteadiness on feet (R26.81);Muscle weakness (generalized) (M62.81);Other abnormalities of gait and mobility  (R26.89)     Time: 7741-4239 PT Time Calculation (min) (ACUTE ONLY): 35 min  Charges:  $Therapeutic Activity: 8-22 mins $Self Care/Home Management: [place] Pager [phone] Office [phone]    Claretha Cooper 04/14/2020, 1:07 PM

## 2020-04-14 NOTE — Consult Note (Signed)
Palliative care consult note  Reason for consult: Goals of care in light of metastatic colon cancer  Briefly, Tonya Dougherty is a 69 year old female with past medical history of hypertension, nonalcoholic fatty liver disease, dyslipidemia, type 2 diabetes, GERD , and adenocarcinoma of the colon status post resection in 07/2019 who was admitted with ambulatory dysfunction due to pain and found to have sacral invasion.  She has undergone radiation therapy with improvement in her pain.  Currently, biopsy is out for foundation 1 testing and plan is for follow-up with Dr. Benay Spice in a couple of weeks to discuss options for systemic treatment.  Palliative care consulted for goals of care.  I met today with Tonya Dougherty.  I introduced palliative care as specialized medical care for people living with serious illness. It focuses on providing relief from the symptoms and stress of a serious illness. The goal is to improve quality of life for both the patient and the family.  She reports that the thing that is most important to her is her grandchildren.  We discussed plan for her to transition to skilled facility to attempt to regain as much functional status as possible and then follow-up with Dr. Benay Spice in a couple weeks to discuss options for systemic therapy.  We discussed clinical course as well as wishes moving forward in regard to advanced directives.  She has already completed healthcare power of attorney naming her sister as her surrogate.  Concepts specific to code status and rehospitalization discussed.    We reviewed a MOST form and discussed how to develop plan of care to focus on continuing therapies that would maximize chance of being well enough to return home and limiting therapies not in line with this goal.  At this time, she is invested in plan to continue with aggressive interventions, particularly until she is able to follow up with Dr. Benay Spice to discuss systemic treatment options.  -  HCPOA on chart naming her sister as her surrogate. - We completed MOST form today. Attempt resuscitation,  Full Scope of Treatment, IVF and ABX if indicated, feeding tube for a defined trial period. - Plan for f/u with oncology on 12/27. - Would recommend palliative care follow-up as an outpatient to continue conversation for long-term goals of care when she has the ability to discuss further with Dr. Benay Spice once foundation 1 molecular studies have returned.  Start time: 1115 End time: 1215 Total time: 60 minutes  Greater than 50%  of this time was spent counseling and coordinating care related to the above assessment and plan.  Micheline Rough, MD [facility] [phone]

## 2020-04-14 NOTE — TOC Transition Note (Signed)
Transition of Care [facility] - CM/SW Discharge Note   Patient Details  Name: Tonya Dougherty MRN: [id number] Date of Birth: [birth year]  Transition of Care [facility] CM/SW Contact:  Lynnell Catalan, RN Phone Number: 04/14/2020, 2:39 PM   Clinical Narrative:     Pt to dc to [facility] today. [facility] contacted for transport. Pt will go to room 206B, and RN will call report to [phone].  Final next level of care: [place] Barriers to Discharge: Continued Medical Work up   Patient Goals and CMS Choice Patient states their goals for this hospitalization and ongoing recovery are:: Patient plans to discharge to [facility] facility if she is unable to to home. CMS Medicare.gov Compare Post Acute Care list provided to:: Patient Choice offered to / list presented to : Patient  Readmission Risk Interventions Readmission Risk Prevention Plan 03/30/2020  Transportation Screening Complete  PCP or Specialist Appt within 5-7 Days Complete  Home Care Screening Complete  Medication Review (RN CM) Complete  Some recent data might be hidden

## 2020-04-14 NOTE — Care Management Important Message (Signed)
Important Message  Patient Details IM Letter given to the Patient. Name: Tonya Dougherty MRN: [id number] Date of Birth: 01/01/1951   Medicare Important Message Given:  Yes     Kerin Salen 04/14/2020, 10:07 AM

## 2020-04-20 ENCOUNTER — Encounter (HOSPITAL_COMMUNITY): Payer: Self-pay | Admitting: Oncology

## 2020-04-21 ENCOUNTER — Encounter (HOSPITAL_COMMUNITY): Payer: Self-pay | Admitting: Oncology

## 2020-04-25 ENCOUNTER — Inpatient Hospital Stay: Payer: Medicare HMO | Attending: Nurse Practitioner | Admitting: Oncology

## 2020-04-25 NOTE — Progress Notes (Signed)
Spoke with patient's sister regarding appointment that patient had at 3 today with Dr. Truett Perna.  She says unfortunately they were unaware of the appointment.  I have rescheduled her for Monday 05/02/2020 at 11:00 to arrive by 10:45.  She is having a meeting tomorrow at [facility] and she will make sure they are aware of this appointment.  She understands that she may have one person with her.

## 2020-05-02 ENCOUNTER — Inpatient Hospital Stay: Payer: Medicare HMO | Attending: Oncology | Admitting: Oncology

## 2020-05-02 ENCOUNTER — Other Ambulatory Visit: Payer: Self-pay

## 2020-05-02 VITALS — BP 114/60 | HR 66 | Temp 97.9°F | Resp 18 | Ht 62.0 in | Wt 220.0 lb

## 2020-05-02 DIAGNOSIS — G250 Essential tremor: Secondary | ICD-10-CM | POA: Insufficient documentation

## 2020-05-02 DIAGNOSIS — F419 Anxiety disorder, unspecified: Secondary | ICD-10-CM | POA: Insufficient documentation

## 2020-05-02 DIAGNOSIS — E785 Hyperlipidemia, unspecified: Secondary | ICD-10-CM | POA: Diagnosis not present

## 2020-05-02 DIAGNOSIS — I129 Hypertensive chronic kidney disease with stage 1 through stage 4 chronic kidney disease, or unspecified chronic kidney disease: Secondary | ICD-10-CM | POA: Diagnosis not present

## 2020-05-02 DIAGNOSIS — Z86718 Personal history of other venous thrombosis and embolism: Secondary | ICD-10-CM | POA: Diagnosis not present

## 2020-05-02 DIAGNOSIS — C189 Malignant neoplasm of colon, unspecified: Secondary | ICD-10-CM | POA: Diagnosis not present

## 2020-05-02 DIAGNOSIS — E1122 Type 2 diabetes mellitus with diabetic chronic kidney disease: Secondary | ICD-10-CM | POA: Insufficient documentation

## 2020-05-02 DIAGNOSIS — Z79899 Other long term (current) drug therapy: Secondary | ICD-10-CM | POA: Insufficient documentation

## 2020-05-02 DIAGNOSIS — C187 Malignant neoplasm of sigmoid colon: Secondary | ICD-10-CM | POA: Diagnosis not present

## 2020-05-02 DIAGNOSIS — N189 Chronic kidney disease, unspecified: Secondary | ICD-10-CM | POA: Insufficient documentation

## 2020-05-02 DIAGNOSIS — K7460 Unspecified cirrhosis of liver: Secondary | ICD-10-CM | POA: Insufficient documentation

## 2020-05-02 DIAGNOSIS — F32A Depression, unspecified: Secondary | ICD-10-CM | POA: Diagnosis not present

## 2020-05-02 DIAGNOSIS — R161 Splenomegaly, not elsewhere classified: Secondary | ICD-10-CM | POA: Diagnosis not present

## 2020-05-02 NOTE — Progress Notes (Signed)
Met with patient and her sister [name] today at hospital follow up with Dr. Julieanne Manson.  I explained my role as nurse navigator and they were given my direct contact information.  She was shown a model of a port a cath should the need to arise at a later date.  I am also making a referral to [facility] and [place].

## 2020-05-02 NOTE — Progress Notes (Signed)
Tonya Dougherty OFFICE PROGRESS NOTE   Diagnosis: Colon cancer  INTERVAL HISTORY:   [name] was discharged in the hospital on 04/14/2020.  She now resides in a skilled nursing facility.  She completed the course of palliative radiation to the sacrum.  The leg pain has improved.  She continues to take hydrocodone twice daily.  She would like to take a full hydrocodone tablet in the evening.  She is participating in physical therapy and is now ambulating.  She reports that she plans to move to a skilled nursing facility.  She complains of a "rash "at the lower back and buttocks.  [name] is here today with her sister.  [name] reports increased depression symptoms, not controlled with the current medical regimen.  Objective:  Vital signs in last 24 hours:  Blood pressure 114/60, pulse 66, temperature 97.9 F (36.6 C), temperature source Tympanic, resp. rate 18, height _0  (1.575 m), weight 220 lb (99.8 kg), SpO2 97 %.      Resp: Lungs clear bilaterally Cardio: Regular rate and rhythm GI: No hepatomegaly, no mass Vascular: Trace edema at the left lower leg with chronic stasis change Neuro: Alert and oriented, she ambulated to the exam table with assistance Skin: Erythematous raised circular lesions at the upper buttock and lower back bilaterally, yeast rash in the right greater than left groin    Lab Results:  Lab Results  Component Value Date   WBC 3.6 (L) 04/13/2020   HGB 13.1 04/13/2020   HCT 42.5 04/13/2020   MCV 97.5 04/13/2020   PLT 139 (L) 04/13/2020   NEUTROABS 1.9 04/13/2020    CMP  Lab Results  Component Value Date   NA 139 04/13/2020   K 4.0 04/13/2020   CL 103 04/13/2020   CO2 27 04/13/2020   GLUCOSE 105 (H) 04/13/2020   BUN 26 (H) 04/13/2020   CREATININE 0.92 04/13/2020   CALCIUM 9.4 04/13/2020   PROT 6.8 04/13/2020   ALBUMIN 2.9 (L) 04/13/2020   AST 42 (H) 04/13/2020   ALT 20 04/13/2020   ALKPHOS 121 04/13/2020    BILITOT 1.4 (H) 04/13/2020   GFRNONAA >60 04/13/2020   GFRAA >60 08/16/2019    Lab Results  Component Value Date   CEA1 27.7 (H) 04/01/2020    Medications: I have reviewed the patient's current medications.   Assessment/Plan: 1.Metastatic colon cancer,pT3, pN0 adenocarcinoma of the sigmoid colon diagnosed April 2021, status post sigmoidectomy 08/14/2019- MSI Stable  Left sacral mass consistent with colorectalprimary -MRI of the thoracic and lumbar spine 03/24/2020 -abnormal T2/STIR hyperintense lesion involving the central and left aspect of the sacrum concerning for osseous metastatic disease, probable invasion of adjacent left S1-S2 neural foramina, extraosseous extension into the adjacent presacral space -CT chest/abdomen/pelvis 03/25/2020-destructive left sacral lesion, associated 2.8 x 3.5 cm soft tissue component anterior to the left sacrum worrisome for metastasis, 2.6 cm hypoenhancing mass in the right upper kidney-? Metastasis versus renal cell carcinoma -CT-guided biopsy of left sacral mass 03/28/2020-metastatic adenocarcinoma consistent with GI primary, MSS, tumor mutation burden 4, IDH 1, K-ras wild-type -Radiation to left sacral mass 04/01/2020-04/14/2020 2.History of iron deficiency anemia 3. Cirrhosis with splenomegaly 5. Left lower extremity DVT diagnosed 2019 6. Hypertension 7. Diabetes mellitus 8. Benign essential tremor 9. CKD 10. Hyperlipidemia 11. Morbid obesity 12. Anxiety and depression 13. 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  Pain secondary to #1, improved following palliative radiation 16.  Right renal mass on CT 03/25/2020  Disposition: [name] has metastatic colon cancer.  She completed palliative radiation to a left sacral mass.  Her pain has improved.  There is no clinical or radiologic evidence of additional sites of metastatic disease, though she was noted to have a right renal mass on  the CTs in November.  The renal mass could represent a metastasis or a primary tumor.  I discussed the prognosis and treatment options with [name] and her sister.  The plan is to follow her with observation for now.  She will return for an office visit in 4-5 weeks.  We will plan for restaging CTs at a 44-monthinterval.  I reviewed results of Foundation 1 testing with Ms. CToyaand her sister.  She will be a candidate for systemic chemotherapy and biologic therapy at the time of disease progression.  She will follow up with the nursing home MD for management of depression.  She should be referred to dermatology to evaluate the skin rash, likely psoriasis.  GBetsy Coder MD  05/02/2020  11:37 AM

## 2020-05-03 ENCOUNTER — Telehealth: Payer: Self-pay | Admitting: Oncology

## 2020-05-03 NOTE — Telephone Encounter (Signed)
Scheduled appointment per 1/3 los. Spoke to patient who is aware of appointment date and time.  

## 2020-05-11 NOTE — Progress Notes (Signed)
  Radiation Oncology         [phone]) [phone] ________________________________  Name: Tonya Dougherty MRN: [id number]  Date: 04/14/2020  DOB: 02-Sep-1950  End of Treatment Note  Diagnosis:   70 y.o. 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     Indication for treatment:     Palliation  Radiation treatment dates:   04/01/20-04/14/20    Site/dose:   30 Gy in 10 fractions   Beams/energy:   2 static fields and one dynamic conformal arc were used for 3D conformal set up with 6, 10 and 15 MV X-rays  Narrative: The patient tolerated radiation treatment relatively well.     Plan: The patient has completed radiation treatment. The patient will return to radiation oncology clinic for routine followup in one month. I advised her to call or return sooner if she has any questions or concerns related to her recovery or treatment. ________________________________  Sheral Apley. Tammi Klippel, M.D.

## 2020-05-24 ENCOUNTER — Non-Acute Institutional Stay: Payer: Medicare HMO | Admitting: Hospice

## 2020-05-24 ENCOUNTER — Other Ambulatory Visit: Payer: Self-pay

## 2020-05-24 DIAGNOSIS — C187 Malignant neoplasm of sigmoid colon: Secondary | ICD-10-CM

## 2020-05-24 DIAGNOSIS — Z515 Encounter for palliative care: Secondary | ICD-10-CM

## 2020-05-24 DIAGNOSIS — I950 Idiopathic hypotension: Secondary | ICD-10-CM

## 2020-05-24 NOTE — Progress Notes (Addendum)
PATIENT NAME: Tonya Dougherty [address] [place] [address] [phone] (home)  DOB: [birth year] MRN: [id number]  PRIMARY CARE PROVIDER:    Gay Filler, MD,  [address] [place] [address] [phone]  REFERRING PROVIDER:   Dr. Jules Husbands  RESPONSIBLE PARTY:   Extended Emergency Contact Information Primary Emergency Contact: Volney Presser [place] Phone: [phone] Work Phone: [phone] Relation: Friend Secondary Emergency Contact: Constance Haw Home Phone: [phone] Mobile Phone: [phone] Relation: Sister HCPOA  TELEHEALTH VISIT STATEMENT Due to the COVID-19 crisis, this visit was done via telemedicne from my office. It was initiated and consented to by this patient and/or family.   CHIEF COMPLAIN: Initial palliative care visit/hypotension  Visit at the request of  Dr. Jules Husbands  for palliative consult.  Patient in isolation in Covid unit; information from primary nurse. Visit is to build trust and highlight Palliative Medicine as specialized medical care for people living with serious illness, aimed at facilitating better quality of life through symptoms relief, assisting with advance care plan and establishing goals of care.  NP called her sister Tonya Dougherty and left her a voicemail with callback number.  She called back later and was updated on the visit.  Discussion on the difference between hospice and palliative service.  She endorsed palliative service.  Palliative care will continue to provide support to patient, family and the medical team.  RECOMMENDATIONS/PLAN:  Advance Care Planning/CODE STATUS: Patient is a full code.  Signed MOST form in facility chart.  Selections include attempt resuscitation CPR, full scope of treatment, antibiotics if indicated, IV fluids if indicated, feeding tube for defined trial.  GOALS OF CARE: Goals of care include to maximize quality of life and symptom  management. I spent 25  minutes providing this initial consultation. More than 50% of the time in this consultation was spent on counseling patient and coordinating communication -------------------------------------------------------------------------------------------------------- Symptom Management:  Weakness: Continue OT/PT.  Balance of rest and performance activity. Hypotension: BP 80/56 after recheck, asymptomatic, no palpitations no dizziness, no syncope.  Recommendations: Hold 3.125 mg scheduled carvedilol 5 PM today.  Ensure adequate hydration since patient able to eat and drink safely.  Vital signs check 3 times daily x2 days.  Hold carvedilol if systolic less than [id number].  Nursing to notify provider of concerns or unalleviated symptoms. Follow up: Palliative will continue to monitor for symptom management/decline and make recommendations as needed. Return 6 weeks or prn. PPS 50%o Family /Caregiver/Community Supports: Patient in SNF for ongoing care.   HISTORY OF PRESENT ILLNESS:  Tonya Dougherty is a 70 y.o. female with multiple medical problems including hypotension reported by  nursing staff 80/56, asymptomatic, no palpitations no dizziness, no syncope. Patient with history of adenocarcinoma of sigmoid colon major depressive disorder, anxiety, COVID-19 positive-mild symptoms. History obtained from review of EMR, discussion with primary nurse.  Rest of 10 point ROS asked and negative.  HOSPICE ELIGIBILITY/DIAGNOSIS: TBD  PAST MEDICAL HISTORY:  Past Medical History:  Diagnosis Date  . Allergic rhinitis   . Anxiety   . Arthritis   . Bell's palsy   . Cataract   . Cirrhosis ([place])   . Coarse tremors   . Colon cancer ([place])   . Diabetes mellitus without complication ([place])   . Diverticulosis   . DVT (deep venous thrombosis) ([place])   . GERD (gastroesophageal reflux disease)   . GI bleed   . Hepatitis B   . History of blood transfusion 05/2019  .  History of panic attacks   . Hypertension    . Insomnia   . Iron deficiency anemia   . MDD (major depressive disorder)   . Morbid obesity ([place])   . Neuropathy   . Portal vein thrombosis   . Psoriasis   . Renal insufficiency     SOCIAL HX: Currently in SNF for ongoing care Social History   Tobacco Use  . Smoking status: Current Every Day Smoker    Packs/day: 0.50    Types: Cigarettes  . Smokeless tobacco: Never Used  Substance Use Topics  . Alcohol use: No   FAMILY HX:  Family History  Problem Relation Age of Onset  . Kidney failure Mother   . Diabetes Mother   . Obesity Mother   . Hypertension Mother   . Cirrhosis Father   . Hypertension Sister   . Kidney failure Brother   . Hypertension Brother   . Hypertension Sister   . Alcoholism Brother   . Other Brother        MRSA  . Cirrhosis Daughter   . Drug abuse Daughter      ALLERGIES:  Allergies  Allergen Reactions  . Xarelto [Rivaroxaban] Rash    Severe rash with itching  . Erythromycin Other (See Comments)    Oral Thrush  . Lipitor [Atorvastatin] Other (See Comments)    Leg cramps  . Pravachol [Pravastatin] Other (See Comments)    Leg muscle cramps  . Ivp Dye [Iodinated Diagnostic Agents] Rash     PERTINENT MEDICATIONS:  Outpatient Encounter Medications as of 05/24/2020  Medication Sig  . acetaminophen (TYLENOL) 500 MG tablet Take 1,000 mg by mouth every 6 (six) hours as needed for mild pain.   Marland Kitchen albuterol (VENTOLIN HFA) 108 (90 Base) MCG/ACT inhaler Inhale 2 puffs into the lungs every 6 (six) hours as needed for wheezing or shortness of breath.   . B-D ULTRAFINE III SHORT PEN 31G X 8 MM MISC 1 each by Other route daily. For lantus  . Calcium Carb-Cholecalciferol (CALCIUM 600+D) 600-800 MG-UNIT TABS Take 2 tablets by mouth at bedtime.  . carboxymethylcellulose (REFRESH PLUS) 0.5 % SOLN Place 1 drop into both eyes 3 (three) times daily as needed (dry/irritated eyes).  . carvedilol (COREG) 3.125 MG tablet Take 3.125 mg by mouth 2 (two) times daily  with a meal.  . citalopram (CELEXA) 10 MG tablet Take 30 mg by mouth at bedtime.   . clobetasol ointment (TEMOVATE) 0.05 % Apply topically 2 (two) times daily. Apply to affected rash  . diclofenac Sodium (VOLTAREN) 1 % GEL Apply 2 g topically as needed for pain.  . diphenhydrAMINE (BENADRYL) 25 mg capsule Take 1 capsule (25 mg total) by mouth every 6 (six) hours as needed for itching.  Marland Kitchen ELIQUIS 5 MG TABS tablet TAKE 1 TABLET BY MOUTH TWICE A DAY (Patient taking differently: Take 5 mg by mouth 2 (two) times daily. )  . ferrous sulfate 325 (65 FE) MG tablet Take 325 mg by mouth daily with breakfast.  . fluticasone (FLONASE) 50 MCG/ACT nasal spray Place 1-2 sprays into both nostrils daily as needed (allergies.).   Marland Kitchen furosemide (LASIX) 20 MG tablet Take 1 tablet (20 mg total) by mouth daily.  Marland Kitchen gabapentin (NEURONTIN) 300 MG capsule Take 2 capsules (600 mg total) by mouth 3 (three) times daily.  . Insulin Glargine (BASAGLAR KWIKPEN) 100 UNIT/ML SOPN Inject 40 Units into the skin at bedtime.  Marland Kitchen nystatin (MYCOSTATIN/NYSTOP) powder Apply topically 2 (two) times daily. Apply  to folds of abdomen, groin and breasts  . omeprazole (PRILOSEC) 20 MG capsule Take 20 mg by mouth at bedtime.  . polyethylene glycol powder (GLYCOLAX/MIRALAX) 17 GM/SCOOP powder Take 17 g by mouth in the morning and at bedtime.  . senna-docusate (SENOKOT-S) 8.6-50 MG tablet Take 1 tablet by mouth 2 (two) times daily as needed for moderate constipation.  . Zinc Oxide (TRIPLE PASTE) 12.8 % ointment Apply topically 2 (two) times daily. Apply to skin breakdown around buttocks   No facility-administered encounter medications on file as of 05/24/2020.  Physical exam deferred.  Patient in isolation for COVID-19.  Palliative Care was asked to follow this patient by consultation request of Gay Filler, MD to help address advance care planning and complex decision making. Thank you for the opportunity to participate in the care of Tonya Dougherty Please call our office at [phone] if we can be of additional assistance.  Note: Portions of this note were generated with [employer]. Dictation errors may occur despite best attempts at proofreading.  Teodoro Spray, NP

## 2020-05-25 ENCOUNTER — Telehealth: Payer: Self-pay

## 2020-05-25 NOTE — Telephone Encounter (Signed)
Patient calls stating that the SNF she is currently residing in is telling her she needs to reschedule her appointment on 2/7 at 74 with Dr. Benay Spice due to all of the many Covid cases they currently have.  The patient herself had Covid on 10-Sep-1950 but is completely recovered.  I have rescheduled her for 06/27/2020 at 11:00 with Dr. Benay Spice.  She will make the facility aware.

## 2020-06-06 ENCOUNTER — Ambulatory Visit: Payer: Medicare HMO | Admitting: Oncology

## 2020-06-27 ENCOUNTER — Other Ambulatory Visit: Payer: Self-pay

## 2020-06-27 ENCOUNTER — Inpatient Hospital Stay: Payer: Medicare HMO | Attending: Oncology | Admitting: Oncology

## 2020-06-27 ENCOUNTER — Telehealth: Payer: Self-pay | Admitting: Oncology

## 2020-06-27 VITALS — BP 136/76 | HR 77 | Temp 98.1°F | Resp 18 | Ht 62.0 in | Wt 255.7 lb

## 2020-06-27 DIAGNOSIS — F32A Depression, unspecified: Secondary | ICD-10-CM | POA: Insufficient documentation

## 2020-06-27 DIAGNOSIS — Z923 Personal history of irradiation: Secondary | ICD-10-CM | POA: Diagnosis not present

## 2020-06-27 DIAGNOSIS — C187 Malignant neoplasm of sigmoid colon: Secondary | ICD-10-CM | POA: Diagnosis not present

## 2020-06-27 DIAGNOSIS — F419 Anxiety disorder, unspecified: Secondary | ICD-10-CM | POA: Insufficient documentation

## 2020-06-27 DIAGNOSIS — E785 Hyperlipidemia, unspecified: Secondary | ICD-10-CM | POA: Diagnosis not present

## 2020-06-27 DIAGNOSIS — I10 Essential (primary) hypertension: Secondary | ICD-10-CM | POA: Insufficient documentation

## 2020-06-27 DIAGNOSIS — Z86718 Personal history of other venous thrombosis and embolism: Secondary | ICD-10-CM | POA: Insufficient documentation

## 2020-06-27 DIAGNOSIS — Z9221 Personal history of antineoplastic chemotherapy: Secondary | ICD-10-CM | POA: Diagnosis not present

## 2020-06-27 DIAGNOSIS — E119 Type 2 diabetes mellitus without complications: Secondary | ICD-10-CM | POA: Diagnosis not present

## 2020-06-27 DIAGNOSIS — N189 Chronic kidney disease, unspecified: Secondary | ICD-10-CM | POA: Diagnosis not present

## 2020-06-27 NOTE — Telephone Encounter (Signed)
Scheduled appointments per 2/28 los. Spoke to patient who is aware of appointments dates and times.  °

## 2020-06-27 NOTE — Progress Notes (Signed)
  [facility] OFFICE PROGRESS NOTE   Diagnosis: Colon cancer  INTERVAL HISTORY:   Tonya Dougherty returns as scheduled.  She continues to reside in a skilled nursing facility.  She is participating in physical therapy.  She reports ambulating with a walker.  Pain has improved.  Good appetite.  She has intermittent urinary incontinence.  Objective:  Vital signs in last 24 hours:  Blood pressure 136/76, pulse 77, temperature 98.1 F (36.7 C), temperature source Tympanic, resp. rate 18, height 5' 2" (1.575 m), weight 255 lb 11.2 oz (116 kg), SpO2 96 %.     Lymphatics: No cervical, supraclavicular, axillary, or inguinal nodes Resp: Lungs clear bilaterally Cardio: Regular rate and rhythm GI: No mass, nontender, no hepatosplenomegaly Vascular: Chronic edema/stasis change in the left greater than right lower leg     Lab Results:  Lab Results  Component Value Date   WBC 3.6 (L) 04/13/2020   HGB 13.1 04/13/2020   HCT 42.5 04/13/2020   MCV 97.5 04/13/2020   PLT 139 (L) 04/13/2020   NEUTROABS 1.9 04/13/2020    CMP  Lab Results  Component Value Date   NA 139 04/13/2020   K 4.0 04/13/2020   CL 103 04/13/2020   CO2 27 04/13/2020   GLUCOSE 105 (H) 04/13/2020   BUN 26 (H) 04/13/2020   CREATININE 0.92 04/13/2020   CALCIUM 9.4 04/13/2020   PROT 6.8 04/13/2020   ALBUMIN 2.9 (L) 04/13/2020   AST 42 (H) 04/13/2020   ALT 20 04/13/2020   ALKPHOS 121 04/13/2020   BILITOT 1.4 (H) 04/13/2020   GFRNONAA >60 04/13/2020   GFRAA >60 08/16/2019    Lab Results  Component Value Date   CEA1 27.7 (H) 04/01/2020     Medications: I have reviewed the patient's current medications.   Assessment/Plan: 1.Metastatic colon cancer,pT3, pN0 adenocarcinoma of the sigmoid colon diagnosed April 2021, status post sigmoidectomy 08/14/2019- MSI Stable  Left sacral mass consistent with colorectalprimary -MRI of the thoracic and lumbar spine 03/24/2020 -abnormal T2/STIR hyperintense  lesion involving the central and left aspect of the sacrum concerning for osseous metastatic disease, probable invasion of adjacent left S1-S2 neural foramina, extraosseous extension into the adjacent presacral space -CT chest/abdomen/pelvis 03/25/2020-destructive left sacral lesion, associated 2.8 x 3.5 cm soft tissue component anterior to the left sacrum worrisome for metastasis, 2.6 cm hypoenhancing mass in the right upper kidney-? Metastasis versus renal cell carcinoma -CT-guided biopsy of left sacral mass 03/28/2020-metastatic adenocarcinoma consistent with GI primary, MSS, tumor mutation burden 4, IDH 1, K-ras wild-type -Radiation to left sacral mass 04/01/2020-04/14/2020 2.History of iron deficiency anemia 3. Cirrhosis with splenomegaly 5. Left lower extremity DVT diagnosed 2019 6. Hypertension 7. Diabetes mellitus 8. Benign essential tremor 9. CKD 10. Hyperlipidemia 11. Morbid obesity 12. Anxiety and depression 13. 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  Pain secondary to #1, improved following palliative radiation 16.  Right renal mass on CT 03/25/2020      Disposition: Tonya Dougherty has metastatic colon cancer.  Her overall clinical status has improved following the course of palliative radiation to the sacral mass.  She continues physical therapy.  She will return for an office visit and restaging CTs in approximately 6 weeks.  We will consider systemic treatment options if there is radiologic evidence of disease progression.  Tonya Dougherty reports that she cannot tolerate IV contrast due to a rash with pruritus despite premedication.  Betsy Coder, MD  06/27/2020  11:44 AM

## 2020-07-01 NOTE — Addendum Note (Signed)
Addended by: Tania Ade on: 07/01/2020 05:09 PM   Modules accepted: Orders

## 2020-08-05 ENCOUNTER — Ambulatory Visit (HOSPITAL_BASED_OUTPATIENT_CLINIC_OR_DEPARTMENT_OTHER)
Admission: RE | Admit: 2020-08-05 | Discharge: 2020-08-05 | Disposition: A | Discharge status: Home / Self Care | Payer: Medicare HMO | Source: Ambulatory Visit | Attending: Oncology | Admitting: Oncology

## 2020-08-05 ENCOUNTER — Other Ambulatory Visit: Payer: Self-pay

## 2020-08-05 DIAGNOSIS — C187 Malignant neoplasm of sigmoid colon: Secondary | ICD-10-CM | POA: Diagnosis not present

## 2020-08-05 IMAGING — CT CT CHEST W/O CM
1 of 2 series · 10 of 32 positions shown, 13 images · non-contrast
Comparison: CT [DATE] and [DATE]
COMPARISON: CT [DATE] and [DATE]

Addendum:
CLINICAL DATA: Restaging colon cancer. Assess treatment response.
No current complaints.

EXAM:
CT CHEST, ABDOMEN AND PELVIS WITHOUT CONTRAST
TECHNIQUE: Multidetector CT imaging of the chest, abdomen and pelvis was
performed following the standard protocol without IV contrast.

[Series 2: cap without · axial · non-contrast · 0.78mm/px · z∈[+764,+1314]mm · 10 of 130 slices shown, 13 images]
[im 10/130  soft-tissue]
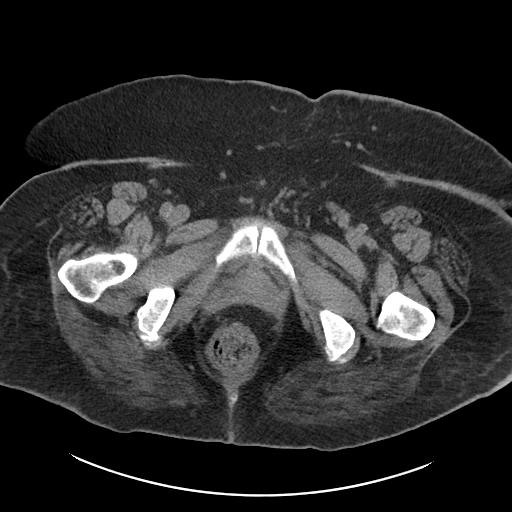
[im 10/130  bone]
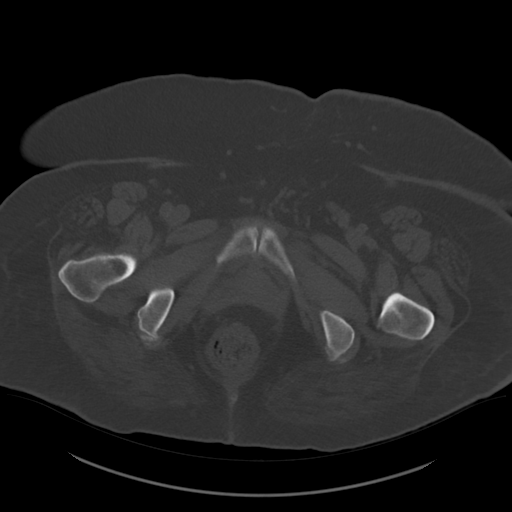
[im 28/130  soft-tissue]
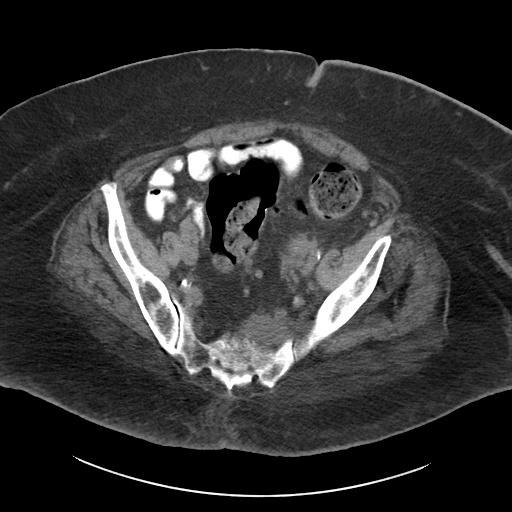
[im 47/130  soft-tissue]
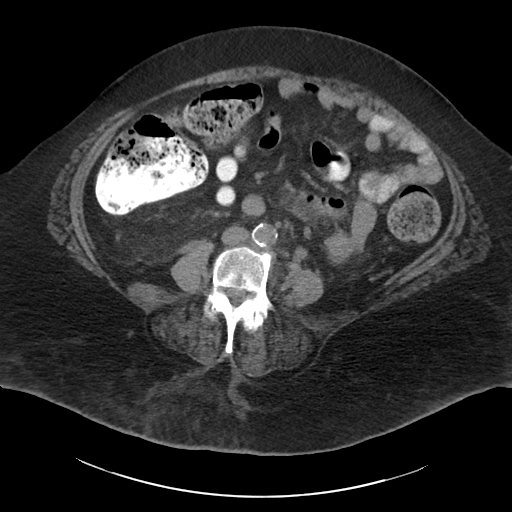
[im 56/130  soft-tissue]
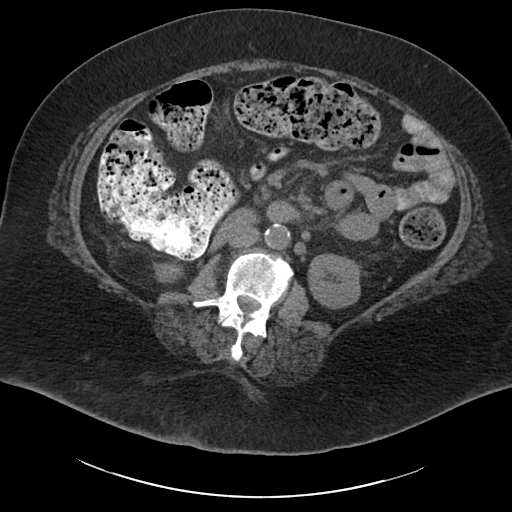
[im 74/130  soft-tissue]
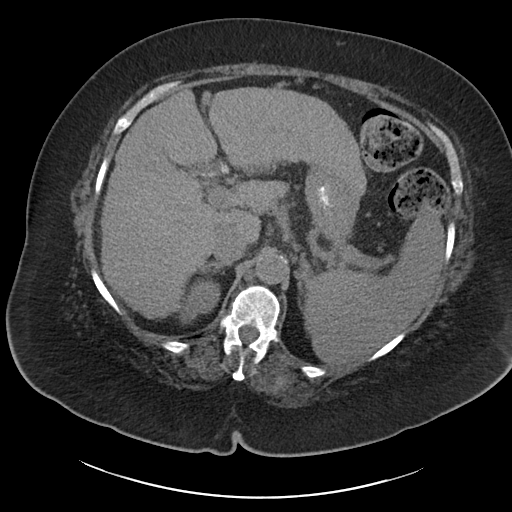
[im 83/130  soft-tissue]
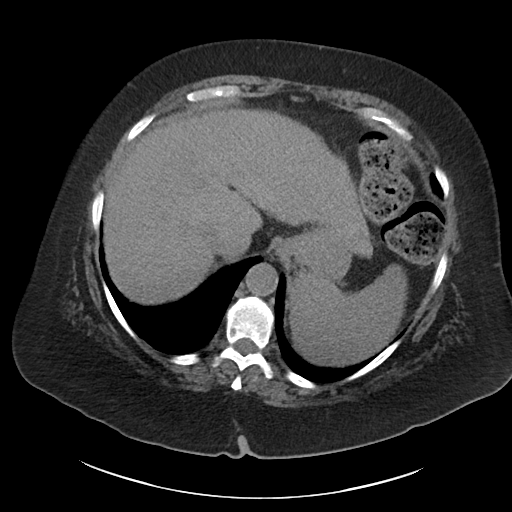
[im 93/130  lung]
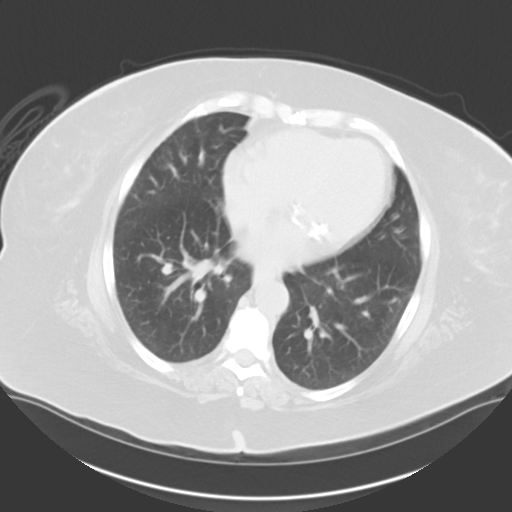
[im 102/130  soft-tissue]
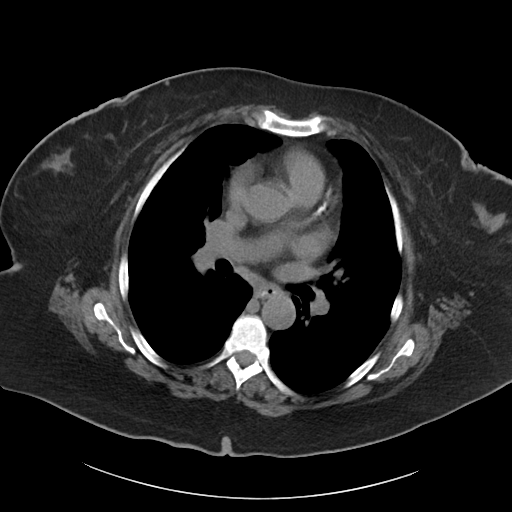
[im 102/130  lung]
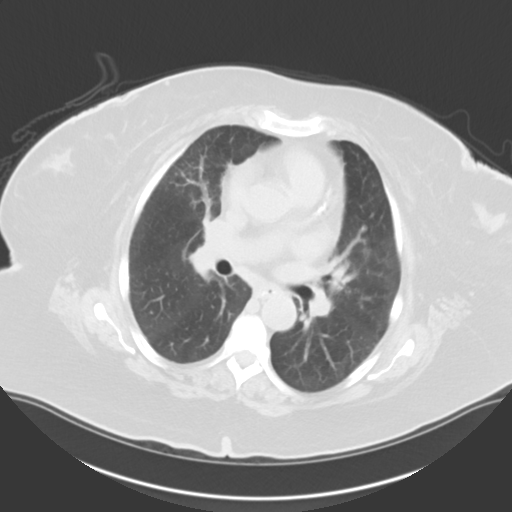
[im 111/130  lung]
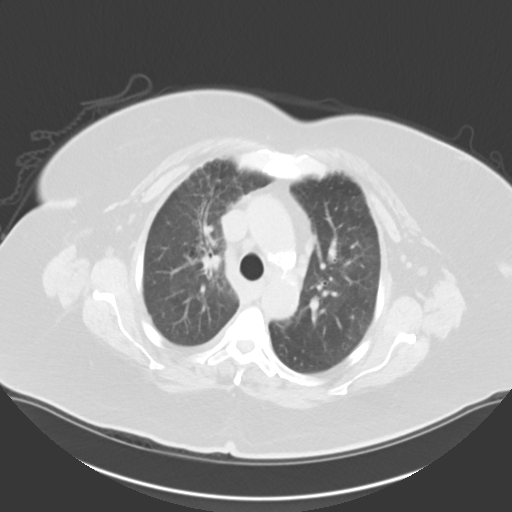
[im 120/130  soft-tissue]
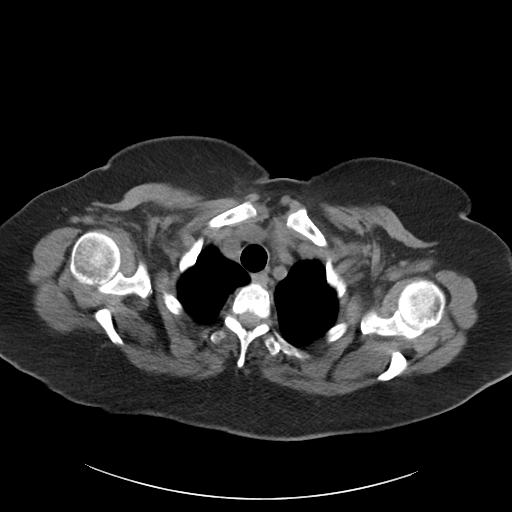
[im 120/130  lung]
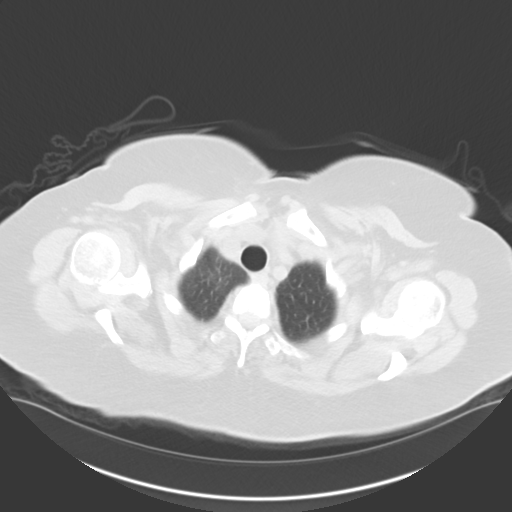

[10 of 32 positions shown; findings below may reference images not displayed]

FINDINGS: CT CHEST FINDINGS

Cardiovascular: Diffuse atherosclerosis of the aorta, great vessels
and coronary arteries. Mitral annular calcifications are present. No
acute vascular findings on noncontrast imaging. The heart size is
normal. There is no pericardial effusion.

Mediastinum/Nodes: There are no enlarged mediastinal, hilar or
axillary lymph nodes. There are several small mediastinal lymph
nodes which have mildly increased in size compared with the prior
study, including a 7 mm right paratracheal node on image [DATE], an AP
window node measuring 7 mm on image [DATE] and a right paratracheal
node measuring 9 mm on image [DATE]. Stable heterogeneous enlargement
of the thyroid gland with multiple calcifications. The trachea and
esophagus appear unremarkable.

Lungs/Pleura: No pleural effusion or pneumothorax. Mild
centrilobular and paraseptal emphysema with scattered perihilar
scarring and bronchiectasis bilaterally. No suspicious pulmonary
nodularity.

Musculoskeletal/Chest wall: No chest wall mass or suspicious osseous
findings. Grossly stable fibroglandular densities in the breasts.

CT ABDOMEN AND PELVIS FINDINGS

Hepatobiliary: Morphologic changes of cirrhosis are again noted
without focal hepatic abnormality on noncontrast imaging. There are
numerous gallstones in the gallbladder lumen. No gallbladder wall
thickening, surrounding inflammation or evidence of biliary
dilatation.

Pancreas: Unremarkable. No pancreatic ductal dilatation or
surrounding inflammatory changes.

Spleen: Stable mild splenomegaly.

Adrenals/Urinary Tract: Both adrenal glands appear normal. No
evidence of urinary tract calculus or hydronephrosis. A previously
demonstrated hypoenhancing lesion in the interpolar region of the
right kidney is not well seen on this noncontrast study, but has not
clearly resolved. No new or enlarging renal lesions are identified
on noncontrast imaging. The bladder is decompressed with diffuse
wall thickening and possible mild surrounding inflammation.

Stomach/Bowel: Enteric contrast was administered and has passed into
the distal small bowel and proximal colon. The stomach appears
unremarkable for its degree of distension. No evidence of bowel wall
thickening, distention or surrounding inflammatory change. There is
increased prominent stool throughout the proximal to mid colon
consistent with constipation. The sigmoid colon anastomosis appears
patent.

Vascular/Lymphatic: There are no enlarged abdominal or pelvic lymph
nodes. Small retroperitoneal and pelvic sidewall nodes are
unchanged, including a presacral node measuring 9 mm on image 94/2
and a left external iliac node measuring 11 mm on image 113/2.
Diffuse aortic and branch vessel atherosclerosis without aneurysm or
acute vascular findings on noncontrast imaging.

Reproductive: The uterus appears unremarkable. No new suspicious
adnexal findings.

Other: Presacral soft tissue mass appears similar, measuring 3.3 x
2.2 cm on image 104/2 (previously 3.5 x 2.8 cm). No ascites or
peritoneal nodularity. Intact abdominal wall. There are small
collateral vessels within the low anterior pelvic wall.

Musculoskeletal: Lytic left sacral lesion appears unchanged. No new
osseous lesions or pathologic fractures identified. There is
multilevel lumbar spondylosis.
IMPRESSION: 1. Similar size and appearance of left presacral soft tissue
metastasis and lytic lesion within the left sacrum.
2. No progressive metastatic disease identified.
3. A previously identified indeterminate lesion in the interpolar
region of the right kidney is not well seen on this noncontrast
study but has not clearly resolved. As previously discussed, this
could reflect an inflammatory lesion or metastasis. Attention on
follow-up recommended.
4. New bladder wall thickening, probably related to pelvic
irradiation.
5. Mildly increased size of small mediastinal lymph nodes,
nonspecific and possibly reactive. Small retroperitoneal and pelvic
lymph nodes are unchanged.
6. Stable additional incidental findings including morphologic
changes of cirrhosis, splenomegaly, cholelithiasis and Aortic
Atherosclerosis ([35]-[35]).

ADDENDUM:
Not mentioned in the original impression is heterogeneous
enlargement of the thyroid gland with multiple calcifications,
unchanged from previous CT examinations. If not previously
performed, consider further evaluation with thyroid ultrasound.
(Ref: [HOSPITAL]. [DATE]): 143-50).

*** End of Addendum ***
FINDINGS: CT CHEST FINDINGS

Cardiovascular: Diffuse atherosclerosis of the aorta, great vessels
and coronary arteries. Mitral annular calcifications are present. No
acute vascular findings on noncontrast imaging. The heart size is
normal. There is no pericardial effusion.

Mediastinum/Nodes: There are no enlarged mediastinal, hilar or
axillary lymph nodes. There are several small mediastinal lymph
nodes which have mildly increased in size compared with the prior
study, including a 7 mm right paratracheal node on image [DATE], an AP
window node measuring 7 mm on image [DATE] and a right paratracheal
node measuring 9 mm on image [DATE]. Stable heterogeneous enlargement
of the thyroid gland with multiple calcifications. The trachea and
esophagus appear unremarkable.

Lungs/Pleura: No pleural effusion or pneumothorax. Mild
centrilobular and paraseptal emphysema with scattered perihilar
scarring and bronchiectasis bilaterally. No suspicious pulmonary
nodularity.

Musculoskeletal/Chest wall: No chest wall mass or suspicious osseous
findings. Grossly stable fibroglandular densities in the breasts.

CT ABDOMEN AND PELVIS FINDINGS

Hepatobiliary: Morphologic changes of cirrhosis are again noted
without focal hepatic abnormality on noncontrast imaging. There are
numerous gallstones in the gallbladder lumen. No gallbladder wall
thickening, surrounding inflammation or evidence of biliary
dilatation.

Pancreas: Unremarkable. No pancreatic ductal dilatation or
surrounding inflammatory changes.

Spleen: Stable mild splenomegaly.

Adrenals/Urinary Tract: Both adrenal glands appear normal. No
evidence of urinary tract calculus or hydronephrosis. A previously
demonstrated hypoenhancing lesion in the interpolar region of the
right kidney is not well seen on this noncontrast study, but has not
clearly resolved. No new or enlarging renal lesions are identified
on noncontrast imaging. The bladder is decompressed with diffuse
wall thickening and possible mild surrounding inflammation.

Stomach/Bowel: Enteric contrast was administered and has passed into
the distal small bowel and proximal colon. The stomach appears
unremarkable for its degree of distension. No evidence of bowel wall
thickening, distention or surrounding inflammatory change. There is
increased prominent stool throughout the proximal to mid colon
consistent with constipation. The sigmoid colon anastomosis appears
patent.

Vascular/Lymphatic: There are no enlarged abdominal or pelvic lymph
nodes. Small retroperitoneal and pelvic sidewall nodes are
unchanged, including a presacral node measuring 9 mm on image 94/2
and a left external iliac node measuring 11 mm on image 113/2.
Diffuse aortic and branch vessel atherosclerosis without aneurysm or
acute vascular findings on noncontrast imaging.

Reproductive: The uterus appears unremarkable. No new suspicious
adnexal findings.

Other: Presacral soft tissue mass appears similar, measuring 3.3 x
2.2 cm on image 104/2 (previously 3.5 x 2.8 cm). No ascites or
peritoneal nodularity. Intact abdominal wall. There are small
collateral vessels within the low anterior pelvic wall.

Musculoskeletal: Lytic left sacral lesion appears unchanged. No new
osseous lesions or pathologic fractures identified. There is
multilevel lumbar spondylosis.
IMPRESSION: 1. Similar size and appearance of left presacral soft tissue
metastasis and lytic lesion within the left sacrum.
2. No progressive metastatic disease identified.
3. A previously identified indeterminate lesion in the interpolar
region of the right kidney is not well seen on this noncontrast
study but has not clearly resolved. As previously discussed, this
could reflect an inflammatory lesion or metastasis. Attention on
follow-up recommended.
4. New bladder wall thickening, probably related to pelvic
irradiation.
5. Mildly increased size of small mediastinal lymph nodes,
nonspecific and possibly reactive. Small retroperitoneal and pelvic
lymph nodes are unchanged.
6. Stable additional incidental findings including morphologic
changes of cirrhosis, splenomegaly, cholelithiasis and Aortic
Atherosclerosis ([35]-[35]).

## 2020-08-05 IMAGING — CT CT ABD-PELV W/O CM
1 of 3 series · 11 of 32 positions shown, 13 images · non-contrast
Comparison: CT [DATE] and [DATE]
COMPARISON: CT [DATE] and [DATE]

Addendum:
CLINICAL DATA: Restaging colon cancer. Assess treatment response.
No current complaints.

EXAM:
CT CHEST, ABDOMEN AND PELVIS WITHOUT CONTRAST
TECHNIQUE: Multidetector CT imaging of the chest, abdomen and pelvis was
performed following the standard protocol without IV contrast.

[Series 3: thins · axial · 0.78mm/px · z∈[+770,+1336]mm · 11 of 996 slices shown, 13 images]
[im 83/996  soft-tissue]
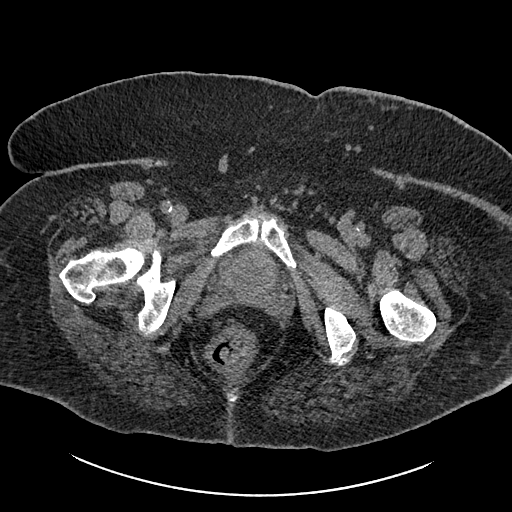
[im 83/996  bone]
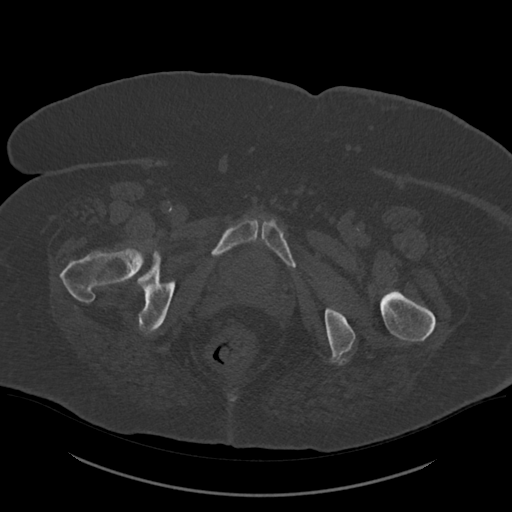
[im 208/996  soft-tissue]
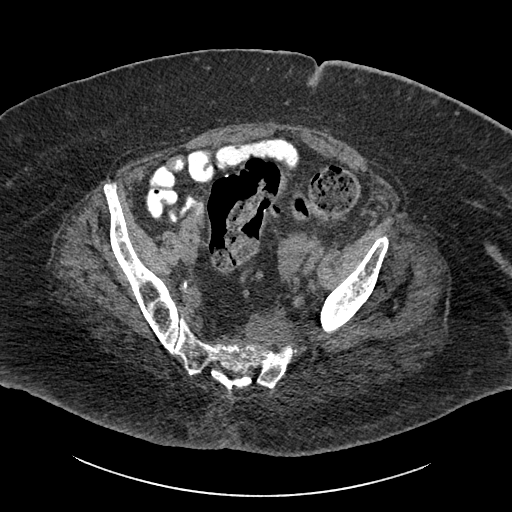
[im 332/996  soft-tissue]
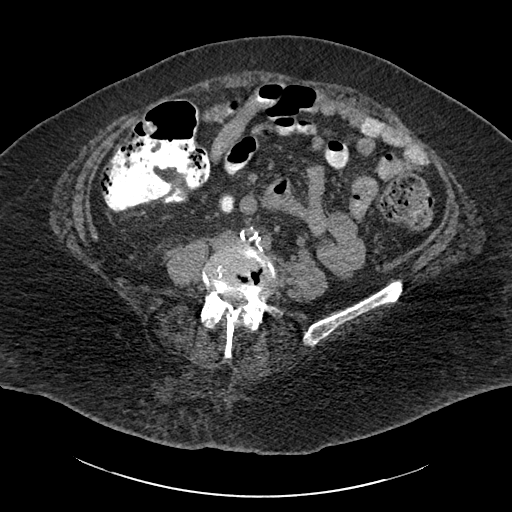
[im 457/996  soft-tissue]
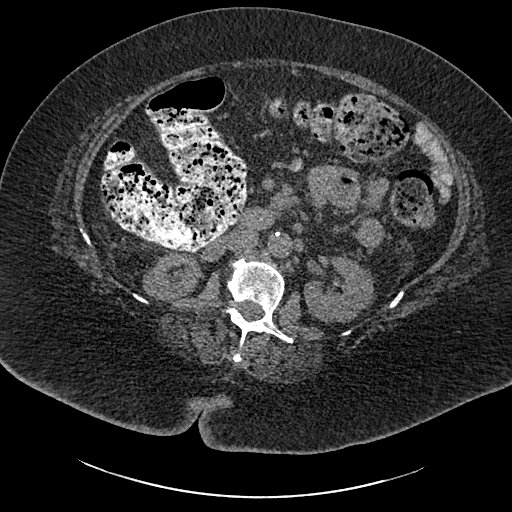
[im 539/996  soft-tissue]
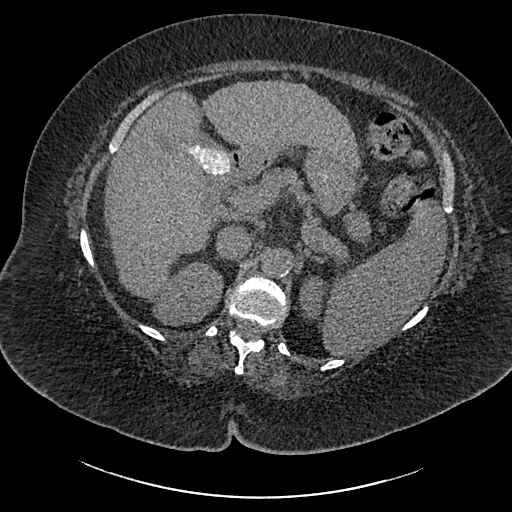
[im 664/996  soft-tissue]
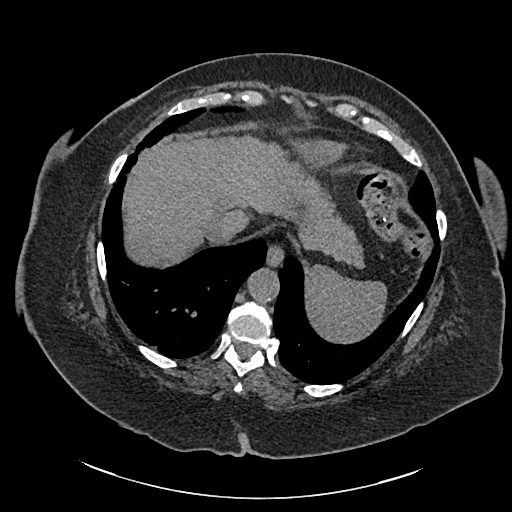
[im 788/996  soft-tissue]
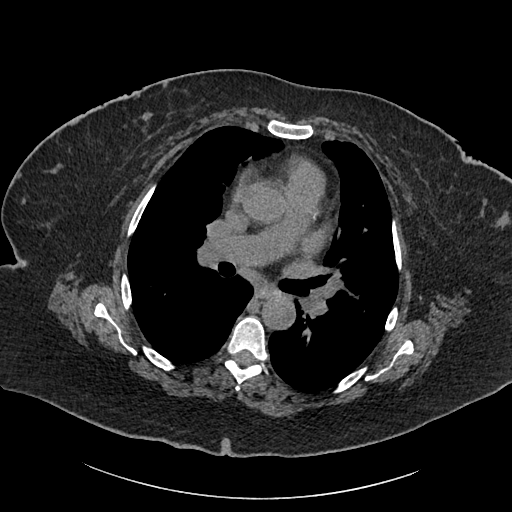
[im 830/996  lung]
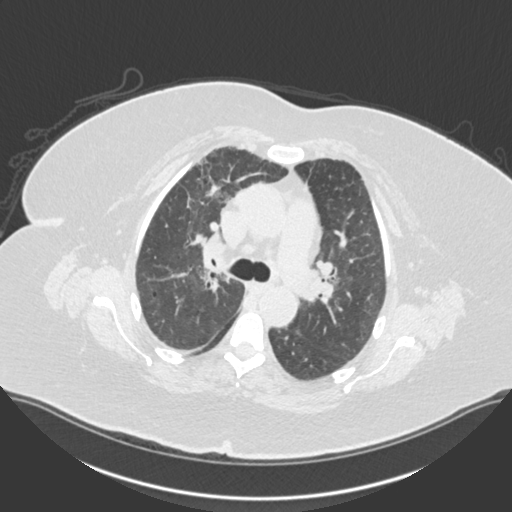
[im 871/996  lung]
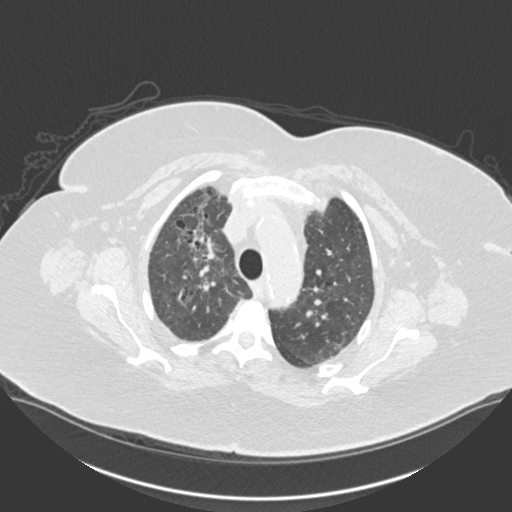
[im 913/996  soft-tissue]
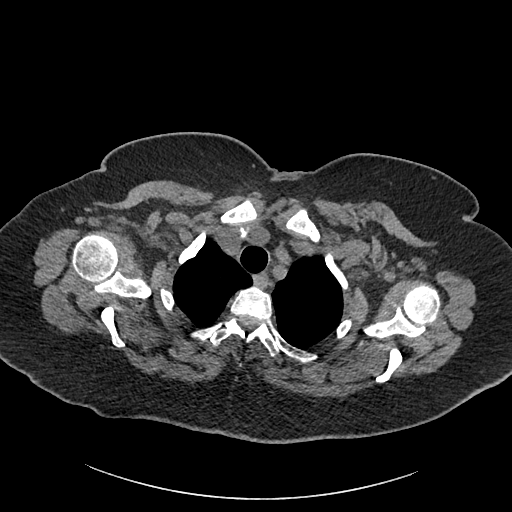
[im 913/996  lung]
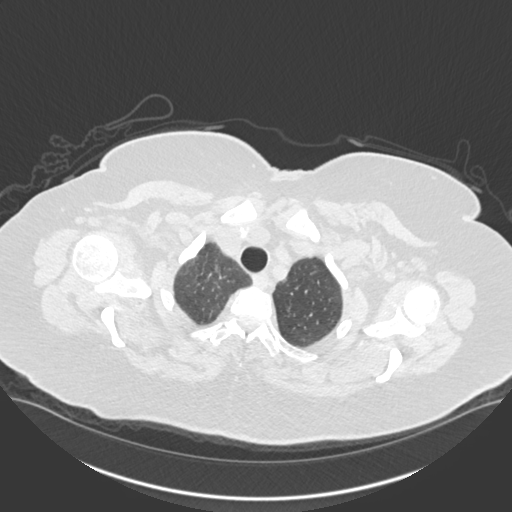
[im 954/996  lung]
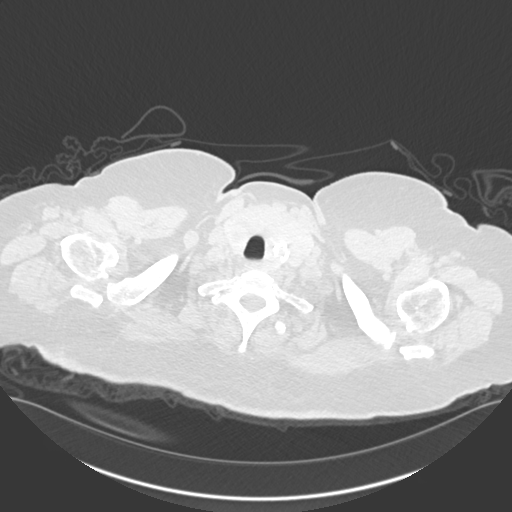

[11 of 32 positions shown; findings below may reference images not displayed]

FINDINGS: CT CHEST FINDINGS

Cardiovascular: Diffuse atherosclerosis of the aorta, great vessels
and coronary arteries. Mitral annular calcifications are present. No
acute vascular findings on noncontrast imaging. The heart size is
normal. There is no pericardial effusion.

Mediastinum/Nodes: There are no enlarged mediastinal, hilar or
axillary lymph nodes. There are several small mediastinal lymph
nodes which have mildly increased in size compared with the prior
study, including a 7 mm right paratracheal node on image [DATE], an AP
window node measuring 7 mm on image [DATE] and a right paratracheal
node measuring 9 mm on image [DATE]. Stable heterogeneous enlargement
of the thyroid gland with multiple calcifications. The trachea and
esophagus appear unremarkable.

Lungs/Pleura: No pleural effusion or pneumothorax. Mild
centrilobular and paraseptal emphysema with scattered perihilar
scarring and bronchiectasis bilaterally. No suspicious pulmonary
nodularity.

Musculoskeletal/Chest wall: No chest wall mass or suspicious osseous
findings. Grossly stable fibroglandular densities in the breasts.

CT ABDOMEN AND PELVIS FINDINGS

Hepatobiliary: Morphologic changes of cirrhosis are again noted
without focal hepatic abnormality on noncontrast imaging. There are
numerous gallstones in the gallbladder lumen. No gallbladder wall
thickening, surrounding inflammation or evidence of biliary
dilatation.

Pancreas: Unremarkable. No pancreatic ductal dilatation or
surrounding inflammatory changes.

Spleen: Stable mild splenomegaly.

Adrenals/Urinary Tract: Both adrenal glands appear normal. No
evidence of urinary tract calculus or hydronephrosis. A previously
demonstrated hypoenhancing lesion in the interpolar region of the
right kidney is not well seen on this noncontrast study, but has not
clearly resolved. No new or enlarging renal lesions are identified
on noncontrast imaging. The bladder is decompressed with diffuse
wall thickening and possible mild surrounding inflammation.

Stomach/Bowel: Enteric contrast was administered and has passed into
the distal small bowel and proximal colon. The stomach appears
unremarkable for its degree of distension. No evidence of bowel wall
thickening, distention or surrounding inflammatory change. There is
increased prominent stool throughout the proximal to mid colon
consistent with constipation. The sigmoid colon anastomosis appears
patent.

Vascular/Lymphatic: There are no enlarged abdominal or pelvic lymph
nodes. Small retroperitoneal and pelvic sidewall nodes are
unchanged, including a presacral node measuring 9 mm on image 94/2
and a left external iliac node measuring 11 mm on image 113/2.
Diffuse aortic and branch vessel atherosclerosis without aneurysm or
acute vascular findings on noncontrast imaging.

Reproductive: The uterus appears unremarkable. No new suspicious
adnexal findings.

Other: Presacral soft tissue mass appears similar, measuring 3.3 x
2.2 cm on image 104/2 (previously 3.5 x 2.8 cm). No ascites or
peritoneal nodularity. Intact abdominal wall. There are small
collateral vessels within the low anterior pelvic wall.

Musculoskeletal: Lytic left sacral lesion appears unchanged. No new
osseous lesions or pathologic fractures identified. There is
multilevel lumbar spondylosis.
IMPRESSION: 1. Similar size and appearance of left presacral soft tissue
metastasis and lytic lesion within the left sacrum.
2. No progressive metastatic disease identified.
3. A previously identified indeterminate lesion in the interpolar
region of the right kidney is not well seen on this noncontrast
study but has not clearly resolved. As previously discussed, this
could reflect an inflammatory lesion or metastasis. Attention on
follow-up recommended.
4. New bladder wall thickening, probably related to pelvic
irradiation.
5. Mildly increased size of small mediastinal lymph nodes,
nonspecific and possibly reactive. Small retroperitoneal and pelvic
lymph nodes are unchanged.
6. Stable additional incidental findings including morphologic
changes of cirrhosis, splenomegaly, cholelithiasis and Aortic
Atherosclerosis ([35]-[35]).

ADDENDUM:
Not mentioned in the original impression is heterogeneous
enlargement of the thyroid gland with multiple calcifications,
unchanged from previous CT examinations. If not previously
performed, consider further evaluation with thyroid ultrasound.
(Ref: [HOSPITAL]. [DATE]): 143-50).

*** End of Addendum ***
FINDINGS: CT CHEST FINDINGS

Cardiovascular: Diffuse atherosclerosis of the aorta, great vessels
and coronary arteries. Mitral annular calcifications are present. No
acute vascular findings on noncontrast imaging. The heart size is
normal. There is no pericardial effusion.

Mediastinum/Nodes: There are no enlarged mediastinal, hilar or
axillary lymph nodes. There are several small mediastinal lymph
nodes which have mildly increased in size compared with the prior
study, including a 7 mm right paratracheal node on image [DATE], an AP
window node measuring 7 mm on image [DATE] and a right paratracheal
node measuring 9 mm on image [DATE]. Stable heterogeneous enlargement
of the thyroid gland with multiple calcifications. The trachea and
esophagus appear unremarkable.

Lungs/Pleura: No pleural effusion or pneumothorax. Mild
centrilobular and paraseptal emphysema with scattered perihilar
scarring and bronchiectasis bilaterally. No suspicious pulmonary
nodularity.

Musculoskeletal/Chest wall: No chest wall mass or suspicious osseous
findings. Grossly stable fibroglandular densities in the breasts.

CT ABDOMEN AND PELVIS FINDINGS

Hepatobiliary: Morphologic changes of cirrhosis are again noted
without focal hepatic abnormality on noncontrast imaging. There are
numerous gallstones in the gallbladder lumen. No gallbladder wall
thickening, surrounding inflammation or evidence of biliary
dilatation.

Pancreas: Unremarkable. No pancreatic ductal dilatation or
surrounding inflammatory changes.

Spleen: Stable mild splenomegaly.

Adrenals/Urinary Tract: Both adrenal glands appear normal. No
evidence of urinary tract calculus or hydronephrosis. A previously
demonstrated hypoenhancing lesion in the interpolar region of the
right kidney is not well seen on this noncontrast study, but has not
clearly resolved. No new or enlarging renal lesions are identified
on noncontrast imaging. The bladder is decompressed with diffuse
wall thickening and possible mild surrounding inflammation.

Stomach/Bowel: Enteric contrast was administered and has passed into
the distal small bowel and proximal colon. The stomach appears
unremarkable for its degree of distension. No evidence of bowel wall
thickening, distention or surrounding inflammatory change. There is
increased prominent stool throughout the proximal to mid colon
consistent with constipation. The sigmoid colon anastomosis appears
patent.

Vascular/Lymphatic: There are no enlarged abdominal or pelvic lymph
nodes. Small retroperitoneal and pelvic sidewall nodes are
unchanged, including a presacral node measuring 9 mm on image 94/2
and a left external iliac node measuring 11 mm on image 113/2.
Diffuse aortic and branch vessel atherosclerosis without aneurysm or
acute vascular findings on noncontrast imaging.

Reproductive: The uterus appears unremarkable. No new suspicious
adnexal findings.

Other: Presacral soft tissue mass appears similar, measuring 3.3 x
2.2 cm on image 104/2 (previously 3.5 x 2.8 cm). No ascites or
peritoneal nodularity. Intact abdominal wall. There are small
collateral vessels within the low anterior pelvic wall.

Musculoskeletal: Lytic left sacral lesion appears unchanged. No new
osseous lesions or pathologic fractures identified. There is
multilevel lumbar spondylosis.
IMPRESSION: 1. Similar size and appearance of left presacral soft tissue
metastasis and lytic lesion within the left sacrum.
2. No progressive metastatic disease identified.
3. A previously identified indeterminate lesion in the interpolar
region of the right kidney is not well seen on this noncontrast
study but has not clearly resolved. As previously discussed, this
could reflect an inflammatory lesion or metastasis. Attention on
follow-up recommended.
4. New bladder wall thickening, probably related to pelvic
irradiation.
5. Mildly increased size of small mediastinal lymph nodes,
nonspecific and possibly reactive. Small retroperitoneal and pelvic
lymph nodes are unchanged.
6. Stable additional incidental findings including morphologic
changes of cirrhosis, splenomegaly, cholelithiasis and Aortic
Atherosclerosis ([35]-[35]).

## 2020-08-08 ENCOUNTER — Encounter: Payer: Self-pay | Admitting: Nurse Practitioner

## 2020-08-08 ENCOUNTER — Other Ambulatory Visit: Payer: Medicare HMO

## 2020-08-08 ENCOUNTER — Other Ambulatory Visit: Payer: Self-pay

## 2020-08-08 ENCOUNTER — Inpatient Hospital Stay: Payer: Medicare HMO | Attending: Oncology | Admitting: Nurse Practitioner

## 2020-08-08 ENCOUNTER — Telehealth: Payer: Self-pay | Admitting: Nurse Practitioner

## 2020-08-08 ENCOUNTER — Inpatient Hospital Stay: Payer: Medicare HMO

## 2020-08-08 VITALS — BP 124/60 | HR 70 | Temp 97.9°F | Resp 20 | Ht 62.0 in | Wt 227.4 lb

## 2020-08-08 DIAGNOSIS — K8020 Calculus of gallbladder without cholecystitis without obstruction: Secondary | ICD-10-CM | POA: Insufficient documentation

## 2020-08-08 DIAGNOSIS — N189 Chronic kidney disease, unspecified: Secondary | ICD-10-CM | POA: Diagnosis not present

## 2020-08-08 DIAGNOSIS — I700 Atherosclerosis of aorta: Secondary | ICD-10-CM | POA: Diagnosis not present

## 2020-08-08 DIAGNOSIS — K7460 Unspecified cirrhosis of liver: Secondary | ICD-10-CM | POA: Insufficient documentation

## 2020-08-08 DIAGNOSIS — F419 Anxiety disorder, unspecified: Secondary | ICD-10-CM | POA: Diagnosis not present

## 2020-08-08 DIAGNOSIS — F17210 Nicotine dependence, cigarettes, uncomplicated: Secondary | ICD-10-CM | POA: Insufficient documentation

## 2020-08-08 DIAGNOSIS — C7951 Secondary malignant neoplasm of bone: Secondary | ICD-10-CM | POA: Diagnosis not present

## 2020-08-08 DIAGNOSIS — I129 Hypertensive chronic kidney disease with stage 1 through stage 4 chronic kidney disease, or unspecified chronic kidney disease: Secondary | ICD-10-CM | POA: Diagnosis not present

## 2020-08-08 DIAGNOSIS — C7989 Secondary malignant neoplasm of other specified sites: Secondary | ICD-10-CM | POA: Diagnosis not present

## 2020-08-08 DIAGNOSIS — D649 Anemia, unspecified: Secondary | ICD-10-CM | POA: Diagnosis not present

## 2020-08-08 DIAGNOSIS — G250 Essential tremor: Secondary | ICD-10-CM | POA: Diagnosis not present

## 2020-08-08 DIAGNOSIS — C187 Malignant neoplasm of sigmoid colon: Secondary | ICD-10-CM

## 2020-08-08 DIAGNOSIS — F32A Depression, unspecified: Secondary | ICD-10-CM | POA: Diagnosis not present

## 2020-08-08 DIAGNOSIS — D72819 Decreased white blood cell count, unspecified: Secondary | ICD-10-CM | POA: Insufficient documentation

## 2020-08-08 DIAGNOSIS — E785 Hyperlipidemia, unspecified: Secondary | ICD-10-CM | POA: Diagnosis not present

## 2020-08-08 DIAGNOSIS — E1122 Type 2 diabetes mellitus with diabetic chronic kidney disease: Secondary | ICD-10-CM | POA: Diagnosis not present

## 2020-08-08 DIAGNOSIS — Z86718 Personal history of other venous thrombosis and embolism: Secondary | ICD-10-CM | POA: Diagnosis not present

## 2020-08-08 DIAGNOSIS — Z79899 Other long term (current) drug therapy: Secondary | ICD-10-CM | POA: Diagnosis not present

## 2020-08-08 LAB — CMP (CANCER CENTER ONLY)
ALT: 10 U/L (ref 0–44)
AST: 20 U/L (ref 15–41)
Albumin: 3.1 g/dL — ABNORMAL LOW (ref 3.5–5.0)
Alkaline Phosphatase: 95 U/L (ref 38–126)
Anion gap: 6 (ref 5–15)
BUN: 25 mg/dL — ABNORMAL HIGH (ref 8–23)
CO2: 27 mmol/L (ref 22–32)
Calcium: 9.2 mg/dL (ref 8.9–10.3)
Chloride: 108 mmol/L (ref 98–111)
Creatinine: 0.88 mg/dL (ref 0.44–1.00)
GFR, Estimated: >60 mL/min (ref >60)
Glucose, Bld: 95 mg/dL (ref 70–99)
Potassium: 4.5 mmol/L (ref 3.5–5.1)
Sodium: 141 mmol/L (ref 135–145)
Total Bilirubin: 0.6 mg/dL (ref 0.3–1.2)
Total Protein: 6.7 g/dL (ref 6.5–8.1)

## 2020-08-08 LAB — CBC WITH DIFFERENTIAL (CANCER CENTER ONLY)
Abs Immature Granulocytes: 0 10*3/uL (ref 0.00–0.07)
Basophils Absolute: 0 10*3/uL (ref 0.0–0.1)
Basophils Relative: 1 %
Eosinophils Absolute: 0.1 10*3/uL (ref 0.0–0.5)
Eosinophils Relative: 4 %
HCT: 31.4 % — ABNORMAL LOW (ref 36.0–46.0)
Hemoglobin: 9.2 g/dL — ABNORMAL LOW (ref 12.0–15.0)
Immature Granulocytes: 0 %
Lymphocytes Relative: 21 %
Lymphs Abs: 0.8 10*3/uL (ref 0.7–4.0)
MCH: 27.5 pg (ref 26.0–34.0)
MCHC: 29.3 g/dL — ABNORMAL LOW (ref 30.0–36.0)
MCV: 94 fL (ref 80.0–100.0)
Monocytes Absolute: 0.4 10*3/uL (ref 0.1–1.0)
Monocytes Relative: 11 %
Neutro Abs: 2.3 10*3/uL (ref 1.7–7.7)
Neutrophils Relative %: 63 %
Platelet Count: 125 10*3/uL — ABNORMAL LOW (ref 150–400)
RBC: 3.34 MIL/uL — ABNORMAL LOW (ref 3.87–5.11)
RDW: 17.1 % — ABNORMAL HIGH (ref 11.5–15.5)
WBC Count: 3.7 10*3/uL — ABNORMAL LOW (ref 4.0–10.5)
nRBC: 0 % (ref 0.0–0.2)

## 2020-08-08 LAB — FERRITIN: Ferritin: 12 ng/mL (ref 11–307)

## 2020-08-08 NOTE — Progress Notes (Addendum)
[place] OFFICE PROGRESS NOTE   Diagnosis: Colon cancer  INTERVAL HISTORY:   Ms. Grahn returns as scheduled.  She is accompanied by her sister to today's appointment.  She continues to reside at a nursing facility.  She is working with physical therapy.  She and her sister agree she is stronger.  Balance continues to be an issue.  Bowels overall moving regularly.  She has occasional diarrhea and periodic incontinence.  Left leg/hip pain have improved.  She typically takes Norco twice a day, morning and night, Tylenol during the day if needed.  She reports a good appetite.  She notes blood on the toilet tissue intermittently.  Stool is a green color at times.  No vaginal bleeding.  No hematuria.  Objective:  Vital signs in last 24 hours:  Blood pressure 124/60, pulse 70, temperature 97.9 F (36.6 C), temperature source Oral, resp. rate 20, height _0  (1.575 m), weight 227 lb 6.4 oz (103.1 kg), SpO2 99 %.    HEENT: Neck without mass. Lymphatics: No palpable cervical, supraclavicular, axillary or inguinal lymph nodes. Resp: Lungs clear bilaterally. Cardio: Regular rate and rhythm. GI: Abdomen soft and nontender.  No hepatomegaly. Vascular: Firm lower leg edema bilaterally/chronic stasis change.  Skin: Erythematous rash bilateral groin.   Lab Results:  Lab Results  Component Value Date   WBC 3.7 (L) 08/08/2020   HGB 9.2 (L) 08/08/2020   HCT 31.4 (L) 08/08/2020   MCV 94.0 08/08/2020   PLT 125 (L) 08/08/2020   NEUTROABS 2.3 08/08/2020    Imaging:  No results found.  Medications: I have reviewed the patient's current medications.  Assessment/Plan: 1.Metastatic colon cancer,pT3, pN0 adenocarcinoma of the sigmoid colon diagnosed April 2021, status post sigmoidectomy 08/14/2019- MSI Stable  Left sacral mass consistent with colorectalprimary -MRI of the thoracic and lumbar spine 03/24/2020 -abnormal T2/STIR hyperintense lesion involving the central and  left aspect of the sacrum concerning for osseous metastatic disease, probable invasion of adjacent left S1-S2 neural foramina, extraosseous extension into the adjacent presacral space -CT chest/abdomen/pelvis 03/25/2020-destructive left sacral lesion, associated 2.8 x 3.5 cm soft tissue component anterior to the left sacrum worrisome for metastasis, 2.6 cm hypoenhancing mass in the right upper kidney-? Metastasis versus renal cell carcinoma -CT-guided biopsy of left sacral mass 03/28/2020-metastatic adenocarcinoma consistent with GI primary, MSS, tumor mutation burden 4, IDH 1, K-ras wild-type -Radiation to left sacral mass 04/01/2020-04/14/2020 -CTs 08/05/2020-similar size and appearance of left presacral soft tissue metastasis and lytic lesion within the left sacrum.  No progressive metastatic disease identified.  Previously identified indeterminate lesion in the interpolar region of the right kidney not well seen but has not clearly resolved.  No bladder wall thickening.  Mildly increased size of small mediastinal lymph nodes.  Small retroperitoneal pelvic lymph nodes unchanged.  Stable additional incidental findings including morphologic changes of cirrhosis, splenomegaly, cholelithiasis and aortic atherosclerosis.  Heterogeneous enlargement of the thyroid gland with multiple calcifications unchanged from previous CT examinations. 2.History of iron deficiency anemia 3. Cirrhosis with splenomegaly 5. Left lower extremity DVT diagnosed 2019 6. Hypertension 7. Diabetes mellitus 8. Benign essential tremor 9. CKD 10. Hyperlipidemia 11. Morbid obesity 12. Anxiety and depression 13. 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  Pain secondary to #1, improved following palliative radiation 16.  Right renal mass on CT 03/25/2020; previously identified indeterminate lesion in the interpolar region of the right kidney not well seen but not clearly resolved on CT  08/05/2020. 17.  Anemia-progressive 08/08/2020.  Ferritin added to labs.  Made request to facility to complete stool Hemoccult cards x3 and repeat CBC in 2 weeks. 18.  Mild leukopenia and thrombocytopenia-question related to cirrhosis  Disposition: Ms. Galvis appears stable.  She has metastatic colon cancer.  Recent restaging CT scans show no evidence of progressive/new metastatic disease.  Plan to continue observation.  She has progressive anemia.  She appears asymptomatic.  We are adding a ferritin to today's labs.  Request facility complete stool Hemoccult cards x3 and repeat a CBC in 2 weeks.  We will repeat a CBC here in 4 weeks.  Sign/symptoms suggestive of progressive anemia reviewed with her and her sister at today's visit.  She understands to contact the office should she develop any of these.  She will return for lab and follow-up in 4 weeks.  She will contact the office in the interim as outlined above or with any other problems.  Patient seen with Dr. Benay Spice.    Ned Card ANP/GNP-BC   08/08/2020  1:39 PM  This was a shared visit with Ned Card.  Ms. Durango appears unchanged.  The restaging CT shows no evidence of disease progression.  We reviewed the CT images with Ms. Megan Salon.  She has developed progressive anemia.  She denies bleeding.  We performed additional evaluation today and we will arrange for a repeat CBC in 2 weeks at the skilled nursing facility.  She will return for an office visit in 4 weeks.  I was present for the 50% of today's visit.  I performed medical decision making.  Julieanne Manson, MD

## 2020-08-08 NOTE — Telephone Encounter (Signed)
Scheduled appt per 4/11 los - gave patient AVS and calender per los.

## 2020-08-09 LAB — CEA (IN HOUSE-CHCC): CEA (CHCC-In House): 19.86 ng/mL — ABNORMAL HIGH (ref 0.00–5.00)

## 2020-08-10 ENCOUNTER — Telehealth: Payer: Self-pay

## 2020-08-10 NOTE — Telephone Encounter (Signed)
-----   Message from Owens Shark, NP sent at 08/10/2020  1:08 PM EDT ----- Please let her know ferritin is low.  Dr. Benay Spice recommends IV iron (Feraheme weekly x2).  Main side effect with IV iron would be an allergic reaction.  If she agrees with IV iron please get scheduled in the next 1 to 2 weeks.  Thanks

## 2020-08-10 NOTE — Telephone Encounter (Signed)
Called pt to review most recent labs and recommendations for IV iron no answer message left to return call  Currently awaiting return call

## 2020-08-23 ENCOUNTER — Telehealth: Payer: Self-pay

## 2020-08-23 NOTE — Telephone Encounter (Signed)
Patient returned call with several requests. 1. She would be interest in IV iron however would like to wait until after her may appointments 2. The appointments scheduled on may 9 she would like to reschedule for the following week as she has several things going on at her facility she would like to be a aprt of  Pt states she does not feels any different and would feel better repeating her labs and moving forward from there

## 2020-08-23 NOTE — Telephone Encounter (Signed)
Called home and cell left message to follow uo concerning IV iron  Awaiting pt return call

## 2020-08-26 ENCOUNTER — Emergency Department (HOSPITAL_COMMUNITY): Payer: Medicare HMO

## 2020-08-26 ENCOUNTER — Other Ambulatory Visit: Payer: Self-pay

## 2020-08-26 ENCOUNTER — Encounter (HOSPITAL_COMMUNITY): Payer: Self-pay

## 2020-08-26 ENCOUNTER — Emergency Department (HOSPITAL_COMMUNITY)
Admission: EM | Admit: 2020-08-26 | Discharge: 2020-08-27 | Disposition: A | Discharge status: Skilled Nursing Facility | Payer: Medicare HMO | Attending: Emergency Medicine | Admitting: Emergency Medicine

## 2020-08-26 DIAGNOSIS — G510 Bell's palsy: Secondary | ICD-10-CM

## 2020-08-26 DIAGNOSIS — F17210 Nicotine dependence, cigarettes, uncomplicated: Secondary | ICD-10-CM | POA: Diagnosis not present

## 2020-08-26 DIAGNOSIS — Z20822 Contact with and (suspected) exposure to covid-19: Secondary | ICD-10-CM | POA: Insufficient documentation

## 2020-08-26 DIAGNOSIS — Z7901 Long term (current) use of anticoagulants: Secondary | ICD-10-CM | POA: Insufficient documentation

## 2020-08-26 DIAGNOSIS — I10 Essential (primary) hypertension: Secondary | ICD-10-CM | POA: Diagnosis not present

## 2020-08-26 DIAGNOSIS — Z85038 Personal history of other malignant neoplasm of large intestine: Secondary | ICD-10-CM | POA: Diagnosis not present

## 2020-08-26 DIAGNOSIS — Z794 Long term (current) use of insulin: Secondary | ICD-10-CM | POA: Diagnosis not present

## 2020-08-26 DIAGNOSIS — Z79899 Other long term (current) drug therapy: Secondary | ICD-10-CM | POA: Insufficient documentation

## 2020-08-26 DIAGNOSIS — E1140 Type 2 diabetes mellitus with diabetic neuropathy, unspecified: Secondary | ICD-10-CM | POA: Insufficient documentation

## 2020-08-26 DIAGNOSIS — N390 Urinary tract infection, site not specified: Secondary | ICD-10-CM

## 2020-08-26 DIAGNOSIS — R4182 Altered mental status, unspecified: Secondary | ICD-10-CM | POA: Diagnosis present

## 2020-08-26 LAB — COMPREHENSIVE METABOLIC PANEL
ALT: 15 U/L (ref 0–44)
AST: 29 U/L (ref 15–41)
Albumin: 3.1 g/dL — ABNORMAL LOW (ref 3.5–5.0)
Alkaline Phosphatase: 118 U/L (ref 38–126)
Anion gap: 8 (ref 5–15)
BUN: 21 mg/dL (ref 8–23)
CO2: 26 mmol/L (ref 22–32)
Calcium: 9.3 mg/dL (ref 8.9–10.3)
Chloride: 107 mmol/L (ref 98–111)
Creatinine, Ser: 0.82 mg/dL (ref 0.44–1.00)
GFR, Estimated: >60 mL/min (ref >60)
Glucose, Bld: 102 mg/dL — ABNORMAL HIGH (ref 70–99)
Potassium: 4 mmol/L (ref 3.5–5.1)
Sodium: 141 mmol/L (ref 135–145)
Total Bilirubin: 1 mg/dL (ref 0.3–1.2)
Total Protein: 7.1 g/dL (ref 6.5–8.1)

## 2020-08-26 LAB — PROTIME-INR
INR: 1.3 — ABNORMAL HIGH (ref 0.8–1.2)
Prothrombin Time: 16.6 seconds — ABNORMAL HIGH (ref 11.4–15.2)

## 2020-08-26 LAB — RESP PANEL BY RT-PCR (FLU A&B, COVID) ARPGX2
Influenza A by PCR: NEGATIVE
Influenza B by PCR: NEGATIVE
SARS Coronavirus 2 by RT PCR: NEGATIVE

## 2020-08-26 LAB — CBC WITH DIFFERENTIAL/PLATELET
Abs Immature Granulocytes: 0.01 10*3/uL (ref 0.00–0.07)
Basophils Absolute: 0 10*3/uL (ref 0.0–0.1)
Basophils Relative: 1 %
Eosinophils Absolute: 0.1 10*3/uL (ref 0.0–0.5)
Eosinophils Relative: 3 %
HCT: 34.1 % — ABNORMAL LOW (ref 36.0–46.0)
Hemoglobin: 10.2 g/dL — ABNORMAL LOW (ref 12.0–15.0)
Immature Granulocytes: 0 %
Lymphocytes Relative: 20 %
Lymphs Abs: 1 10*3/uL (ref 0.7–4.0)
MCH: 30.3 pg (ref 26.0–34.0)
MCHC: 29.9 g/dL — ABNORMAL LOW (ref 30.0–36.0)
MCV: 101.2 fL — ABNORMAL HIGH (ref 80.0–100.0)
Monocytes Absolute: 0.6 10*3/uL (ref 0.1–1.0)
Monocytes Relative: 12 %
Neutro Abs: 3.2 10*3/uL (ref 1.7–7.7)
Neutrophils Relative %: 64 %
Platelets: 155 10*3/uL (ref 150–400)
RBC: 3.37 MIL/uL — ABNORMAL LOW (ref 3.87–5.11)
RDW: 22.9 % — ABNORMAL HIGH (ref 11.5–15.5)
WBC: 5 10*3/uL (ref 4.0–10.5)
nRBC: 0 % (ref 0.0–0.2)

## 2020-08-26 LAB — URINALYSIS, ROUTINE W REFLEX MICROSCOPIC
Bilirubin Urine: NEGATIVE
Glucose, UA: NEGATIVE mg/dL
Ketones, ur: NEGATIVE mg/dL
Nitrite: POSITIVE — AB
Protein, ur: 30 mg/dL — AB
Specific Gravity, Urine: 1.009 (ref 1.005–1.030)
pH: 7 (ref 5.0–8.0)

## 2020-08-26 LAB — APTT: aPTT: 35 seconds (ref 24–36)

## 2020-08-26 IMAGING — MR MR HEAD W/O CM
9 of 10 series · 40 of 48 positions shown · non-contrast
Comparison: Head CT [DATE]

CLINICAL DATA: Mental status changes.  Acute stroke.

EXAM:
MRI HEAD WITHOUT CONTRAST
TECHNIQUE: Multiplanar, multiecho pulse sequences of the brain and surrounding
structures were obtained without intravenous contrast.

[Series 5: dwi_tracew · axial · 3.0mm · 1.08mm/px · z∈[-46,+79]mm · 7 of 102 slices shown]
[im 1/102]
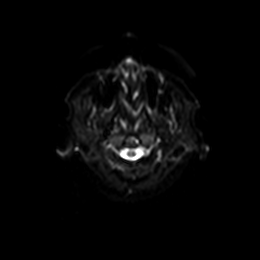
[im 12/102]
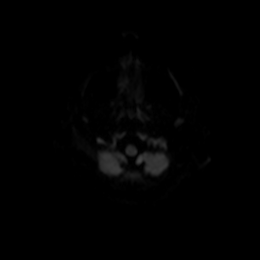
[im 34/102]
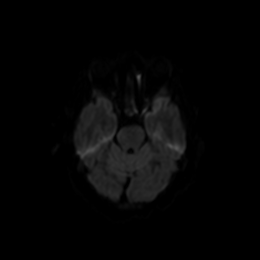
[im 45/102]
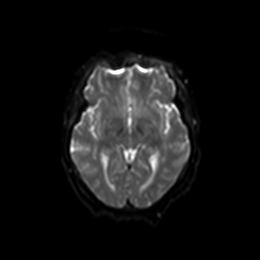
[im 57/102]
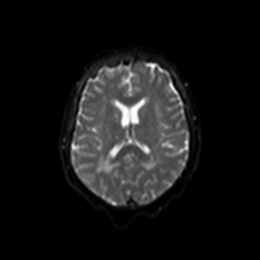
[im 68/102]
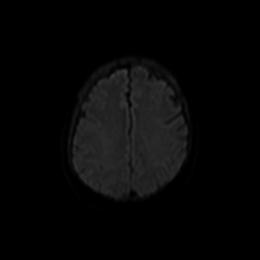
[im 90/102]
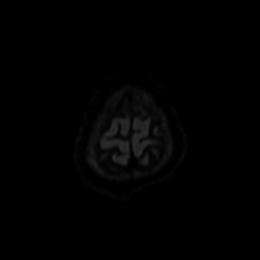

[Series 7: T2 · sagittal · 5.0mm · 0.47mm/px · 3 of 25 slices shown (1 of 3)]
[im 1/25]
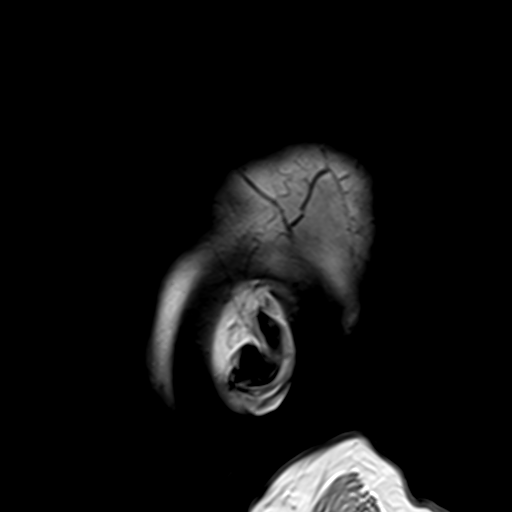
[im 13/25]
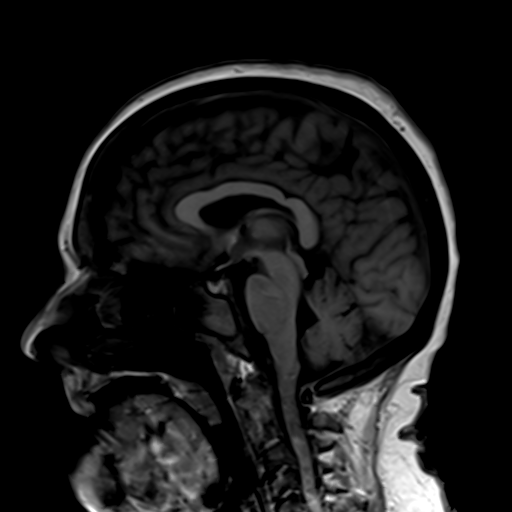
[im 25/25]
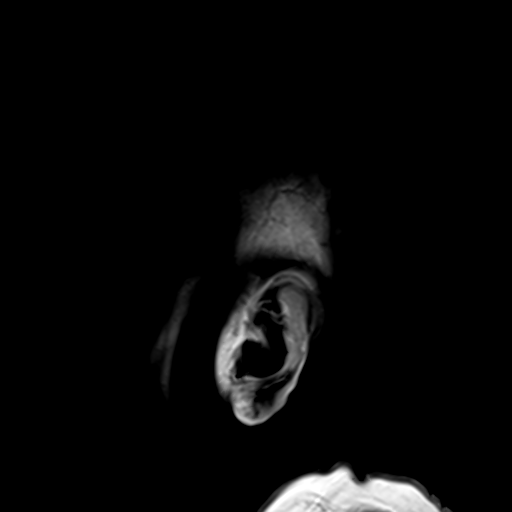

[Series 8: T2 · axial · 5.0mm · 0.45mm/px · z∈[-61,+89]mm · 3 of 25 slices shown (2 of 3)]
[im 1/25]
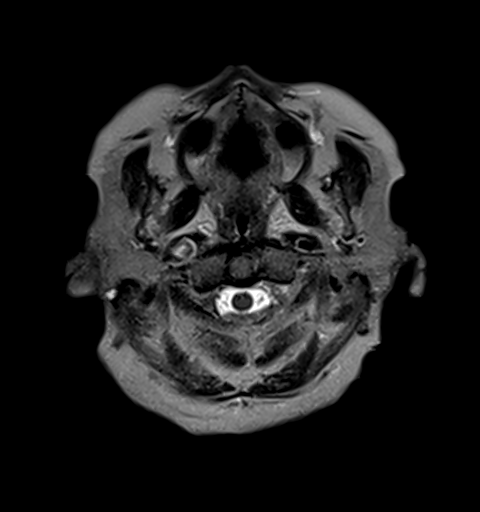
[im 13/25]
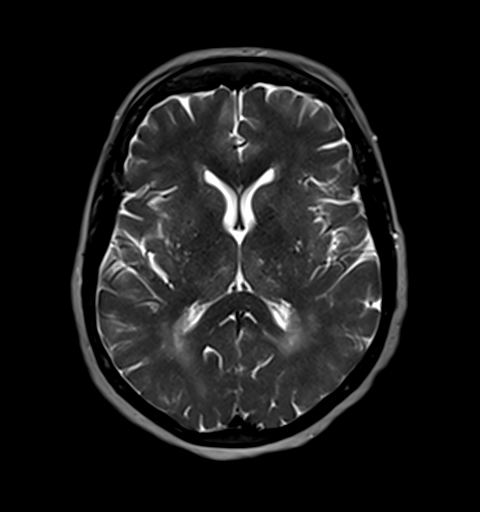
[im 25/25]
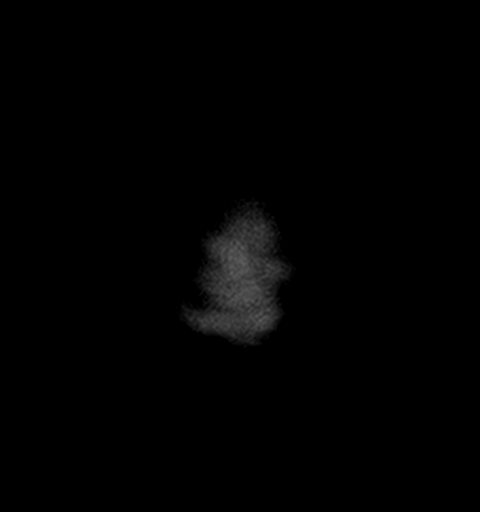

[Series 9: GRE · axial · 3.0mm · 0.45mm/px · z∈[-50,+92]mm · 5 of 51 slices shown]
[im 1/51]
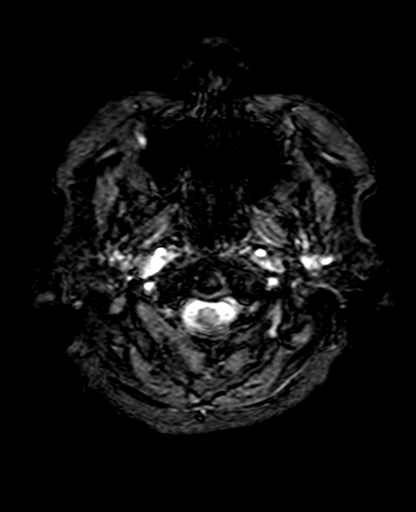
[im 13/51]
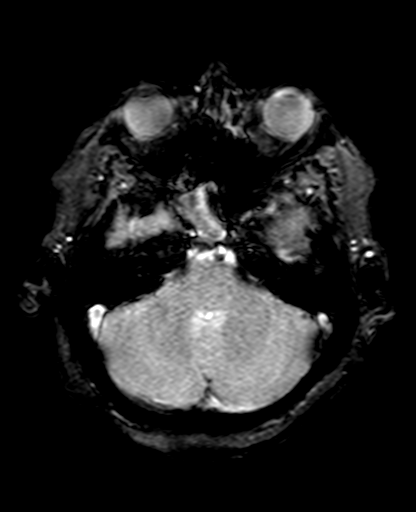
[im 26/51]
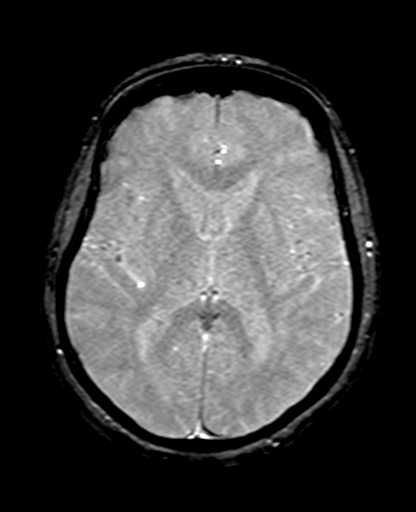
[im 38/51]
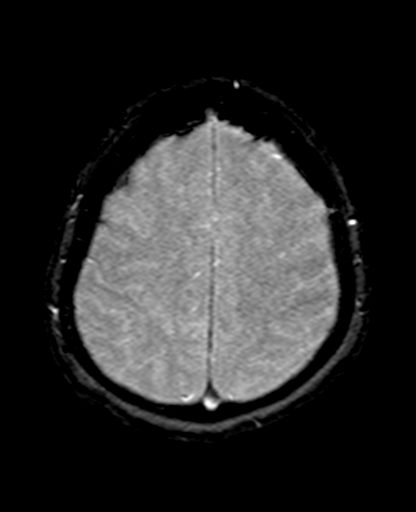
[im 51/51]
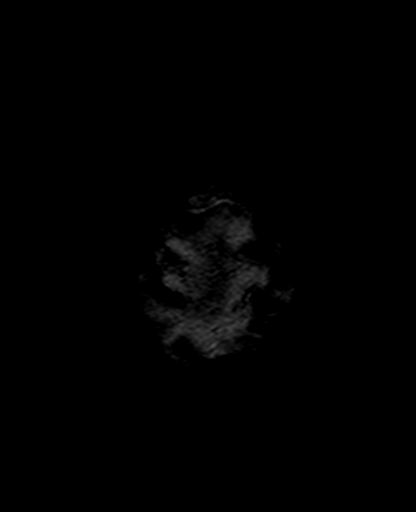

[Series 10: FLAIR · axial · 3.0mm · 0.86mm/px · z∈[-58,+86]mm · 5 of 51 slices shown]
[im 1/51]
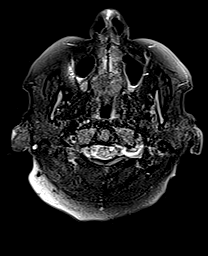
[im 13/51]
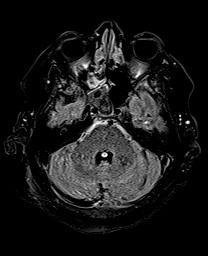
[im 26/51]
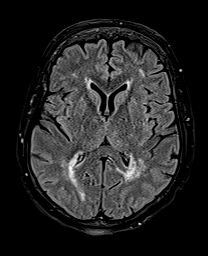
[im 38/51]
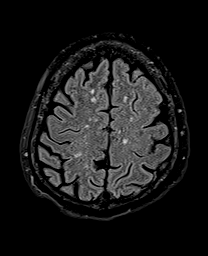
[im 51/51]
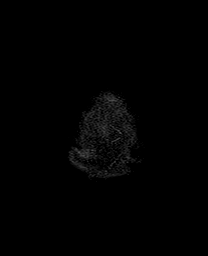

[Series 11: T1 · axial · 3.0mm · 0.45mm/px · z∈[-50,+92]mm · 5 of 51 slices shown]
[im 1/51]
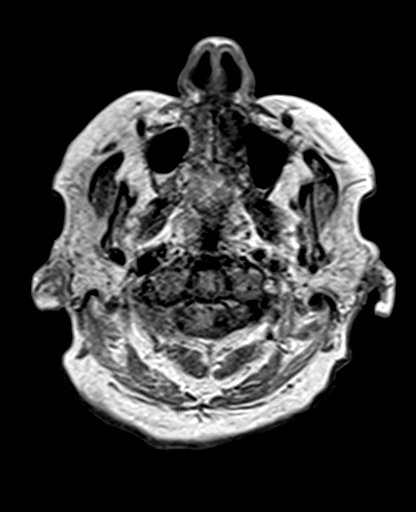
[im 13/51]
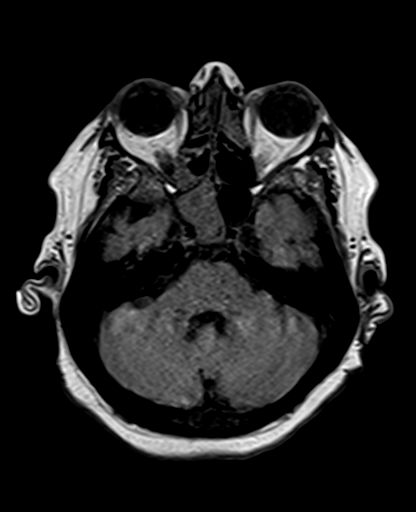
[im 26/51]
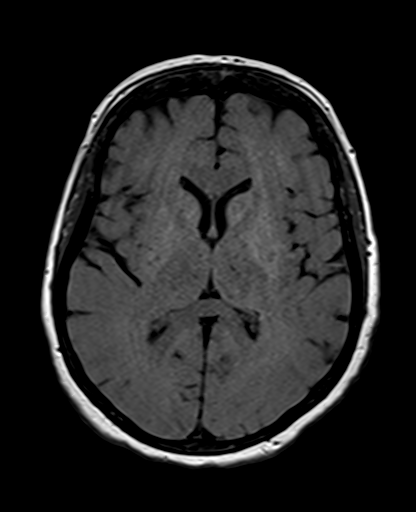
[im 38/51]
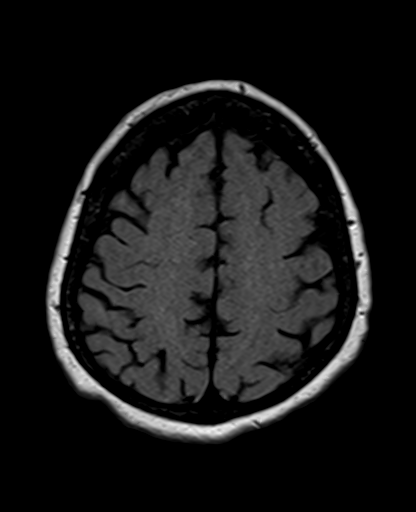
[im 51/51]
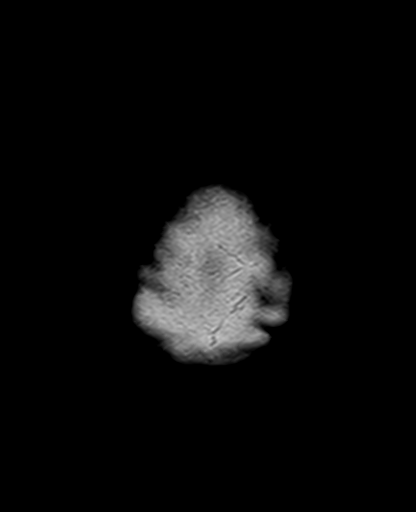

[Series 12: DWI · coronal · 5.0mm · 1.31mm/px · 6 of 60 slices shown (1 of 2)]
[im 1/60]
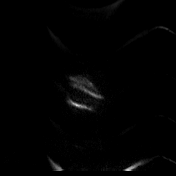
[im 12/60]
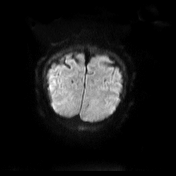
[im 24/60]
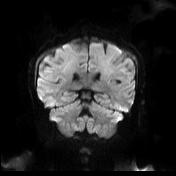
[im 36/60]
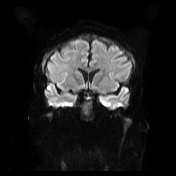
[im 48/60]
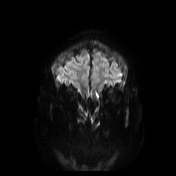
[im 60/60]
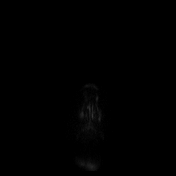

[Series 13: DWI · coronal · 5.0mm · 1.31mm/px · 3 of 30 slices shown (2 of 2)]
[im 1/30]
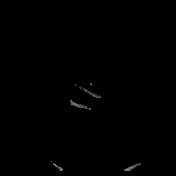
[im 15/30]
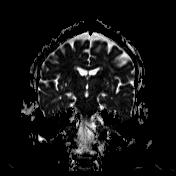
[im 30/30]
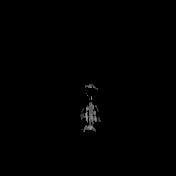

[Series 14: T2 · coronal · 5.0mm · 0.86mm/px · 3 of 29 slices shown (3 of 3)]
[im 1/29]
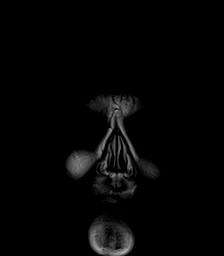
[im 15/29]
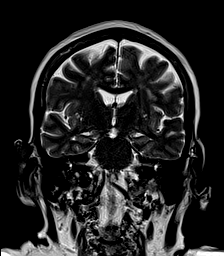
[im 29/29]
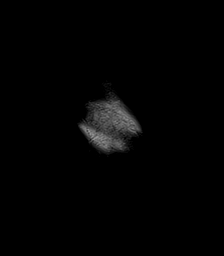

[40 of 48 positions shown; findings below may reference images not displayed]

FINDINGS: Brain: Diffusion imaging does not show any acute or subacute
infarction. No focal abnormality affects the brainstem or
cerebellum. Cerebral hemispheres show old small vessel infarctions
of the thalami and hemispheric white matter. No cortical or large
vessel territory infarction. No mass lesion, hemorrhage,
hydrocephalus or extra-axial collection.

Vascular: Major vessels at the base of the brain show flow.

Skull and upper cervical spine: Negative

Sinuses/Orbits: Opacification of the right division of the sphenoid
sinus consistent with sinusitis. Low T2 signal could indicate fungal
sinusitis. Other sinuses are clear. Orbits negative.

Other: None
IMPRESSION: 1. No acute intracranial finding. Chronic small-vessel ischemic
changes of the thalami and cerebral hemispheric white matter.
2. Right sphenoid sinusitis. Low T2 signal could indicate fungal
sinusitis.

## 2020-08-26 MED ORDER — SODIUM CHLORIDE 0.9 % IV SOLN
1.0000 g | Freq: Once | INTRAVENOUS | Status: AC
  Administered 2020-08-26: 1 g via INTRAVENOUS
  Filled 2020-08-26: qty 10

## 2020-08-26 MED ORDER — ACETAMINOPHEN 325 MG PO TABS
650.0000 mg | ORAL_TABLET | Freq: Once | ORAL | Status: AC
  Administered 2020-08-26: 650 mg via ORAL
  Filled 2020-08-26: qty 2

## 2020-08-26 MED ORDER — CEPHALEXIN 500 MG PO CAPS: 500.0000 mg | ORAL_CAPSULE | Freq: Three times a day (TID) | ORAL | 0 refills | Status: DC

## 2020-08-26 NOTE — ED Notes (Signed)
report called to [place] and spoke with Addey RN, calling [facility] now

## 2020-08-26 NOTE — Discharge Instructions (Signed)
You are seen in the ER for confusion. MRI of your brain did not reveal any stroke.  Your urine does show signs of infection.  Given that in the past you have had UTI that has led to confusion, our suspicion is that your symptoms today were because of UTI again.  Please take the antibiotics prescribed.  Please have the nursing facility complete neurochecks every 6 hours for the next 5 to 7 days.  If there is any confusion, one-sided weakness, numbness, slurred speech -return to the ER immediately.

## 2020-08-26 NOTE — ED Notes (Signed)
Patient transported to MRI 

## 2020-08-26 NOTE — ED Notes (Signed)
Pt placed on purewick 

## 2020-08-26 NOTE — ED Provider Notes (Signed)
[facility] DEPT Provider Note   CSN: [id number] Arrival date & time: 08/26/20  1426     History Chief Complaint  Patient presents with  . Urinary Tract Infection  . Altered Mental Status    Tonya Dougherty is a 70 y.o. female.  HPI    70 year old female comes in a chief complaint of altered mental status, questionable UTI.  I called [facility] where patient resides at an independent living facility.  According to them, patient is normally quite active, alert and helpful.  She was last seen normal last night.  This morning patient was found to be slow to respond with any kind of questions, she was disoriented to year and she was also incontinent.  They also had noted that patient had difficulty swallowing from her straw -and when their provider saw her they were concerned that patient had a stroke.   Patient reports that she does think that she was slow to respond than usual and not as sharp.  She denies any focal numbness, weakness, headaches, neck pain, vision change.  She is unsure if her speech was slurred.  She has known history of Bell's palsy.  She has had UTI in the past that has led to confusion.  She denies any burning with urination at this time.  Past Medical History:  Diagnosis Date  . Allergic rhinitis   . Anxiety   . Arthritis   . Bell's palsy   . Cataract   . Cirrhosis ([place])   . Coarse tremors   . Colon cancer ([place])   . Diabetes mellitus without complication ([place])   . Diverticulosis   . DVT (deep venous thrombosis) ([place])   . GERD (gastroesophageal reflux disease)   . GI bleed   . Hepatitis B   . History of blood transfusion 05/2019  . History of panic attacks   . Hypertension   . Insomnia   . Iron deficiency anemia   . MDD (major depressive disorder)   . Morbid obesity ([place])   . Neuropathy   . Portal vein thrombosis   . Psoriasis   . Renal insufficiency     Patient Active Problem List   Diagnosis Date  Noted  . Constipation 04/04/2020  . Iron deficiency anemia 04/04/2020  . Sacral lesion 03/26/2020  . Bone metastasis ([place]) 03/25/2020  . Acute cystitis without hematuria 03/25/2020  . Essential hypertension 03/25/2020  . GERD without esophagitis 03/25/2020  . Mixed diabetic hyperlipidemia associated with type 2 diabetes mellitus ([place]) 03/25/2020  . Nicotine dependence, cigarettes, uncomplicated [id number]/8101  . History of DVT (deep vein thrombosis) 03/25/2020  . Type 2 diabetes mellitus with hyperglycemia, with long-term current use of insulin ([place]) 03/25/2020  . Pressure injury of skin 03/25/2020  . Adenocarcinoma of sigmoid colon ([place]) 08/14/2019  . DVT (deep venous thrombosis) ([place]) 10/22/2017    Past Surgical History:  Procedure Laterality Date  . ANKLE SURGERY Right   . CATARACT EXTRACTION, BILATERAL    . COLONOSCOPY WITH ESOPHAGOGASTRODUODENOSCOPY (EGD)  05/2019  . FLEXIBLE SIGMOIDOSCOPY N/A 08/14/2019   Procedure: FLEXIBLE SIGMOIDOSCOPY;  Surgeon: Ileana Roup, MD;  Location: [facility];  Service: General;  Laterality: N/A;     OB History   No obstetric history on file.     Family History  Problem Relation Age of Onset  . Kidney failure Mother   . Diabetes Mother   . Obesity Mother   . Hypertension Mother   . Cirrhosis Father   . Hypertension  Sister   . Kidney failure Brother   . Hypertension Brother   . Hypertension Sister   . Alcoholism Brother   . Other Brother        MRSA  . Cirrhosis Daughter   . Drug abuse Daughter     Social History   Tobacco Use  . Smoking status: Current Every Day Smoker    Packs/day: 0.50    Types: Cigarettes  . Smokeless tobacco: Never Used  Vaping Use  . Vaping Use: Some days  . Substances: Nicotine, Flavoring  Substance Use Topics  . Alcohol use: No  . Drug use: Not Currently    Home Medications Prior to Admission medications   Medication Sig Start Date End Date Taking? Authorizing Provider  acetaminophen  (TYLENOL) 325 MG tablet Take 650 mg by mouth See admin instructions. 650mg  twice daily And 325mg  three times daily as needed for pain   Yes [provider]  albuterol (VENTOLIN HFA) 108 (90 Base) MCG/ACT inhaler Inhale 2 puffs into the lungs See admin instructions. 2 puffs daily And 2 puffs daily as needed for shortness of breath 07/30/18 08/26/20 Yes [provider]  Azelastine HCl 137 MCG/SPRAY SOLN Place 1 spray into both nostrils daily. 08/24/20  Yes [provider]  Calcium Carb-Cholecalciferol (CALCIUM 600+D) 600-800 MG-UNIT TABS Take 2 tablets by mouth at bedtime.   Yes [provider]  carboxymethylcellulose (REFRESH PLUS) 0.5 % SOLN Place 1 drop into both eyes 3 (three) times daily as needed (dry/irritated eyes).   Yes [provider]  carvedilol (COREG) 3.125 MG tablet Take 3.125 mg by mouth 2 (two) times daily with a meal.   Yes [provider]  cephALEXin (KEFLEX) 500 MG capsule Take 1 capsule (500 mg total) by mouth 3 (three) times daily. 08/26/20  Yes Varney Biles, MD  citalopram (CELEXA) 10 MG tablet Take 30 mg by mouth at bedtime.    Yes [provider]  diphenhydrAMINE (BENADRYL) 25 mg capsule Take 1 capsule (25 mg total) by mouth every 6 (six) hours as needed for itching. 04/14/20  Yes Aline August, MD  ELIQUIS 5 MG TABS tablet TAKE 1 TABLET BY MOUTH TWICE A DAY Patient taking differently: Take 5 mg by mouth 2 (two) times daily. 03/10/20  Yes Waynetta Sandy, MD  ferrous gluconate (FERGON) 324 MG tablet Take 324 mg by mouth 3 (three) times daily.   Yes [provider]  furosemide (LASIX) 20 MG tablet Take 1 tablet (20 mg total) by mouth daily. Patient taking differently: Take 10 mg by mouth daily. 04/14/20  Yes Aline August, MD  gabapentin (NEURONTIN) 300 MG capsule Take 2 capsules (600 mg total) by mouth 3 (three) times daily. 04/14/20  Yes Aline August, MD  HYDROcodone-acetaminophen  (NORCO/VICODIN) 5-325 MG tablet Take 0.5 tablets by mouth daily as needed for severe pain. 05/23/20  Yes [provider]  hydrOXYzine (ATARAX/VISTARIL) 10 MG tablet Take 10 mg by mouth 2 (two) times daily as needed for itching. 07/25/20  Yes [provider]  Insulin Glargine (BASAGLAR KWIKPEN) 100 UNIT/ML SOPN Inject 32 Units into the skin at bedtime. 10/11/17  Yes [provider]  NOVOLOG 100 UNIT/ML injection Inject 0-8 Units into the skin daily as needed for other. Check daily at 0730am 0-200 0 units 201-250 2 units 251-300 4 units 301-350 6 units 351-400 8 units > 400 call MD 07/25/20  Yes [provider]  omeprazole (PRILOSEC) 20 MG capsule Take 20 mg by mouth at bedtime.  Yes [provider]  polyethylene glycol powder (GLYCOLAX/MIRALAX) 17 GM/SCOOP powder Take 17 g by mouth in the morning and at bedtime. Patient taking differently: Take 17 g by mouth daily. 04/14/20  Yes Aline August, MD  senna-docusate (SENOKOT-S) 8.6-50 MG tablet Take 1 tablet by mouth 2 (two) times daily as needed for moderate constipation. Patient taking differently: Take 1 tablet by mouth See admin instructions. 1 tablet daily And 1 tablet twice daily as needed for moderate constipation 04/14/20  Yes Aline August, MD  sodium chloride (OCEAN) 0.65 % SOLN nasal spray Place 2 sprays into both nostrils daily.   Yes [provider]  B-D ULTRAFINE III SHORT PEN 31G X 8 MM MISC 1 each by Other route daily. For lantus 10/24/17   [provider]    Allergies    Xarelto [rivaroxaban], Erythromycin, Lipitor [atorvastatin], Pravachol [pravastatin], and Ivp dye [iodinated diagnostic agents]  Review of Systems   Review of Systems  Constitutional: Positive for activity change.  Eyes: Negative for photophobia and visual disturbance.  Respiratory: Negative for shortness of breath.   Cardiovascular: Negative for chest pain.  Gastrointestinal: Negative for abdominal pain,  nausea and vomiting.  Neurological: Positive for speech difficulty. Negative for dizziness, seizures, weakness and headaches.  Psychiatric/Behavioral: Positive for confusion.  All other systems reviewed and are negative.   Physical Exam Updated Vital Signs BP 117/66   Pulse 81   Temp 98 F (36.7 C) (Oral)   Resp 16   SpO2 92%   Physical Exam Vitals and nursing note reviewed.  Constitutional:      Appearance: She is well-developed.  HENT:     Head: Normocephalic and atraumatic.  Cardiovascular:     Rate and Rhythm: Normal rate.  Pulmonary:     Effort: Pulmonary effort is normal.  Abdominal:     General: Bowel sounds are normal.  Musculoskeletal:     Cervical back: Normal range of motion and neck supple.  Skin:    General: Skin is warm and dry.  Neurological:     Mental Status: She is alert and oriented to person, place, and time.     Comments: Left-sided nasolabial flattening, facial droop, speech is fluent, no gross sensory deficit over the face, upper or lower extremity and no hemiplegia over the extremities.  Peripheral visual fields are normal, EOMI.      ED Results / Procedures / Treatments   Labs (all labs ordered are listed, but only abnormal results are displayed) Labs Reviewed  COMPREHENSIVE METABOLIC PANEL - Abnormal; Notable for the following components:      Result Value   Glucose, Bld 102 (*)    Albumin 3.1 (*)    All other components within normal limits  CBC WITH DIFFERENTIAL/PLATELET - Abnormal; Notable for the following components:   RBC 3.37 (*)    Hemoglobin 10.2 (*)    HCT 34.1 (*)    MCV 101.2 (*)    MCHC 29.9 (*)    RDW 22.9 (*)    All other components within normal limits  URINALYSIS, ROUTINE W REFLEX MICROSCOPIC - Abnormal; Notable for the following components:   APPearance CLOUDY (*)    Hgb urine dipstick MODERATE (*)    Protein, ur 30 (*)    Nitrite POSITIVE (*)    Leukocytes,Ua MODERATE (*)    Bacteria, UA FEW (*)    All other  components within normal limits  PROTIME-INR - Abnormal; Notable for the following components:   Prothrombin Time 16.6 (*)  INR 1.3 (*)    All other components within normal limits  RESP PANEL BY RT-PCR (FLU A&B, COVID) ARPGX2  APTT    EKG EKG Interpretation  Date/Time:  Friday August 26 2020 16:50:30 EDT Ventricular Rate:  89 PR Interval:  149 QRS Duration: 90 QT Interval:  364 QTC Calculation: 443 R Axis:   75 Text Interpretation: Sinus rhythm No acute changes No significant change since last tracing Confirmed by Varney Biles [id number]) on 08/26/2020 5:27:21 PM   Radiology MR BRAIN WO CONTRAST  Result Date: 08/26/2020 CLINICAL DATA:  Mental status changes.  Acute stroke. EXAM: MRI HEAD WITHOUT CONTRAST TECHNIQUE: Multiplanar, multiecho pulse sequences of the brain and surrounding structures were obtained without intravenous contrast. COMPARISON:  Head CT 04/09/2007 FINDINGS: Brain: Diffusion imaging does not show any acute or subacute infarction. No focal abnormality affects the brainstem or cerebellum. Cerebral hemispheres show old small vessel infarctions of the thalami and hemispheric white matter. No cortical or large vessel territory infarction. No mass lesion, hemorrhage, hydrocephalus or extra-axial collection. Vascular: Major vessels at the base of the brain show flow. Skull and upper cervical spine: Negative Sinuses/Orbits: Opacification of the right division of the sphenoid sinus consistent with sinusitis. Low T2 signal could indicate fungal sinusitis. Other sinuses are clear. Orbits negative. Other: None IMPRESSION: 1. No acute intracranial finding. Chronic small-vessel ischemic changes of the thalami and cerebral hemispheric white matter. 2. Right sphenoid sinusitis. Low T2 signal could indicate fungal sinusitis. Electronically Signed   By: Nelson Chimes M.D.   On: 08/26/2020 17:52    Procedures Procedures   Medications Ordered in ED Medications  cefTRIAXone (ROCEPHIN) 1  g in sodium chloride 0.9 % 100 mL IVPB (0 g Intravenous Stopped 08/26/20 1846)  acetaminophen (TYLENOL) tablet 650 mg (650 mg Oral Given 08/26/20 1842)    ED Course  I have reviewed the triage vital signs and the nursing notes.  Pertinent labs & imaging results that were available during my care of the patient were reviewed by me and considered in my medical decision making (see chart for details).    MDM Rules/Calculators/A&P                          70 year old female comes in a chief complaint of confusion -described as disorientation and slow to respond.  She also was noted to have difficulty swallowing from her straw, which is unusual despite her history of Bell's palsy.  Allegedly she also had worse slurring of her speech than usual.  Currently patient states that she is feeling a lot better.  She does have left-sided facial droop.  She was disoriented, she thought it was in the 1990s and at first she was unable to identify the clock on the wall.  Questioning if patient has aphasia leading to slow response and being interpreted as confusion.  Questioning if there is delirium.  It is possible that her Bell's palsy was more pronounced leading to slurring and difficulty in swallowing.  The plan will be to get MRI of her brain to ensure there is no acute stroke.  If the MRI is negative then we will look for metabolic and infectious cause.  Reassessment: MRI is negative for acute stroke.  Patient's urine is nitrite positive with pyuria and bacteriuria.  We will start her on antibiotics.  She has had UTI in the past leading to encephalopathy.  It can also probably make her Bell's palsy more pronounced, that is our  working diagnosis at this time.  Results of the ER work-up discussed with the patient.  She feels a lot better herself and is noted to be more alert than I first saw her.  I do not think she had transient global amnesia or TIA.  I did discuss the case with neurology team on call.   Given the constellation of symptoms, they to think that this is probably not related to a a stroke/TIA.  From medication standpoint, patient is on anticoagulation and statins. We will discharge her on Keflex and advise close neurochecks.    Final Clinical Impression(s) / ED Diagnoses Final diagnoses:  Lower urinary tract infectious disease  Bell's palsy    Rx / DC Orders ED Discharge Orders         Ordered    cephALEXin (KEFLEX) 500 MG capsule  3 times daily        08/26/20 1928           Varney Biles, MD 08/26/20 1948

## 2020-08-26 NOTE — ED Triage Notes (Signed)
Coming from [place], altered mental status, possible UTI, has baseline right sided facial droop from bell's palsy, bone cancer in left hip and femur

## 2020-08-27 MED ORDER — ACETAMINOPHEN 325 MG PO TABS
650.0000 mg | ORAL_TABLET | Freq: Once | ORAL | Status: AC
  Administered 2020-08-27: 650 mg via ORAL
  Filled 2020-08-27: qty 2

## 2020-09-02 ENCOUNTER — Other Ambulatory Visit: Payer: Self-pay

## 2020-09-02 ENCOUNTER — Non-Acute Institutional Stay: Payer: Medicare HMO | Admitting: Hospice

## 2020-09-02 DIAGNOSIS — Z515 Encounter for palliative care: Secondary | ICD-10-CM

## 2020-09-02 DIAGNOSIS — N3000 Acute cystitis without hematuria: Secondary | ICD-10-CM

## 2020-09-02 DIAGNOSIS — F339 Major depressive disorder, recurrent, unspecified: Secondary | ICD-10-CM

## 2020-09-02 NOTE — Progress Notes (Signed)
[facility] Consult Note Telephone: [phone]  Fax: [phone]  PATIENT NAME: Tonya Dougherty DOB: [birth year] MRN: [id number]  PRIMARY CARE PROVIDER:   Gay Filler, MD Gay Filler, MD [address] [place],  [place] [address]  REFERRING PROVIDER:   Dr. Jules Husbands  RESPONSIBLE PARTY:   Extended Emergency Contact Information Primary Emergency Contact: Volney Presser [place] Phone: [phone] Work Phone: [phone] Relation: Friend Secondary Emergency Contact: Constance Haw Home Phone: [phone] Mobile Phone: [phone] Relation: Sister [place]    Name Relation Home Work Mobile   Milo Friend [phone]    Cherene Julian [phone]  [phone]      Visit is to build trust and highlight Palliative Medicine as specialized medical care for people living with serious illness, aimed at facilitating better quality of life through symptoms relief, assisting with advance care planning and complex medical decision making.   RECOMMENDATIONS/PLAN:   Advance Care Planning/Code Status: Patient is a full code  Goals of Care: Goals of care include to maximize quality of life and symptom management. MOST form Selections include attempt resuscitation CPR, full scope of treatment, antibiotics if indicated, IV fluids if indicated, feeding tube for defined trial.  Visit consisted of counseling and education dealing with the complex and emotionally intense issues of symptom management and palliative care in the setting of serious and potentially life-threatening illness. Palliative care team will continue to support patient, patient's family, and medical team.  Symptom management/Plan:  Urinary tract infection: Continue to completion - Keflex 500 mg 3 times daily x7 days.  Ensure adequate hydration. Routine CBC BMP Hypotension: Improved.  No report of palpitations, dizziness or syncope.  Tolerating carvedilol well at this time. Depression: Continue citalopram.  Psych consult as needed Follow up: Palliative care will continue to follow for complex medical decision making, advance care planning, and clarification of goals. Return 6 weeks or prn. Encouraged to call provider sooner with any concerns.  CHIEF COMPLAINT: Palliative follow up visit/urinary tract infection  HISTORY OF PRESENT ILLNESS:  Tonya Dougherty a 70 y.o. female with multiple medical problems including urinary tract infection, acute, new onset, with associated symptoms of altered mental status, impairing quality of life. Current antibiotic treatment regimen is helpful; mental status at baseline today. Patient with history of adenocarcinoma of sigmoid colon, major depressive disorder, hypotension, anxiety, COVID-19 infection.  History obtained from review of EMR, discussion with primary team, family, caregiver  and/or patient. Records reviewed and summarized above. All 10 point systems reviewed and are negative except as documented in history of present illness above  Review and summarization of Epic records shows history from other than patient.   Palliative Care was asked to follow this patient by consultation request of Dr. Jules Husbands to help address complex decision making in the context of advance care planning and goals of care clarification.   PPS: 50% ROS  General: NAD, appropriately dressed Constitution: Denies fever/chills EYES: denies vision changes ENMT: denies Xerostomia Cardiovascular: denies chest pain GU: denies urinary symptoms Psych: Endorses positive mood Heme/lymph/immuno: denies bruises, no abnormal bleeding   PERTINENT MEDICATIONS:  Outpatient Encounter Medications as of 09/02/2020  Medication Sig  . acetaminophen (TYLENOL) 325 MG tablet Take 650 mg by mouth See admin instructions. 650mg  twice daily And 325mg  three times daily as needed  for pain  . albuterol (VENTOLIN HFA) 108 (90 Base) MCG/ACT inhaler Inhale 2 puffs into the lungs See admin instructions.  2 puffs daily And 2 puffs daily as needed for shortness of breath  . Azelastine HCl 137 MCG/SPRAY SOLN Place 1 spray into both nostrils daily.  . B-D ULTRAFINE III SHORT PEN 31G X 8 MM MISC 1 each by Other route daily. For lantus  . Calcium Carb-Cholecalciferol (CALCIUM 600+D) 600-800 MG-UNIT TABS Take 2 tablets by mouth at bedtime.  . carboxymethylcellulose (REFRESH PLUS) 0.5 % SOLN Place 1 drop into both eyes 3 (three) times daily as needed (dry/irritated eyes).  . carvedilol (COREG) 3.125 MG tablet Take 3.125 mg by mouth 2 (two) times daily with a meal.  . cephALEXin (KEFLEX) 500 MG capsule Take 1 capsule (500 mg total) by mouth 3 (three) times daily.  . citalopram (CELEXA) 10 MG tablet Take 30 mg by mouth at bedtime.   . diphenhydrAMINE (BENADRYL) 25 mg capsule Take 1 capsule (25 mg total) by mouth every 6 (six) hours as needed for itching.  Marland Kitchen ELIQUIS 5 MG TABS tablet TAKE 1 TABLET BY MOUTH TWICE A DAY (Patient taking differently: Take 5 mg by mouth 2 (two) times daily.)  . ferrous gluconate (FERGON) 324 MG tablet Take 324 mg by mouth 3 (three) times daily.  . furosemide (LASIX) 20 MG tablet Take 1 tablet (20 mg total) by mouth daily. (Patient taking differently: Take 10 mg by mouth daily.)  . gabapentin (NEURONTIN) 300 MG capsule Take 2 capsules (600 mg total) by mouth 3 (three) times daily.  Marland Kitchen HYDROcodone-acetaminophen (NORCO/VICODIN) 5-325 MG tablet Take 0.5 tablets by mouth daily as needed for severe pain.  . hydrOXYzine (ATARAX/VISTARIL) 10 MG tablet Take 10 mg by mouth 2 (two) times daily as needed for itching.  . Insulin Glargine (BASAGLAR KWIKPEN) 100 UNIT/ML SOPN Inject 32 Units into the skin at bedtime.  Marland Kitchen NOVOLOG 100 UNIT/ML injection Inject 0-8 Units into the skin daily as needed for other. Check daily at 0730am 0-200 0 units 201-250 2 units 251-300 4 units  301-350 6 units 351-400 8 units > 400 call MD  . omeprazole (PRILOSEC) 20 MG capsule Take 20 mg by mouth at bedtime.  . polyethylene glycol powder (GLYCOLAX/MIRALAX) 17 GM/SCOOP powder Take 17 g by mouth in the morning and at bedtime. (Patient taking differently: Take 17 g by mouth daily.)  . senna-docusate (SENOKOT-S) 8.6-50 MG tablet Take 1 tablet by mouth 2 (two) times daily as needed for moderate constipation. (Patient taking differently: Take 1 tablet by mouth See admin instructions. 1 tablet daily And 1 tablet twice daily as needed for moderate constipation)  . sodium chloride (OCEAN) 0.65 % SOLN nasal spray Place 2 sprays into both nostrils daily.   No facility-administered encounter medications on file as of 09/02/2020.    HOSPICE ELIGIBILITY/DIAGNOSIS: TBD  PAST MEDICAL HISTORY:  Past Medical History:  Diagnosis Date  . Allergic rhinitis   . Anxiety   . Arthritis   . Bell's palsy   . Cataract   . Cirrhosis ([facility])   . Coarse tremors   . Colon cancer ([place])   . Diabetes mellitus without complication ([place])   . Diverticulosis   . DVT (deep venous thrombosis) ([place])   . GERD (gastroesophageal reflux disease)   . GI bleed   . Hepatitis B   . History of blood transfusion 05/2019  . History of panic attacks   . Hypertension   . Insomnia   . Iron deficiency anemia   . MDD (major depressive disorder)   . Morbid obesity ([place] [facility])   . Neuropathy   .  Portal vein thrombosis   . Psoriasis   . Renal insufficiency      SOCIAL HX: @SOCX  Patient lives in SNF for ongoing care  FAMILY HX:  Family History  Problem Relation Age of Onset  . Kidney failure Mother   . Diabetes Mother   . Obesity Mother   . Hypertension Mother   . Cirrhosis Father   . Hypertension Sister   . Kidney failure Brother   . Hypertension Brother   . Hypertension Sister   . Alcoholism Brother   . Other Brother        MRSA  . Cirrhosis Daughter   . Drug abuse Daughter     Review lab  tests/diagnostics URINALYSIS, ROUTINE W REFLEX MICROSCOPIC - Abnormal; Notable for the following components:   APPearance CLOUDY (*)    Hgb urine dipstick MODERATE (*)    Protein, ur 30 (*)    Nitrite POSITIVE (*)    Leukocytes,Ua MODERATE (*)    Bacteria, UA FEW (*)    All other components within normal limits  PROTIME-INR - Abnormal; Notable for the following components:   Prothrombin Time 16.6 (*)    INR 1.3 (*)     Recent Labs  Lab 08/26/20 1528  WBC 5.0  HGB 10.2*  HCT 34.1*  PLT 155  MCV 101.2*   Recent Labs  Lab 08/26/20 1528  NA 141  K 4.0  CL 107  CO2 26  BUN 21  CREATININE 0.82  GLUCOSE 102*   CrCl cannot be calculated (Unknown ideal weight.).  ALLERGIES:  Allergies  Allergen Reactions  . Xarelto [Rivaroxaban] Rash    Severe rash with itching  . Erythromycin Other (See Comments)    Oral Thrush  . Lipitor [Atorvastatin] Other (See Comments)    Leg cramps  . Pravachol [Pravastatin] Other (See Comments)    Leg muscle cramps  . Ivp Dye [Iodinated Diagnostic Agents] Rash     Thank you for the opportunity to participate in the care of Tonya Dougherty Please call our office at [phone] if we can be of additional assistance.  Note: Portions of this note were generated with L[employer]ist. Dictation errors may occur despite best attempts at proofreading.

## 2020-09-05 ENCOUNTER — Inpatient Hospital Stay: Payer: Medicare HMO | Attending: Oncology | Admitting: Oncology

## 2020-09-05 ENCOUNTER — Inpatient Hospital Stay: Payer: Medicare HMO

## 2020-09-05 DIAGNOSIS — K7460 Unspecified cirrhosis of liver: Secondary | ICD-10-CM | POA: Insufficient documentation

## 2020-09-05 DIAGNOSIS — F32A Depression, unspecified: Secondary | ICD-10-CM | POA: Insufficient documentation

## 2020-09-05 DIAGNOSIS — G250 Essential tremor: Secondary | ICD-10-CM | POA: Insufficient documentation

## 2020-09-05 DIAGNOSIS — I129 Hypertensive chronic kidney disease with stage 1 through stage 4 chronic kidney disease, or unspecified chronic kidney disease: Secondary | ICD-10-CM | POA: Insufficient documentation

## 2020-09-05 DIAGNOSIS — E785 Hyperlipidemia, unspecified: Secondary | ICD-10-CM | POA: Insufficient documentation

## 2020-09-05 DIAGNOSIS — E1122 Type 2 diabetes mellitus with diabetic chronic kidney disease: Secondary | ICD-10-CM | POA: Insufficient documentation

## 2020-09-05 DIAGNOSIS — N189 Chronic kidney disease, unspecified: Secondary | ICD-10-CM | POA: Insufficient documentation

## 2020-09-05 DIAGNOSIS — F419 Anxiety disorder, unspecified: Secondary | ICD-10-CM | POA: Insufficient documentation

## 2020-09-05 DIAGNOSIS — Z86718 Personal history of other venous thrombosis and embolism: Secondary | ICD-10-CM | POA: Insufficient documentation

## 2020-09-05 DIAGNOSIS — Z79899 Other long term (current) drug therapy: Secondary | ICD-10-CM | POA: Insufficient documentation

## 2020-09-05 DIAGNOSIS — C187 Malignant neoplasm of sigmoid colon: Secondary | ICD-10-CM | POA: Insufficient documentation

## 2020-09-06 ENCOUNTER — Telehealth: Payer: Self-pay | Admitting: *Deleted

## 2020-09-06 NOTE — Telephone Encounter (Signed)
Unable to reach patient regarding "no show" for 09/05/20. Called facility and spoke with nurse Latia> The did CBC (no diff) on 5/02 and tested her stools x 3 (all were positive). Requested this be faxed to office. Office will call Shante at facility to reschedule her appointment once labs are reviewed w/MD.

## 2020-09-07 NOTE — Telephone Encounter (Signed)
Facility staff report their fax machine does not recognize our fax # and unable to get labs to be sent. Provided HIM fax # as well and were not able to send it there either.  Called and left VM w/DON requesting their fax # and that we think the issue is due to their fax machine. Suggested they try another fax machine to send labs. Left contact information for DON to return call.

## 2020-09-12 ENCOUNTER — Telehealth: Payer: Self-pay | Admitting: Oncology

## 2020-09-12 NOTE — Telephone Encounter (Signed)
Scheduled appt per 5/16 sch msg - called pt sister [name] and spoke with Philomena Course at [facility] facility. Both have confirmed her appt for 5/20

## 2020-09-12 NOTE — Telephone Encounter (Signed)
Scheduling message sent to get patient here this week or early next week for lab/OV. Facility has not responded to recent request to DON requesting to re-fax labs to office from 08/29/20.

## 2020-09-16 ENCOUNTER — Inpatient Hospital Stay: Payer: Medicare HMO

## 2020-09-16 ENCOUNTER — Inpatient Hospital Stay (HOSPITAL_BASED_OUTPATIENT_CLINIC_OR_DEPARTMENT_OTHER): Payer: Medicare HMO | Admitting: Nurse Practitioner

## 2020-09-16 ENCOUNTER — Other Ambulatory Visit: Payer: Self-pay

## 2020-09-16 ENCOUNTER — Encounter: Payer: Self-pay | Admitting: Nurse Practitioner

## 2020-09-16 VITALS — BP 158/76 | HR 66 | Temp 97.8°F | Resp 20 | Ht 62.0 in | Wt 243.8 lb

## 2020-09-16 DIAGNOSIS — C187 Malignant neoplasm of sigmoid colon: Secondary | ICD-10-CM | POA: Diagnosis present

## 2020-09-16 DIAGNOSIS — E1122 Type 2 diabetes mellitus with diabetic chronic kidney disease: Secondary | ICD-10-CM | POA: Diagnosis not present

## 2020-09-16 DIAGNOSIS — I129 Hypertensive chronic kidney disease with stage 1 through stage 4 chronic kidney disease, or unspecified chronic kidney disease: Secondary | ICD-10-CM | POA: Diagnosis not present

## 2020-09-16 DIAGNOSIS — N189 Chronic kidney disease, unspecified: Secondary | ICD-10-CM | POA: Diagnosis not present

## 2020-09-16 DIAGNOSIS — Z86718 Personal history of other venous thrombosis and embolism: Secondary | ICD-10-CM | POA: Diagnosis not present

## 2020-09-16 DIAGNOSIS — Z79899 Other long term (current) drug therapy: Secondary | ICD-10-CM | POA: Diagnosis not present

## 2020-09-16 DIAGNOSIS — G250 Essential tremor: Secondary | ICD-10-CM | POA: Diagnosis not present

## 2020-09-16 DIAGNOSIS — F419 Anxiety disorder, unspecified: Secondary | ICD-10-CM | POA: Diagnosis not present

## 2020-09-16 DIAGNOSIS — K7460 Unspecified cirrhosis of liver: Secondary | ICD-10-CM | POA: Diagnosis not present

## 2020-09-16 DIAGNOSIS — E785 Hyperlipidemia, unspecified: Secondary | ICD-10-CM | POA: Diagnosis not present

## 2020-09-16 DIAGNOSIS — F32A Depression, unspecified: Secondary | ICD-10-CM | POA: Diagnosis not present

## 2020-09-16 LAB — CBC WITH DIFFERENTIAL (CANCER CENTER ONLY)
Abs Immature Granulocytes: 0.01 10*3/uL (ref 0.00–0.07)
Basophils Absolute: 0 10*3/uL (ref 0.0–0.1)
Basophils Relative: 1 %
Eosinophils Absolute: 0.1 10*3/uL (ref 0.0–0.5)
Eosinophils Relative: 3 %
HCT: 36.4 % (ref 36.0–46.0)
Hemoglobin: 11 g/dL — ABNORMAL LOW (ref 12.0–15.0)
Immature Granulocytes: 0 %
Lymphocytes Relative: 22 %
Lymphs Abs: 0.7 10*3/uL (ref 0.7–4.0)
MCH: 30.6 pg (ref 26.0–34.0)
MCHC: 30.2 g/dL (ref 30.0–36.0)
MCV: 101.1 fL — ABNORMAL HIGH (ref 80.0–100.0)
Monocytes Absolute: 0.4 10*3/uL (ref 0.1–1.0)
Monocytes Relative: 13 %
Neutro Abs: 2 10*3/uL (ref 1.7–7.7)
Neutrophils Relative %: 61 %
Platelet Count: 132 10*3/uL — ABNORMAL LOW (ref 150–400)
RBC: 3.6 MIL/uL — ABNORMAL LOW (ref 3.87–5.11)
RDW: 20.5 % — ABNORMAL HIGH (ref 11.5–15.5)
WBC Count: 3.2 10*3/uL — ABNORMAL LOW (ref 4.0–10.5)
nRBC: 0 % (ref 0.0–0.2)

## 2020-09-16 LAB — SAMPLE TO BLOOD BANK

## 2020-09-16 NOTE — Progress Notes (Signed)
[facility] OFFICE PROGRESS NOTE   Diagnosis: Colon cancer  INTERVAL HISTORY:   Ms. Tsan returns for follow-up.  Ferritin level returned at 12 on 08/08/2020.  IV iron recommended.  Ms. Blick decided to delay IV iron until her next visit.  She reports in general she is feeling well.  She has noticed blood when she urinates for the past 5 or 6 days.  She did not see blood today.  No dysuria.  She is taking oral iron about once every other day.  She notes diarrhea associated with the iron tablets.  Pain continues to be improved.  The pain does not occur on a daily basis.  She reports a good appetite.  Objective:  Vital signs in last 24 hours:  Blood pressure (!) 158/76, pulse 66, temperature 97.8 F (36.6 C), temperature source Oral, resp. rate 20, height '5\' 2"'  (1.575 m), weight 243 lb 12.8 oz (110.6 kg), SpO2 94 %.     Resp: Lungs clear bilaterally. Cardio: Regular rate and rhythm. GI: Abdomen soft and nontender.  No hepatomegaly. Vascular: Firm lower leg edema bilaterally, chronic stasis change.   Lab Results:  Lab Results  Component Value Date   WBC 3.2 (L) 09/16/2020   HGB 11.0 (L) 09/16/2020   HCT 36.4 09/16/2020   MCV 101.1 (H) 09/16/2020   PLT 132 (L) 09/16/2020   NEUTROABS 2.0 09/16/2020    Imaging:  No results found.  Medications: I have reviewed the patient's current medications.  Assessment/Plan: 1.Metastatic colon cancer,pT3, pN0 adenocarcinoma of the sigmoid colon diagnosed April 2021, status post sigmoidectomy 08/14/2019- MSI Stable  Left sacral mass consistent with colorectalprimary -MRI of the thoracic and lumbar spine 03/24/2020 -abnormal T2/STIR hyperintense lesion involving the central and left aspect of the sacrum concerning for osseous metastatic disease, probable invasion of adjacent left S1-S2 neural foramina, extraosseous extension into the adjacent presacral space -CT chest/abdomen/pelvis 03/25/2020-destructive left sacral  lesion, associated 2.8 x 3.5 cm soft tissue component anterior to the left sacrum worrisome for metastasis, 2.6 cm hypoenhancing mass in the right upper kidney-? Metastasis versus renal cell carcinoma -CT-guided biopsy of left sacral mass 03/28/2020-metastatic adenocarcinoma consistent with GI primary, MSS, tumor mutation burden 4, IDH 1, K-ras wild-type -Radiation to left sacral mass 04/01/2020-04/14/2020 -CTs 08/05/2020-similar size and appearance of left presacral soft tissue metastasis and lytic lesion within the left sacrum.  No progressive metastatic disease identified.  Previously identified indeterminate lesion in the interpolar region of the right kidney not well seen but has not clearly resolved.  No bladder wall thickening.  Mildly increased size of small mediastinal lymph nodes.  Small retroperitoneal pelvic lymph nodes unchanged.  Stable additional incidental findings including morphologic changes of cirrhosis, splenomegaly, cholelithiasis and aortic atherosclerosis.  Heterogeneous enlargement of the thyroid gland with multiple calcifications unchanged from previous CT examinations. 2.History of iron deficiency anemia 3. Cirrhosis with splenomegaly 5. Left lower extremity DVT diagnosed 2019 6. Hypertension 7. Diabetes mellitus 8. Benign essential tremor 9. CKD 10. Hyperlipidemia 11. Morbid obesity 12. Anxiety and depression 13. 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 Pain secondary to #1, improved following palliative radiation 16. Right renal mass on CT 03/25/2020; previously identified indeterminate lesion in the interpolar region of the right kidney not well seen but not clearly resolved on CT 08/05/2020. 17.  Anemia-progressive 08/08/2020.  Ferritin added to labs.  Made request to facility to complete stool Hemoccult cards x3 and repeat CBC in 2 weeks.  Hemoglobin improved 09/16/2020. 18.  Mild leukopenia and thrombocytopenia-question  related to cirrhosis   Disposition: Ms. Gilmer appears unchanged.  There is no clinical evidence of progression of the colon cancer. Plan to continue to follow with observation.  We reviewed the CBC from today.  Hemoglobin is better.  She will continue oral iron.  We will asked the nursing facility to check a urinalysis/culture due to complaint of recent hematuria.  She will return for lab and follow-up in 6 weeks.  We are available to see her sooner if needed.  Ned Card ANP/GNP-BC   09/16/2020  1:28 PM

## 2020-10-25 ENCOUNTER — Inpatient Hospital Stay: Payer: Medicare HMO

## 2020-10-25 ENCOUNTER — Other Ambulatory Visit: Payer: Self-pay

## 2020-10-25 ENCOUNTER — Inpatient Hospital Stay: Payer: Medicare HMO | Attending: Oncology | Admitting: Oncology

## 2020-10-25 VITALS — BP 127/64 | HR 62 | Temp 97.9°F | Resp 18 | Ht 62.0 in | Wt 227.0 lb

## 2020-10-25 DIAGNOSIS — Z86718 Personal history of other venous thrombosis and embolism: Secondary | ICD-10-CM | POA: Insufficient documentation

## 2020-10-25 DIAGNOSIS — F419 Anxiety disorder, unspecified: Secondary | ICD-10-CM | POA: Insufficient documentation

## 2020-10-25 DIAGNOSIS — F17210 Nicotine dependence, cigarettes, uncomplicated: Secondary | ICD-10-CM | POA: Insufficient documentation

## 2020-10-25 DIAGNOSIS — G250 Essential tremor: Secondary | ICD-10-CM | POA: Insufficient documentation

## 2020-10-25 DIAGNOSIS — C7951 Secondary malignant neoplasm of bone: Secondary | ICD-10-CM | POA: Insufficient documentation

## 2020-10-25 DIAGNOSIS — K7460 Unspecified cirrhosis of liver: Secondary | ICD-10-CM | POA: Diagnosis not present

## 2020-10-25 DIAGNOSIS — C187 Malignant neoplasm of sigmoid colon: Secondary | ICD-10-CM | POA: Insufficient documentation

## 2020-10-25 DIAGNOSIS — F32A Depression, unspecified: Secondary | ICD-10-CM | POA: Diagnosis not present

## 2020-10-25 DIAGNOSIS — E785 Hyperlipidemia, unspecified: Secondary | ICD-10-CM | POA: Insufficient documentation

## 2020-10-25 DIAGNOSIS — I129 Hypertensive chronic kidney disease with stage 1 through stage 4 chronic kidney disease, or unspecified chronic kidney disease: Secondary | ICD-10-CM | POA: Diagnosis not present

## 2020-10-25 DIAGNOSIS — D72819 Decreased white blood cell count, unspecified: Secondary | ICD-10-CM | POA: Insufficient documentation

## 2020-10-25 DIAGNOSIS — N189 Chronic kidney disease, unspecified: Secondary | ICD-10-CM | POA: Diagnosis not present

## 2020-10-25 DIAGNOSIS — E1122 Type 2 diabetes mellitus with diabetic chronic kidney disease: Secondary | ICD-10-CM | POA: Diagnosis not present

## 2020-10-25 LAB — CBC WITH DIFFERENTIAL (CANCER CENTER ONLY)
Abs Immature Granulocytes: 0.01 10*3/uL (ref 0.00–0.07)
Basophils Absolute: 0 10*3/uL (ref 0.0–0.1)
Basophils Relative: 1 %
Eosinophils Absolute: 0.1 10*3/uL (ref 0.0–0.5)
Eosinophils Relative: 3 %
HCT: 39.5 % (ref 36.0–46.0)
Hemoglobin: 12.4 g/dL (ref 12.0–15.0)
Immature Granulocytes: 0 %
Lymphocytes Relative: 24 %
Lymphs Abs: 0.9 10*3/uL (ref 0.7–4.0)
MCH: 32.1 pg (ref 26.0–34.0)
MCHC: 31.4 g/dL (ref 30.0–36.0)
MCV: 102.3 fL — ABNORMAL HIGH (ref 80.0–100.0)
Monocytes Absolute: 0.4 10*3/uL (ref 0.1–1.0)
Monocytes Relative: 12 %
Neutro Abs: 2.2 10*3/uL (ref 1.7–7.7)
Neutrophils Relative %: 60 %
Platelet Count: 121 10*3/uL — ABNORMAL LOW (ref 150–400)
RBC: 3.86 MIL/uL — ABNORMAL LOW (ref 3.87–5.11)
RDW: 16 % — ABNORMAL HIGH (ref 11.5–15.5)
WBC Count: 3.7 10*3/uL — ABNORMAL LOW (ref 4.0–10.5)
nRBC: 0 % (ref 0.0–0.2)

## 2020-10-25 LAB — FERRITIN: Ferritin: 43 ng/mL (ref 11–307)

## 2020-10-25 LAB — CEA (ACCESS): CEA (CHCC): 37.93 ng/mL — ABNORMAL HIGH (ref 0.00–5.00)

## 2020-10-25 NOTE — Progress Notes (Signed)
[facility] OFFICE PROGRESS NOTE   Diagnosis: Colon cancer  INTERVAL HISTORY:   [name] returns as scheduled.  She continues to reside in the skilled nursing facility.  She has developed nodularity at the left lower leg.  She does not have significant pain at the sacrum.  She takes hydrocodone twice daily and uses Voltaren gel.  Objective:  Vital signs in last 24 hours:  Blood pressure 127/64, pulse 62, temperature 97.9 F (36.6 C), temperature source Oral, resp. rate 18, height '5\' 2"'  (1.575 m), weight 227 lb (103 kg), SpO2 97 %.  Lymphatics: No cervical, supraclavicular, axillary, or inguinal nodes Resp: Lungs clear bilaterally Cardio: Regular rate and rhythm GI: No hepatosplenomegaly, no mass, no apparent ascites Vascular: Trace edema at the lower leg on the left greater than right.  Skin: Yeast rash in the groin bilaterally, nodular erythematous lesions with skin thickening at the left lower leg   Lab Results:  Lab Results  Component Value Date   WBC 3.7 (L) 10/25/2020   HGB 12.4 10/25/2020   HCT 39.5 10/25/2020   MCV 102.3 (H) 10/25/2020   PLT 121 (L) 10/25/2020   NEUTROABS 2.2 10/25/2020    CMP  Lab Results  Component Value Date   NA 141 08/26/2020   K 4.0 08/26/2020   CL 107 08/26/2020   CO2 26 08/26/2020   GLUCOSE 102 (H) 08/26/2020   BUN 21 08/26/2020   CREATININE 0.82 08/26/2020   CALCIUM 9.3 08/26/2020   PROT 7.1 08/26/2020   ALBUMIN 3.1 (L) 08/26/2020   AST 29 08/26/2020   ALT 15 08/26/2020   ALKPHOS 118 08/26/2020   BILITOT 1.0 08/26/2020   GFRNONAA >60 08/26/2020   GFRAA >60 08/16/2019    Lab Results  Component Value Date   CEA1 19.86 (H) 08/08/2020    Medications: I have reviewed the patient's current medications.   Assessment/Plan:  1.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, status post sigmoidectomy 08/14/2019 - MSI Stable  Left sacral mass consistent with  colorectal primary -MRI of the thoracic and lumbar spine 03/24/2020 -abnormal T2/STIR hyperintense lesion involving the central and left aspect of the sacrum concerning for osseous metastatic disease, probable invasion of adjacent left S1-S2 neural foramina, extraosseous extension into the adjacent presacral space -CT chest/abdomen/pelvis 03/25/2020-destructive left sacral lesion, associated 2.8 x 3.5 cm soft tissue component anterior to the left sacrum worrisome for metastasis, 2.6 cm hypoenhancing mass in the right upper kidney-?  Metastasis versus renal cell carcinoma -CT-guided biopsy of left sacral mass 03/28/2020-metastatic adenocarcinoma consistent with GI primary, MSS, tumor mutation burden 4, IDH 1, K-ras wild-type -Radiation to left sacral mass 04/01/2020-04/14/2020 -CTs 08/05/2020-similar size and appearance of left presacral soft tissue metastasis and lytic lesion within the left sacrum.  No progressive metastatic disease identified.  Previously identified indeterminate lesion in the interpolar region of the right kidney not well seen but has not clearly resolved.  No bladder wall thickening.  Mildly increased size of small mediastinal lymph nodes.  Small retroperitoneal pelvic lymph nodes unchanged.  Stable additional incidental findings including morphologic changes of cirrhosis, splenomegaly, cholelithiasis and aortic atherosclerosis.  Heterogeneous enlargement of the thyroid gland with multiple calcifications unchanged from previous CT examinations. 2.  History of iron deficiency anemia 3.  Cirrhosis with splenomegaly 5.  Left lower extremity DVT diagnosed 2019 6.  Hypertension 7.  Diabetes mellitus 8.  Benign essential tremor 9.  CKD 10.  Hyperlipidemia 11.  Morbid obesity 12.  Anxiety and depression 13.  Tobacco dependence 14.  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  Pain secondary to #1, improved following palliative radiation 16.  Right renal mass on CT 03/25/2020;  previously identified indeterminate lesion in the interpolar region of the right kidney not well seen but not clearly resolved on CT 08/05/2020. 17.  Anemia-progressive 08/08/2020.  Ferritin added to labs.  Made request to facility to complete stool Hemoccult cards x3 and repeat CBC in 2 weeks.  Hemoglobin improved 09/16/2020. 18.  Mild leukopenia and thrombocytopenia-question related to cirrhosis     Disposition: Tonya Dougherty appears stable.  There is no clinical evidence for progression of the metastatic colon cancer.  She will undergo a restaging CT evaluation prior to an office visit in late August.  She has a yeast rash in the groin.  I recommend evaluation and treatment of the rash by the providers at the skilled nursing facility.  The skin changes at the left lower leg are likely related to chronic stasis.  I recommended the nursing facility provider consider a dermatology referral.    Betsy Coder, MD  10/25/2020  2:36 PM

## 2020-10-26 LAB — CEA (IN HOUSE-CHCC): CEA (CHCC-In House): 33.28 ng/mL — ABNORMAL HIGH (ref 0.00–5.00)

## 2020-11-20 ENCOUNTER — Emergency Department (HOSPITAL_COMMUNITY)
Admission: EM | Admit: 2020-11-20 | Discharge: 2020-11-20 | Disposition: A | Discharge status: Home / Self Care | Payer: Medicare HMO | Attending: Emergency Medicine | Admitting: Emergency Medicine

## 2020-11-20 ENCOUNTER — Emergency Department (HOSPITAL_COMMUNITY): Payer: Medicare HMO

## 2020-11-20 ENCOUNTER — Other Ambulatory Visit: Payer: Self-pay

## 2020-11-20 ENCOUNTER — Encounter (HOSPITAL_COMMUNITY): Payer: Self-pay

## 2020-11-20 DIAGNOSIS — R52 Pain, unspecified: Secondary | ICD-10-CM | POA: Diagnosis present

## 2020-11-20 DIAGNOSIS — Z7901 Long term (current) use of anticoagulants: Secondary | ICD-10-CM | POA: Insufficient documentation

## 2020-11-20 DIAGNOSIS — Z85830 Personal history of malignant neoplasm of bone: Secondary | ICD-10-CM | POA: Insufficient documentation

## 2020-11-20 DIAGNOSIS — W06XXXA Fall from bed, initial encounter: Secondary | ICD-10-CM | POA: Insufficient documentation

## 2020-11-20 DIAGNOSIS — I10 Essential (primary) hypertension: Secondary | ICD-10-CM | POA: Insufficient documentation

## 2020-11-20 DIAGNOSIS — Z79899 Other long term (current) drug therapy: Secondary | ICD-10-CM | POA: Diagnosis not present

## 2020-11-20 DIAGNOSIS — Z85038 Personal history of other malignant neoplasm of large intestine: Secondary | ICD-10-CM | POA: Diagnosis not present

## 2020-11-20 DIAGNOSIS — F17210 Nicotine dependence, cigarettes, uncomplicated: Secondary | ICD-10-CM | POA: Diagnosis not present

## 2020-11-20 DIAGNOSIS — E782 Mixed hyperlipidemia: Secondary | ICD-10-CM | POA: Diagnosis not present

## 2020-11-20 DIAGNOSIS — M2550 Pain in unspecified joint: Secondary | ICD-10-CM | POA: Insufficient documentation

## 2020-11-20 DIAGNOSIS — E1169 Type 2 diabetes mellitus with other specified complication: Secondary | ICD-10-CM | POA: Insufficient documentation

## 2020-11-20 DIAGNOSIS — Z794 Long term (current) use of insulin: Secondary | ICD-10-CM | POA: Insufficient documentation

## 2020-11-20 DIAGNOSIS — W19XXXA Unspecified fall, initial encounter: Secondary | ICD-10-CM

## 2020-11-20 IMAGING — CR DG PELVIS 1-2V
1 series · 1 of 1 positions shown · non-contrast
Comparison: CT abdomen pelvis [DATE], MR lumbar spine
[DATE]

CLINICAL DATA: Fall, back pain

EXAM:
LUMBAR SPINE - COMPLETE 4+ VIEW; PELVIS - 1-2 VIEW

[pelvis ap]
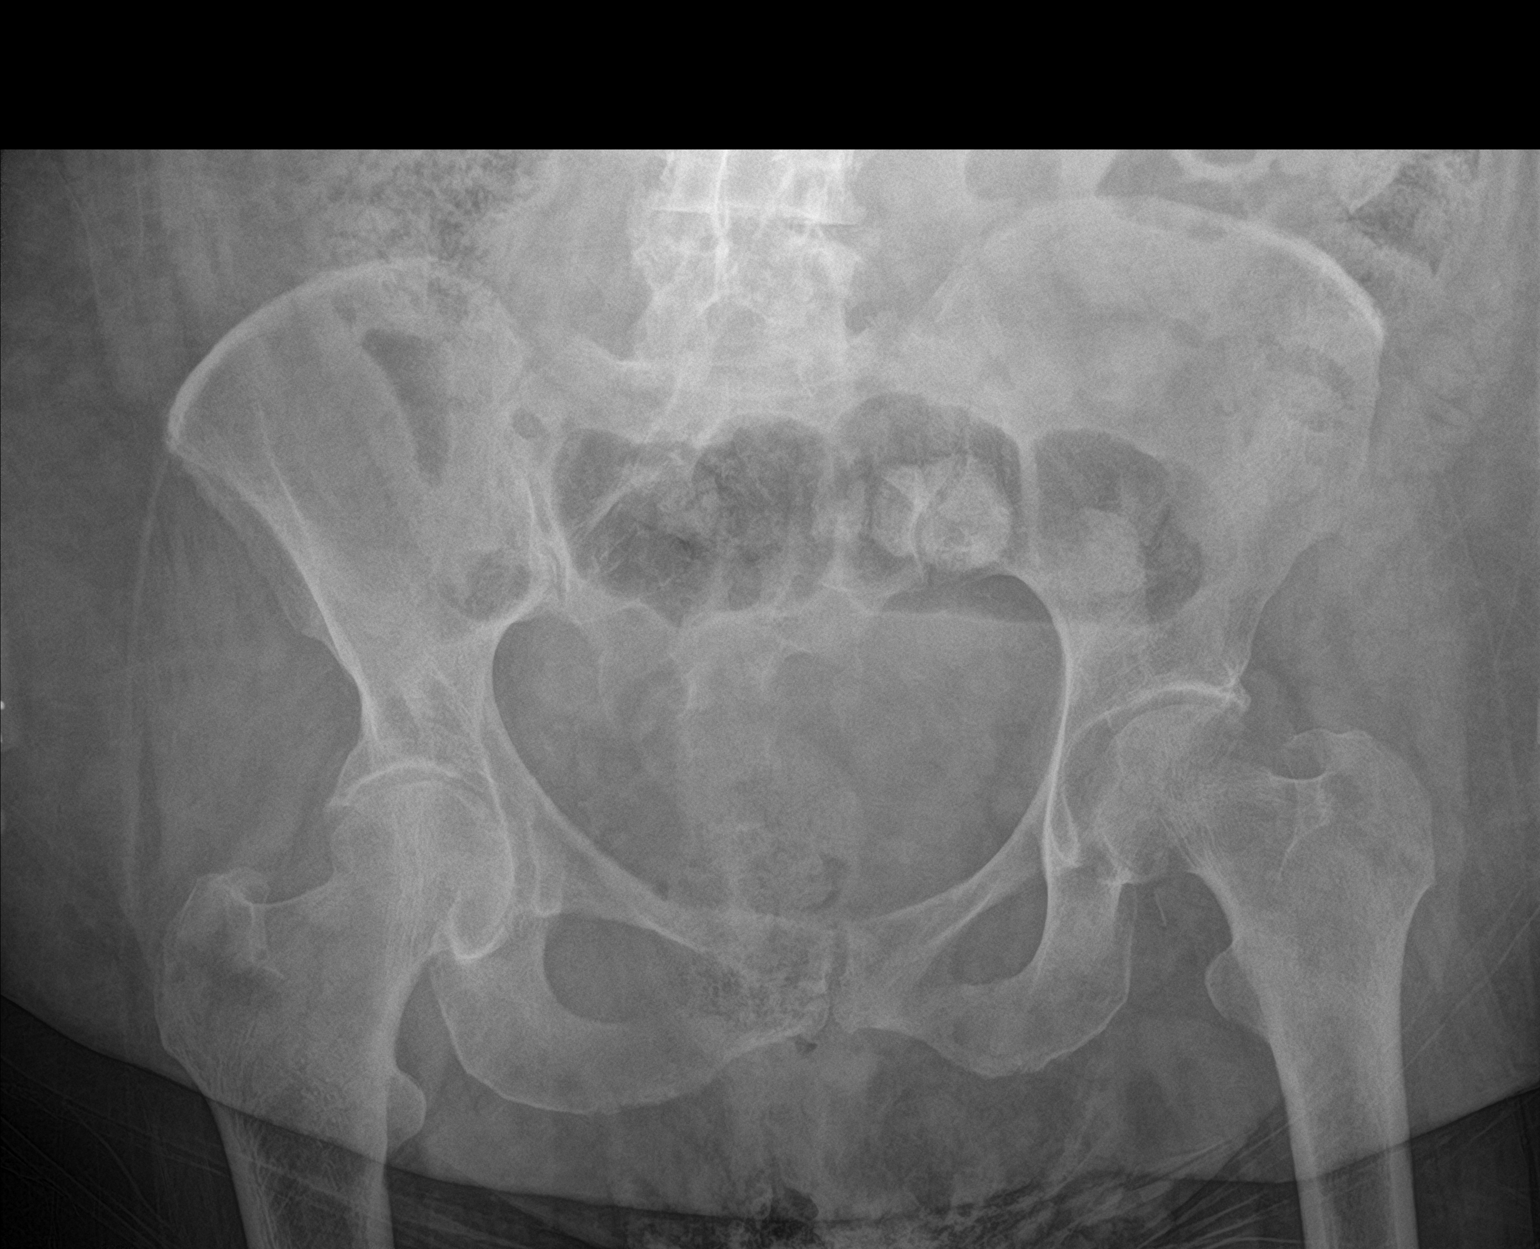

[1 of 1 positions shown; findings below may reference images not displayed]

FINDINGS: Radiographic assessment is limited by levocurvature of the spine and
diffuse bony demineralization which may limit detection of subtle
nondisplaced fractures. Levocurvature is centered at an apex of
L2-3. No acute traumatic listhesis. No clear vertebral body fracture
or height loss. Stepwise likely degenerative retrolisthesis noted
L2-L4 with minimal grade 1 anterolisthesis L4 on L5 of approximately
5 mm. No discernible spondylolysis. Background of diffuse discogenic
and facet degenerative changes, maximal towards the lower lumbar
levels. Vascular calcium noted in the abdominal aorta without
aneurysm or ectasia.

Conspicuous through the neck of the left femur, while this may be
projectional, a transcervical femoral fracture is not fully
excluded. Remaining bones of the pelvis appear intact and congruent.
Degenerative changes noted in the SI joints, symphysis pubis and
bilateral hips. Soft tissues are noncontributory.
IMPRESSION: Lumbar spine:

1. Diffuse bony demineralization, may limit detection of subtle
osseous injury.
2. No clear acute fracture or traumatic listhesis of the lumbar
spine.
3. Lumbar levocurvature, apex L2-3.
4. Multilevel discogenic and facet degenerative changes, maximal
towards the lower lumbar levels.

Pelvis:

1. Conspicuous lucency through the left femoral neck, could reflect
a transcervical femoral neck fracture versus projectional anomaly.
Could consider further interrogation with dedicated views or
cross-sectional imaging if there is clinical concern.
2. Remaining bones of the pelvis and proximal right femur appear
intact.
3. Degenerative changes of the hips and pelvis as above.

## 2020-11-20 IMAGING — CR DG LUMBAR SPINE COMPLETE 4+V
5 series · 5 of 5 positions shown · non-contrast
Comparison: CT abdomen pelvis [DATE], MR lumbar spine
[DATE]

CLINICAL DATA: Fall, back pain

EXAM:
LUMBAR SPINE - COMPLETE 4+ VIEW; PELVIS - 1-2 VIEW

[l-spine ap]
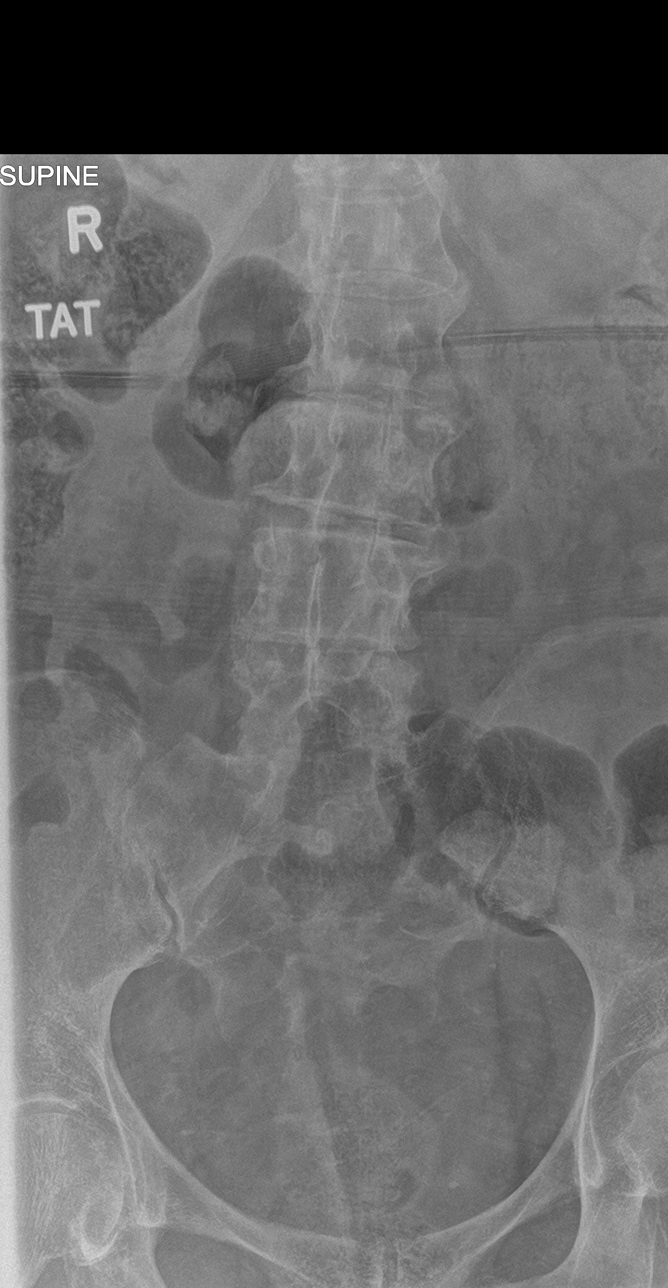

[l-spine obl (1 of 2)]
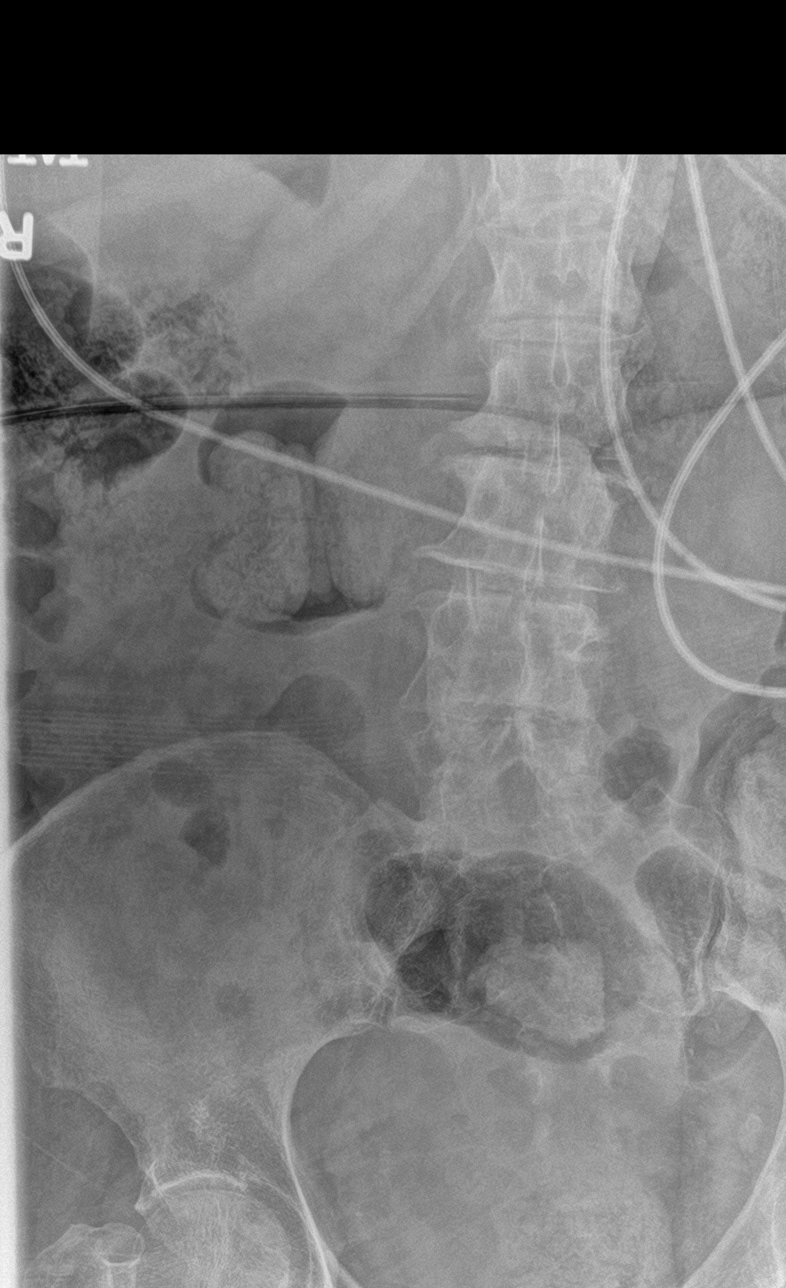

[l-spine obl (2 of 2)]
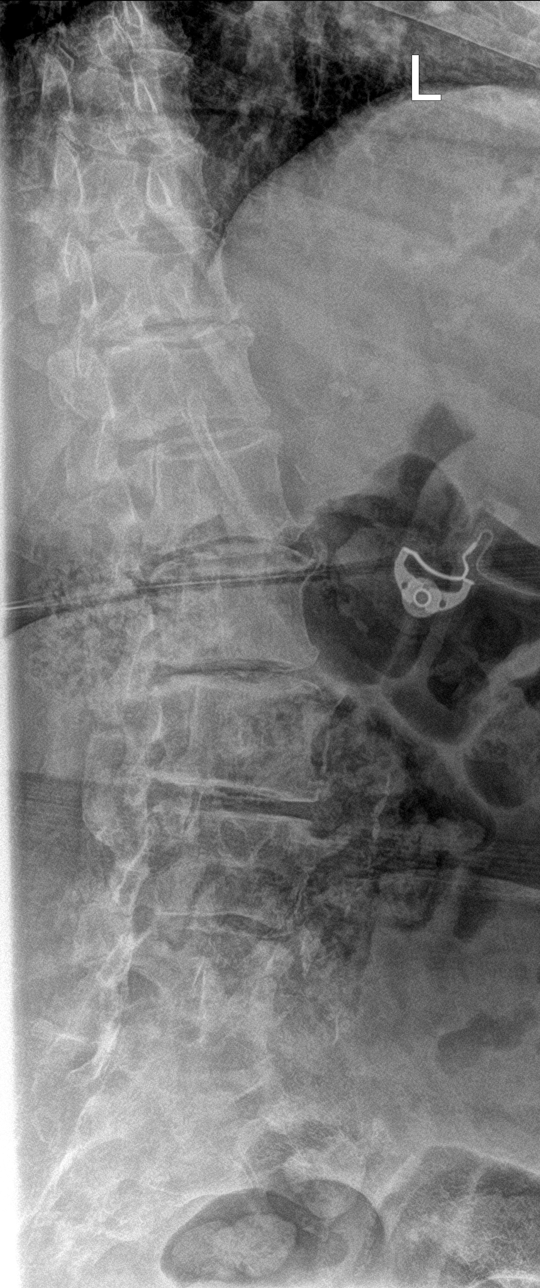

[l-spine lat]
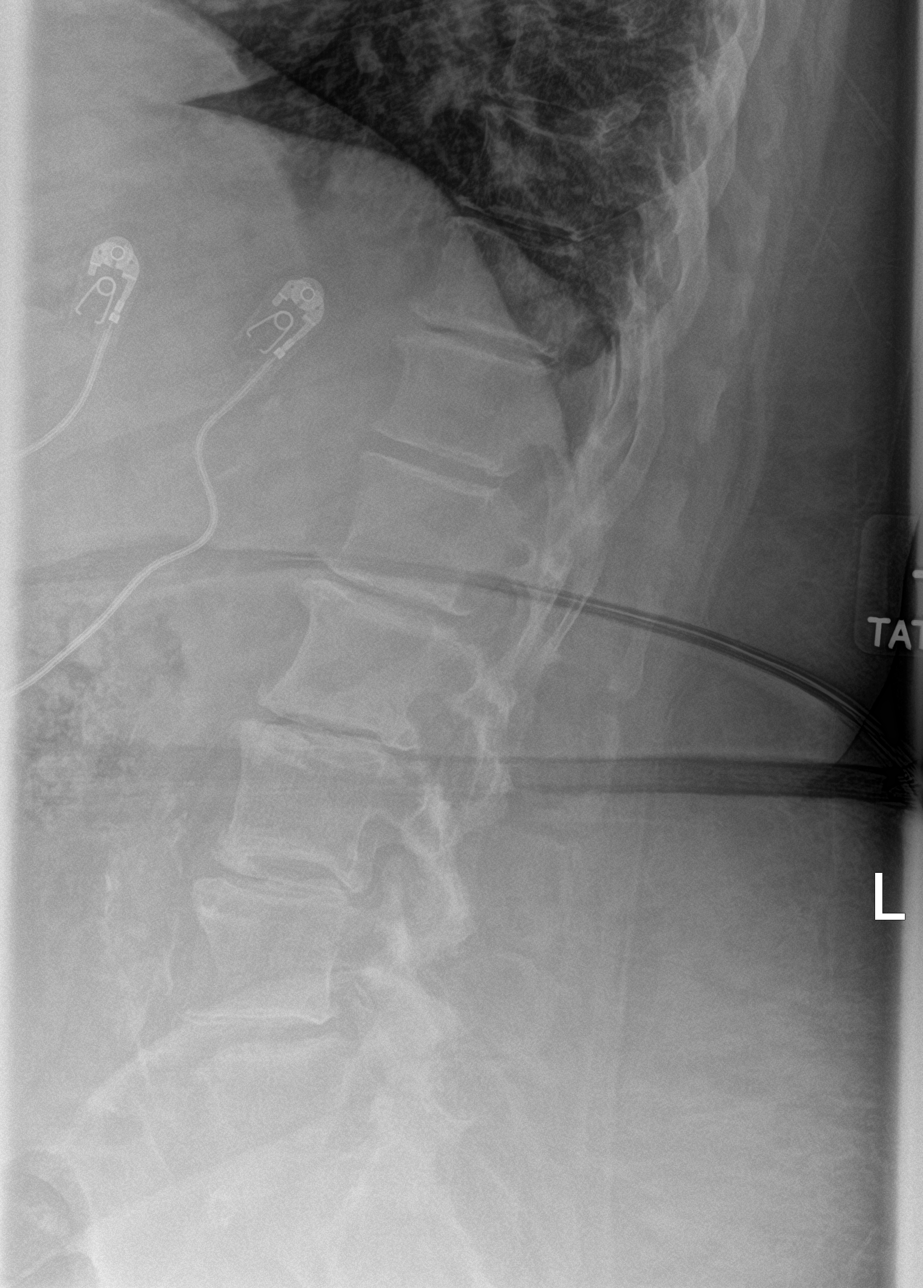

[l-spine spot]
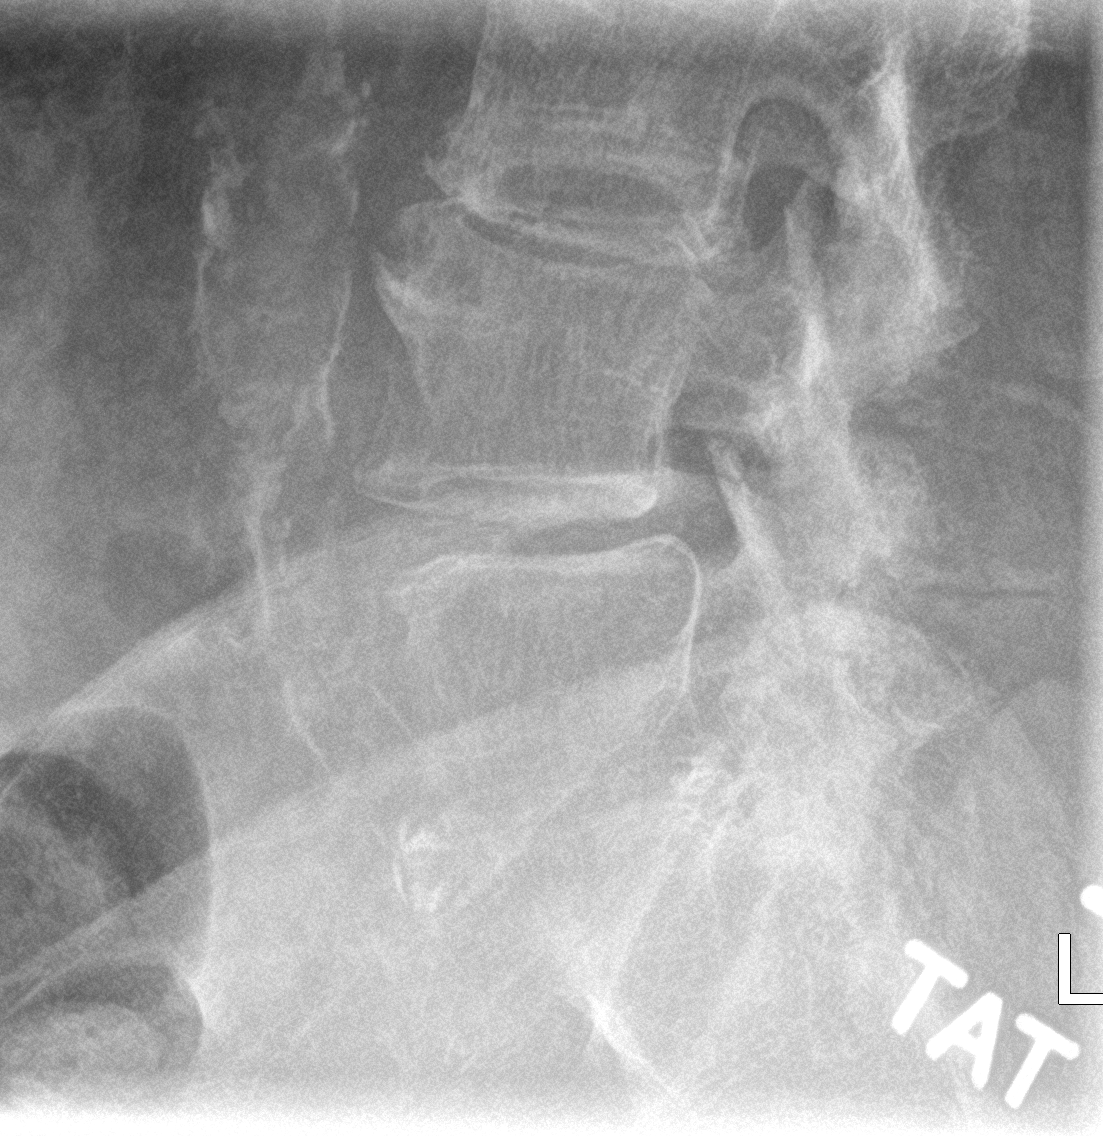

[5 of 5 positions shown; findings below may reference images not displayed]

FINDINGS: Radiographic assessment is limited by levocurvature of the spine and
diffuse bony demineralization which may limit detection of subtle
nondisplaced fractures. Levocurvature is centered at an apex of
L2-3. No acute traumatic listhesis. No clear vertebral body fracture
or height loss. Stepwise likely degenerative retrolisthesis noted
L2-L4 with minimal grade 1 anterolisthesis L4 on L5 of approximately
5 mm. No discernible spondylolysis. Background of diffuse discogenic
and facet degenerative changes, maximal towards the lower lumbar
levels. Vascular calcium noted in the abdominal aorta without
aneurysm or ectasia.

Conspicuous through the neck of the left femur, while this may be
projectional, a transcervical femoral fracture is not fully
excluded. Remaining bones of the pelvis appear intact and congruent.
Degenerative changes noted in the SI joints, symphysis pubis and
bilateral hips. Soft tissues are noncontributory.
IMPRESSION: Lumbar spine:

1. Diffuse bony demineralization, may limit detection of subtle
osseous injury.
2. No clear acute fracture or traumatic listhesis of the lumbar
spine.
3. Lumbar levocurvature, apex L2-3.
4. Multilevel discogenic and facet degenerative changes, maximal
towards the lower lumbar levels.

Pelvis:

1. Conspicuous lucency through the left femoral neck, could reflect
a transcervical femoral neck fracture versus projectional anomaly.
Could consider further interrogation with dedicated views or
cross-sectional imaging if there is clinical concern.
2. Remaining bones of the pelvis and proximal right femur appear
intact.
3. Degenerative changes of the hips and pelvis as above.

## 2020-11-20 IMAGING — CT CT PELVIS W/O CM
2 of 5 series · 15 of 46 positions shown, 17 images · non-contrast
Comparison: Pelvis [DATE].  CT abdomen and pelvis [DATE]

CLINICAL DATA: Unwitnessed fall.

EXAM:
CT PELVIS WITHOUT CONTRAST
TECHNIQUE: Multidetector CT imaging of the pelvis was performed following the
standard protocol without intravenous contrast.

[Series 6: pelvis 2.0 st · axial · 0.91mm/px · z∈[-365,-147]mm · 12 of 127 slices shown, 14 images]
[im 9/127  soft-tissue]
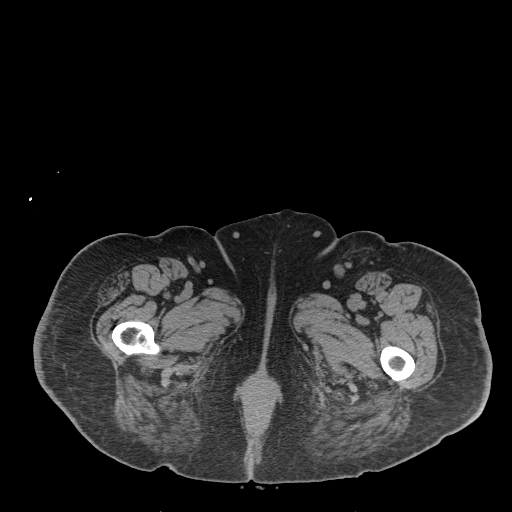
[im 9/127  bone]
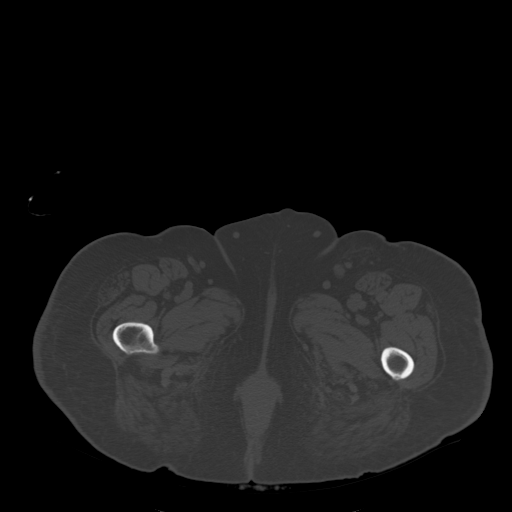
[im 17/127  soft-tissue]
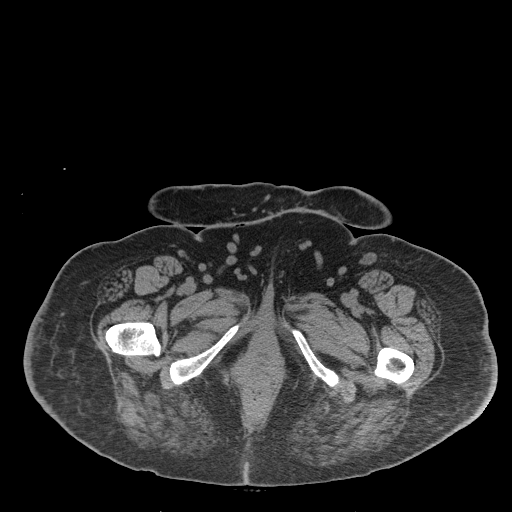
[im 29/127  soft-tissue]
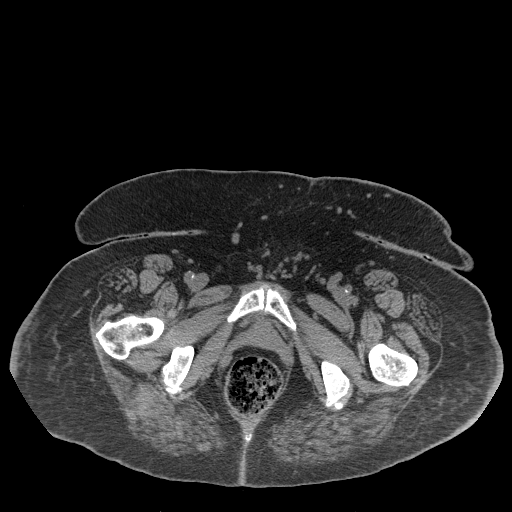
[im 37/127  soft-tissue]
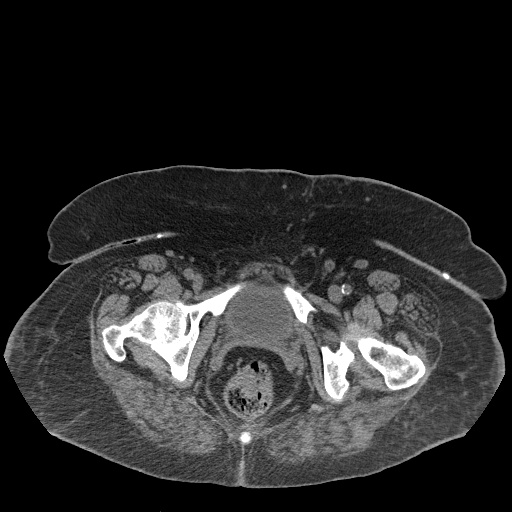
[im 49/127  soft-tissue]
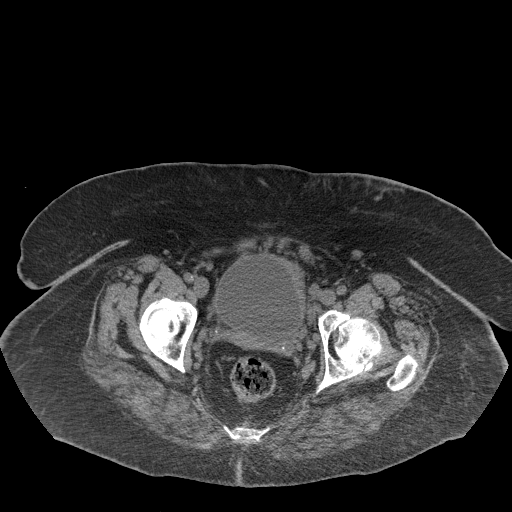
[im 57/127  soft-tissue]
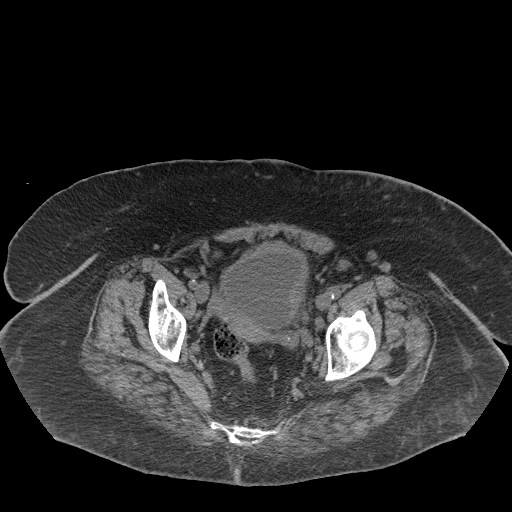
[im 70/127  soft-tissue]
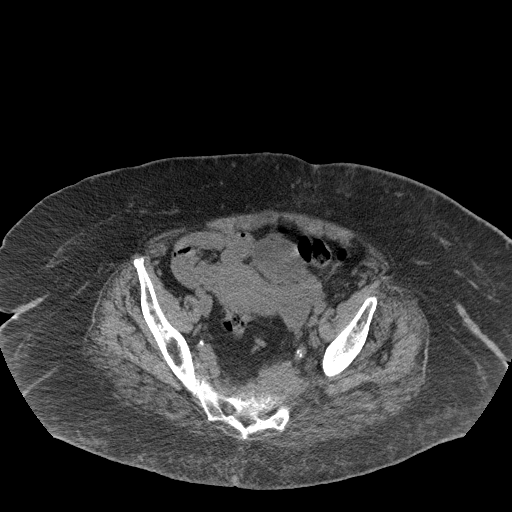
[im 78/127  soft-tissue]
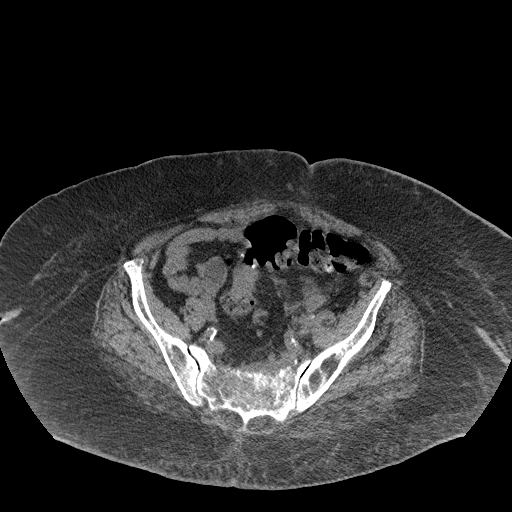
[im 90/127  soft-tissue]
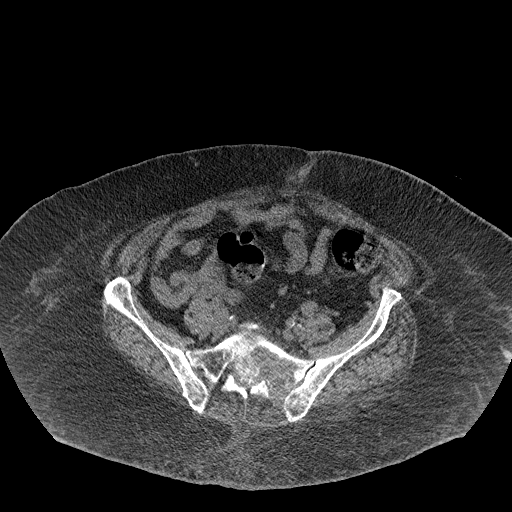
[im 90/127  bone]
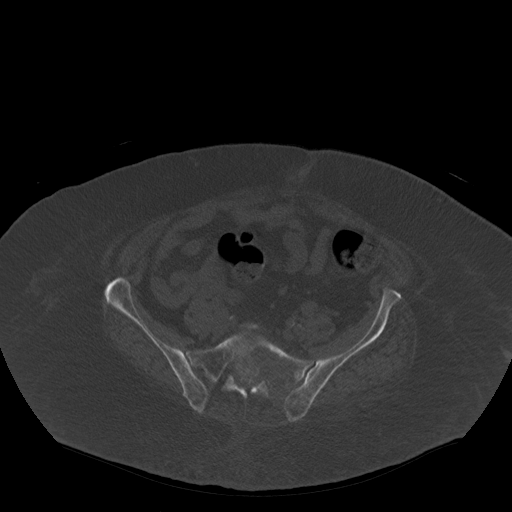
[im 98/127  soft-tissue]
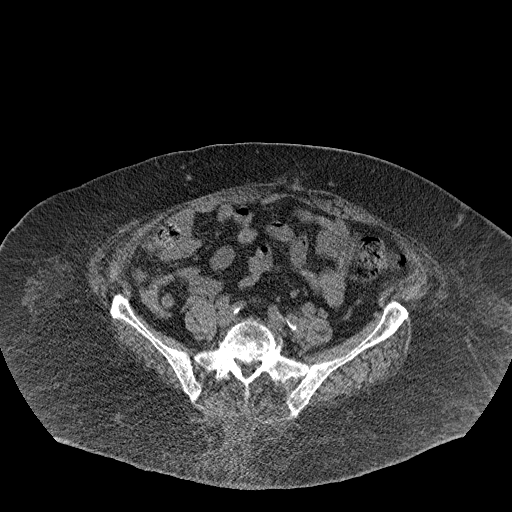
[im 110/127  soft-tissue]
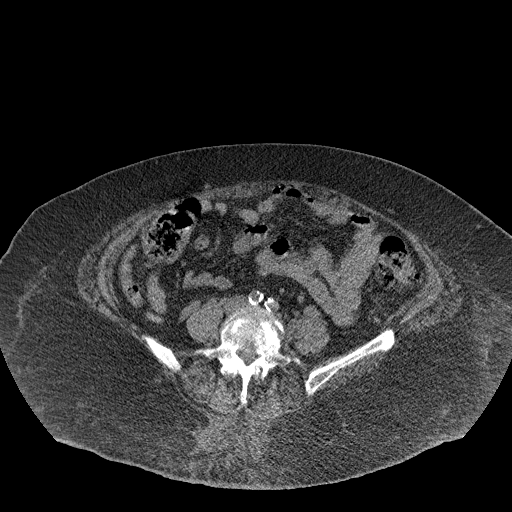
[im 118/127  soft-tissue]
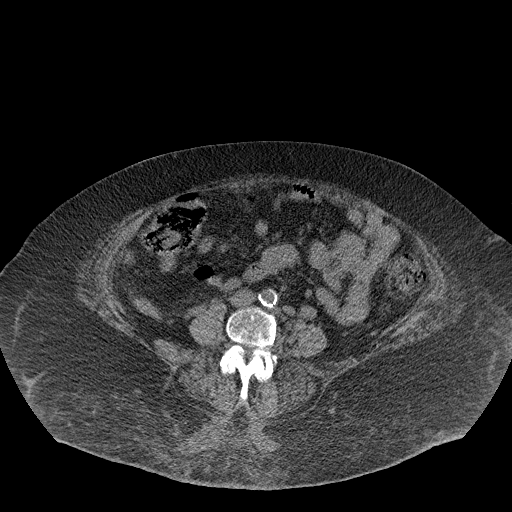

[Series 11: pelvis soft tissue coronal · coronal · 0.50mm/px · 3 of 191 slices shown]
[im 48/191  soft-tissue]
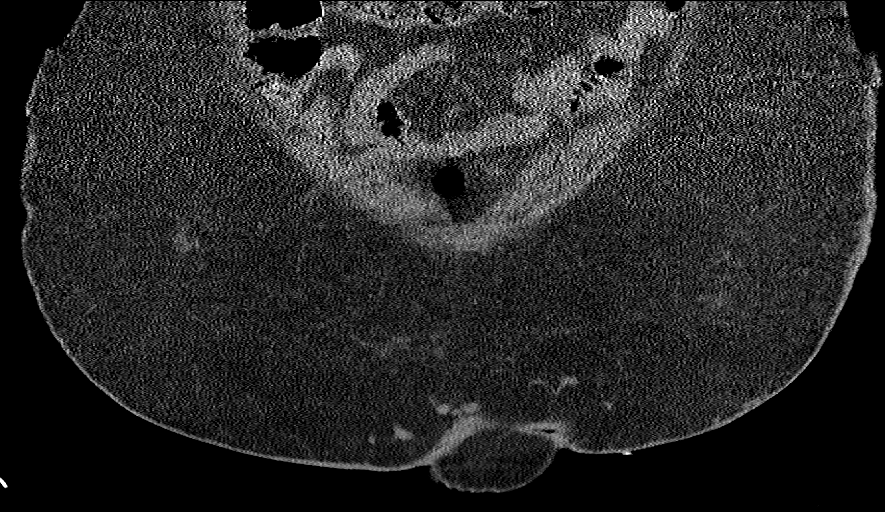
[im 96/191  soft-tissue]
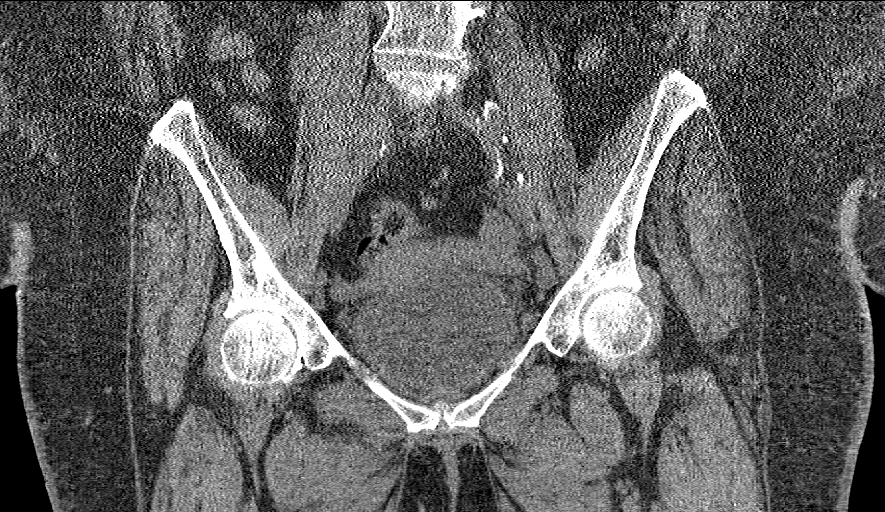
[im 143/191  soft-tissue]
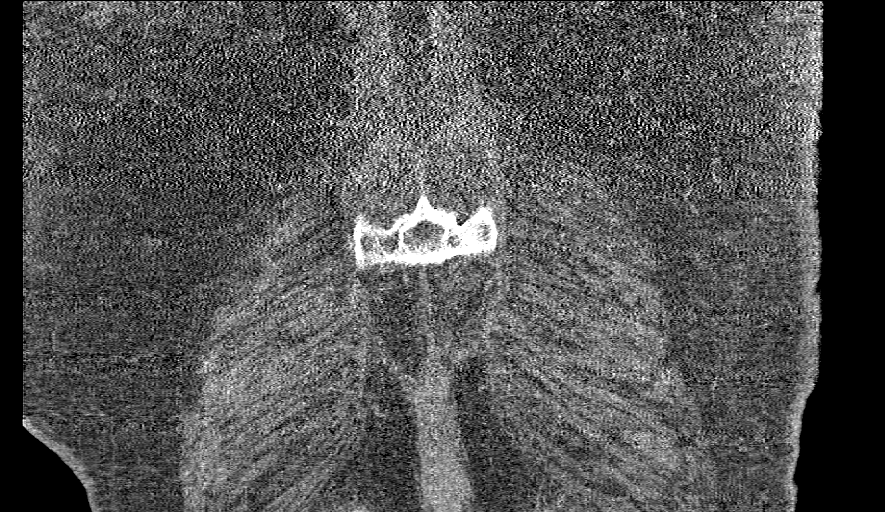

[15 of 46 positions shown; findings below may reference images not displayed]

FINDINGS: Urinary Tract: Asymmetrical wall thickening of the bladder. This may
represent cystitis or infiltrating bladder wall lesion. Diffuse
bladder wall thickening was seen previously although the bladder was
more decompressed. Consider cystoscopy for further evaluation.

Bowel: Visualized portions of the large and small bowel are not
abnormally distended. No inflammatory changes are appreciated.

Vascular/Lymphatic: Prominent lymph nodes in the pelvic changes
bilaterally, more prominent than previous study. Consider reactive
versus metastatic process.

Reproductive: Uterus appears moderately prominent for age.
Suggestion of enlargement of the left ovary, although adjacent bowel
loops limit evaluation. Consider ultrasound pelvis for further
evaluation.

Other: No free air or free fluid in the abdomen. Diffuse edema in
the subcutaneous fatty tissues. Prominent soft tissue varices.

Musculoskeletal: Diffuse bone demineralization. No acute fractures
are demonstrated. There is an expansile lytic lesion involving the
sacrum, likely representing a metastatic bone lesion.
IMPRESSION: 1. No acute fracture or dislocation.
2. Lytic expansile soft tissue lesion in the sacrum likely
representing metastatic disease.
3. Increasing lymphadenopathy in the iliac chains bilaterally,
suspicious for metastatic disease.
4. Asymmetric wall thickening in the bladder could represent
infiltrating neoplasm. There is also suggestion of left adnexal
prominence, poorly demonstrated because of bowel loops. An adnexal
mass is not excluded. Consider ultrasound correlation.

## 2020-11-20 NOTE — ED Triage Notes (Signed)
P brought in by EMS from [facility] for an unwitnessed fall. Pt fell form bed level trying to get into her walker. Pt reports no pain.

## 2020-11-20 NOTE — ED Notes (Signed)
Pt had soiled herself after getting back from x-ray. Pt was cleaned up and new brief placed by this RN and [place], Therapist, sports.

## 2020-11-20 NOTE — ED Notes (Signed)
Pt discharged back to [facility] by [facility] on a stretcher. Report attempted to facility with no answer.

## 2020-11-20 NOTE — Discharge Instructions (Addendum)
Follow-up with your provider team to review today's CT report and compared to prior diagnoses.

## 2020-11-20 NOTE — ED Provider Notes (Signed)
[facility] EMERGENCY DEPARTMENT Provider Note   CSN: [id number] Arrival date & time: 11/20/20  1906     History Chief Complaint  Patient presents with   Tonya Dougherty    Tonya Dougherty is a 70 y.o. female.  70 year old female with complaint of generalized body aches after a minor fall today. Patient was transferring from bed to wheelchair when the wheelchair slid and she slid out of the bed onto the floor.  Did not hit her head, did not lose consciousness.  States that someone came and was able to help get her back up into the wheelchair however when she went to [employer] with family for lunch today she was in a lot of pain (nonspecific, reportedly whole body) while walking with her walker. Patient is on Eliquis, history of bone cancer. No other complaints or concerns.       Past Medical History:  Diagnosis Date   Allergic rhinitis    Anxiety    Arthritis    Bell's palsy    Cataract    Cirrhosis (HCC)    Coarse tremors    Colon cancer ([place])    Diabetes mellitus without complication (HCC)    Diverticulosis    DVT (deep venous thrombosis) (HCC)    GERD (gastroesophageal reflux disease)    GI bleed    Hepatitis B    History of blood transfusion 05/2019   History of panic attacks    Hypertension    Insomnia    Iron deficiency anemia    MDD (major depressive disorder)    Morbid obesity (HCC)    Neuropathy    Portal vein thrombosis    Psoriasis    Renal insufficiency     Patient Active Problem List   Diagnosis Date Noted   Constipation 04/04/2020   Iron deficiency anemia 04/04/2020   Sacral lesion 03/26/2020   Bone metastasis ([place]) 03/25/2020   Acute cystitis without hematuria 03/25/2020   Essential hypertension 03/25/2020   GERD without esophagitis 03/25/2020   Mixed diabetic hyperlipidemia associated with type 2 diabetes mellitus ([place]) 03/25/2020   Nicotine dependence, cigarettes, uncomplicated [id number]   History of DVT (deep vein thrombosis)  03/25/2020   Type 2 diabetes mellitus with hyperglycemia, with long-term current use of insulin ([place]) 03/25/2020   Pressure injury of skin 03/25/2020   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     Family History  Problem Relation Age of Onset   Kidney failure Mother    Diabetes Mother    Obesity Mother    Hypertension Mother    Cirrhosis Father    Hypertension Sister    Kidney failure Brother    Hypertension Brother    Hypertension Sister    Alcoholism Brother    Other Brother        MRSA   Cirrhosis Daughter    Drug abuse Daughter     Social History   Tobacco Use   Smoking status: Every Day    Packs/day: 0.50    Types: Cigarettes  Smokeless tobacco: Never  Vaping Use   Vaping Use: Some days   Substances: Nicotine, Flavoring  Substance Use Topics   Alcohol use: No   Drug use: Not Currently    Home Medications Prior to Admission medications   Medication Sig Start Date End Date Taking? Authorizing Provider  acetaminophen (TYLENOL) 325 MG tablet Take 650 mg by mouth See admin instructions. '650mg'$  twice daily And '325mg'$  three times daily as needed for pain Patient not taking: Reported on 10/25/2020    [provider]  albuterol (VENTOLIN HFA) 108 (90 Base) MCG/ACT inhaler Inhale 2 puffs into the lungs See admin instructions. 2 puffs daily And 2 puffs daily as needed for shortness of breath 07/30/18 08/26/20  [provider]  Azelastine HCl 137 MCG/SPRAY SOLN Place 1 spray into both nostrils daily. 08/24/20    [provider]  B-D ULTRAFINE III SHORT PEN 31G X 8 MM MISC 1 each by Other route daily. For lantus 10/24/17   [provider]  Calcium Carb-Cholecalciferol (CALCIUM 600+D) 600-800 MG-UNIT TABS Take 2 tablets by mouth at bedtime.    [provider]  carboxymethylcellulose (REFRESH PLUS) 0.5 % SOLN Place 1 drop into both eyes 3 (three) times daily as needed (dry/irritated eyes).    [provider]  carvedilol (COREG) 3.125 MG tablet Take 3.125 mg by mouth 2 (two) times daily with a meal.    [provider]  cephALEXin (KEFLEX) 500 MG capsule Take 1 capsule (500 mg total) by mouth 3 (three) times daily. 08/26/20   Varney Biles, MD  citalopram (CELEXA) 10 MG tablet Take 30 mg by mouth at bedtime.     [provider]  diphenhydrAMINE (BENADRYL) 25 mg capsule Take 1 capsule (25 mg total) by mouth every 6 (six) hours as needed for itching. 04/14/20   Aline August, MD  ELIQUIS 5 MG TABS tablet TAKE 1 TABLET BY MOUTH TWICE A DAY Patient taking differently: Take 5 mg by mouth 2 (two) times daily. 03/10/20   Waynetta Sandy, MD  ferrous gluconate (FERGON) 324 MG tablet Take 324 mg by mouth 3 (three) times daily.    [provider]  furosemide (LASIX) 20 MG tablet Take 1 tablet (20 mg total) by mouth daily. Patient taking differently: Take 10 mg by mouth daily. 04/14/20   Aline August, MD  gabapentin (NEURONTIN) 300 MG capsule Take 2 capsules (600 mg total) by mouth 3 (three) times daily. 04/14/20   Aline August, MD  HYDROcodone-acetaminophen (NORCO/VICODIN) 5-325 MG tablet Take 1 tablet by mouth every 12 (twelve) hours. 05/23/20   [provider]  hydrOXYzine (ATARAX/VISTARIL) 10 MG tablet Take 10 mg by mouth 2 (two) times daily as needed for itching. 07/25/20   [provider]  Insulin Glargine (BASAGLAR KWIKPEN) 100 UNIT/ML SOPN Inject 27 Units into the skin at bedtime. 10/11/17   [provider]   Lidocaine HCl-Benzyl Alcohol (SALONPAS LIDOCAINE PLUS) 4-10 % CREA Apply topically. Pain relief Patch apply 9a apply patch to Lower back and shoulder ( apply in morning, remove in Evening)    [provider]  NOVOLOG 100 UNIT/ML injection Inject 0-8 Units into the skin daily as needed for other. Check daily at 0730am 0-200 0 units 201-250 2 units 251-300 4 units 301-350 6 units 351-400 8 units > 400 call MD 07/25/20   [provider]  nystatin (MYCOSTATIN) 100000 UNIT/ML suspension Take 5 mLs by mouth 4 (four) times daily.    [provider]  omeprazole ([place])  20 MG capsule Take 20 mg by mouth at bedtime.    [provider]  polyethylene glycol powder (GLYCOLAX/MIRALAX) 17 GM/SCOOP powder Take 17 g by mouth in the morning and at bedtime. Patient taking differently: Take 17 g by mouth daily. 04/14/20   Aline August, MD  senna-docusate (SENOKOT-S) 8.6-50 MG tablet Take 1 tablet by mouth 2 (two) times daily as needed for moderate constipation. Patient taking differently: Take 1 tablet by mouth See admin instructions. 1 tablet daily And 1 tablet twice daily as needed for moderate constipation 04/14/20   Aline August, MD  sodium chloride (OCEAN) 0.65 % SOLN nasal spray Place 2 sprays into both nostrils daily.    [provider]    Allergies    Xarelto [rivaroxaban], Erythromycin, Lipitor [atorvastatin], Pravachol [pravastatin], and Ivp dye [iodinated diagnostic agents]  Review of Systems   Review of Systems  Constitutional:  Negative for fever.  Respiratory:  Negative for shortness of breath.   Cardiovascular:  Negative for chest pain.  Gastrointestinal:  Negative for abdominal pain.  Musculoskeletal:  Positive for gait problem and myalgias. Negative for arthralgias, back pain, joint swelling, neck pain and neck stiffness.  Skin:  Negative for rash and wound.  Allergic/Immunologic: Positive for immunocompromised state.  Neurological:  Negative  for weakness and numbness.  Hematological:  Bruises/bleeds easily.  Psychiatric/Behavioral:  Negative for confusion.   All other systems reviewed and are negative.  Physical Exam Updated Vital Signs BP 132/77   Pulse 82   Temp 98.6 F (37 C) (Oral)   Resp (!) 21   Ht '5\' 4"'$  (1.626 m)   Wt 103 kg   SpO2 94%   BMI 38.98 kg/m   Physical Exam Vitals and nursing note reviewed.  Constitutional:      General: She is not in acute distress.    Appearance: She is well-developed. She is not diaphoretic.  HENT:     Head: Normocephalic and atraumatic.  Eyes:     Extraocular Movements: Extraocular movements intact.     Pupils: Pupils are equal, round, and reactive to light.  Cardiovascular:     Rate and Rhythm: Normal rate and regular rhythm.     Heart sounds: Normal heart sounds.  Pulmonary:     Effort: Pulmonary effort is normal.     Breath sounds: Normal breath sounds.  Abdominal:     Palpations: Abdomen is soft.     Tenderness: There is no abdominal tenderness.  Musculoskeletal:        General: No swelling, tenderness or deformity.     Cervical back: Normal range of motion and neck supple. No tenderness or bony tenderness.     Thoracic back: No tenderness or bony tenderness.     Lumbar back: No tenderness or bony tenderness.  Skin:    General: Skin is warm and dry.     Findings: No erythema or rash.  Neurological:     Mental Status: She is alert and oriented to person, place, and time.     Sensory: No sensory deficit.     Motor: No weakness.  Psychiatric:        Behavior: Behavior normal.    ED Results / Procedures / Treatments   Labs (all labs ordered are listed, but only abnormal results are displayed) Labs Reviewed - No data to display  EKG None  Radiology DG Lumbar Spine Complete  Result Date: 11/20/2020 CLINICAL DATA:  Fall, back pain EXAM: LUMBAR SPINE - COMPLETE 4+ VIEW; PELVIS - 1-2  VIEW COMPARISON:  CT abdomen pelvis 08/05/2020, MR lumbar spine 03/25/2020  FINDINGS: Radiographic assessment is limited by levocurvature of the spine and diffuse bony demineralization which may limit detection of subtle nondisplaced fractures. Levocurvature is centered at an apex of L2-3. No acute traumatic listhesis. No clear vertebral body fracture or height loss. Stepwise likely degenerative retrolisthesis noted L2-L4 with minimal grade 1 anterolisthesis L4 on L5 of approximately 5 mm. No discernible spondylolysis. Background of diffuse discogenic and facet degenerative changes, maximal towards the lower lumbar levels. Vascular calcium noted in the abdominal aorta without aneurysm or ectasia. Conspicuous through the neck of the left femur, while this may be projectional, a transcervical femoral fracture is not fully excluded. Remaining bones of the pelvis appear intact and congruent. Degenerative changes noted in the SI joints, symphysis pubis and bilateral hips. Soft tissues are noncontributory. IMPRESSION: Lumbar spine: 1. Diffuse bony demineralization, may limit detection of subtle osseous injury. 2. No clear acute fracture or traumatic listhesis of the lumbar spine. 3. Lumbar levocurvature, apex L2-3. 4. Multilevel discogenic and facet degenerative changes, maximal towards the lower lumbar levels. Pelvis: 1. Conspicuous lucency through the left femoral neck, could reflect a transcervical femoral neck fracture versus projectional anomaly. Could consider further interrogation with dedicated views or cross-sectional imaging if there is clinical concern. 2. Remaining bones of the pelvis and proximal right femur appear intact. 3. Degenerative changes of the hips and pelvis as above. Electronically Signed   By: Lovena Le M.D.   On: 11/20/2020 20:15   DG Pelvis 1-2 Views  Result Date: 11/20/2020 CLINICAL DATA:  Fall, back pain EXAM: LUMBAR SPINE - COMPLETE 4+ VIEW; PELVIS - 1-2 VIEW COMPARISON:  CT abdomen pelvis 08/05/2020, MR lumbar spine 03/25/2020 FINDINGS: Radiographic  assessment is limited by levocurvature of the spine and diffuse bony demineralization which may limit detection of subtle nondisplaced fractures. Levocurvature is centered at an apex of L2-3. No acute traumatic listhesis. No clear vertebral body fracture or height loss. Stepwise likely degenerative retrolisthesis noted L2-L4 with minimal grade 1 anterolisthesis L4 on L5 of approximately 5 mm. No discernible spondylolysis. Background of diffuse discogenic and facet degenerative changes, maximal towards the lower lumbar levels. Vascular calcium noted in the abdominal aorta without aneurysm or ectasia. Conspicuous through the neck of the left femur, while this may be projectional, a transcervical femoral fracture is not fully excluded. Remaining bones of the pelvis appear intact and congruent. Degenerative changes noted in the SI joints, symphysis pubis and bilateral hips. Soft tissues are noncontributory. IMPRESSION: Lumbar spine: 1. Diffuse bony demineralization, may limit detection of subtle osseous injury. 2. No clear acute fracture or traumatic listhesis of the lumbar spine. 3. Lumbar levocurvature, apex L2-3. 4. Multilevel discogenic and facet degenerative changes, maximal towards the lower lumbar levels. Pelvis: 1. Conspicuous lucency through the left femoral neck, could reflect a transcervical femoral neck fracture versus projectional anomaly. Could consider further interrogation with dedicated views or cross-sectional imaging if there is clinical concern. 2. Remaining bones of the pelvis and proximal right femur appear intact. 3. Degenerative changes of the hips and pelvis as above. Electronically Signed   By: Lovena Le M.D.   On: 11/20/2020 20:15   CT PELVIS WO CONTRAST  Result Date: 11/20/2020 CLINICAL DATA:  Unwitnessed fall. EXAM: CT PELVIS WITHOUT CONTRAST TECHNIQUE: Multidetector CT imaging of the pelvis was performed following the standard protocol without intravenous contrast. COMPARISON:   Pelvis 11/20/2020.  CT abdomen and pelvis 08/05/2020 FINDINGS: Urinary Tract: Asymmetrical wall thickening of  the bladder. This may represent cystitis or infiltrating bladder wall lesion. Diffuse bladder wall thickening was seen previously although the bladder was more decompressed. Consider cystoscopy for further evaluation. Bowel: Visualized portions of the large and small bowel are not abnormally distended. No inflammatory changes are appreciated. Vascular/Lymphatic: Prominent lymph nodes in the pelvic changes bilaterally, more prominent than previous study. Consider reactive versus metastatic process. Reproductive: Uterus appears moderately prominent for age. Suggestion of enlargement of the left ovary, although adjacent bowel loops limit evaluation. Consider ultrasound pelvis for further evaluation. Other: No free air or free fluid in the abdomen. Diffuse edema in the subcutaneous fatty tissues. Prominent soft tissue varices. Musculoskeletal: Diffuse bone demineralization. No acute fractures are demonstrated. There is an expansile lytic lesion involving the sacrum, likely representing a metastatic bone lesion. IMPRESSION: 1. No acute fracture or dislocation. 2. Lytic expansile soft tissue lesion in the sacrum likely representing metastatic disease. 3. Increasing lymphadenopathy in the iliac chains bilaterally, suspicious for metastatic disease. 4. Asymmetric wall thickening in the bladder could represent infiltrating neoplasm. There is also suggestion of left adnexal prominence, poorly demonstrated because of bowel loops. An adnexal mass is not excluded. Consider ultrasound correlation. Electronically Signed   By: Lucienne Capers M.D.   On: 11/20/2020 21:49    Procedures Procedures   Medications Ordered in ED Medications - No data to display  ED Course  I have reviewed the triage vital signs and the nursing notes.  Pertinent labs & imaging results that were available during my care of the patient  were reviewed by me and considered in my medical decision making (see chart for details).  Clinical Course as of 11/20/20 2200  Sun Nov 20, 4681  282 70 year old female presents after a fall today where she slid out of bed and landed on the floor without significant injury however she has been generally achy and had difficulty walking this evening which prompted her to seek evaluation.  No specific injury on exam.  X-ray of the lumbar spine and pelvis ordered due to mechanism of injury and was followed by CT.  Patient has known metastatic cancer.  In reviewing her care team's notes, there do not appear to be any new findings on this report however patient is encouraged to follow-up with her care team. [LM]    Clinical Course User Index [LM] Roque Lias   MDM Rules/Calculators/A&P                            Final Clinical Impression(s) / ED Diagnoses Final diagnoses:  Fall, initial encounter  Arthralgia, unspecified joint    Rx / DC Orders ED Discharge Orders     None        Roque Lias 11/20/20 2200    Luna Fuse, MD 11/22/20 1014

## 2020-11-20 NOTE — ED Notes (Signed)
Called [facility] to transport patient back to [facility].

## 2020-12-12 ENCOUNTER — Telehealth: Payer: Self-pay | Admitting: *Deleted

## 2020-12-12 NOTE — Telephone Encounter (Signed)
Per patient request, scheduled her lab/CT/OV on 12/27/20 beginning with lab at 0930. She already has her oral contrast and will drink at 0800 and 0900. Attempted x 3 to reach out to nursing station without success. Notified managed care to work on [place].

## 2020-12-12 NOTE — Telephone Encounter (Signed)
Spoke w/nurse and provided all appointment information for lab/CT/MD visit on 12/27/20. NPO after 6 am and needs to drink her contrast at 0800 and 0900. Lab is at 0930. Also provided office address and this RN's direct number to call with any questions.

## 2020-12-26 ENCOUNTER — Other Ambulatory Visit (HOSPITAL_BASED_OUTPATIENT_CLINIC_OR_DEPARTMENT_OTHER): Payer: Medicare HMO

## 2020-12-26 NOTE — Progress Notes (Signed)
[facility] OFFICE PROGRESS NOTE   Diagnosis: Colon cancer  INTERVAL HISTORY:   [name] returns as scheduled.  She is here with her sister.  She continues to live in the skilled nursing facility.  She is in her room with her other sister.  Good appetite.  She continues to have pain at the left sacral area.  She takes hydrocodone twice daily.  She reports partial relief of pain with the hydrocodone.  She would like a stronger pain medication.  No pain in other sites.  Objective:  Vital signs in last 24 hours:  Blood pressure (!) 159/75, pulse 73, temperature 97.8 F (36.6 C), temperature source Oral, resp. rate 18, height '5\' 4"'  (1.626 m), weight 223 lb 12.8 oz (101.5 kg), SpO2 100 %.    Lymphatics: No cervical, supraclavicular, axillary, or inguinal nodes Resp: Lungs clear bilaterally Cardio: Regular rate and rhythm GI: No hepatosplenomegaly, no mass Vascular: No leg edema Ace wrap in place at the left lower leg Musculoskeletal: Mild tenderness at the left sacrum and ischium, no mass    Lab Results:  Lab Results  Component Value Date   WBC 4.5 12/27/2020   HGB 11.6 (L) 12/27/2020   HCT 37.2 12/27/2020   MCV 99.2 12/27/2020   PLT 132 (L) 12/27/2020   NEUTROABS 3.0 12/27/2020    CMP  Lab Results  Component Value Date   NA 141 08/26/2020   K 4.0 08/26/2020   CL 107 08/26/2020   CO2 26 08/26/2020   GLUCOSE 102 (H) 08/26/2020   BUN 21 08/26/2020   CREATININE 0.82 08/26/2020   CALCIUM 9.3 08/26/2020   PROT 7.1 08/26/2020   ALBUMIN 3.1 (L) 08/26/2020   AST 29 08/26/2020   ALT 15 08/26/2020   ALKPHOS 118 08/26/2020   BILITOT 1.0 08/26/2020   GFRNONAA >60 08/26/2020   GFRAA >60 08/16/2019    Lab Results  Component Value Date   CEA1 33.28 (H) 10/25/2020   CEA 41.56 (H) 12/27/2020     Imaging:  No results found.  Medications: I have reviewed the patient's current medications.   Assessment/Plan: 1.Metastatic colon cancer, pT3, pN0  adenocarcinoma of the sigmoid colon diagnosed April 2021, status post sigmoidectomy 08/14/2019 - MSI Stable  Left sacral mass consistent with colorectal primary -MRI of the thoracic and lumbar spine 03/24/2020 -abnormal T2/STIR hyperintense lesion involving the central and left aspect of the sacrum concerning for osseous metastatic disease, probable invasion of adjacent left S1-S2 neural foramina, extraosseous extension into the adjacent presacral space -CT chest/abdomen/pelvis 03/25/2020-destructive left sacral lesion, associated 2.8 x 3.5 cm soft tissue component anterior to the left sacrum worrisome for metastasis, 2.6 cm hypoenhancing mass in the right upper kidney-?  Metastasis versus renal cell carcinoma -CT-guided biopsy of left sacral mass 03/28/2020-metastatic adenocarcinoma consistent with GI primary, MSS, tumor mutation burden 4, IDH 1, K-ras wild-type -Radiation to left sacral mass 04/01/2020-04/14/2020 -CTs 08/05/2020-similar size and appearance of left presacral soft tissue metastasis and lytic lesion within the left sacrum.  No progressive metastatic disease identified.  Previously identified indeterminate lesion in the interpolar region of the right kidney not well seen but has not clearly resolved.  No bladder wall thickening.  Mildly increased size of small mediastinal lymph nodes.  Small retroperitoneal pelvic lymph nodes unchanged.  Stable additional incidental findings including morphologic changes of cirrhosis, splenomegaly, cholelithiasis and aortic atherosclerosis.  Heterogeneous enlargement of the thyroid gland with multiple calcifications unchanged from previous CT examinations. 2.  History of iron deficiency anemia  3.  Cirrhosis with splenomegaly 5.  Left lower extremity DVT diagnosed 2019 6.  Hypertension 7.  Diabetes mellitus 8.  Benign essential tremor 9.  CKD 10.  Hyperlipidemia 11.  Morbid obesity 12.  Anxiety and depression 13.  Tobacco dependence 14.  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  Pain secondary to #1, improved following palliative radiation 16.  Right renal mass on CT 03/25/2020; previously identified indeterminate lesion in the interpolar region of the right kidney not well seen but not clearly resolved on CT 08/05/2020. 17.  Anemia-progressive 08/08/2020.  Ferritin added to labs.  Made request to facility to complete stool Hemoccult cards x3 and repeat CBC in 2 weeks.  Hemoglobin improved 09/16/2020. 18.  Mild leukopenia and thrombocytopenia-question related to cirrhosis     Disposition: Tonya Dougherty has metastatic colon cancer.  She continues to have intermittent discomfort from a metastasis at the left sacrum.  We adjusted the narcotic regimen today.  She underwent restaging CTs today.  The final report is not available.  I reviewed the CT images with Tonya Dougherty.  There is no apparent change in the left sacral mass or small lung nodules.  The plan is to continue observation.  We will contact her when the final CT report is available.  Tonya Dougherty returns for an office visit in 2 months.  Betsy Coder, MD  12/27/2020  2:09 PM

## 2020-12-27 ENCOUNTER — Inpatient Hospital Stay: Payer: Medicare HMO

## 2020-12-27 ENCOUNTER — Inpatient Hospital Stay: Payer: Medicare HMO | Attending: Oncology | Admitting: Oncology

## 2020-12-27 ENCOUNTER — Ambulatory Visit (HOSPITAL_BASED_OUTPATIENT_CLINIC_OR_DEPARTMENT_OTHER)
Admission: RE | Admit: 2020-12-27 | Discharge: 2020-12-27 | Disposition: A | Discharge status: Home / Self Care | Payer: Medicare HMO | Source: Ambulatory Visit | Attending: Oncology | Admitting: Oncology

## 2020-12-27 ENCOUNTER — Other Ambulatory Visit: Payer: Self-pay | Admitting: *Deleted

## 2020-12-27 ENCOUNTER — Other Ambulatory Visit: Payer: Self-pay

## 2020-12-27 VITALS — BP 159/75 | HR 73 | Temp 97.8°F | Resp 18 | Ht 64.0 in | Wt 223.8 lb

## 2020-12-27 DIAGNOSIS — C187 Malignant neoplasm of sigmoid colon: Secondary | ICD-10-CM

## 2020-12-27 DIAGNOSIS — G250 Essential tremor: Secondary | ICD-10-CM | POA: Insufficient documentation

## 2020-12-27 DIAGNOSIS — I129 Hypertensive chronic kidney disease with stage 1 through stage 4 chronic kidney disease, or unspecified chronic kidney disease: Secondary | ICD-10-CM | POA: Insufficient documentation

## 2020-12-27 DIAGNOSIS — F419 Anxiety disorder, unspecified: Secondary | ICD-10-CM | POA: Diagnosis not present

## 2020-12-27 DIAGNOSIS — N189 Chronic kidney disease, unspecified: Secondary | ICD-10-CM | POA: Insufficient documentation

## 2020-12-27 DIAGNOSIS — E1122 Type 2 diabetes mellitus with diabetic chronic kidney disease: Secondary | ICD-10-CM | POA: Insufficient documentation

## 2020-12-27 DIAGNOSIS — R161 Splenomegaly, not elsewhere classified: Secondary | ICD-10-CM | POA: Diagnosis not present

## 2020-12-27 DIAGNOSIS — E785 Hyperlipidemia, unspecified: Secondary | ICD-10-CM | POA: Diagnosis not present

## 2020-12-27 DIAGNOSIS — K7460 Unspecified cirrhosis of liver: Secondary | ICD-10-CM | POA: Diagnosis not present

## 2020-12-27 DIAGNOSIS — F32A Depression, unspecified: Secondary | ICD-10-CM | POA: Insufficient documentation

## 2020-12-27 DIAGNOSIS — Z79899 Other long term (current) drug therapy: Secondary | ICD-10-CM | POA: Diagnosis not present

## 2020-12-27 LAB — CEA (ACCESS): CEA (CHCC): 41.56 ng/mL — ABNORMAL HIGH (ref 0.00–5.00)

## 2020-12-27 LAB — CBC WITH DIFFERENTIAL (CANCER CENTER ONLY)
Abs Immature Granulocytes: 0.01 10*3/uL (ref 0.00–0.07)
Basophils Absolute: 0 10*3/uL (ref 0.0–0.1)
Basophils Relative: 0 %
Eosinophils Absolute: 0.2 10*3/uL (ref 0.0–0.5)
Eosinophils Relative: 4 %
HCT: 37.2 % (ref 36.0–46.0)
Hemoglobin: 11.6 g/dL — ABNORMAL LOW (ref 12.0–15.0)
Immature Granulocytes: 0 %
Lymphocytes Relative: 19 %
Lymphs Abs: 0.9 10*3/uL (ref 0.7–4.0)
MCH: 30.9 pg (ref 26.0–34.0)
MCHC: 31.2 g/dL (ref 30.0–36.0)
MCV: 99.2 fL (ref 80.0–100.0)
Monocytes Absolute: 0.4 10*3/uL (ref 0.1–1.0)
Monocytes Relative: 10 %
Neutro Abs: 3 10*3/uL (ref 1.7–7.7)
Neutrophils Relative %: 67 %
Platelet Count: 132 10*3/uL — ABNORMAL LOW (ref 150–400)
RBC: 3.75 MIL/uL — ABNORMAL LOW (ref 3.87–5.11)
RDW: 15.3 % (ref 11.5–15.5)
WBC Count: 4.5 10*3/uL (ref 4.0–10.5)
nRBC: 0 % (ref 0.0–0.2)

## 2020-12-27 LAB — CEA (IN HOUSE-CHCC): CEA (CHCC-In House): 39.76 ng/mL — ABNORMAL HIGH (ref 0.00–5.00)

## 2020-12-27 IMAGING — CT CT CHEST-ABD-PELV W/O CM
2 of 4 series · 12 of 46 positions shown, 14 images · non-contrast
Comparison: CT pelvis dated [DATE]. CT chest abdomen pelvis
dated [DATE].
COMPARISON: CT pelvis dated [DATE]. CT chest abdomen pelvis
dated [DATE].

Addendum:
CLINICAL DATA: Restaging sigmoid colon cancer.  Cirrhosis.

EXAM:
CT CHEST, ABDOMEN AND PELVIS WITHOUT CONTRAST
TECHNIQUE: Multidetector CT imaging of the chest, abdomen and pelvis was
performed following the standard protocol without IV contrast.

[Series 2: cap without · axial · non-contrast · 0.98mm/px · z∈[+958,+1508]mm · 9 of 128 slices shown, 11 images]
[im 9/128  soft-tissue]
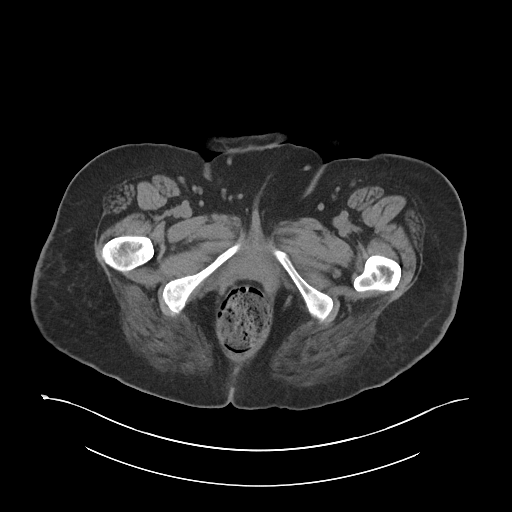
[im 9/128  bone]
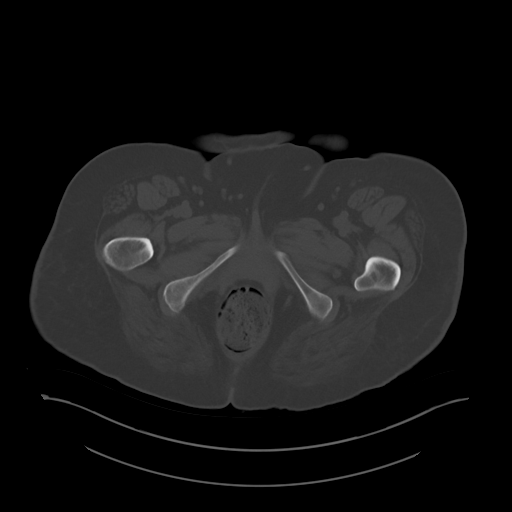
[im 26/128  soft-tissue]
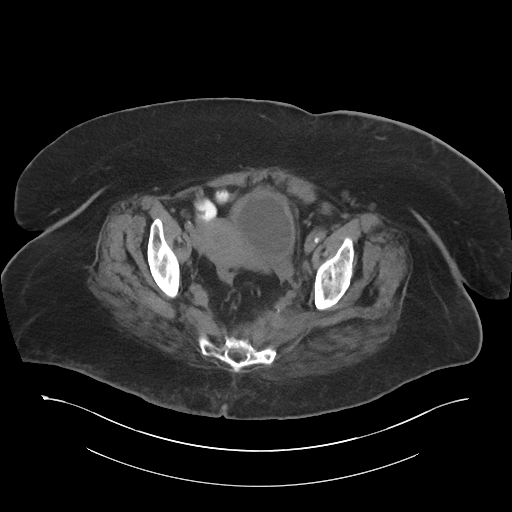
[im 34/128  soft-tissue]
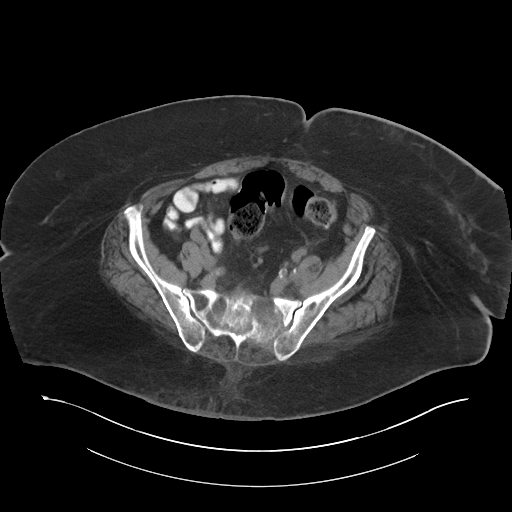
[im 51/128  soft-tissue]
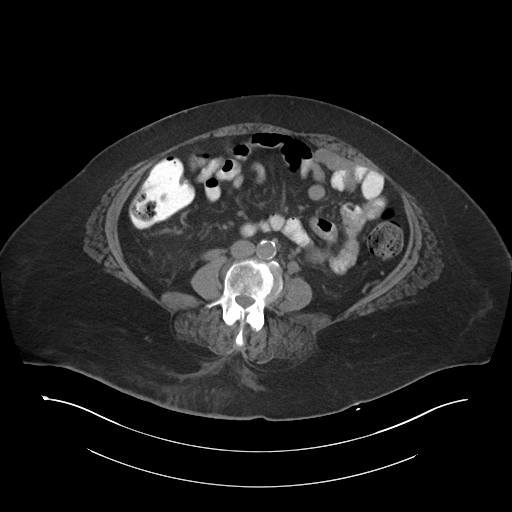
[im 68/128  soft-tissue]
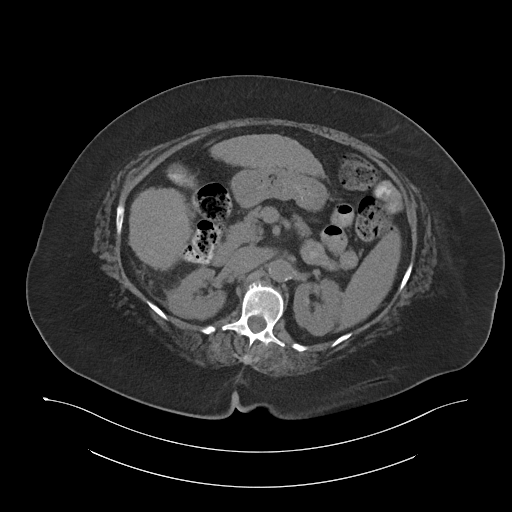
[im 77/128  soft-tissue]
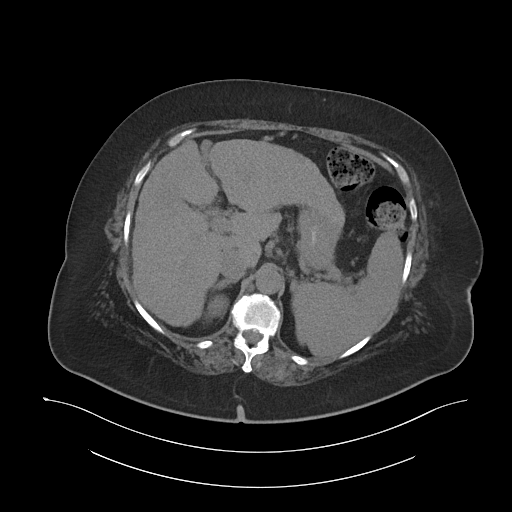
[im 94/128  soft-tissue]
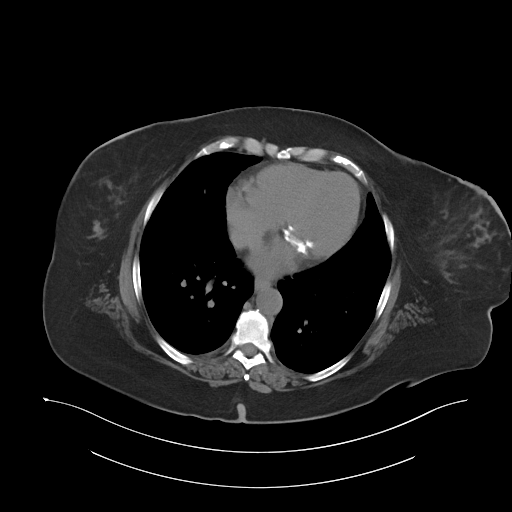
[im 102/128  soft-tissue]
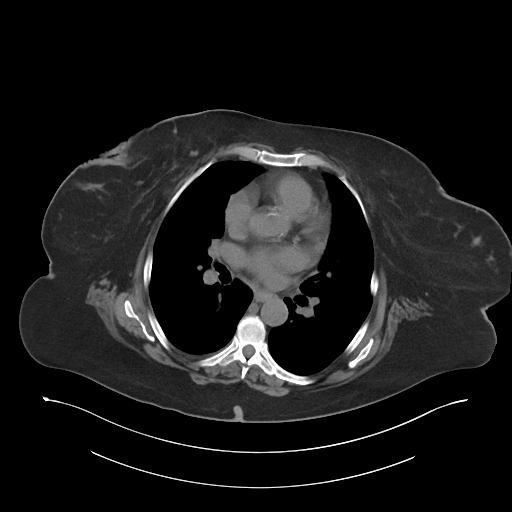
[im 119/128  soft-tissue]
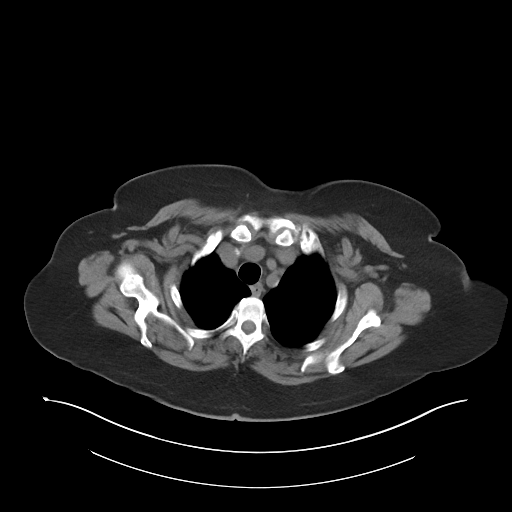
[im 119/128  bone]
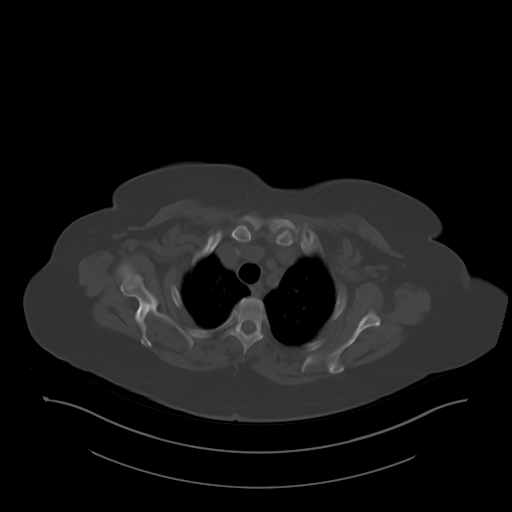

[Series 5: coronal · coronal · 1.00mm/px · 3 of 119 slices shown]
[im 40/119  soft-tissue]
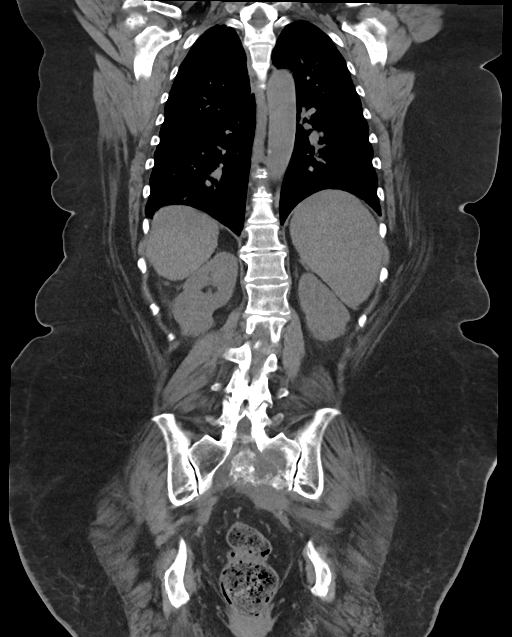
[im 53/119  soft-tissue]
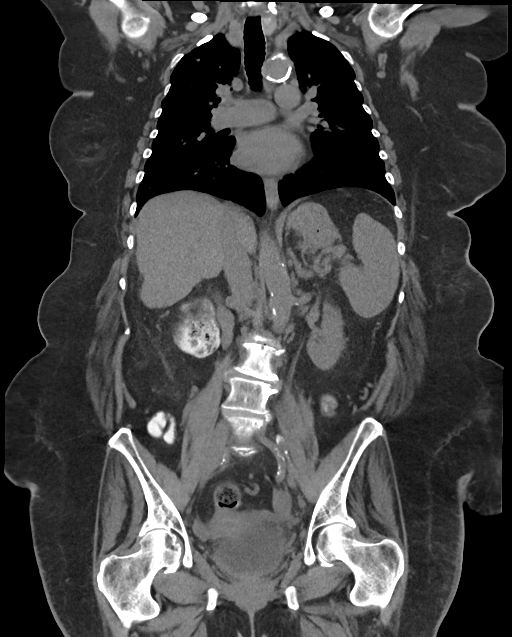
[im 66/119  soft-tissue]
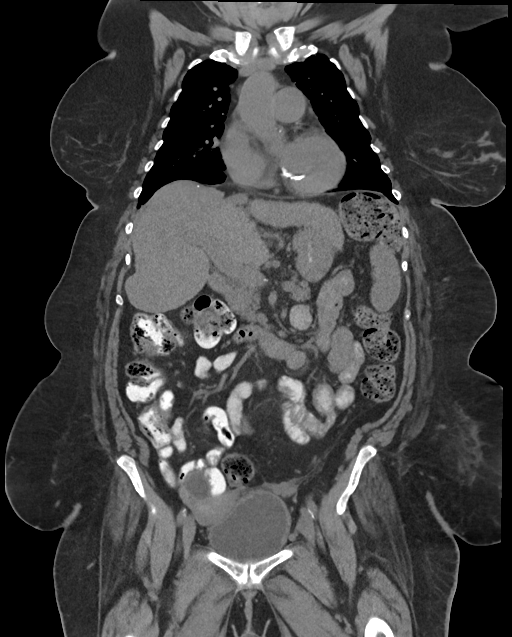

[12 of 46 positions shown; findings below may reference images not displayed]

FINDINGS: CT CHEST FINDINGS

Cardiovascular: Heart is normal in size.  No pericardial effusion.

No evidence of thoracic aortic aneurysm. Atherosclerotic
calcifications of the aortic arch.

Coronary atherosclerosis of the LAD and right coronary artery.

Mediastinum/Nodes: No suspicious mediastinal lymphadenopathy.

Enlarged, heterogeneous thyroid with a 14 mm calcified nodule on the
left (series 2/image 4). Not clinically significant; no follow-up
imaging recommended (ref: [HOSPITAL]. [DATE]): 143-50).

Lungs/Pleura: Multiple bilateral pulmonary nodules, suspicious for
metastatic disease. Dominant nodules include:

--8 mm nodule in the anterior right upper lobe (series 4/image 31)

--10 mm nodule in the left upper lobe (series 4/image 60)

--9 mm nodule in the right middle lobe (series 4/image 83)

--7 mm nodule in the right lower lobe (series 4/image 87)

Scarring in the anterior right upper lobe.

No focal consolidation.

No pleural effusion or pneumothorax.

Musculoskeletal: No focal osseous lesions.

CT ABDOMEN PELVIS FINDINGS

Hepatobiliary: Cirrhosis.

Numerous layering gallstones (series 2/image 87), without associated
inflammatory changes. No intrahepatic or extrahepatic ductal
dilatation.

Pancreas: Within normal limits.

Spleen: At the upper limits of normal for size, measuring 12.9 cm in
maximal craniocaudal dimension.

Adrenals/Urinary Tract: Adrenal glands are within normal limits.

Kidneys are within normal limits. No renal, ureteral, or bladder
calculi. No hydronephrosis.

Thick-walled bladder.

Stomach/Bowel: Stomach is within normal limits.

No evidence of bowel obstruction.

Normal appendix (series 2/image 85).

Status post left hemicolectomy with suture line in the lower pelvis
(series 2/image 95). No colonic mass is evident on CT.

Vascular/Lymphatic: No evidence of abdominal aortic aneurysm.

Atherosclerotic calcifications of the abdominal aorta and branch
vessels.

Left presacral soft tissue mass measures 3.1 x 3.8 cm (series
2/image 101), previously 3.0 x 3.9 cm, grossly unchanged.

Small bilateral iliac chain nodes measuring 7-8 mm short axis
(series 2/images 82, 91, 101, and 108), indeterminate.

Reproductive: Uterus is within normal limits. Bilateral ovaries are
grossly unremarkable.

Other: No abdominopelvic ascites.

Musculoskeletal: Sacral metastasis (series 2/image 98) extending
into the left S1 foramen (series 2/image 94) with left presacral
soft tissue (described above), grossly unchanged.

Mild degenerative changes of the lumbar spine.
IMPRESSION: Status post left hemicolectomy.

Multiple bilateral pulmonary nodules measuring up to 10 mm in the
right upper lobe, suspicious for metastatic disease.

Stable sacral metastasis with left presacral soft tissue mass. Small
pelvic nodes remain indeterminate.

Additional ancillary findings as above.

ADDENDUM:
Pulmonary nodule comparison to prior requested.

Index nodules (noted in original report) are as follows:

--8 mm right upper lobe nodule previously measured 2 mm.

--10 mm left upper lobe nodule previously measured 6 mm.

--9 mm right middle lobe nodule previously measured 4 mm.

--7 mm right lower lobe nodule previously measured 2 mm.

Most of the smaller scattered pulmonary nodules (which were not
measured as index nodules) are new.

As noted, these findings are considered suspicious for metastatic
disease.

*** End of Addendum ***
FINDINGS: CT CHEST FINDINGS

Cardiovascular: Heart is normal in size.  No pericardial effusion.

No evidence of thoracic aortic aneurysm. Atherosclerotic
calcifications of the aortic arch.

Coronary atherosclerosis of the LAD and right coronary artery.

Mediastinum/Nodes: No suspicious mediastinal lymphadenopathy.

Enlarged, heterogeneous thyroid with a 14 mm calcified nodule on the
left (series 2/image 4). Not clinically significant; no follow-up
imaging recommended (ref: [HOSPITAL]. [DATE]): 143-50).

Lungs/Pleura: Multiple bilateral pulmonary nodules, suspicious for
metastatic disease. Dominant nodules include:

--8 mm nodule in the anterior right upper lobe (series 4/image 31)

--10 mm nodule in the left upper lobe (series 4/image 60)

--9 mm nodule in the right middle lobe (series 4/image 83)

--7 mm nodule in the right lower lobe (series 4/image 87)

Scarring in the anterior right upper lobe.

No focal consolidation.

No pleural effusion or pneumothorax.

Musculoskeletal: No focal osseous lesions.

CT ABDOMEN PELVIS FINDINGS

Hepatobiliary: Cirrhosis.

Numerous layering gallstones (series 2/image 87), without associated
inflammatory changes. No intrahepatic or extrahepatic ductal
dilatation.

Pancreas: Within normal limits.

Spleen: At the upper limits of normal for size, measuring 12.9 cm in
maximal craniocaudal dimension.

Adrenals/Urinary Tract: Adrenal glands are within normal limits.

Kidneys are within normal limits. No renal, ureteral, or bladder
calculi. No hydronephrosis.

Thick-walled bladder.

Stomach/Bowel: Stomach is within normal limits.

No evidence of bowel obstruction.

Normal appendix (series 2/image 85).

Status post left hemicolectomy with suture line in the lower pelvis
(series 2/image 95). No colonic mass is evident on CT.

Vascular/Lymphatic: No evidence of abdominal aortic aneurysm.

Atherosclerotic calcifications of the abdominal aorta and branch
vessels.

Left presacral soft tissue mass measures 3.1 x 3.8 cm (series
2/image 101), previously 3.0 x 3.9 cm, grossly unchanged.

Small bilateral iliac chain nodes measuring 7-8 mm short axis
(series 2/images 82, 91, 101, and 108), indeterminate.

Reproductive: Uterus is within normal limits. Bilateral ovaries are
grossly unremarkable.

Other: No abdominopelvic ascites.

Musculoskeletal: Sacral metastasis (series 2/image 98) extending
into the left S1 foramen (series 2/image 94) with left presacral
soft tissue (described above), grossly unchanged.

Mild degenerative changes of the lumbar spine.
IMPRESSION: Status post left hemicolectomy.

Multiple bilateral pulmonary nodules measuring up to 10 mm in the
right upper lobe, suspicious for metastatic disease.

Stable sacral metastasis with left presacral soft tissue mass. Small
pelvic nodes remain indeterminate.

Additional ancillary findings as above.

## 2020-12-27 NOTE — Progress Notes (Signed)
Faxed hydrocodone-apap 7.5/325 script to [facility] [phone]). Left VM requesting supervisor call back to confirm this is appropriate way to communicate new orders to SNF.

## 2020-12-30 ENCOUNTER — Telehealth: Payer: Self-pay

## 2020-12-30 NOTE — Telephone Encounter (Signed)
-----   Message from Ladell Pier, MD sent at 12/29/2020  9:13 PM EDT ----- Please call patient, final report from CT now available, sacral metastasis is stable, lung nodules are larger compared to CT from April  Follow-up as scheduled, we will plan for repeat chest CT in 2-3 months, if nodules are getting larger the plan is to begin systemic therapy

## 2020-12-30 NOTE — Telephone Encounter (Signed)
Relayed message to Pt about CT scan results per Dr Benay Spice. Discussed lung nodules are larger from previous scan in April and that Dr. Benay Spice wants to do another CT scan in 2-3 months to see if the nodules continue to get larger. And if the continue to get larger his plan is to begin systemic therapy. Pt verbalized understanding.

## 2021-02-27 ENCOUNTER — Inpatient Hospital Stay: Payer: Medicare HMO | Attending: Oncology | Admitting: Nurse Practitioner

## 2021-02-27 ENCOUNTER — Encounter: Payer: Self-pay | Admitting: Nurse Practitioner

## 2021-02-27 ENCOUNTER — Other Ambulatory Visit: Payer: Self-pay

## 2021-02-27 VITALS — BP 113/63 | HR 66 | Temp 97.8°F | Resp 18 | Ht 64.0 in | Wt 219.0 lb

## 2021-02-27 DIAGNOSIS — E1122 Type 2 diabetes mellitus with diabetic chronic kidney disease: Secondary | ICD-10-CM | POA: Insufficient documentation

## 2021-02-27 DIAGNOSIS — K7460 Unspecified cirrhosis of liver: Secondary | ICD-10-CM | POA: Insufficient documentation

## 2021-02-27 DIAGNOSIS — D696 Thrombocytopenia, unspecified: Secondary | ICD-10-CM | POA: Diagnosis not present

## 2021-02-27 DIAGNOSIS — C187 Malignant neoplasm of sigmoid colon: Secondary | ICD-10-CM | POA: Diagnosis present

## 2021-02-27 DIAGNOSIS — E785 Hyperlipidemia, unspecified: Secondary | ICD-10-CM | POA: Insufficient documentation

## 2021-02-27 DIAGNOSIS — I700 Atherosclerosis of aorta: Secondary | ICD-10-CM | POA: Insufficient documentation

## 2021-02-27 DIAGNOSIS — N189 Chronic kidney disease, unspecified: Secondary | ICD-10-CM | POA: Diagnosis not present

## 2021-02-27 DIAGNOSIS — F32A Depression, unspecified: Secondary | ICD-10-CM | POA: Diagnosis not present

## 2021-02-27 DIAGNOSIS — C7989 Secondary malignant neoplasm of other specified sites: Secondary | ICD-10-CM | POA: Insufficient documentation

## 2021-02-27 DIAGNOSIS — F419 Anxiety disorder, unspecified: Secondary | ICD-10-CM | POA: Diagnosis not present

## 2021-02-27 DIAGNOSIS — I129 Hypertensive chronic kidney disease with stage 1 through stage 4 chronic kidney disease, or unspecified chronic kidney disease: Secondary | ICD-10-CM | POA: Diagnosis not present

## 2021-02-27 DIAGNOSIS — G250 Essential tremor: Secondary | ICD-10-CM | POA: Insufficient documentation

## 2021-02-27 NOTE — Progress Notes (Signed)
[facility] OFFICE PROGRESS NOTE   Diagnosis: Colon cancer  INTERVAL HISTORY:   Tonya Dougherty returns as scheduled.  No fever, cough, shortness of breath.  No change in bowel habits.  Few episodes of mild nausea last week.  She occasionally notes blood in her urine.  She is followed by urology.  She reports a recent UTI.  Objective:  Vital signs in last 24 hours:  Blood pressure 113/63, pulse 66, temperature 97.8 F (36.6 C), temperature source Oral, resp. rate 18, height 5' 4" (1.626 m), weight 219 lb (99.3 kg), SpO2 98 %.    HEENT: Neck without mass. Lymphatics: No palpable cervical, supraclavicular or axillary lymph nodes. Resp: Lungs clear bilaterally. Cardio: Regular rate and rhythm. GI: Abdomen soft and nontender.  No hepatomegaly. Vascular: Trace pitting edema lower leg bilaterally left greater than right (chronic per patient report); bilateral stasis changes left greater than right.  Lab Results:  Lab Results  Component Value Date   WBC 4.5 12/27/2020   HGB 11.6 (L) 12/27/2020   HCT 37.2 12/27/2020   MCV 99.2 12/27/2020   PLT 132 (L) 12/27/2020   NEUTROABS 3.0 12/27/2020    Imaging:  No results found.  Medications: I have reviewed the patient's current medications.  Assessment/Plan: 1.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, status post sigmoidectomy 08/14/2019 - MSI Stable  Left sacral mass consistent with colorectal primary -MRI of the thoracic and lumbar spine 03/24/2020 -abnormal T2/STIR hyperintense lesion involving the central and left aspect of the sacrum concerning for osseous metastatic disease, probable invasion of adjacent left S1-S2 neural foramina, extraosseous extension into the adjacent presacral space -CT chest/abdomen/pelvis 03/25/2020-destructive left sacral lesion, associated 2.8 x 3.5 cm soft tissue component anterior to the left sacrum worrisome for metastasis, 2.6 cm  hypoenhancing mass in the right upper kidney-?  Metastasis versus renal cell carcinoma -CT-guided biopsy of left sacral mass 03/28/2020-metastatic adenocarcinoma consistent with GI primary, MSS, tumor mutation burden 4, IDH 1, K-ras wild-type -Radiation to left sacral mass 04/01/2020-04/14/2020 -CTs 08/05/2020-similar size and appearance of left presacral soft tissue metastasis and lytic lesion within the left sacrum.  No progressive metastatic disease identified.  Previously identified indeterminate lesion in the interpolar region of the right kidney not well seen but has not clearly resolved.  No bladder wall thickening.  Mildly increased size of small mediastinal lymph nodes.  Small retroperitoneal pelvic lymph nodes unchanged.  Stable additional incidental findings including morphologic changes of cirrhosis, splenomegaly, cholelithiasis and aortic atherosclerosis.  Heterogeneous enlargement of the thyroid gland with multiple calcifications unchanged from previous CT examinations. -CTs 12/27/2020-stable sacral metastasis with left presacral soft tissue mass.  Small pelvic nodes indeterminant.  Index lung nodules increased in size.  New smaller scattered pulmonary nodules. 2.  History of iron deficiency anemia 3.  Cirrhosis with splenomegaly 5.  Left lower extremity DVT diagnosed 2019 6.  Hypertension 7.  Diabetes mellitus 8.  Benign essential tremor 9.  CKD 10.  Hyperlipidemia 11.  Morbid obesity 12.  Anxiety and depression 13.  Tobacco dependence 14.  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  Pain secondary to #1, improved following palliative radiation 16.  Right renal mass on CT 03/25/2020; previously identified indeterminate lesion in the interpolar region of the right kidney not well seen but not clearly resolved on CT 08/05/2020. 17.  Anemia-progressive 08/08/2020.  Ferritin added to labs.  Made request to facility to complete stool Hemoccult cards x3 and repeat CBC in   2 weeks.   Hemoglobin improved 09/16/2020. 18.  Mild leukopenia and thrombocytopenia-question related to cirrhosis      Disposition: Tonya Dougherty appears unchanged.  The restaging CT scans from August show index lung nodules have increased in size and there are new lung nodules.  Dr. Benay Spice reviewed the CT report/images with Tonya Dougherty at today's visit.  She is asymptomatic.  Plan for next chest CT in approximately 2 months.  She agrees with this plan.  We will see her in follow-up a few days after the scan.  Patient seen with Dr. Benay Spice.    Ned Card ANP/GNP-BC   02/27/2021  11:43 AM This was a shared visit with Ned Card.  Tonya Dougherty has metastatic colon cancer.  We reviewed the 12/27/2020 CT images with her.  There is evidence of disease progression involving multiple small lung nodules.  She is asymptomatic other than persistent mild discomfort at the left sacrum.  She will return for a restaging chest CT in 2 months.  We will consider systemic treatment options when there is significant evidence of disease progression.  I was present for greater than 50% of today's visit.  I performed medical decision making.  Julieanne Manson, MD

## 2021-04-25 ENCOUNTER — Non-Acute Institutional Stay: Payer: Medicare HMO | Admitting: Hospice

## 2021-04-25 ENCOUNTER — Other Ambulatory Visit: Payer: Self-pay

## 2021-04-25 DIAGNOSIS — C187 Malignant neoplasm of sigmoid colon: Secondary | ICD-10-CM

## 2021-04-25 DIAGNOSIS — G894 Chronic pain syndrome: Secondary | ICD-10-CM

## 2021-04-25 DIAGNOSIS — F339 Major depressive disorder, recurrent, unspecified: Secondary | ICD-10-CM

## 2021-04-25 DIAGNOSIS — Z515 Encounter for palliative care: Secondary | ICD-10-CM

## 2021-04-25 NOTE — Progress Notes (Signed)
[facility] Palliative Care Consult Note Telephone: [phone]  Fax: [phone]  PATIENT NAME: Tonya Dougherty DOB: 10-Oct-1950 MRN: [id number]  PRIMARY CARE PROVIDER:   Gay Filler, MD Gay Filler, MD [place],  [place] [address]  REFERRING PROVIDER:   Dr. Jules Husbands   RESPONSIBLE PARTY:Self  HCPOA/Emergency Contact: Constance Haw Home Phone: [phone] Mobile Phone: [phone] Relation: Sister HCPOA Contact Information     Name Relation Home Work Mobile   [place] Friend [phone]    Cherene Julian [phone]  [phone]       Visit is to build trust and highlight Palliative Medicine as specialized medical care for people living with serious illness, aimed at facilitating better quality of life through symptoms relief, assisting with advance care planning and complex medical decision making. This is a Follow up visit  RECOMMENDATIONS/PLAN:   Advance Care Planning/Code Status: Patient is a full code  Goals of Care: Goals of care include to maximize quality of life and symptom management. MOST form Selections include attempt resuscitation CPR, full scope of treatment, antibiotics if indicated, IV fluids if indicated, feeding tube for defined trial.   Visit consisted of counseling and education dealing with the complex and emotionally intense issues of symptom management and palliative care in the setting of serious and potentially life-threatening illness. Palliative care team will continue to support patient, patient's family, and medical team.  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 Continue with surveillance follow up with Oncology, next visit 05/02/2021.  Chronic pain syndrome: multiple joints- shoulder, hip. Managed with Lidocaine patch, Hydrocodone.  Depression: Continue citalopram.   Psych consult as needed Follow up: Palliative care will continue to follow for complex medical decision making, advance care planning, and clarification of goals. Return 6 weeks or prn. Encouraged to call provider sooner with any concerns.  Follow up: Palliative care will continue to follow for complex medical decision making, advance care planning, and clarification of goals. Return 6 weeks or prn. Encouraged to call provider sooner with any concer CHIEF COMPLAINT: Palliative follow up visit  HISTORY OF PRESENT ILLNESS:  Tonya Dougherty a 70 y.o. female with multiple medical problems including adenocarcinoma of sigmoid colon s/p surgical removal and radiation,  major depressive disorder, hypotension, anxiety, COVID-19 infection.  Today, patient denies pain/discomfort. History obtained from review of EMR, discussion with primary team, family, caregiver  and/or patient. Records reviewed and summarized above. All 10 point systems reviewed and are negative except as documented in history of present illness above  Review and summarization of Epic records shows history from other than patient.   Palliative Care was asked to follow this patient by consultation request of Dr. Jules Husbands to help address complex decision making in the context of advance care planning and goals of care clarification.   PPS: 50%  Height/Weight Constitutional: NAD General: Well groomed, cooperative EYES: anicteric sclera, lids intact, no discharge  ENMT: Moist mucous membrane CV: S1 S2, RRR, no LE edema Pulmonary: LCTA, no increased work of breathing, no cough, Abdomen: active BS + 4 quadrants, soft and non-tender GU: no suprapubic tenderness MSK: weakness, sarcopenia, limited ROM Skin: warm and dry, no rashes or wounds on visible skin Neuro:  weakness, otherwise non focal Psych: non-anxious affect Hem/lymph/immuno: no widespread bruising   PERTINENT MEDICATIONS:  Outpatient Encounter Medications as of  04/25/2021  Medication Sig   acetaminophen (TYLENOL) 325 MG tablet  Take 650 mg by mouth See admin instructions. 650mg  twice daily And 325mg  three times daily as needed for pain   albuterol (VENTOLIN HFA) 108 (90 Base) MCG/ACT inhaler Inhale 2 puffs into the lungs daily as needed for wheezing or shortness of breath.   aluminum-magnesium hydroxide 200-200 MG/5ML suspension Take 30 mLs by mouth every 4 (four) hours as needed for indigestion.   amantadine (SYMMETREL) 100 MG capsule Take 100 mg by mouth 2 (two) times daily.   Azelastine HCl 137 MCG/SPRAY SOLN Place 1 spray into both nostrils daily.   B Complex Vitamins (B COMPLEX 1 PO) Take 1 tablet by mouth 1 day or 1 dose.   B-D ULTRAFINE III SHORT PEN 31G X 8 MM MISC 1 each by Other route daily. For lantus   Calcium Carb-Cholecalciferol (CALCIUM 600+D) 600-800 MG-UNIT TABS Take 2 tablets by mouth at bedtime.   carboxymethylcellulose (REFRESH PLUS) 0.5 % SOLN Place 1 drop into both eyes 3 (three) times daily as needed (dry/irritated eyes).   carvedilol (COREG) 3.125 MG tablet Take 3.125 mg by mouth 2 (two) times daily with a meal.   citalopram (CELEXA) 10 MG tablet Take 30 mg by mouth at bedtime.    clemastine (TAVIST) 2.68 MG TABS tablet Take 2.68 mg by mouth 2 (two) times daily as needed.   diclofenac Sodium (VOLTAREN) 1 % GEL Apply 2 g topically every 12 (twelve) hours as needed.   diphenhydrAMINE (BENADRYL) 25 mg capsule Take 1 capsule (25 mg total) by mouth every 6 (six) hours as needed for itching.   ELIQUIS 5 MG TABS tablet TAKE 1 TABLET BY MOUTH TWICE A DAY (Patient taking differently: Take 5 mg by mouth 2 (two) times daily.)   ferrous gluconate (FERGON) 324 MG tablet Take 324 mg by mouth 3 (three) times daily.   gabapentin (NEURONTIN) 300 MG capsule Take 2 capsules (600 mg total) by mouth 3 (three) times daily. (Patient taking differently: Take 600 mg by mouth 2 (two) times daily.)   guaifenesin (ROBITUSSIN) 100 MG/5ML syrup Take 200 mg by  mouth every 4 (four) hours as needed for cough.   HYDROcodone-acetaminophen (NORCO) 7.5-325 MG tablet Take 1 tablet by mouth every 6 (six) hours as needed for moderate pain.   Insulin Glargine (BASAGLAR KWIKPEN) 100 UNIT/ML SOPN Inject 27 Units into the skin at bedtime.   LACTOBACILLUS PO Take 1 capsule by mouth daily at 6 (six) AM.   Lidocaine HCl-Benzyl Alcohol (SALONPAS LIDOCAINE PLUS) 4-10 % CREA Apply topically. Pain relief Patch apply 9a apply patch to Lower back and shoulder ( apply in morning, remove in Evening)   lisinopril-hydrochlorothiazide (ZESTORETIC) 10-12.5 MG tablet Take 1 tablet by mouth daily.   loperamide (IMODIUM) 2 MG capsule Take 2 mg by mouth as needed for diarrhea or loose stools. (Patient not taking: Reported on 12/27/2020)   Nitrofurantoin Monohyd Macro (MACROBID PO) Take 50 mg by mouth at bedtime.   nitrofurantoin, macrocrystal-monohydrate, (MACROBID) 100 MG capsule Take 100 mg by mouth 2 (two) times daily.   NOVOLOG 100 UNIT/ML injection Inject 0-8 Units into the skin daily as needed for other. Check daily at 0730am 0-200 0 units 201-250 2 units 251-300 4 units 301-350 6 units 351-400 8 units > 400 call MD   nystatin (MYCOSTATIN/NYSTOP) powder Apply 1 application topically as needed. For skin folds   omeprazole (PRILOSEC) 20 MG capsule Take 20 mg by mouth at bedtime.   polyethylene glycol powder (GLYCOLAX/MIRALAX) 17 GM/SCOOP powder Take 17 g by mouth in the morning  and at bedtime. (Patient not taking: Reported on 12/27/2020)   promethazine (PHENERGAN) 25 MG tablet Take 25 mg by mouth every 6 (six) hours as needed for nausea or vomiting. (Patient not taking: Reported on 12/27/2020)   senna-docusate (SENOKOT-S) 8.6-50 MG tablet Take 1 tablet by mouth 2 (two) times daily as needed for moderate constipation. (Patient not taking: Reported on 12/27/2020)   sodium chloride (OCEAN) 0.65 % SOLN nasal spray Place 2 sprays into both nostrils daily.   No facility-administered encounter  medications on file as of 04/25/2021.    HOSPICE ELIGIBILITY/DIAGNOSIS: TBD  PAST MEDICAL HISTORY:  Past Medical History:  Diagnosis Date   Allergic rhinitis    Anxiety    Arthritis    Bell's palsy    Cataract    Cirrhosis ([place])    Coarse tremors    Colon cancer ([facility])    Diabetes mellitus without complication (HCC)    Diverticulosis    DVT (deep venous thrombosis) (HCC)    GERD (gastroesophageal reflux disease)    GI bleed    Hepatitis B    History of blood transfusion 05/2019   History of panic attacks    Hypertension    Insomnia    Iron deficiency anemia    MDD (major depressive disorder)    Morbid obesity (HCC)    Neuropathy    Portal vein thrombosis    Psoriasis    Renal insufficiency      SOCIAL HX: @SOCX  Patient lives in SNF for ongoing care  FAMILY HX:  Family History  Problem Relation Age of Onset   Kidney failure Mother    Diabetes Mother    Obesity Mother    Hypertension Mother    Cirrhosis Father    Hypertension Sister    Kidney failure Brother    Hypertension Brother    Hypertension Sister    Alcoholism Brother    Other Brother        MRSA   Cirrhosis Daughter    Drug abuse Daughter      ALLERGIES:  Allergies  Allergen Reactions   Xarelto [Rivaroxaban] Rash    Severe rash with itching   Erythromycin Other (See Comments)    Oral Thrush   Lipitor [Atorvastatin] Other (See Comments)    Leg cramps   Pravachol [Pravastatin] Other (See Comments)    Leg muscle cramps   Ivp Dye [Iodinated Contrast Media] Rash    I spent 50 minutes providing this consultation; time includes spent with patient/family, chart review and documentation. More than 50% of the time in this consultation was spent on care coordination Thank you for the opportunity to participate in the care of Fallyn Munnerlyn Please call our office at [phone] if we can be of additional assistance.  Note: Portions of this note were generated with [employer]. Dictation  errors may occur despite best attempts at proofreading.

## 2021-04-28 ENCOUNTER — Other Ambulatory Visit: Payer: Self-pay

## 2021-04-28 ENCOUNTER — Inpatient Hospital Stay: Payer: Medicare HMO | Attending: Oncology

## 2021-04-28 ENCOUNTER — Ambulatory Visit (HOSPITAL_BASED_OUTPATIENT_CLINIC_OR_DEPARTMENT_OTHER)
Admission: RE | Admit: 2021-04-28 | Discharge: 2021-04-28 | Disposition: A | Discharge status: Home / Self Care | Payer: Medicare HMO | Source: Ambulatory Visit | Attending: Nurse Practitioner | Admitting: Nurse Practitioner

## 2021-04-28 DIAGNOSIS — C187 Malignant neoplasm of sigmoid colon: Secondary | ICD-10-CM | POA: Insufficient documentation

## 2021-04-28 LAB — CBC WITH DIFFERENTIAL (CANCER CENTER ONLY)
Abs Immature Granulocytes: 0 10*3/uL (ref 0.00–0.07)
Basophils Absolute: 0 10*3/uL (ref 0.0–0.1)
Basophils Relative: 0 %
Eosinophils Absolute: 0.1 10*3/uL (ref 0.0–0.5)
Eosinophils Relative: 3 %
HCT: 32 % — ABNORMAL LOW (ref 36.0–46.0)
Hemoglobin: 9.7 g/dL — ABNORMAL LOW (ref 12.0–15.0)
Immature Granulocytes: 0 %
Lymphocytes Relative: 32 %
Lymphs Abs: 0.9 10*3/uL (ref 0.7–4.0)
MCH: 29.1 pg (ref 26.0–34.0)
MCHC: 30.3 g/dL (ref 30.0–36.0)
MCV: 96.1 fL (ref 80.0–100.0)
Monocytes Absolute: 0.3 10*3/uL (ref 0.1–1.0)
Monocytes Relative: 12 %
Neutro Abs: 1.4 10*3/uL — ABNORMAL LOW (ref 1.7–7.7)
Neutrophils Relative %: 53 %
Platelet Count: 124 10*3/uL — ABNORMAL LOW (ref 150–400)
RBC: 3.33 MIL/uL — ABNORMAL LOW (ref 3.87–5.11)
RDW: 16.4 % — ABNORMAL HIGH (ref 11.5–15.5)
WBC Count: 2.7 10*3/uL — ABNORMAL LOW (ref 4.0–10.5)
nRBC: 0 % (ref 0.0–0.2)

## 2021-04-28 LAB — BASIC METABOLIC PANEL - CANCER CENTER ONLY
Anion gap: 7 (ref 5–15)
BUN: 27 mg/dL — ABNORMAL HIGH (ref 8–23)
CO2: 27 mmol/L (ref 22–32)
Calcium: 10 mg/dL (ref 8.9–10.3)
Chloride: 106 mmol/L (ref 98–111)
Creatinine: 0.76 mg/dL (ref 0.44–1.00)
GFR, Estimated: >60 mL/min (ref >60)
Glucose, Bld: 118 mg/dL — ABNORMAL HIGH (ref 70–99)
Potassium: 3.8 mmol/L (ref 3.5–5.1)
Sodium: 140 mmol/L (ref 135–145)

## 2021-04-28 LAB — CEA (ACCESS): CEA (CHCC): 110.28 ng/mL — ABNORMAL HIGH (ref 0.00–5.00)

## 2021-04-28 IMAGING — CT CT CHEST W/O CM
2 of 4 series · 15 of 36 positions shown, 18 images · non-contrast
Comparison: [DATE]

CLINICAL DATA: Metastatic colon cancer restaging

EXAM:
CT CHEST WITHOUT CONTRAST
TECHNIQUE: Multidetector CT imaging of the chest was performed following the
standard protocol without IV contrast.

[Series 4: routine chest without · axial · non-contrast · 0.68mm/px · z∈[+1128,+1358]mm · 12 of 137 slices shown, 15 images]
[im 11/137  mediastinal]
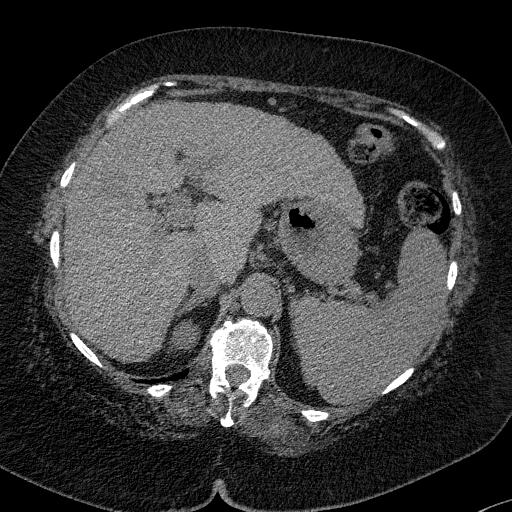
[im 11/137  lung]
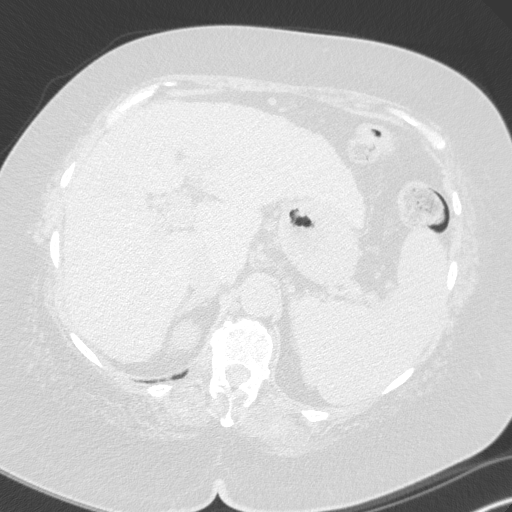
[im 21/137  lung]
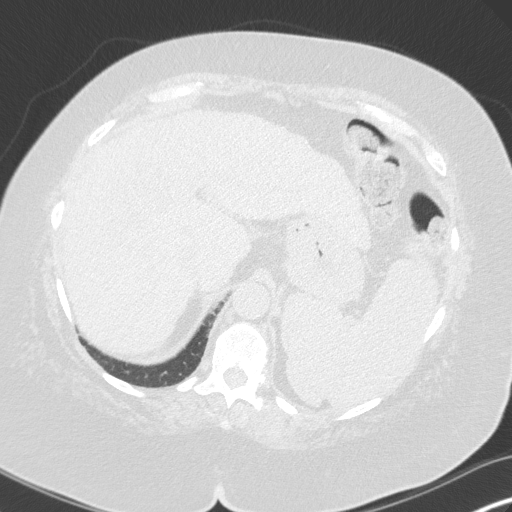
[im 32/137  lung]
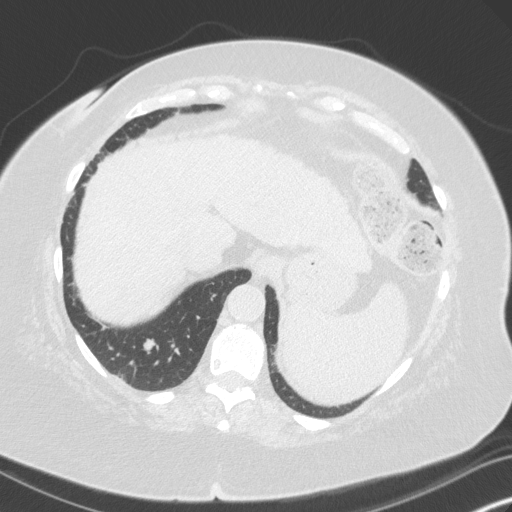
[im 42/137  lung]
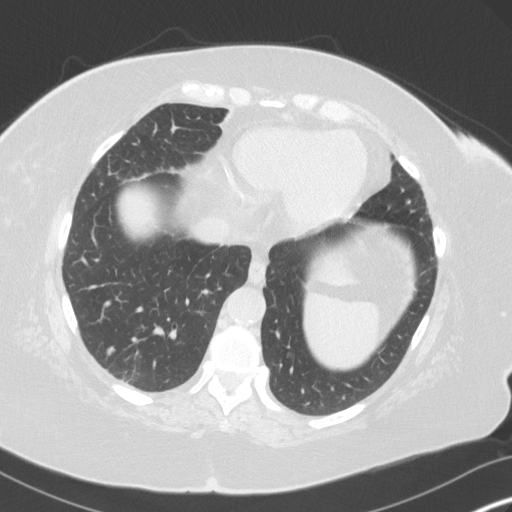
[im 53/137  mediastinal]
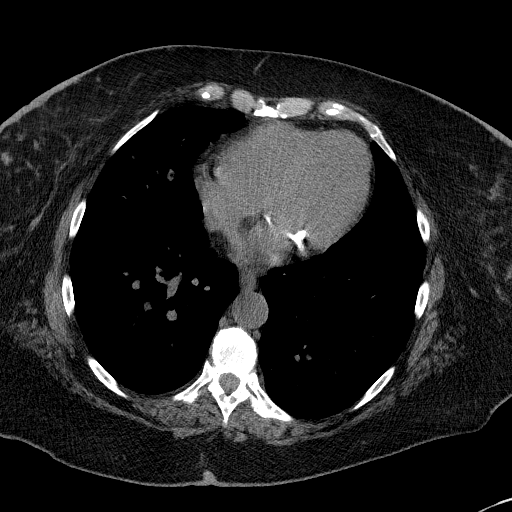
[im 53/137  lung]
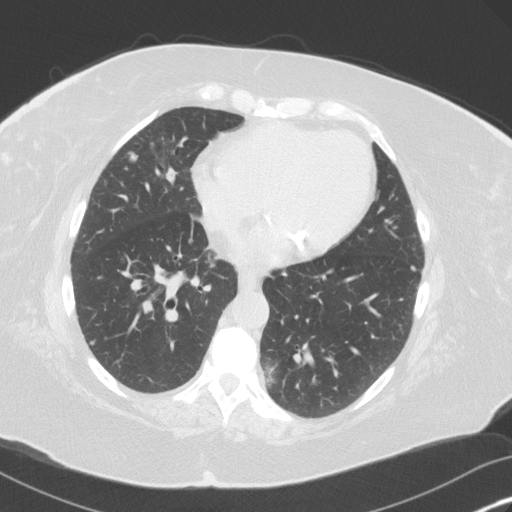
[im 63/137  lung]
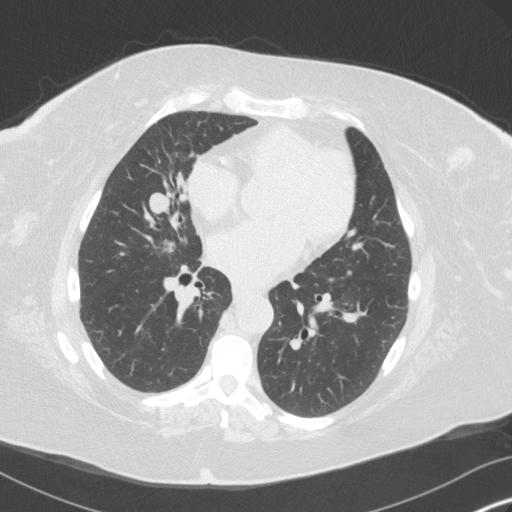
[im 74/137  lung]
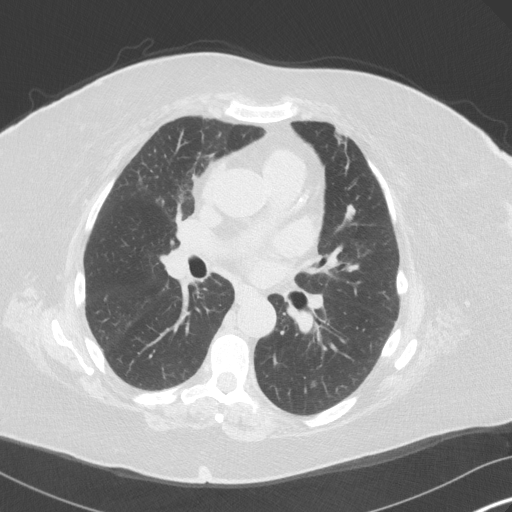
[im 84/137  lung]
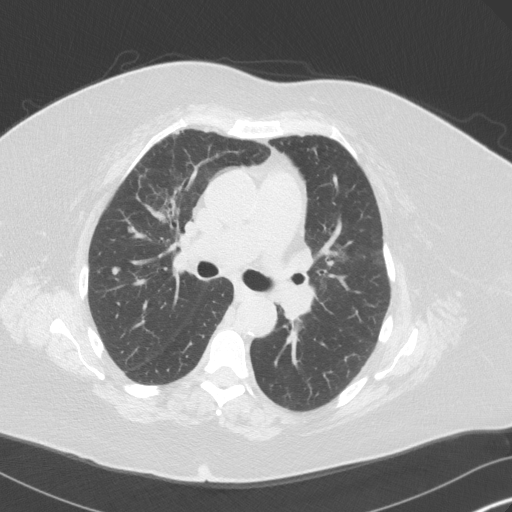
[im 95/137  mediastinal]
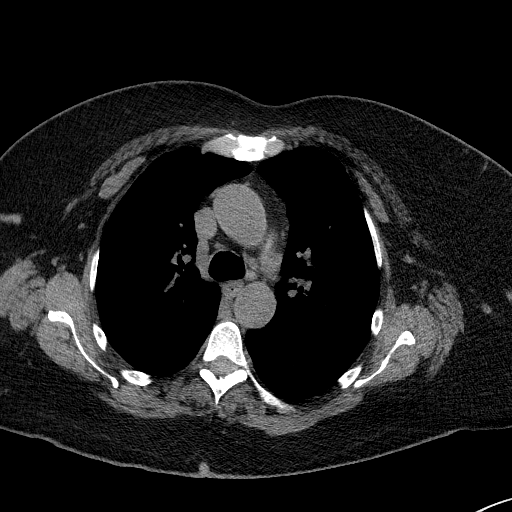
[im 95/137  lung]
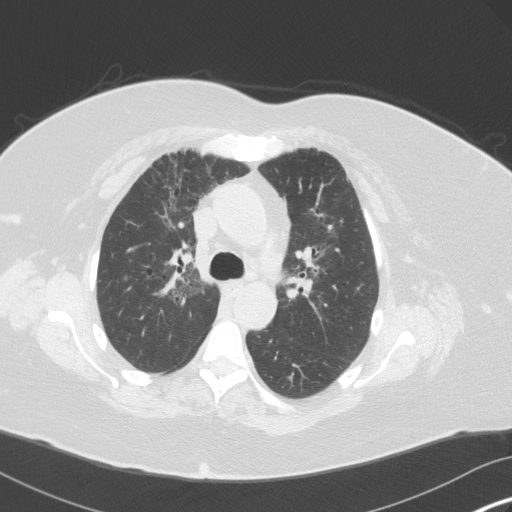
[im 105/137  lung]
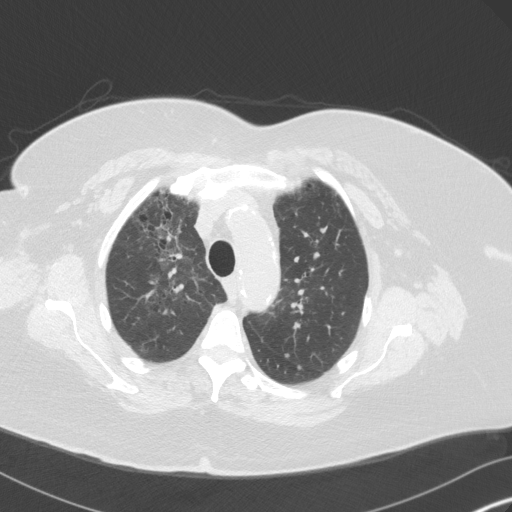
[im 116/137  lung]
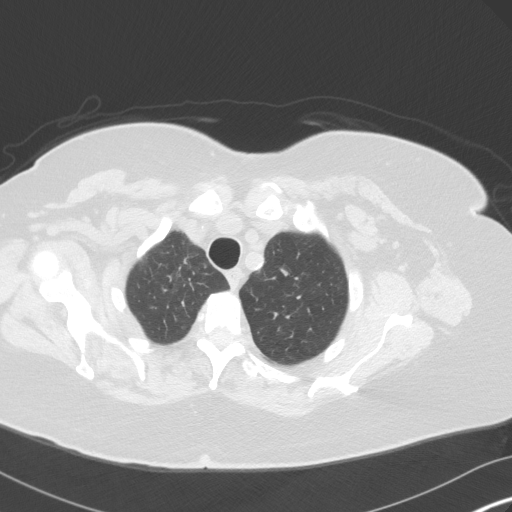
[im 126/137  lung]
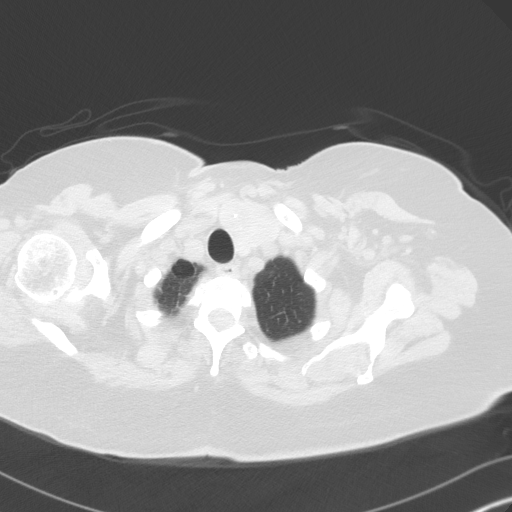

[Series 7: coronal · coronal · 0.62mm/px · 3 of 161 slices shown]
[im 33/161  lung]
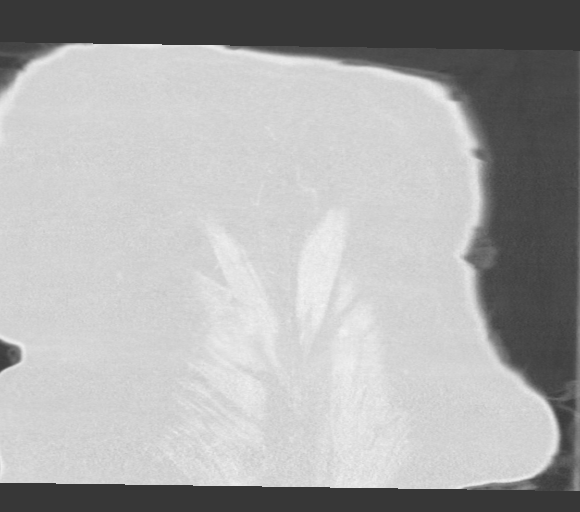
[im 65/161  lung]
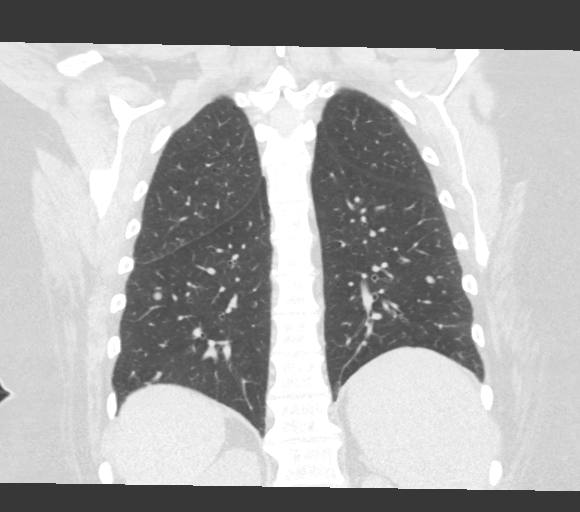
[im 97/161  lung]
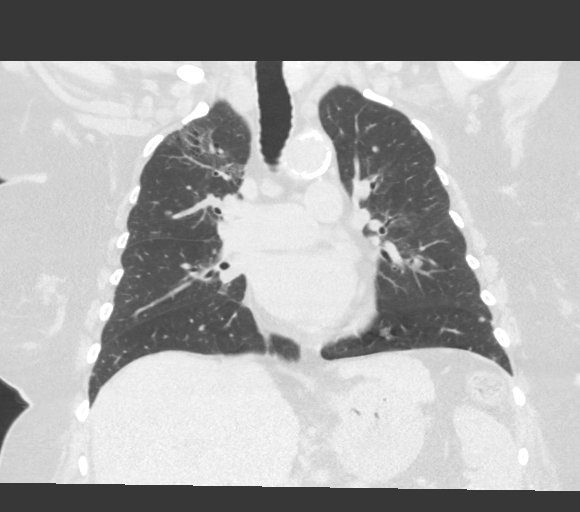

[15 of 36 positions shown; findings below may reference images not displayed]

FINDINGS: Cardiovascular: Aortic atherosclerosis. Normal heart size.
Three-vessel coronary artery calcifications. No pericardial
effusion.

Mediastinum/Nodes: No enlarged mediastinal, hilar, or axillary lymph
nodes. Enlarged, heterogeneous thyroid, as seen on prior
examination. Trachea, and esophagus demonstrate no significant
findings.

Lungs/Pleura: Numerous new and enlarged bilateral pulmonary nodules,
index nodule in the right pulmonary apex measuring 1.4 x 1.1 cm,
previously 0.8 x 0.8 cm (series 6, image 30), index nodule of the
lingula measuring 1.3 x 1.0 cm, previously 1.0 x 0.5 cm (series 6,
image 62), and of the right lower lobe measuring 1.0 x 0.9 cm,
previously

0.6 x 0.6 cm (series 6, image 69). Redemonstrated irregular scarring
and architectural distortion in the anterior right upper lobe
(series 6, image 45).

Upper Abdomen: No acute abnormality. Coarse, nodular, cirrhotic
morphology of the liver and partially imaged splenomegaly. Calcified
gallstones and sludge in the gallbladder, partially imaged.

Musculoskeletal: No chest wall mass or suspicious bone lesions
identified.
IMPRESSION: 1. Numerous new and enlarged bilateral pulmonary nodules, consistent
with worsened pulmonary metastatic disease.
2. Coronary artery disease.
3. Coarse, nodular, cirrhotic morphology of the liver and partially
imaged splenomegaly in the included upper abdomen.
4. Cholelithiasis.
5. Enlarged, heterogeneous thyroid. In the setting of significant
comorbidities or limited life expectancy, no follow-up recommended
(ref: [HOSPITAL]. [DATE]): 143-50).

Aortic Atherosclerosis ([9E]-[9E]).

## 2021-05-02 ENCOUNTER — Other Ambulatory Visit: Payer: Self-pay

## 2021-05-02 ENCOUNTER — Telehealth: Payer: Self-pay | Admitting: *Deleted

## 2021-05-02 ENCOUNTER — Inpatient Hospital Stay: Payer: Medicare HMO | Attending: Oncology | Admitting: Oncology

## 2021-05-02 ENCOUNTER — Other Ambulatory Visit: Payer: Self-pay | Admitting: Oncology

## 2021-05-02 ENCOUNTER — Telehealth: Payer: Self-pay

## 2021-05-02 VITALS — BP 112/55 | HR 65 | Temp 98.1°F | Resp 18 | Ht 64.0 in

## 2021-05-02 DIAGNOSIS — C187 Malignant neoplasm of sigmoid colon: Secondary | ICD-10-CM | POA: Insufficient documentation

## 2021-05-02 DIAGNOSIS — K7460 Unspecified cirrhosis of liver: Secondary | ICD-10-CM | POA: Diagnosis not present

## 2021-05-02 DIAGNOSIS — E785 Hyperlipidemia, unspecified: Secondary | ICD-10-CM | POA: Insufficient documentation

## 2021-05-02 DIAGNOSIS — G893 Neoplasm related pain (acute) (chronic): Secondary | ICD-10-CM | POA: Insufficient documentation

## 2021-05-02 DIAGNOSIS — C7951 Secondary malignant neoplasm of bone: Secondary | ICD-10-CM | POA: Diagnosis not present

## 2021-05-02 DIAGNOSIS — I129 Hypertensive chronic kidney disease with stage 1 through stage 4 chronic kidney disease, or unspecified chronic kidney disease: Secondary | ICD-10-CM | POA: Insufficient documentation

## 2021-05-02 DIAGNOSIS — N189 Chronic kidney disease, unspecified: Secondary | ICD-10-CM | POA: Insufficient documentation

## 2021-05-02 DIAGNOSIS — G250 Essential tremor: Secondary | ICD-10-CM | POA: Insufficient documentation

## 2021-05-02 DIAGNOSIS — R159 Full incontinence of feces: Secondary | ICD-10-CM | POA: Diagnosis not present

## 2021-05-02 DIAGNOSIS — F419 Anxiety disorder, unspecified: Secondary | ICD-10-CM | POA: Insufficient documentation

## 2021-05-02 DIAGNOSIS — Z86718 Personal history of other venous thrombosis and embolism: Secondary | ICD-10-CM | POA: Diagnosis not present

## 2021-05-02 DIAGNOSIS — E1122 Type 2 diabetes mellitus with diabetic chronic kidney disease: Secondary | ICD-10-CM | POA: Diagnosis not present

## 2021-05-02 DIAGNOSIS — Z79891 Long term (current) use of opiate analgesic: Secondary | ICD-10-CM | POA: Insufficient documentation

## 2021-05-02 MED ORDER — OXYCODONE HCL 5 MG PO TABS: 5.0000 mg | ORAL_TABLET | ORAL | 0 refills | Status: DC | PRN

## 2021-05-02 NOTE — Progress Notes (Signed)
[facility] OFFICE PROGRESS NOTE   Diagnosis: Colon cancer  INTERVAL HISTORY:   Tonya Dougherty returns as scheduled.  She reports increased pain in the left sacrum and left leg.  The pain is not relieved with hydrocodone.  She has chronic urine and fecal incontinence.  No dyspnea.  The pain is worse with weightbearing.  Objective:  Vital signs in last 24 hours:  Blood pressure (!) 112/55, pulse 65, temperature 98.1 F (36.7 C), temperature source Oral, resp. rate 18, height '5\' 4"'  (1.626 m), SpO2 100 %.    Resp: Decreased breath sounds with inspiratory rhonchi at the lower posterior chest bilaterally, no respiratory distress Cardio: Regular rate and rhythm GI: No hepatosplenomegaly Vascular: Chronic stasis change at the left lower leg Musculoskeletal: No tenderness at the sacrum or iliac. Neurologic: The motor exam appears intact in the legs and feet bilaterally, sensation is intact to light touch   Lab Results:  Lab Results  Component Value Date   WBC 2.7 (L) 04/28/2021   HGB 9.7 (L) 04/28/2021   HCT 32.0 (L) 04/28/2021   MCV 96.1 04/28/2021   PLT 124 (L) 04/28/2021   NEUTROABS 1.4 (L) 04/28/2021    CMP  Lab Results  Component Value Date   NA 140 04/28/2021   K 3.8 04/28/2021   CL 106 04/28/2021   CO2 27 04/28/2021   GLUCOSE 118 (H) 04/28/2021   BUN 27 (H) 04/28/2021   CREATININE 0.76 04/28/2021   CALCIUM 10.0 04/28/2021   PROT 7.1 08/26/2020   ALBUMIN 3.1 (L) 08/26/2020   AST 29 08/26/2020   ALT 15 08/26/2020   ALKPHOS 118 08/26/2020   BILITOT 1.0 08/26/2020   GFRNONAA >60 04/28/2021   GFRAA >60 08/16/2019    Lab Results  Component Value Date   CEA1 39.76 (H) 12/27/2020   CEA 110.28 (H) 04/28/2021   Medications: I have reviewed the patient's current medications.   Assessment/Plan: 1.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, status post sigmoidectomy 08/14/2019 - MSI Stable  Left sacral  mass consistent with colorectal primary -MRI of the thoracic and lumbar spine 03/24/2020 -abnormal T2/STIR hyperintense lesion involving the central and left aspect of the sacrum concerning for osseous metastatic disease, probable invasion of adjacent left S1-S2 neural foramina, extraosseous extension into the adjacent presacral space -CT chest/abdomen/pelvis 03/25/2020-destructive left sacral lesion, associated 2.8 x 3.5 cm soft tissue component anterior to the left sacrum worrisome for metastasis, 2.6 cm hypoenhancing mass in the right upper kidney-?  Metastasis versus renal cell carcinoma -CT-guided biopsy of left sacral mass 03/28/2020-metastatic adenocarcinoma consistent with GI primary, MSS, tumor mutation burden 4, IDH 1, K-ras wild-type -Radiation to left sacral mass 04/01/2020-04/14/2020 -CTs 08/05/2020-similar size and appearance of left presacral soft tissue metastasis and lytic lesion within the left sacrum.  No progressive metastatic disease identified.  Previously identified indeterminate lesion in the interpolar region of the right kidney not well seen but has not clearly resolved.  No bladder wall thickening.  Mildly increased size of small mediastinal lymph nodes.  Small retroperitoneal pelvic lymph nodes unchanged.  Stable additional incidental findings including morphologic changes of cirrhosis, splenomegaly, cholelithiasis and aortic atherosclerosis.  Heterogeneous enlargement of the thyroid gland with multiple calcifications unchanged from previous CT examinations. -CTs 12/27/2020-stable sacral metastasis with left presacral soft tissue mass.  Small pelvic nodes indeterminant.  Index lung nodules increased in size.  New smaller scattered pulmonary nodules. -CT chest 04/28/2021-enlarged and new bilateral pulmonary nodules, cirrhosis of the  liver, splenomegaly, enlarged heterogenous thyroid 2.  History of iron deficiency anemia 3.  Cirrhosis with splenomegaly 5.  Left lower extremity DVT  diagnosed 2019 6.  Hypertension 7.  Diabetes mellitus 8.  Benign essential tremor 9.  CKD 10.  Hyperlipidemia 11.  Morbid obesity 12.  Anxiety and depression 13.  Tobacco dependence 14.  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  Pain secondary to #1, improved following palliative radiation 16.  Right renal mass on CT 03/25/2020; previously identified indeterminate lesion in the interpolar region of the right kidney not well seen but not clearly resolved on CT 08/05/2020. 17.  Anemia-progressive 08/08/2020.  Ferritin added to labs.  Made request to facility to complete stool Hemoccult cards x3 and repeat CBC in 2 weeks.  Hemoglobin improved 09/16/2020. 18.  Mild leukopenia and thrombocytopenia-question related to cirrhosis     Disposition: Tonya Dougherty has metastatic colon cancer.  The CEA is higher and the restaging chest CT reveals evidence of disease progression.  I reviewed the CT findings and images with her.  We discussed treatment options.  We discussed capecitabine/bevacizumab and FOLFOX therapy.  She has increased pain at the left pelvis and leg, likely related to progression of the sacral metastasis.  We will prescribe oxycodone to use as needed for pain.  She will be referred for a restaging CT of the abdomen and pelvis. Tonya Dougherty will return for an office visit next week.  We will discuss specifics of a systemic therapy regimen when she is here next week.  She will call if the oxycodone does not relieve her pain. Betsy Coder, MD  05/02/2021  11:10 AM

## 2021-05-02 NOTE — Telephone Encounter (Signed)
Pain medication orders were changed for Pt Hydrocodone-acetaminophen(NORCO) will be discontinued once oxycodone IR is received for Pt. Spoke to Ms Montine Circle and Nurse Estill Bamberg )at [facility] hard copy faxed as well to [phone].

## 2021-05-02 NOTE — Telephone Encounter (Signed)
CT scan scheduled for 05/05/21 at 0745/0800 at [facility]. No contrast per MD. Notified her friend, Tonya Dougherty and she will alert [facility] as well as facility. Attempted to reach facility, but no one would answer phone.

## 2021-05-03 ENCOUNTER — Telehealth: Payer: Self-pay | Admitting: Oncology

## 2021-05-03 NOTE — Telephone Encounter (Signed)
Patient left voicemail in regards of her appts that are upcoming, attempted to reach back out but no answer. Left voicemail to patient about appts but advised to also call back if she has any other questions.

## 2021-05-05 ENCOUNTER — Ambulatory Visit (HOSPITAL_BASED_OUTPATIENT_CLINIC_OR_DEPARTMENT_OTHER): Admission: RE | Admit: 2021-05-05 | Payer: Medicare HMO | Source: Ambulatory Visit

## 2021-05-10 ENCOUNTER — Telehealth: Payer: Self-pay

## 2021-05-10 ENCOUNTER — Inpatient Hospital Stay: Payer: Medicare HMO | Admitting: Nurse Practitioner

## 2021-05-10 NOTE — Telephone Encounter (Signed)
Faxed progress note to medical record attend to [facility] faxed # [phone]

## 2021-05-10 NOTE — Telephone Encounter (Signed)
Called the patient to check on her because she miss her schedule appointment

## 2021-05-10 NOTE — Telephone Encounter (Signed)
Call and spoke with April (Doctor, hospital) at [facility]. April stated the patient miss her appointment due to severe pain. The patient have no appetite and alert X 1 and this started 4 days ago. Patient was reschedule for her CT scan on 1/18 and visit with provider on the 1/20 April confirmed the appointments and  no contrast per MD. I also mailed the avs to [facility]. April voiced understanding

## 2021-05-12 ENCOUNTER — Emergency Department (HOSPITAL_COMMUNITY): Payer: Medicare HMO

## 2021-05-12 ENCOUNTER — Inpatient Hospital Stay (HOSPITAL_COMMUNITY)
Admission: EM | Admit: 2021-05-12 | Discharge: 2021-05-18 | DRG: 682 | Disposition: A | Discharge status: Hospice | Payer: Medicare HMO | Source: Skilled Nursing Facility | Attending: Family Medicine | Admitting: Family Medicine

## 2021-05-12 ENCOUNTER — Other Ambulatory Visit: Payer: Self-pay

## 2021-05-12 ENCOUNTER — Encounter (HOSPITAL_COMMUNITY): Payer: Self-pay | Admitting: Oncology

## 2021-05-12 DIAGNOSIS — I959 Hypotension, unspecified: Secondary | ICD-10-CM | POA: Diagnosis present

## 2021-05-12 DIAGNOSIS — B964 Proteus (mirabilis) (morganii) as the cause of diseases classified elsewhere: Secondary | ICD-10-CM | POA: Diagnosis present

## 2021-05-12 DIAGNOSIS — L409 Psoriasis, unspecified: Secondary | ICD-10-CM | POA: Diagnosis present

## 2021-05-12 DIAGNOSIS — Z85038 Personal history of other malignant neoplasm of large intestine: Secondary | ICD-10-CM

## 2021-05-12 DIAGNOSIS — C7951 Secondary malignant neoplasm of bone: Secondary | ICD-10-CM | POA: Diagnosis present

## 2021-05-12 DIAGNOSIS — Z7901 Long term (current) use of anticoagulants: Secondary | ICD-10-CM

## 2021-05-12 DIAGNOSIS — K7581 Nonalcoholic steatohepatitis (NASH): Secondary | ICD-10-CM | POA: Diagnosis present

## 2021-05-12 DIAGNOSIS — Z881 Allergy status to other antibiotic agents status: Secondary | ICD-10-CM

## 2021-05-12 DIAGNOSIS — L89311 Pressure ulcer of right buttock, stage 1: Secondary | ICD-10-CM | POA: Diagnosis present

## 2021-05-12 DIAGNOSIS — J189 Pneumonia, unspecified organism: Secondary | ICD-10-CM | POA: Diagnosis present

## 2021-05-12 DIAGNOSIS — Z888 Allergy status to other drugs, medicaments and biological substances status: Secondary | ICD-10-CM

## 2021-05-12 DIAGNOSIS — Z79899 Other long term (current) drug therapy: Secondary | ICD-10-CM

## 2021-05-12 DIAGNOSIS — Z91041 Radiographic dye allergy status: Secondary | ICD-10-CM

## 2021-05-12 DIAGNOSIS — Z515 Encounter for palliative care: Secondary | ICD-10-CM

## 2021-05-12 DIAGNOSIS — Z6834 Body mass index (BMI) 34.0-34.9, adult: Secondary | ICD-10-CM

## 2021-05-12 DIAGNOSIS — E1165 Type 2 diabetes mellitus with hyperglycemia: Secondary | ICD-10-CM | POA: Diagnosis present

## 2021-05-12 DIAGNOSIS — C187 Malignant neoplasm of sigmoid colon: Secondary | ICD-10-CM | POA: Diagnosis present

## 2021-05-12 DIAGNOSIS — E669 Obesity, unspecified: Secondary | ICD-10-CM | POA: Diagnosis present

## 2021-05-12 DIAGNOSIS — Z8249 Family history of ischemic heart disease and other diseases of the circulatory system: Secondary | ICD-10-CM

## 2021-05-12 DIAGNOSIS — E1169 Type 2 diabetes mellitus with other specified complication: Secondary | ICD-10-CM | POA: Diagnosis present

## 2021-05-12 DIAGNOSIS — Z20822 Contact with and (suspected) exposure to covid-19: Secondary | ICD-10-CM | POA: Diagnosis present

## 2021-05-12 DIAGNOSIS — R627 Adult failure to thrive: Secondary | ICD-10-CM | POA: Diagnosis present

## 2021-05-12 DIAGNOSIS — F17210 Nicotine dependence, cigarettes, uncomplicated: Secondary | ICD-10-CM | POA: Diagnosis present

## 2021-05-12 DIAGNOSIS — F331 Major depressive disorder, recurrent, moderate: Secondary | ICD-10-CM | POA: Diagnosis present

## 2021-05-12 DIAGNOSIS — E782 Mixed hyperlipidemia: Secondary | ICD-10-CM | POA: Diagnosis present

## 2021-05-12 DIAGNOSIS — R5381 Other malaise: Secondary | ICD-10-CM | POA: Diagnosis present

## 2021-05-12 DIAGNOSIS — K7460 Unspecified cirrhosis of liver: Secondary | ICD-10-CM | POA: Diagnosis not present

## 2021-05-12 DIAGNOSIS — K219 Gastro-esophageal reflux disease without esophagitis: Secondary | ICD-10-CM | POA: Diagnosis present

## 2021-05-12 DIAGNOSIS — L8990 Pressure ulcer of unspecified site, unspecified stage: Secondary | ICD-10-CM | POA: Diagnosis present

## 2021-05-12 DIAGNOSIS — N179 Acute kidney failure, unspecified: Principal | ICD-10-CM

## 2021-05-12 DIAGNOSIS — G9340 Encephalopathy, unspecified: Secondary | ICD-10-CM | POA: Diagnosis not present

## 2021-05-12 DIAGNOSIS — I10 Essential (primary) hypertension: Secondary | ICD-10-CM | POA: Diagnosis present

## 2021-05-12 DIAGNOSIS — Z794 Long term (current) use of insulin: Secondary | ICD-10-CM

## 2021-05-12 DIAGNOSIS — L89321 Pressure ulcer of left buttock, stage 1: Secondary | ICD-10-CM | POA: Diagnosis present

## 2021-05-12 DIAGNOSIS — Z66 Do not resuscitate: Secondary | ICD-10-CM | POA: Diagnosis not present

## 2021-05-12 DIAGNOSIS — L299 Pruritus, unspecified: Secondary | ICD-10-CM | POA: Diagnosis not present

## 2021-05-12 DIAGNOSIS — Z841 Family history of disorders of kidney and ureter: Secondary | ICD-10-CM

## 2021-05-12 DIAGNOSIS — T3695XA Adverse effect of unspecified systemic antibiotic, initial encounter: Secondary | ICD-10-CM | POA: Diagnosis not present

## 2021-05-12 DIAGNOSIS — Z9049 Acquired absence of other specified parts of digestive tract: Secondary | ICD-10-CM

## 2021-05-12 DIAGNOSIS — M1990 Unspecified osteoarthritis, unspecified site: Secondary | ICD-10-CM | POA: Diagnosis present

## 2021-05-12 DIAGNOSIS — N390 Urinary tract infection, site not specified: Secondary | ICD-10-CM | POA: Diagnosis present

## 2021-05-12 DIAGNOSIS — Z792 Long term (current) use of antibiotics: Secondary | ICD-10-CM

## 2021-05-12 DIAGNOSIS — Z8619 Personal history of other infectious and parasitic diseases: Secondary | ICD-10-CM

## 2021-05-12 DIAGNOSIS — Z86718 Personal history of other venous thrombosis and embolism: Secondary | ICD-10-CM

## 2021-05-12 DIAGNOSIS — Z833 Family history of diabetes mellitus: Secondary | ICD-10-CM

## 2021-05-12 DIAGNOSIS — G893 Neoplasm related pain (acute) (chronic): Secondary | ICD-10-CM | POA: Diagnosis present

## 2021-05-12 DIAGNOSIS — Z79891 Long term (current) use of opiate analgesic: Secondary | ICD-10-CM

## 2021-05-12 DIAGNOSIS — D509 Iron deficiency anemia, unspecified: Secondary | ICD-10-CM | POA: Diagnosis present

## 2021-05-12 DIAGNOSIS — R262 Difficulty in walking, not elsewhere classified: Secondary | ICD-10-CM | POA: Diagnosis present

## 2021-05-12 DIAGNOSIS — G9341 Metabolic encephalopathy: Secondary | ICD-10-CM | POA: Diagnosis present

## 2021-05-12 LAB — CBC WITH DIFFERENTIAL/PLATELET
Abs Immature Granulocytes: 0.03 10*3/uL (ref 0.00–0.07)
Basophils Absolute: 0.1 10*3/uL (ref 0.0–0.1)
Basophils Relative: 1 %
Eosinophils Absolute: 0.1 10*3/uL (ref 0.0–0.5)
Eosinophils Relative: 1 %
HCT: 36.2 % (ref 36.0–46.0)
Hemoglobin: 11 g/dL — ABNORMAL LOW (ref 12.0–15.0)
Immature Granulocytes: 0 %
Lymphocytes Relative: 19 %
Lymphs Abs: 1.6 10*3/uL (ref 0.7–4.0)
MCH: 29.4 pg (ref 26.0–34.0)
MCHC: 30.4 g/dL (ref 30.0–36.0)
MCV: 96.8 fL (ref 80.0–100.0)
Monocytes Absolute: 1 10*3/uL (ref 0.1–1.0)
Monocytes Relative: 12 %
Neutro Abs: 5.5 10*3/uL (ref 1.7–7.7)
Neutrophils Relative %: 67 %
Platelets: 201 10*3/uL (ref 150–400)
RBC: 3.74 MIL/uL — ABNORMAL LOW (ref 3.87–5.11)
RDW: 16.7 % — ABNORMAL HIGH (ref 11.5–15.5)
WBC: 8.3 10*3/uL (ref 4.0–10.5)
nRBC: 0 % (ref 0.0–0.2)

## 2021-05-12 LAB — URINALYSIS, ROUTINE W REFLEX MICROSCOPIC
Bilirubin Urine: NEGATIVE
Glucose, UA: NEGATIVE mg/dL
Ketones, ur: 5 mg/dL — AB
Nitrite: NEGATIVE
Protein, ur: 30 mg/dL — AB
RBC / HPF: >50 RBC/hpf — ABNORMAL HIGH (ref 0–5)
Specific Gravity, Urine: 1.017 (ref 1.005–1.030)
WBC, UA: >50 WBC/hpf — ABNORMAL HIGH (ref 0–5)
pH: 5 (ref 5.0–8.0)

## 2021-05-12 LAB — COMPREHENSIVE METABOLIC PANEL
ALT: 27 U/L (ref 0–44)
AST: 47 U/L — ABNORMAL HIGH (ref 15–41)
Albumin: 3.1 g/dL — ABNORMAL LOW (ref 3.5–5.0)
Alkaline Phosphatase: 120 U/L (ref 38–126)
Anion gap: 12 (ref 5–15)
BUN: 83 mg/dL — ABNORMAL HIGH (ref 8–23)
CO2: 21 mmol/L — ABNORMAL LOW (ref 22–32)
Calcium: 8.9 mg/dL (ref 8.9–10.3)
Chloride: 104 mmol/L (ref 98–111)
Creatinine, Ser: 3.99 mg/dL — ABNORMAL HIGH (ref 0.44–1.00)
GFR, Estimated: 11 mL/min — ABNORMAL LOW (ref >60)
Glucose, Bld: 100 mg/dL — ABNORMAL HIGH (ref 70–99)
Potassium: 4.1 mmol/L (ref 3.5–5.1)
Sodium: 137 mmol/L (ref 135–145)
Total Bilirubin: 1.1 mg/dL (ref 0.3–1.2)
Total Protein: 6.8 g/dL (ref 6.5–8.1)

## 2021-05-12 LAB — RESP PANEL BY RT-PCR (FLU A&B, COVID) ARPGX2
Influenza A by PCR: NEGATIVE
Influenza B by PCR: NEGATIVE
SARS Coronavirus 2 by RT PCR: NEGATIVE

## 2021-05-12 LAB — LACTIC ACID, PLASMA: Lactic Acid, Venous: 1.5 mmol/L (ref 0.5–1.9)

## 2021-05-12 LAB — CBG MONITORING, ED: Glucose-Capillary: 111 mg/dL — ABNORMAL HIGH (ref 70–99)

## 2021-05-12 IMAGING — CT CT HEAD W/O CM
3 of 4 series · 14 of 47 positions shown, 16 images · non-contrast
Comparison: [DATE]

CLINICAL DATA: Altered mental status.



[Series 5: coronal soft tissue · coronal · 0.36mm/px · 3 of 71 slices shown]
[im 24/71  brain]
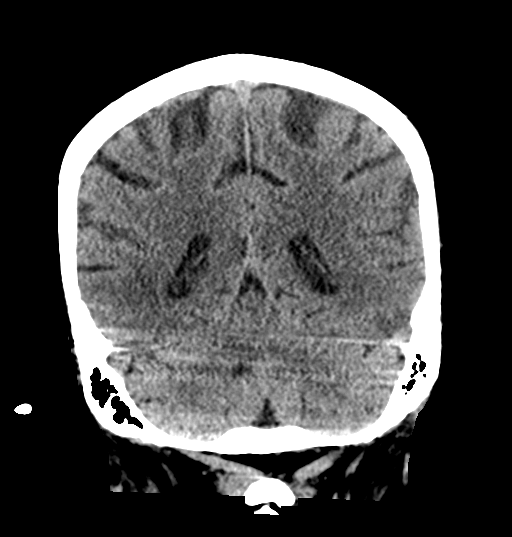
[im 32/71  brain]
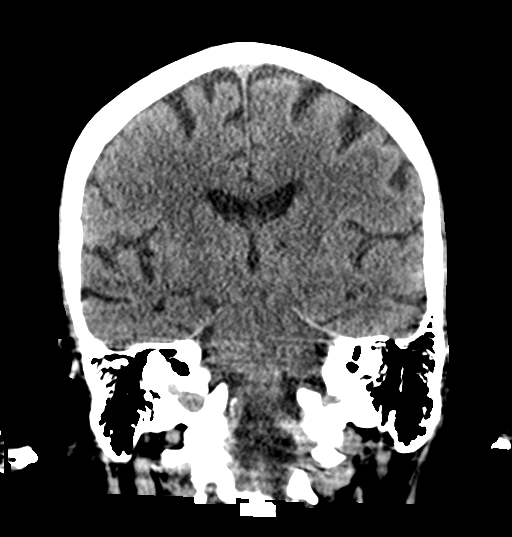
[im 39/71  brain]
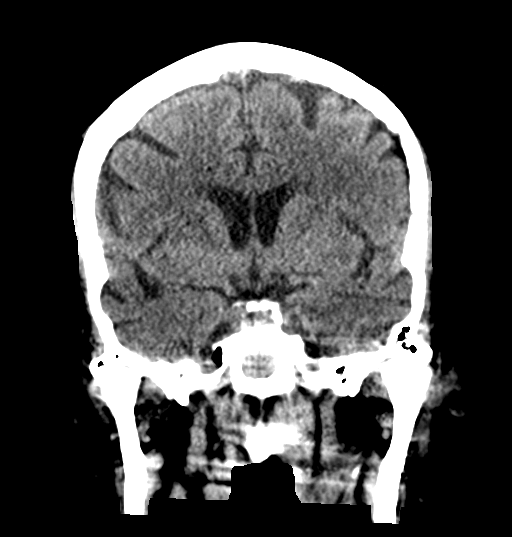

[Series 6: sagittal soft tissue · sagittal · 0.38mm/px · 3 of 58 slices shown]
[im 20/58  brain]
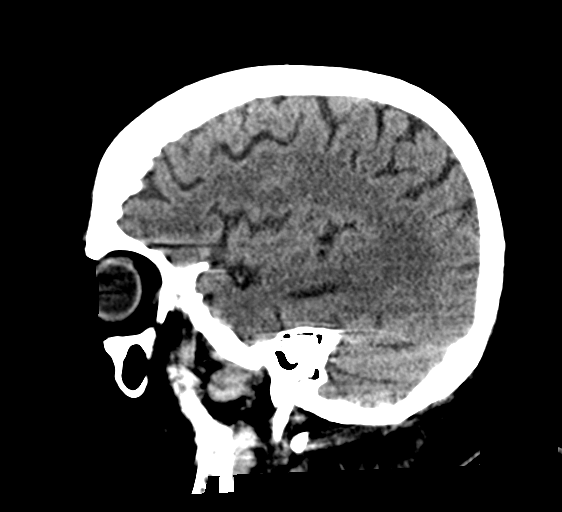
[im 29/58  brain]
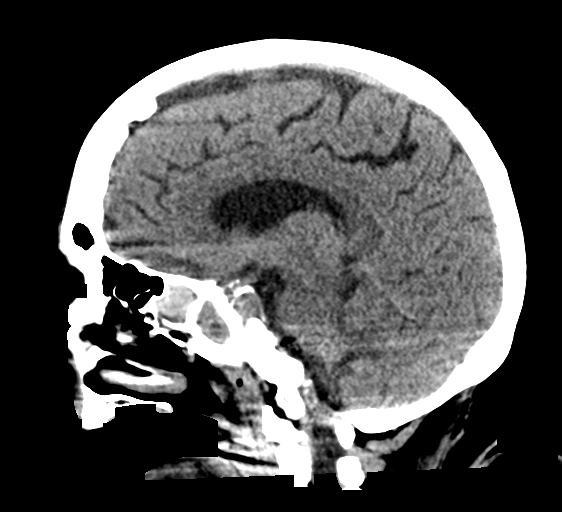
[im 39/58  brain]
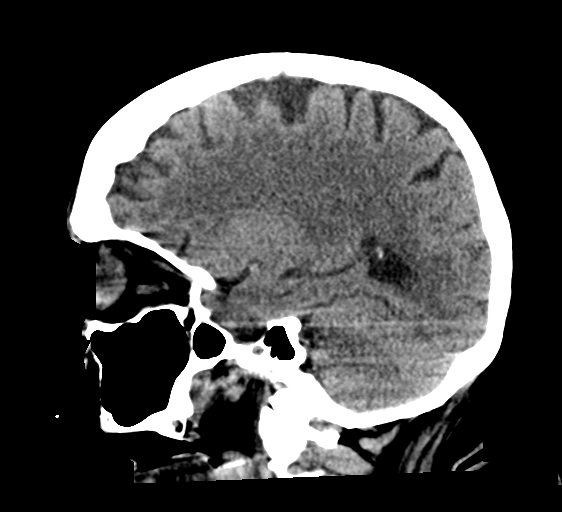

[Series 7: true axial · axial · 0.36mm/px · z∈[-113,+30]mm · 8 of 65 slices shown, 10 images]
[im 8/65  brain]
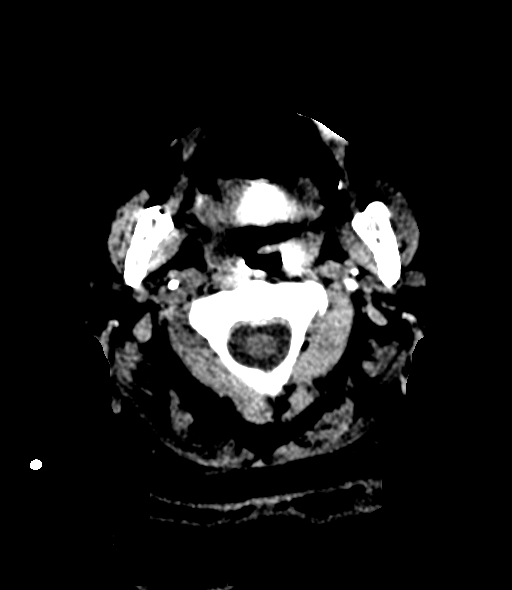
[im 8/65  bone]
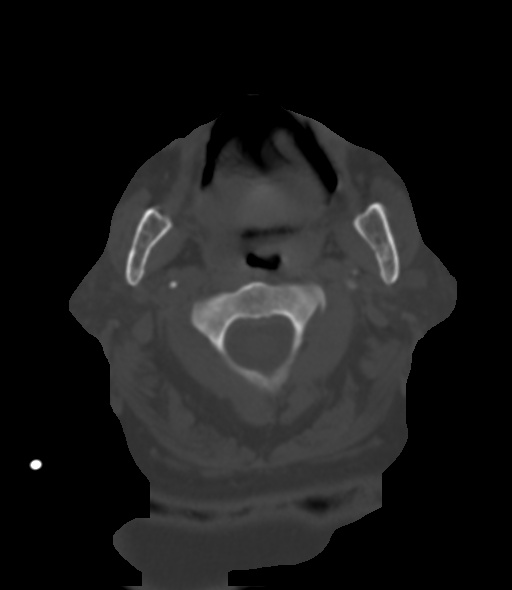
[im 15/65  brain]
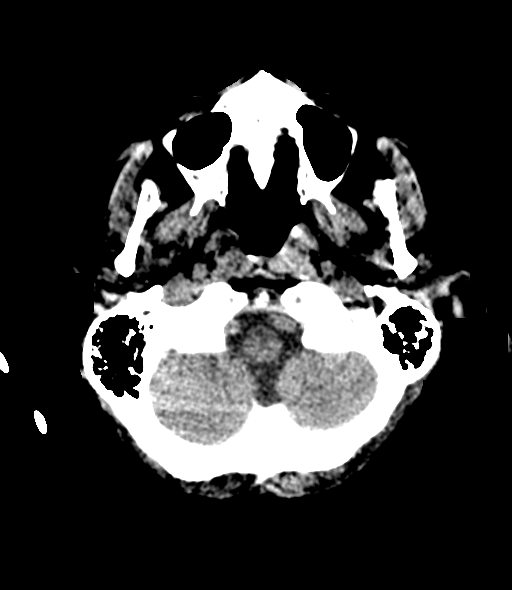
[im 22/65  brain]
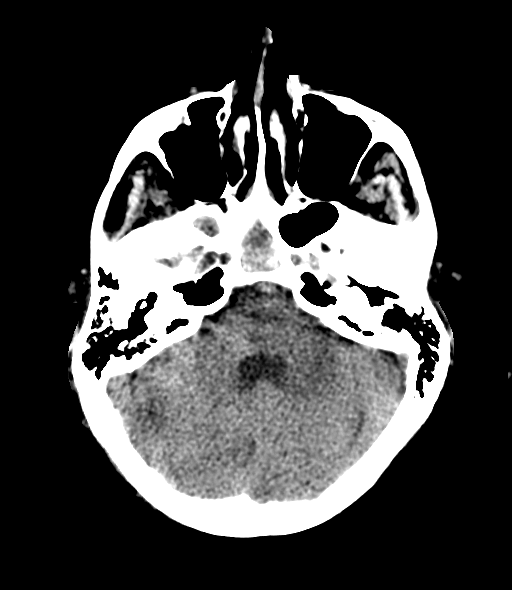
[im 29/65  brain]
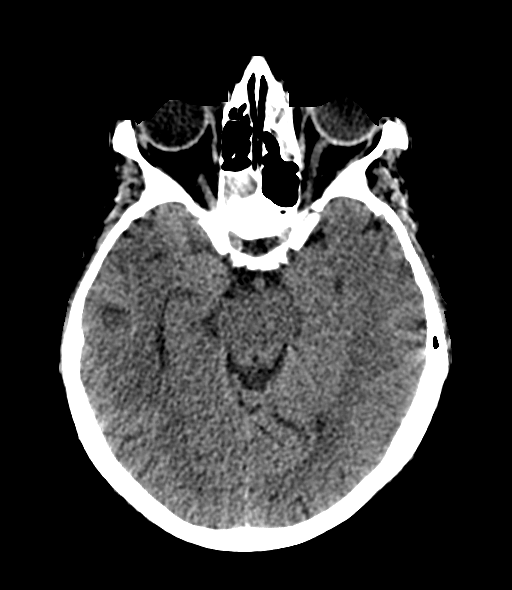
[im 36/65  brain]
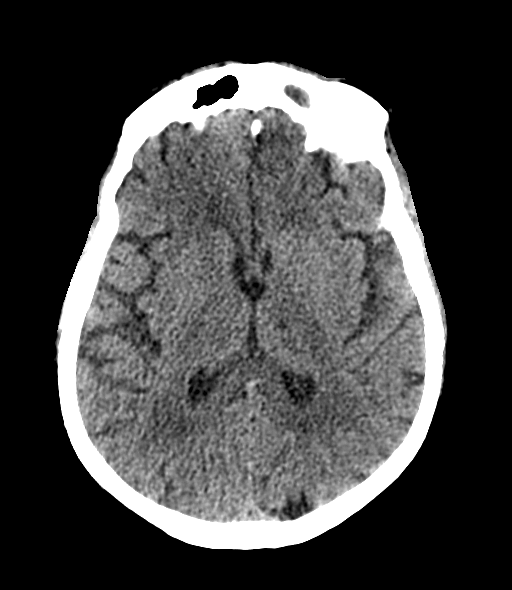
[im 36/65  bone]
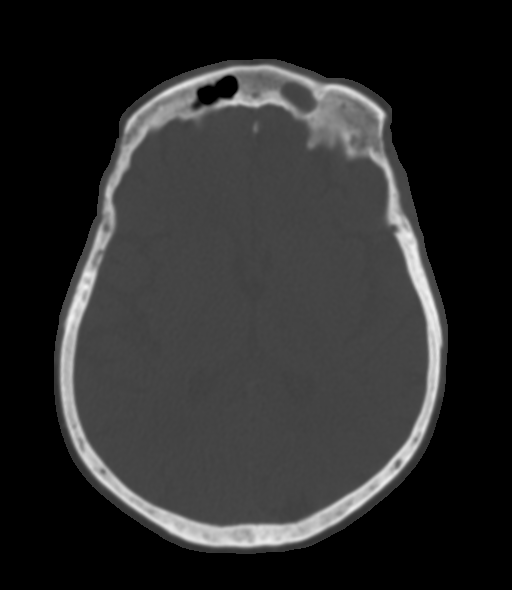
[im 43/65  brain]
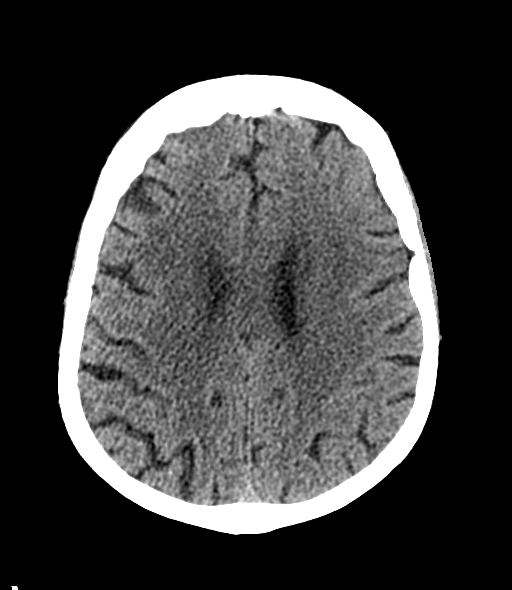
[im 50/65  brain]
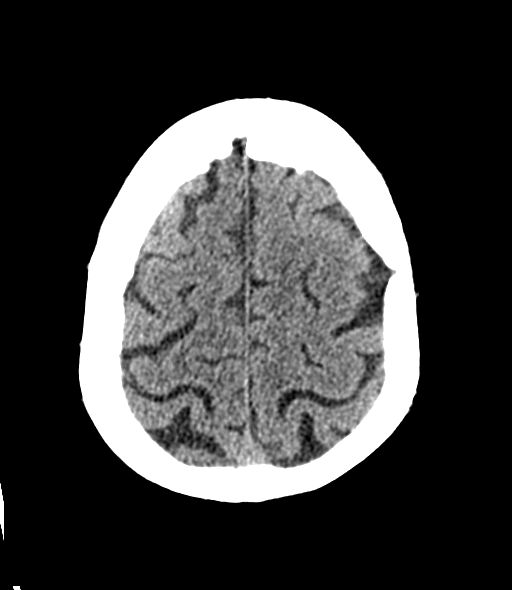
[im 57/65  brain]
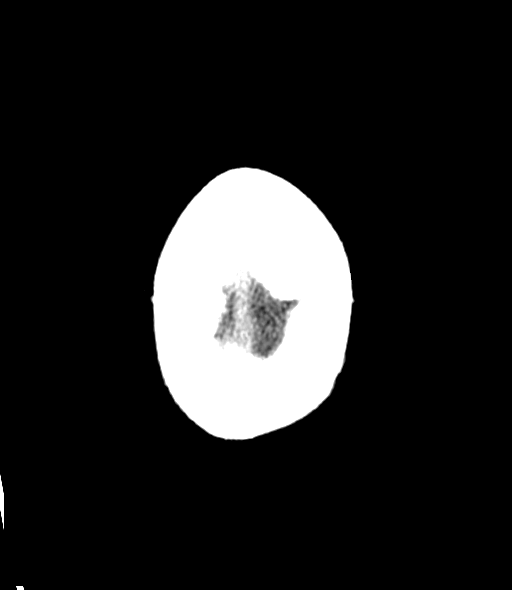

[14 of 47 positions shown; findings below may reference images not displayed]

FINDINGS: Brain: There is mild cerebral atrophy with widening of the
extra-axial spaces and ventricular dilatation.
There are areas of decreased attenuation within the white matter
tracts of the supratentorial brain, consistent with microvascular
disease changes.

Vascular: No hyperdense vessel or unexpected calcification.

Skull: Normal. Negative for fracture or focal lesion.

Sinuses/Orbits: There is marked severity bilateral ethmoid sinus,
sphenoid sinus and left-sided frontal sinus mucosal thickening.

Other: None.
IMPRESSION: 1. No acute intracranial abnormality.
2. Generalized cerebral atrophy with microvascular disease changes
within the white matter tracts of the supratentorial brain.
3. Marked severity bilateral ethmoid sinus, sphenoid sinus and
left-sided frontal sinus disease.

## 2021-05-12 IMAGING — DX DG CHEST 1V PORT
1 series · 1 of 1 positions shown · non-contrast
Comparison: [DATE].

CLINICAL DATA: Weakness.

EXAM:
PORTABLE CHEST 1 VIEW

[chest ap]
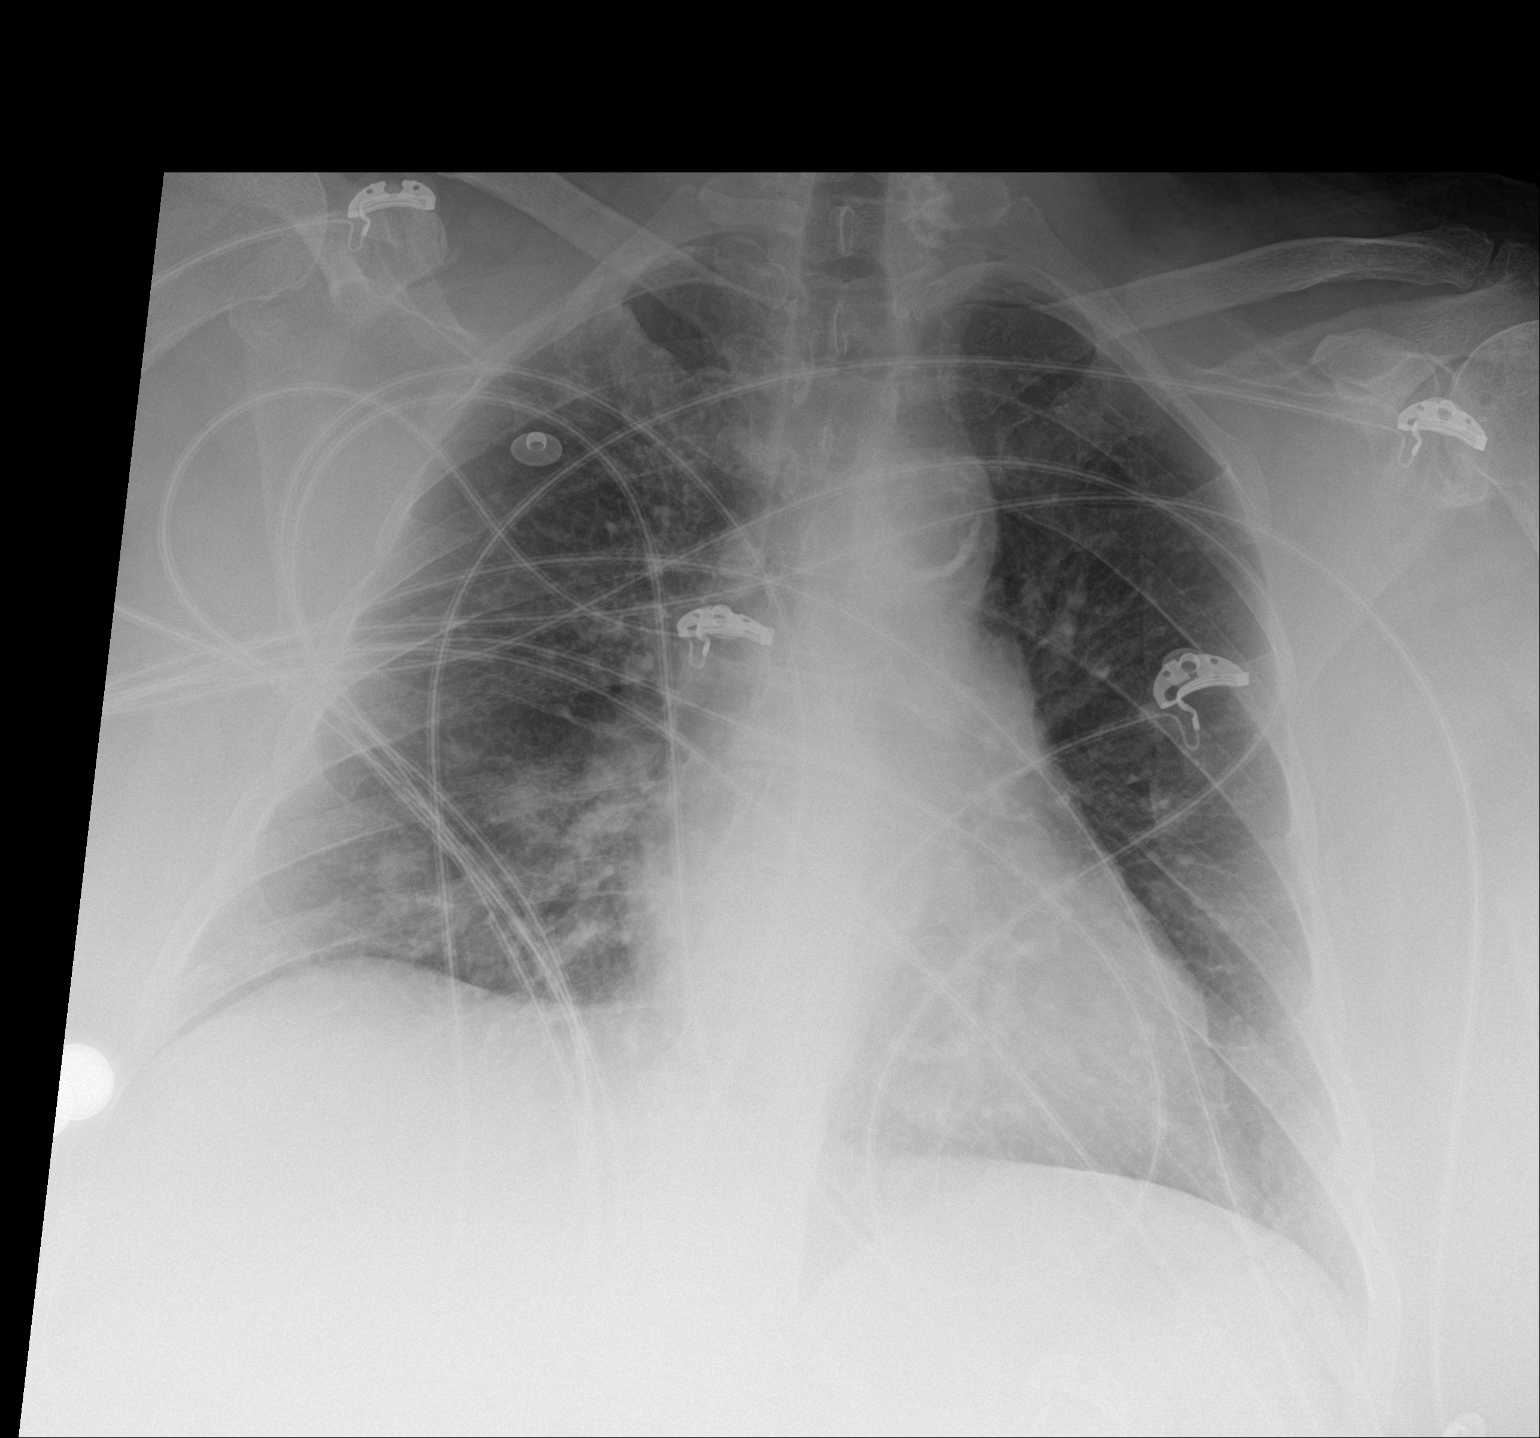

[1 of 1 positions shown; findings below may reference images not displayed]

FINDINGS: Stable cardiomediastinal silhouette. Left lung is clear. Mild right
basilar subsegmental atelectasis or possibly infiltrate is noted.
The visualized skeletal structures are unremarkable.
IMPRESSION: Mild right basilar subsegmental atelectasis or possibly infiltrate.

Aortic Atherosclerosis ([XD]-[XD]).

## 2021-05-12 MED ORDER — DEXTROSE 5 % IV SOLN
250.0000 mg | INTRAVENOUS | Status: DC
  Filled 2021-05-12: qty 2.5

## 2021-05-12 MED ORDER — SODIUM CHLORIDE 0.9% FLUSH
3.0000 mL | Freq: Two times a day (BID) | INTRAVENOUS | Status: DC
  Administered 2021-05-12 – 2021-05-17 (×8): 3 mL via INTRAVENOUS

## 2021-05-12 MED ORDER — PANTOPRAZOLE SODIUM 40 MG PO TBEC
40.0000 mg | DELAYED_RELEASE_TABLET | Freq: Every day | ORAL | Status: DC
  Administered 2021-05-13 – 2021-05-16 (×4): 40 mg via ORAL
  Filled 2021-05-12 (×4): qty 1

## 2021-05-12 MED ORDER — APIXABAN 5 MG PO TABS
5.0000 mg | ORAL_TABLET | Freq: Two times a day (BID) | ORAL | Status: DC
  Administered 2021-05-12 – 2021-05-16 (×8): 5 mg via ORAL
  Filled 2021-05-12 (×8): qty 1

## 2021-05-12 MED ORDER — SODIUM CHLORIDE 0.9 % IV SOLN: INTRAVENOUS | Status: DC

## 2021-05-12 MED ORDER — POLYVINYL ALCOHOL 1.4 % OP SOLN: 1.0000 [drp] | Freq: Three times a day (TID) | OPHTHALMIC | Status: DC | PRN

## 2021-05-12 MED ORDER — OXYCODONE HCL 5 MG PO TABS
5.0000 mg | ORAL_TABLET | ORAL | Status: DC | PRN
  Administered 2021-05-13: 10 mg via ORAL
  Administered 2021-05-13: 5 mg via ORAL
  Administered 2021-05-14 – 2021-05-15 (×6): 10 mg via ORAL
  Administered 2021-05-15: 5 mg via ORAL
  Administered 2021-05-15 – 2021-05-16 (×5): 10 mg via ORAL
  Filled 2021-05-12 (×7): qty 2
  Filled 2021-05-12: qty 1
  Filled 2021-05-12 (×7): qty 2

## 2021-05-12 MED ORDER — DICLOFENAC SODIUM 1 % EX GEL: 2.0000 g | Freq: Two times a day (BID) | CUTANEOUS | Status: DC | PRN

## 2021-05-12 MED ORDER — CARVEDILOL 3.125 MG PO TABS
3.1250 mg | ORAL_TABLET | Freq: Two times a day (BID) | ORAL | Status: DC
  Administered 2021-05-13 – 2021-05-16 (×6): 3.125 mg via ORAL
  Filled 2021-05-12 (×7): qty 1

## 2021-05-12 MED ORDER — SODIUM CHLORIDE 0.9 % IV BOLUS
2000.0000 mL | Freq: Once | INTRAVENOUS | Status: AC
  Administered 2021-05-12: 2000 mL via INTRAVENOUS

## 2021-05-12 MED ORDER — APIXABAN 5 MG PO TABS
5.0000 mg | ORAL_TABLET | Freq: Two times a day (BID) | ORAL | Status: DC
Start: 2021-05-13 — End: 2021-05-12

## 2021-05-12 MED ORDER — ACETAMINOPHEN 325 MG PO TABS
650.0000 mg | ORAL_TABLET | Freq: Four times a day (QID) | ORAL | Status: DC | PRN
  Administered 2021-05-15 – 2021-05-16 (×2): 650 mg via ORAL
  Filled 2021-05-12 (×2): qty 2

## 2021-05-12 MED ORDER — MUSCLE RUB 10-15 % EX CREA
1.0000 "application " | TOPICAL_CREAM | Freq: Every day | CUTANEOUS | Status: DC | PRN
  Administered 2021-05-18: 1 via TOPICAL
  Filled 2021-05-12: qty 85

## 2021-05-12 MED ORDER — INSULIN ASPART 100 UNIT/ML IJ SOLN
0.0000 [IU] | Freq: Three times a day (TID) | INTRAMUSCULAR | Status: DC
  Administered 2021-05-14 – 2021-05-16 (×4): 1 [IU] via SUBCUTANEOUS
  Filled 2021-05-12: qty 0.09

## 2021-05-12 MED ORDER — ACETAMINOPHEN 650 MG RE SUPP: 650.0000 mg | Freq: Four times a day (QID) | RECTAL | Status: DC | PRN

## 2021-05-12 MED ORDER — ALBUTEROL SULFATE (2.5 MG/3ML) 0.083% IN NEBU: 3.0000 mL | INHALATION_SOLUTION | Freq: Every day | RESPIRATORY_TRACT | Status: DC | PRN

## 2021-05-12 MED ORDER — LACTULOSE 10 GM/15ML PO SOLN
10.0000 g | Freq: Every day | ORAL | Status: DC
Start: 2021-05-12 — End: 2021-05-12

## 2021-05-12 MED ORDER — LOPERAMIDE HCL 2 MG PO CAPS: 2.0000 mg | ORAL_CAPSULE | ORAL | Status: DC | PRN

## 2021-05-12 MED ORDER — SODIUM CHLORIDE 0.9 % IV SOLN
2.0000 g | INTRAVENOUS | Status: DC
  Filled 2021-05-12: qty 20

## 2021-05-12 MED ORDER — POLYETHYLENE GLYCOL 3350 17 G PO PACK: 17.0000 g | PACK | Freq: Every day | ORAL | Status: DC | PRN

## 2021-05-12 MED ORDER — NYSTATIN 100000 UNIT/GM EX POWD
1.0000 "application " | Freq: Three times a day (TID) | CUTANEOUS | Status: DC
  Administered 2021-05-12 – 2021-05-18 (×15): 1 via TOPICAL
  Filled 2021-05-12 (×2): qty 15

## 2021-05-12 MED ORDER — DIPHENHYDRAMINE HCL 25 MG PO CAPS
25.0000 mg | ORAL_CAPSULE | Freq: Four times a day (QID) | ORAL | Status: DC | PRN
  Administered 2021-05-12 – 2021-05-16 (×6): 25 mg via ORAL
  Filled 2021-05-12 (×6): qty 1

## 2021-05-12 MED ORDER — SODIUM CHLORIDE 0.9 % IV SOLN
1.0000 g | Freq: Once | INTRAVENOUS | Status: DC
  Filled 2021-05-12: qty 10

## 2021-05-12 MED ORDER — SODIUM CHLORIDE 0.9 % IV SOLN
500.0000 mg | Freq: Once | INTRAVENOUS | Status: DC
  Filled 2021-05-12: qty 5

## 2021-05-12 MED ORDER — LACTULOSE 10 GM/15ML PO SOLN
10.0000 g | Freq: Every day | ORAL | Status: DC
  Administered 2021-05-13 – 2021-05-16 (×4): 10 g via ORAL
  Filled 2021-05-12: qty 15
  Filled 2021-05-12: qty 30
  Filled 2021-05-12 (×2): qty 15

## 2021-05-12 NOTE — ED Notes (Signed)
Pt remains confused w/ involuntary movement of upper extremities.

## 2021-05-12 NOTE — ED Provider Notes (Signed)
[facility] DEPT Provider Note   CSN: [id number] Arrival date & time: 05/12/21  1426     History  Chief Complaint  Patient presents with   Altered Mental Status    Tonya Dougherty is a 71 y.o. female.  HPI She presents for evaluation of confusion and decreased oral intake by EMS.  They noted that she was hypotensive and treated her with IV saline during transport.  She is unable to give any history.  She is currently residing in an assisted living facility.    Home Medications Prior to Admission medications   Medication Sig Start Date End Date Taking? Authorizing Provider  acetaminophen (TYLENOL) 325 MG tablet Take 650 mg by mouth See admin instructions. 650mg  twice daily And 325mg  three times daily as needed for pain    [provider]  albuterol (VENTOLIN HFA) 108 (90 Base) MCG/ACT inhaler Inhale 2 puffs into the lungs daily as needed for wheezing or shortness of breath.    [provider]  aluminum-magnesium hydroxide 200-200 MG/5ML suspension Take 30 mLs by mouth every 4 (four) hours as needed for indigestion.    [provider]  amantadine (SYMMETREL) 100 MG capsule Take 100 mg by mouth 2 (two) times daily.    [provider]  Azelastine HCl 137 MCG/SPRAY SOLN Place 1 spray into both nostrils daily. 08/24/20   [provider]  B Complex Vitamins (B COMPLEX 1 PO) Take 1 tablet by mouth 1 day or 1 dose.    [provider]  B-D ULTRAFINE III SHORT PEN 31G X 8 MM MISC 1 each by Other route daily. For lantus 10/24/17   [provider]  Calcium Carb-Cholecalciferol (CALCIUM 600+D) 600-800 MG-UNIT TABS Take 2 tablets by mouth at bedtime.    [provider]  carboxymethylcellulose (REFRESH PLUS) 0.5 % SOLN Place 1 drop into both eyes 3 (three) times daily as needed (dry/irritated eyes).    [provider]  carvedilol (COREG) 3.125 MG tablet Take 3.125 mg by mouth 2 (two) times  daily with a meal.    [provider]  citalopram (CELEXA) 10 MG tablet Take 30 mg by mouth at bedtime.     [provider]  clemastine (TAVIST) 2.68 MG TABS tablet Take 2.68 mg by mouth 2 (two) times daily as needed.    [provider]  diclofenac Sodium (VOLTAREN) 1 % GEL Apply 2 g topically every 12 (twelve) hours as needed.    [provider]  diphenhydrAMINE (BENADRYL) 25 mg capsule Take 1 capsule (25 mg total) by mouth every 6 (six) hours as needed for itching. 04/14/20   Aline August, MD  ELIQUIS 5 MG TABS tablet TAKE 1 TABLET BY MOUTH TWICE A DAY Patient taking differently: Take 5 mg by mouth 2 (two) times daily. 03/10/20   Waynetta Sandy, MD  ferrous gluconate (FERGON) 324 MG tablet Take 324 mg by mouth 3 (three) times daily.    [provider]  gabapentin (NEURONTIN) 300 MG capsule Take 2 capsules (600 mg total) by mouth 3 (three) times daily. Patient taking differently: Take 600 mg by mouth 2 (two) times daily. 04/14/20   Aline August, MD  guaifenesin (ROBITUSSIN) 100 MG/5ML syrup Take 200 mg by mouth every 4 (four) hours as needed for cough.    [provider]  HYDROcodone-acetaminophen (NORCO) 7.5-325 MG tablet Take 1 tablet by mouth every 6 (six) hours as needed for moderate pain.    [provider]  Insulin  Glargine (BASAGLAR KWIKPEN) 100 UNIT/ML SOPN Inject 27 Units into the skin at bedtime. 10/11/17   [provider]  LACTOBACILLUS PO Take 1 capsule by mouth daily at 6 (six) AM.    [provider]  Lidocaine HCl-Benzyl Alcohol (SALONPAS LIDOCAINE PLUS) 4-10 % CREA Apply topically. Pain relief Patch apply 9a apply patch to Lower back and shoulder ( apply in morning, remove in Evening)    [provider]  lisinopril-hydrochlorothiazide (ZESTORETIC) 10-12.5 MG tablet Take 1 tablet by mouth daily. 11/20/20   [provider]  loperamide (IMODIUM) 2 MG capsule Take 2 mg by mouth  as needed for diarrhea or loose stools. Patient not taking: Reported on 12/27/2020    [provider]  Nitrofurantoin Monohyd Macro (MACROBID PO) Take 50 mg by mouth at bedtime.    [provider]  nitrofurantoin, macrocrystal-monohydrate, (MACROBID) 100 MG capsule Take 100 mg by mouth 2 (two) times daily. 12/20/20   [provider]  NOVOLOG 100 UNIT/ML injection Inject 0-8 Units into the skin daily as needed for other. Check daily at 0730am 0-200 0 units 201-250 2 units 251-300 4 units 301-350 6 units 351-400 8 units > 400 call MD 07/25/20   [provider]  nystatin (MYCOSTATIN/NYSTOP) powder Apply 1 application topically as needed. For skin folds    [provider]  omeprazole (PRILOSEC) 20 MG capsule Take 20 mg by mouth at bedtime.    [provider]  oxyCODONE (OXY IR/ROXICODONE) 5 MG immediate release tablet Take 1-2 tablets (5-10 mg total) by mouth every 4 (four) hours as needed for severe pain. 05/02/21   Ladell Pier, MD  polyethylene glycol powder (GLYCOLAX/MIRALAX) 17 GM/SCOOP powder Take 17 g by mouth in the morning and at bedtime. Patient not taking: Reported on 12/27/2020 04/14/20   Aline August, MD  promethazine (PHENERGAN) 25 MG tablet Take 25 mg by mouth every 6 (six) hours as needed for nausea or vomiting. Patient not taking: Reported on 12/27/2020    [provider]  senna-docusate (SENOKOT-S) 8.6-50 MG tablet Take 1 tablet by mouth 2 (two) times daily as needed for moderate constipation. 04/14/20   Aline August, MD  sodium chloride (OCEAN) 0.65 % SOLN nasal spray Place 2 sprays into both nostrils daily.    [provider]      Allergies    Xarelto [rivaroxaban], Erythromycin, Lipitor [atorvastatin], Pravachol [pravastatin], and Ivp dye [iodinated contrast media]    Review of Systems   Review of Systems  Unable to perform ROS: Mental status change   Physical Exam Updated Vital Signs BP (!) 111/92     Pulse 86    Temp 98.5 F (36.9 C) (Rectal)    Resp (!) 22    SpO2 100%  Physical Exam Vitals and nursing note reviewed.  Constitutional:      General: She is in acute distress.     Appearance: She is well-developed. She is obese. She is ill-appearing. She is not toxic-appearing or diaphoretic.  HENT:     Head: Normocephalic and atraumatic.     Right Ear: External ear normal.     Left Ear: External ear normal.     Mouth/Throat:     Mouth: Mucous membranes are dry.  Eyes:     Conjunctiva/sclera: Conjunctivae normal.     Pupils: Pupils are equal, round, and reactive to light.  Neck:     Trachea: Phonation normal.  Cardiovascular:     Rate and Rhythm: Regular rhythm. Tachycardia present.  Heart sounds: Normal heart sounds.  Pulmonary:     Effort: Pulmonary effort is normal. No respiratory distress.     Breath sounds: Normal breath sounds. No stridor. No wheezing or rhonchi.  Abdominal:     Palpations: Abdomen is soft.     Tenderness: There is no abdominal tenderness.  Musculoskeletal:        General: Normal range of motion.     Cervical back: Normal range of motion and neck supple.  Skin:    General: Skin is warm and dry.     Coloration: Skin is not jaundiced or pale.  Neurological:     Mental Status: She is alert.     Cranial Nerves: No cranial nerve deficit.     Sensory: No sensory deficit.     Motor: No abnormal muscle tone.     Coordination: Coordination normal.     Comments: Responsive and follows commands.  Initially shaking, arms and legs bilaterally, this calmed when I talked to her and examined her.  It did not eliminate completely.  It does not appear to be a seizure.  She remained communicative during the shaking episode.  Psychiatric:     Comments: No apparent internal responsiveness.    ED Results / Procedures / Treatments   Labs (all labs ordered are listed, but only abnormal results are displayed) Labs Reviewed  COMPREHENSIVE METABOLIC PANEL - Abnormal;  Notable for the following components:      Result Value   CO2 21 (*)    Glucose, Bld 100 (*)    BUN 83 (*)    Creatinine, Ser 3.99 (*)    Albumin 3.1 (*)    AST 47 (*)    GFR, Estimated 11 (*)    All other components within normal limits  CBC WITH DIFFERENTIAL/PLATELET - Abnormal; Notable for the following components:   RBC 3.74 (*)    Hemoglobin 11.0 (*)    RDW 16.7 (*)    All other components within normal limits  URINALYSIS, ROUTINE W REFLEX MICROSCOPIC - Abnormal; Notable for the following components:   Color, Urine AMBER (*)    APPearance CLOUDY (*)    Hgb urine dipstick LARGE (*)    Ketones, ur 5 (*)    Protein, ur 30 (*)    Leukocytes,Ua LARGE (*)    RBC / HPF >50 (*)    WBC, UA >50 (*)    Bacteria, UA FEW (*)    All other components within normal limits  RESP PANEL BY RT-PCR (FLU A&B, COVID) ARPGX2  CULTURE, BLOOD (ROUTINE X 2)  CULTURE, BLOOD (ROUTINE X 2)  URINE CULTURE  LACTIC ACID, PLASMA  LACTIC ACID, PLASMA  AMMONIA    EKG None  Radiology CT Head Wo Contrast  Result Date: 05/12/2021 CLINICAL DATA:  Altered mental status. EXAM: CT HEAD WITHOUT CONTRAST TECHNIQUE: Contiguous axial images were obtained from the base of the skull through the vertex without intravenous contrast. RADIATION DOSE REDUCTION: This exam was performed according to the departmental dose-optimization program which includes automated exposure control, adjustment of the mA and/or kV according to patient size and/or use of iterative reconstruction technique. COMPARISON:  April 09, 2007 FINDINGS: Brain: There is mild cerebral atrophy with widening of the extra-axial spaces and ventricular dilatation. There are areas of decreased attenuation within the white matter tracts of the supratentorial brain, consistent with microvascular disease changes. Vascular: No hyperdense vessel or unexpected calcification. Skull: Normal. Negative for fracture or focal lesion. Sinuses/Orbits: There is marked  severity  bilateral ethmoid sinus, sphenoid sinus and left-sided frontal sinus mucosal thickening. Other: None. IMPRESSION: 1. No acute intracranial abnormality. 2. Generalized cerebral atrophy with microvascular disease changes within the white matter tracts of the supratentorial brain. 3. Marked severity bilateral ethmoid sinus, sphenoid sinus and left-sided frontal sinus disease. Electronically Signed   By: Virgina Norfolk M.D.   On: 05/12/2021 20:05   DG Chest Port 1 View  Result Date: 05/12/2021 CLINICAL DATA:  Weakness. EXAM: PORTABLE CHEST 1 VIEW COMPARISON:  April 30, 2019. FINDINGS: Stable cardiomediastinal silhouette. Left lung is clear. Mild right basilar subsegmental atelectasis or possibly infiltrate is noted. The visualized skeletal structures are unremarkable. IMPRESSION: Mild right basilar subsegmental atelectasis or possibly infiltrate. Aortic Atherosclerosis (ICD10-I70.0). Electronically Signed   By: Marijo Conception M.D.   On: 05/12/2021 15:43    Procedures .Critical Care Performed by: Daleen Bo, MD Authorized by: Daleen Bo, MD   Critical care provider statement:    Critical care time (minutes):  50   Critical care start time:  05/12/2021 3:10 PM   Critical care end time:  05/12/2021 8:59 PM   Critical care time was exclusive of:  Separately billable procedures and treating other patients   Critical care was necessary to treat or prevent imminent or life-threatening deterioration of the following conditions:  Metabolic crisis and sepsis   Critical care was time spent personally by me on the following activities:  Blood draw for specimens, development of treatment plan with patient or surrogate, discussions with consultants, evaluation of patient's response to treatment, examination of patient, ordering and performing treatments and interventions, ordering and review of laboratory studies, ordering and review of radiographic studies, pulse oximetry, re-evaluation of  patient's condition and review of old charts    Medications Ordered in ED Medications  cefTRIAXone (ROCEPHIN) 1 g in sodium chloride 0.9 % 100 mL IVPB (has no administration in time range)  azithromycin (ZITHROMAX) 500 mg in sodium chloride 0.9 % 250 mL IVPB (has no administration in time range)  sodium chloride 0.9 % bolus 2,000 mL (0 mLs Intravenous Stopped 05/12/21 1653)    ED Course/ Medical Decision Making/ A&P                           Medical Decision Making   Patient Vitals for the past 24 hrs:  BP Temp Temp src Pulse Resp SpO2  05/12/21 1900 (!) 111/92 -- -- 86 (!) 22 100 %  05/12/21 1835 107/80 -- -- 85 16 97 %  05/12/21 1755 (!) 122/94 -- -- 82 18 99 %  05/12/21 1726 -- -- -- 82 18 100 %  05/12/21 1700 105/84 -- -- (!) 49 20 (!) 87 %  05/12/21 1530 (!) 67/46 -- -- (!) 108 (!) 22 92 %  05/12/21 1437 (!) 80/40 98.5 F (36.9 C) Rectal (!) 138 17 92 %  05/12/21 1434 -- -- -- -- -- 95 %    7:31 PM Reevaluation with update and discussion with patient's daughter by telephone. After initial assessment and treatment, an updated evaluation reveals patient clinical status is unchanged, she continues to be confused and unable to give history.  Currently, oxygen saturation of 95% on room air.  No respiratory distress at this time.  Blood pressure and heart rate have now normalized.  Illness risk, progressive illness, and requirement for hospitalization, discussed. Daleen Bo    Medical Decision Making: Summary of Illness/Injury: She presents for evaluation by EMS.  She was  hypotensive in the field so EMS treated her with IV saline.  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  She lives at a nursing care facility.  Critical Interventions-clinical evaluation, laboratory testing, IV fluids, observation and reassessment; to evaluate  Chief Complaint  Patient presents with   Altered Mental Status    and assess for illness characterized as Acute, Previously Undiagnosed, Uncertain Prognosis,  Complicated, Systemic Symptoms, and Threat to Life/Bodily Function   The Differential Diagnoses include sepsis, intravascular volume disorder, metabolic instability, acute cardiac disorder, acute infectious process, chronic disability.  I obtained  Additional Historical Information from [facility] sister, [name] , as the patient is confused.  Patient "took a turn," on 05/05/2021, becoming more confused, weaker and now unable to walk.  [name] saw her at the facility today, prior to transport and at that time the patient could communicate with her.  Patient was recently started on oxycodone for bone pain from metastatic cancer.  Prior to that she had been on hydrocodone but it was not helping.  She was due to follow-up with the oncologist today about that, and consider systemic therapy.  I decided to review pertinent External Data, and in summary elderly patient, with history of diabetes, depression, colon cancer, cirrhosis, who is presenting for altered mental status.  She was evaluated at her facility, a skilled nursing home,, on 05/10/2021; at that time serum creatinine was 1.49, BUN 44, ammonia level 57..   Clinical Laboratory Tests Ordered, included CBC, Metabolic panel, Urinalysis, and viral panel, lactate, blood cultures . Review indicates normal except urinalysis abnormal consistent with infection, CO2 slightly low, glucose high, BUN high, creatinine high, albumin low, AST high, hemoglobin low. Emergent testing abnormality management required for stabilization-elevated creatinine/acute kidney injury, and abnormal urinalysis required treatment  Radiologic Tests Ordered, included chest x-ray.  I independently Visualized: Radiograph images, which show nonspecific abnormality right lower lobe, possibly consistent with pneumonia   This patient is Presenting for Evaluation of confusion, which does require a range of treatment options, and is a complaint that involves a high risk of morbidity and  mortality.  Pharmaceutical Risk Management antibiotics for possible infection, high-volume saline bolus primarily Parenteral Treatment   Treatment Complication Risk Evaluation indicates appropriate disposition isHospitalization for stabilization, treatment of AKI and ongoing monitoring for confusion  After These Interventions, the Patient was reevaluated and was found with significant AKI, likely due to poor oral intake, contributing to encephalopathy.  Possible infection sources include UTI and pneumonia.  Recent ammonia level elevation, up to 57 may be contributing to encephalopathy as well.  No respiratory distress in the ED.  Doubt impending vascular collapse and doubt sepsis.    8:59 PM-Consult complete with hospitalist. Patient case explained and discussed. Requirement for hospitalization to treat acute illness agreed on. Possible Risk of decompensation.  He agrees to admit patient for further evaluation and treatment. Call ended at 8:59 PM  CRITICAL CARE-yes Performed by: Daleen Bo              Final Clinical Impression(s) / ED Diagnoses Final diagnoses:  Encephalopathy  AKI (acute kidney injury) ([place])  Urinary tract infection without hematuria, site unspecified  Community acquired pneumonia of right lower lobe of lung    Rx / DC Orders ED Discharge Orders     None         Daleen Bo, MD 05/12/21 2100

## 2021-05-12 NOTE — ED Notes (Signed)
Pulse ox changed to pt's ear as she has involuntary movements of arms.  O2 reading 95% on RA w/ good pleth in new placement.

## 2021-05-12 NOTE — ED Triage Notes (Signed)
Pt bib [facility] from [facility] d/t AMS.  Per staff and pt's sister pt has had an increase in confusion/hallucinations over the past few days as well as poor PO intake.  Pt initially hypotensive in the field.  500 ml's NS given en route.  Pt has tremors at baseline.  Family is concerned about metastatic disease progression.

## 2021-05-12 NOTE — H&P (Signed)
History and Physical   Tonya Dougherty PHX:[id number] DOB: July 19, 1950 DOA: 05/12/2021  PCP: Gay Filler, MD   [facility]  Chief Complaint: Altered mental status  HPI: Tonya Dougherty is a 71 y.o. female with medical history significant of diabetes, obesity, hyperlipidemia, depression, anemia, GERD, history of DVT, hypertension, cirrhosis, colon cancer with bony mets, diverticulosis who presents with worsening mental status.  History obtained with assistance of family chart review due to patient's encephalopathy.  Patient has reportedly had some worsening confusion for the past few days at her facility.  Also with decreased p.o. intake.  Sister confirmed that patient has been confused and continue to be confused today but was able to communicate.  Family has some concerns about patient's metastatic colon cancer possibly having a role.  Patient has had decreased p.o. intake recently.  Was noted to have low blood pressure EMS received a 500 cc bolus. Patient also reportedly recently started on oxycodone for cancer pain.  Patient unable to provide full review of systems given altered mentation.  She does states that she feels "okay "  ED Course: Vital signs in the ED significant for blood pressure initially in the 01K to 55V systolic now improved to the 748-270 78M systolic.  Heart rate in the 80s to 110s, respiratory rate in the teens to 20s.  Lab work-up showed CMP with BUN 83 and creatinine elevated to 3.99 from baseline of 0.8.  Albumin 3.1, AST 47.  CBC with hemoglobin stable 11.  Lactic acid normal on first check with recheck pending.  Respiratory panel for flu and COVID-negative.  Urinalysis with hemoglobin, ketones, protein, leukocytes, few bacteria.  Blood cultures and urine cultures pending.  Chest x-ray showed mild right subsegmental atelectasis likely infiltrate.  CT head showed no acute abnormality but did show significant sinus disease.  Ammonia level  pending.  Patient received ceftriaxone azithromycin in the ED as well as 2 L of IV fluids.  Review of Systems: Patient unable to provide full review of systems given altered mentation.   Past Medical History:  Diagnosis Date   Allergic rhinitis    Anxiety    Arthritis    Bell's palsy    Cataract    Cirrhosis (HCC)    Coarse tremors    Colon cancer ([place])    Diabetes mellitus without complication (HCC)    Diverticulosis    DVT (deep venous thrombosis) (HCC)    GERD (gastroesophageal reflux disease)    GI bleed    Hepatitis B    History of blood transfusion 05/2019   History of panic attacks    Hypertension    Insomnia    Iron deficiency anemia    MDD (major depressive disorder)    Morbid obesity (HCC)    Neuropathy    Portal vein thrombosis    Psoriasis    Renal insufficiency     Past Surgical History:  Procedure Laterality Date   ANKLE SURGERY Right    CATARACT EXTRACTION, BILATERAL     COLONOSCOPY WITH ESOPHAGOGASTRODUODENOSCOPY (EGD)  05/2019   FLEXIBLE SIGMOIDOSCOPY N/A 08/14/2019   Procedure: FLEXIBLE SIGMOIDOSCOPY;  Surgeon: Ileana Roup, MD;  Location: [facility];  Service: General;  Laterality: N/A;    Social History  reports that she has been smoking cigarettes. She has been smoking an average of .5 packs per day. She has never used smokeless tobacco. She reports that she does not currently use drugs. She reports that she does not drink alcohol.  Allergies  Allergen Reactions   Xarelto [Rivaroxaban] Rash    Severe rash with itching   Erythromycin Other (See Comments)    Oral Thrush   Lipitor [Atorvastatin] Other (See Comments)    Leg cramps   Pravachol [Pravastatin] Other (See Comments)    Leg muscle cramps   Ivp Dye [Iodinated Contrast Media] Rash    Family History  Problem Relation Age of Onset   Kidney failure Mother    Diabetes Mother    Obesity Mother    Hypertension Mother    Cirrhosis Father    Hypertension Sister    Kidney failure  Brother    Hypertension Brother    Hypertension Sister    Alcoholism Brother    Other Brother        MRSA   Cirrhosis Daughter    Drug abuse Daughter   Reviewed on admission  Prior to Admission medications   Medication Sig Start Date End Date Taking? Authorizing Provider  acetaminophen (TYLENOL) 325 MG tablet Take 650 mg by mouth See admin instructions. 679m twice daily And 3268mthree times daily as needed for pain    [provider]  albuterol (VENTOLIN HFA) 108 (90 Base) MCG/ACT inhaler Inhale 2 puffs into the lungs daily as needed for wheezing or shortness of breath.    [provider]  aluminum-magnesium hydroxide 200-200 MG/5ML suspension Take 30 mLs by mouth every 4 (four) hours as needed for indigestion.    [provider]  amantadine (SYMMETREL) 100 MG capsule Take 100 mg by mouth 2 (two) times daily.    [provider]  Azelastine HCl 137 MCG/SPRAY SOLN Place 1 spray into both nostrils daily. 08/24/20   [provider]  B Complex Vitamins (B COMPLEX 1 PO) Take 1 tablet by mouth 1 day or 1 dose.    [provider]  B-D ULTRAFINE III SHORT PEN 31G X 8 MM MISC 1 each by Other route daily. For lantus 10/24/17   [provider]  Calcium Carb-Cholecalciferol (CALCIUM 600+D) 600-800 MG-UNIT TABS Take 2 tablets by mouth at bedtime.    [provider]  carboxymethylcellulose (REFRESH PLUS) 0.5 % SOLN Place 1 drop into both eyes 3 (three) times daily as needed (dry/irritated eyes).    [provider]  carvedilol (COREG) 3.125 MG tablet Take 3.125 mg by mouth 2 (two) times daily with a meal.    [provider]  citalopram (CELEXA) 10 MG tablet Take 30 mg by mouth at bedtime.     [provider]  clemastine (TAVIST) 2.68 MG TABS tablet Take 2.68 mg by mouth 2 (two) times daily as needed.    [provider]  diclofenac Sodium (VOLTAREN) 1 % GEL Apply 2 g topically every 12 (twelve) hours  as needed.    [provider]  diphenhydrAMINE (BENADRYL) 25 mg capsule Take 1 capsule (25 mg total) by mouth every 6 (six) hours as needed for itching. 04/14/20   AlAline AugustMD  ELIQUIS 5 MG TABS tablet TAKE 1 TABLET BY MOUTH TWICE A DAY Patient taking differently: Take 5 mg by mouth 2 (two) times daily. 03/10/20   CaWaynetta SandyMD  ferrous gluconate (FERGON) 324 MG tablet Take 324 mg by mouth 3 (three) times daily.    [provider]  gabapentin (NEURONTIN) 300 MG capsule Take 2 capsules (600 mg total) by mouth 3 (three) times daily. Patient taking differently: Take 600 mg by mouth 2 (two) times daily. 04/14/20   AlStarla Link  Kshitiz, MD  guaifenesin (ROBITUSSIN) 100 MG/5ML syrup Take 200 mg by mouth every 4 (four) hours as needed for cough.    [provider]  HYDROcodone-acetaminophen (NORCO) 7.5-325 MG tablet Take 1 tablet by mouth every 6 (six) hours as needed for moderate pain.    [provider]  Insulin Glargine (BASAGLAR KWIKPEN) 100 UNIT/ML SOPN Inject 27 Units into the skin at bedtime. 10/11/17   [provider]  LACTOBACILLUS PO Take 1 capsule by mouth daily at 6 (six) AM.    [provider]  Lidocaine HCl-Benzyl Alcohol (SALONPAS LIDOCAINE PLUS) 4-10 % CREA Apply topically. Pain relief Patch apply 9a apply patch to Lower back and shoulder ( apply in morning, remove in Evening)    [provider]  lisinopril-hydrochlorothiazide (ZESTORETIC) 10-12.5 MG tablet Take 1 tablet by mouth daily. 11/20/20   [provider]  loperamide (IMODIUM) 2 MG capsule Take 2 mg by mouth as needed for diarrhea or loose stools. Patient not taking: Reported on 12/27/2020    [provider]  Nitrofurantoin Monohyd Macro (MACROBID PO) Take 50 mg by mouth at bedtime.    [provider]  nitrofurantoin, macrocrystal-monohydrate, (MACROBID) 100 MG capsule Take 100 mg by mouth 2 (two) times daily. 12/20/20   [provider]  NOVOLOG 100 UNIT/ML injection Inject 0-8 Units into the skin daily as needed for other. Check daily at 0730am 0-200 0 units 201-250 2 units 251-300 4 units 301-350 6 units 351-400 8 units > 400 call MD 07/25/20   [provider]  nystatin (MYCOSTATIN/NYSTOP) powder Apply 1 application topically as needed. For skin folds    [provider]  omeprazole (PRILOSEC) 20 MG capsule Take 20 mg by mouth at bedtime.    [provider]  oxyCODONE (OXY IR/ROXICODONE) 5 MG immediate release tablet Take 1-2 tablets (5-10 mg total) by mouth every 4 (four) hours as needed for severe pain. 05/02/21   Ladell Pier, MD  polyethylene glycol powder (GLYCOLAX/MIRALAX) 17 GM/SCOOP powder Take 17 g by mouth in the morning and at bedtime. Patient not taking: Reported on 12/27/2020 04/14/20   Aline August, MD  promethazine (PHENERGAN) 25 MG tablet Take 25 mg by mouth every 6 (six) hours as needed for nausea or vomiting. Patient not taking: Reported on 12/27/2020    [provider]  senna-docusate (SENOKOT-S) 8.6-50 MG tablet Take 1 tablet by mouth 2 (two) times daily as needed for moderate constipation. 04/14/20   Aline August, MD  sodium chloride (OCEAN) 0.65 % SOLN nasal spray Place 2 sprays into both nostrils daily.    [provider]    Physical Exam: Vitals:   05/12/21 1900 05/12/21 2000 05/12/21 2051 05/12/21 2100  BP: (!) 111/92  120/64 95/79  Pulse: 86 84 82 81  Resp: (!) '22  18 17  ' Temp:  98 F (36.7 C)    TempSrc:  Oral    SpO2: 100% 100% 96% 99%   Physical Exam Constitutional:      General: She is not in acute distress.    Appearance: She is obese.     Comments: Ill-appearing elderly female  HENT:     Head: Normocephalic and atraumatic.     Mouth/Throat:     Mouth: Mucous membranes are moist.     Pharynx: Oropharynx is clear.  Eyes:     Extraocular Movements: Extraocular movements intact.     Pupils: Pupils are equal, round, and  reactive to light.  Cardiovascular:  Rate and Rhythm: Normal rate and regular rhythm.     Pulses: Normal pulses.     Heart sounds: Normal heart sounds.  Pulmonary:     Effort: Pulmonary effort is normal. No respiratory distress.     Breath sounds: Normal breath sounds.  Abdominal:     General: Bowel sounds are normal. There is no distension.     Palpations: Abdomen is soft.     Tenderness: There is no abdominal tenderness.  Musculoskeletal:        General: No swelling or deformity.  Skin:    General: Skin is warm and dry.  Neurological:     General: No focal deficit present.     Comments: Chronic tremor present. Alert and oriented to person and year but not place or situation.   Labs on Admission: I have personally reviewed following labs and imaging studies  CBC: Recent Labs  Lab 05/12/21 1524  WBC 8.3  NEUTROABS 5.5  HGB 11.0*  HCT 36.2  MCV 96.8  PLT 355    Basic Metabolic Panel: Recent Labs  Lab 05/12/21 1524  NA 137  K 4.1  CL 104  CO2 21*  GLUCOSE 100*  BUN 83*  CREATININE 3.99*  CALCIUM 8.9    GFR: CrCl cannot be calculated (Unknown ideal weight.).  Liver Function Tests: Recent Labs  Lab 05/12/21 1524  AST 47*  ALT 27  ALKPHOS 120  BILITOT 1.1  PROT 6.8  ALBUMIN 3.1*    Urine analysis:    Component Value Date/Time   COLORURINE AMBER (A) 05/12/2021 1806   APPEARANCEUR CLOUDY (A) 05/12/2021 1806   LABSPEC 1.017 05/12/2021 1806   PHURINE 5.0 05/12/2021 1806   GLUCOSEU NEGATIVE 05/12/2021 1806   HGBUR LARGE (A) 05/12/2021 1806   BILIRUBINUR NEGATIVE 05/12/2021 1806   KETONESUR 5 (A) 05/12/2021 1806   PROTEINUR 30 (A) 05/12/2021 1806   NITRITE NEGATIVE 05/12/2021 1806   LEUKOCYTESUR LARGE (A) 05/12/2021 1806    Radiological Exams on Admission: CT Head Wo Contrast  Result Date: 05/12/2021 CLINICAL DATA:  Altered mental status. EXAM: CT HEAD WITHOUT CONTRAST TECHNIQUE: Contiguous axial images were obtained from the base of the  skull through the vertex without intravenous contrast. RADIATION DOSE REDUCTION: This exam was performed according to the departmental dose-optimization program which includes automated exposure control, adjustment of the mA and/or kV according to patient size and/or use of iterative reconstruction technique. COMPARISON:  April 09, 2007 FINDINGS: Brain: There is mild cerebral atrophy with widening of the extra-axial spaces and ventricular dilatation. There are areas of decreased attenuation within the white matter tracts of the supratentorial brain, consistent with microvascular disease changes. Vascular: No hyperdense vessel or unexpected calcification. Skull: Normal. Negative for fracture or focal lesion. Sinuses/Orbits: There is marked severity bilateral ethmoid sinus, sphenoid sinus and left-sided frontal sinus mucosal thickening. Other: None. IMPRESSION: 1. No acute intracranial abnormality. 2. Generalized cerebral atrophy with microvascular disease changes within the white matter tracts of the supratentorial brain. 3. Marked severity bilateral ethmoid sinus, sphenoid sinus and left-sided frontal sinus disease. Electronically Signed   By: Virgina Norfolk M.D.   On: 05/12/2021 20:05   DG Chest Port 1 View  Result Date: 05/12/2021 CLINICAL DATA:  Weakness. EXAM: PORTABLE CHEST 1 VIEW COMPARISON:  April 30, 2019. FINDINGS: Stable cardiomediastinal silhouette. Left lung is clear. Mild right basilar subsegmental atelectasis or possibly infiltrate is noted. The visualized skeletal structures are unremarkable. IMPRESSION: Mild right basilar subsegmental atelectasis or possibly infiltrate. Aortic Atherosclerosis (ICD10-I70.0). Electronically Signed  By: Marijo Conception M.D.   On: 05/12/2021 15:43    EKG: Not obtained in the ED.  Assessment/Plan Principal Problem:   Acute encephalopathy Active Problems:   Adenocarcinoma of sigmoid colon (HCC)   GERD without esophagitis   Mixed diabetic  hyperlipidemia associated with type 2 diabetes mellitus (HCC)   Type 2 diabetes mellitus with hyperglycemia, with long-term current use of insulin (HCC)   Iron deficiency anemia   Cirrhosis (HCC)   MDD (major depressive disorder), recurrent episode, moderate (HCC)   AKI (acute kidney injury) ([place])  Acute encephalopathy > Patient presenting for presenting from facility with increasing confusion and decreased p.o. intake.  Ongoing for a few days. > Likely multifactorial in the setting of decreased p.o. intake and evidence of AKI as below.  Also possible pneumonia versus UTI as below.  Though no leukocytosis. > Does have history of cirrhosis not on lactulose, does have history of hepatic encephalopathy. > Electrolytes currently stable..  CT head without acute abnormality. - Monitor on telemetry/progressive - 2 L of IV fluids in the ED, will continue with rate of IV fluids - Continue with treatment of possible infections with antibiotics as below - Start lactulose for possible metabolic encephalopathy - Hold central acting medications including recently started oxycodone as well as Celexa, gabapentin, amantadine  AKI > Reports decreased p.o. intake. > Presented with creatinine of 3.99 up from baseline of 0.8.  BUN 83.  Suspect prerenal. > 2 L received in the ED, will continue with IV fluids - Monitor on telemetry - Trend renal function electrolytes - Avoid nephrotoxic agents - Continue with IV fluids overnight  ?  Pneumonia ?  UTI > Patient with evidence of atelectasis versus infiltrate at the right on chest x-ray and urinalysis with leukocytes and few bacteria.  No significant leukocytosis.  In the setting of patient's tachycardia, tachypnea, and intermittent hypotension and altered mental status treatment started in the ED with ceftriaxone azithromycin. - Continue with ceftriaxone azithromycin for now - Follow fever curve and white count  Cirrhosis > History of cirrhosis and per chart  review has had a history of encephalopathy, varices, hydrothorax. > Per records reviewed not currently on diuretics.  Labs significant only for mildly elevated AST at 47.  Albumin 3.1.  ALT alk phos and bilirubin normal.  Coagulation studies not checked in ED. - Start lactulose considering altered mental status - Trend labs  Diabetes > Insulin 16 units nightly and SSI at facility - Continue with insulin at 5 units nightly - SSI   Hypertension > Intermittent hypotension in the ED.  Improved in the 100s to 120s now. - Continue home carvedilol - Hold home lisinopril and hydrochlorothiazide for now considering issues with blood pressure and AKI.  Colon cancer with bony mets - Does not appear to be currently on any systemic therapy - Holding home oxycodone that is for cancer pain given altered mental status as above  GERD - Continue PPI  Depression - Hold home Celexa in setting of abdominal status as above  Anemia > History of iron eventually anemia with hemoglobin stable at 11. - Trend CBC  DVT prophylaxis: Eliquis Code Status:   Full Family Communication:  Sister, [name], updated by phone Disposition Plan:   Patient is from:  [place] to:  Same as above  Anticipated DC date:  1 to 5 days  Anticipated DC barriers: None  Consults called:  None  Admission status:  Observation, progressive   Severity  of Illness: The appropriate patient status for this patient is OBSERVATION. Observation status is judged to be reasonable and necessary in order to provide the required intensity of service to ensure the patient's safety. The patient's presenting symptoms, physical exam findings, and initial radiographic and laboratory data in the context of their medical condition is felt to place them at decreased risk for further clinical deterioration. Furthermore, it is anticipated that the patient will be medically stable for discharge from the hospital within 2  midnights of admission.    Marcelyn Bruins MD [facility]  How to contact the [facility] Attending or Consulting provider [place] or covering provider during after hours [place], for this patient?   Check the care team in [facility] and look for a) attending/consulting [facility] provider listed and b) the [facility] team listed Log into [email or web address] and use [place] universal password to access. If you do not have the password, please contact the hospital operator. Locate the [facility] provider you are looking for under [facility] and page to a number that you can be directly reached. If you still have difficulty reaching the provider, please page the [facility] (Director on Call) for the Hospitalists listed on amion for assistance.  05/12/2021, 9:16 PM

## 2021-05-13 DIAGNOSIS — G893 Neoplasm related pain (acute) (chronic): Secondary | ICD-10-CM | POA: Diagnosis present

## 2021-05-13 DIAGNOSIS — R262 Difficulty in walking, not elsewhere classified: Secondary | ICD-10-CM | POA: Diagnosis present

## 2021-05-13 DIAGNOSIS — B964 Proteus (mirabilis) (morganii) as the cause of diseases classified elsewhere: Secondary | ICD-10-CM | POA: Diagnosis present

## 2021-05-13 DIAGNOSIS — Z20822 Contact with and (suspected) exposure to covid-19: Secondary | ICD-10-CM | POA: Diagnosis present

## 2021-05-13 DIAGNOSIS — G9341 Metabolic encephalopathy: Secondary | ICD-10-CM | POA: Diagnosis present

## 2021-05-13 DIAGNOSIS — I10 Essential (primary) hypertension: Secondary | ICD-10-CM | POA: Diagnosis present

## 2021-05-13 DIAGNOSIS — J189 Pneumonia, unspecified organism: Secondary | ICD-10-CM | POA: Diagnosis present

## 2021-05-13 DIAGNOSIS — I959 Hypotension, unspecified: Secondary | ICD-10-CM | POA: Diagnosis present

## 2021-05-13 DIAGNOSIS — N179 Acute kidney failure, unspecified: Secondary | ICD-10-CM | POA: Diagnosis present

## 2021-05-13 DIAGNOSIS — Z66 Do not resuscitate: Secondary | ICD-10-CM | POA: Diagnosis not present

## 2021-05-13 DIAGNOSIS — K219 Gastro-esophageal reflux disease without esophagitis: Secondary | ICD-10-CM | POA: Diagnosis present

## 2021-05-13 DIAGNOSIS — F331 Major depressive disorder, recurrent, moderate: Secondary | ICD-10-CM | POA: Diagnosis present

## 2021-05-13 DIAGNOSIS — E1169 Type 2 diabetes mellitus with other specified complication: Secondary | ICD-10-CM | POA: Diagnosis present

## 2021-05-13 DIAGNOSIS — L89321 Pressure ulcer of left buttock, stage 1: Secondary | ICD-10-CM | POA: Diagnosis present

## 2021-05-13 DIAGNOSIS — D509 Iron deficiency anemia, unspecified: Secondary | ICD-10-CM | POA: Diagnosis present

## 2021-05-13 DIAGNOSIS — K7460 Unspecified cirrhosis of liver: Secondary | ICD-10-CM | POA: Diagnosis present

## 2021-05-13 DIAGNOSIS — C7951 Secondary malignant neoplasm of bone: Secondary | ICD-10-CM | POA: Diagnosis present

## 2021-05-13 DIAGNOSIS — L89311 Pressure ulcer of right buttock, stage 1: Secondary | ICD-10-CM | POA: Diagnosis present

## 2021-05-13 DIAGNOSIS — Z515 Encounter for palliative care: Secondary | ICD-10-CM | POA: Diagnosis not present

## 2021-05-13 DIAGNOSIS — G9340 Encephalopathy, unspecified: Secondary | ICD-10-CM | POA: Diagnosis present

## 2021-05-13 DIAGNOSIS — Z6834 Body mass index (BMI) 34.0-34.9, adult: Secondary | ICD-10-CM | POA: Diagnosis not present

## 2021-05-13 DIAGNOSIS — R627 Adult failure to thrive: Secondary | ICD-10-CM | POA: Diagnosis present

## 2021-05-13 DIAGNOSIS — N390 Urinary tract infection, site not specified: Secondary | ICD-10-CM | POA: Diagnosis present

## 2021-05-13 DIAGNOSIS — E669 Obesity, unspecified: Secondary | ICD-10-CM | POA: Diagnosis present

## 2021-05-13 DIAGNOSIS — E1165 Type 2 diabetes mellitus with hyperglycemia: Secondary | ICD-10-CM | POA: Diagnosis present

## 2021-05-13 LAB — BLOOD CULTURE ID PANEL (REFLEXED) - BCID2

## 2021-05-13 LAB — COMPREHENSIVE METABOLIC PANEL
ALT: 30 U/L (ref 0–44)
AST: 65 U/L — ABNORMAL HIGH (ref 15–41)
Albumin: 3.1 g/dL — ABNORMAL LOW (ref 3.5–5.0)
Alkaline Phosphatase: 118 U/L (ref 38–126)
Anion gap: 7 (ref 5–15)
BUN: 63 mg/dL — ABNORMAL HIGH (ref 8–23)
CO2: 22 mmol/L (ref 22–32)
Calcium: 8.9 mg/dL (ref 8.9–10.3)
Chloride: 111 mmol/L (ref 98–111)
Creatinine, Ser: 1.34 mg/dL — ABNORMAL HIGH (ref 0.44–1.00)
GFR, Estimated: 42 mL/min — ABNORMAL LOW (ref >60)
Glucose, Bld: 85 mg/dL (ref 70–99)
Potassium: 4.8 mmol/L (ref 3.5–5.1)
Sodium: 140 mmol/L (ref 135–145)
Total Bilirubin: 1.5 mg/dL — ABNORMAL HIGH (ref 0.3–1.2)
Total Protein: 6.8 g/dL (ref 6.5–8.1)

## 2021-05-13 LAB — CBC
HCT: 33.9 % — ABNORMAL LOW (ref 36.0–46.0)
Hemoglobin: 10.2 g/dL — ABNORMAL LOW (ref 12.0–15.0)
MCH: 29.5 pg (ref 26.0–34.0)
MCHC: 30.1 g/dL (ref 30.0–36.0)
MCV: 98 fL (ref 80.0–100.0)
Platelets: 185 10*3/uL (ref 150–400)
RBC: 3.46 MIL/uL — ABNORMAL LOW (ref 3.87–5.11)
RDW: 16.6 % — ABNORMAL HIGH (ref 11.5–15.5)
WBC: 6.8 10*3/uL (ref 4.0–10.5)
nRBC: 0 % (ref 0.0–0.2)

## 2021-05-13 LAB — GLUCOSE, CAPILLARY
Glucose-Capillary: 87 mg/dL (ref 70–99)
Glucose-Capillary: 93 mg/dL (ref 70–99)

## 2021-05-13 LAB — PROTIME-INR
INR: 2.4 — ABNORMAL HIGH (ref 0.8–1.2)
Prothrombin Time: 26.1 seconds — ABNORMAL HIGH (ref 11.4–15.2)

## 2021-05-13 LAB — AMMONIA: Ammonia: 43 umol/L — ABNORMAL HIGH (ref 9–35)

## 2021-05-13 LAB — CBG MONITORING, ED
Glucose-Capillary: 92 mg/dL (ref 70–99)
Glucose-Capillary: 99 mg/dL (ref 70–99)
Glucose-Capillary: 94 mg/dL (ref 70–99)

## 2021-05-13 MED ORDER — SODIUM CHLORIDE 0.9 % IV SOLN
500.0000 mg | Freq: Once | INTRAVENOUS | Status: AC
  Administered 2021-05-13: 500 mg via INTRAVENOUS
  Filled 2021-05-13: qty 5

## 2021-05-13 MED ORDER — DEXTROSE 5 % IV SOLN
250.0000 mg | INTRAVENOUS | Status: DC
  Administered 2021-05-14 – 2021-05-16 (×3): 250 mg via INTRAVENOUS
  Filled 2021-05-13 (×5): qty 2.5

## 2021-05-13 MED ORDER — SODIUM CHLORIDE 0.9 % IV SOLN
2.0000 g | INTRAVENOUS | Status: DC
  Administered 2021-05-13 – 2021-05-15 (×3): 2 g via INTRAVENOUS
  Filled 2021-05-13 (×3): qty 20

## 2021-05-13 MED ORDER — DEXTROSE 5 % IV SOLN
250.0000 mg | INTRAVENOUS | Status: DC
  Filled 2021-05-13 (×2): qty 2.5

## 2021-05-13 NOTE — Plan of Care (Signed)
  Problem: Safety: Goal: Ability to remain free from injury will improve Outcome: Progressing   Problem: Skin Integrity: Goal: Risk for impaired skin integrity will decrease Outcome: Progressing   

## 2021-05-13 NOTE — Progress Notes (Signed)
PHARMACY - PHYSICIAN COMMUNICATION CRITICAL VALUE ALERT - BLOOD CULTURE IDENTIFICATION (BCID)  Tonya Dougherty is an 71 y.o. female who presented to [facility] on 05/12/2021 with a chief complaint of worsening mental status. Head CT: no acute process, no metastases noted  Assessment:  1/13 Blood cx x2 obtained, BCID reported 1/14 with one set + for Staph epi, mecA positive  Afebrile, WBC wnl CXray: mild R atalectasis or possible infiltrate  Pt does not appear septic at this time, one set only positive - would consider contaminant  Name of physician (or Provider) Contacted: Dr Benny Lennert  Current antibiotics: Currently on Ceftriaxone/Azithromycin for CAP  Changes to prescribed antibiotics recommended: No change   Results for orders placed or performed during the hospital encounter of 05/12/21  Blood Culture ID Panel (Reflexed) (Collected: 05/12/2021  3:59 PM)  Result Value Ref Range   Enterococcus faecalis NOT DETECTED NOT DETECTED   Enterococcus Faecium NOT DETECTED NOT DETECTED   Listeria monocytogenes NOT DETECTED NOT DETECTED   Staphylococcus species DETECTED (A) NOT DETECTED   Staphylococcus aureus (BCID) NOT DETECTED NOT DETECTED   Staphylococcus epidermidis DETECTED (A) NOT DETECTED   Staphylococcus lugdunensis NOT DETECTED NOT DETECTED   Streptococcus species NOT DETECTED NOT DETECTED   Streptococcus agalactiae NOT DETECTED NOT DETECTED   Streptococcus pneumoniae NOT DETECTED NOT DETECTED   Streptococcus pyogenes NOT DETECTED NOT DETECTED   A.calcoaceticus-baumannii NOT DETECTED NOT DETECTED   Bacteroides fragilis NOT DETECTED NOT DETECTED   Enterobacterales NOT DETECTED NOT DETECTED   Enterobacter cloacae complex NOT DETECTED NOT DETECTED   Escherichia coli NOT DETECTED NOT DETECTED   Klebsiella aerogenes NOT DETECTED NOT DETECTED   Klebsiella oxytoca NOT DETECTED NOT DETECTED   Klebsiella pneumoniae NOT DETECTED NOT DETECTED   Proteus species NOT DETECTED NOT DETECTED    Salmonella species NOT DETECTED NOT DETECTED   Serratia marcescens NOT DETECTED NOT DETECTED   Haemophilus influenzae NOT DETECTED NOT DETECTED   Neisseria meningitidis NOT DETECTED NOT DETECTED   Pseudomonas aeruginosa NOT DETECTED NOT DETECTED   Stenotrophomonas maltophilia NOT DETECTED NOT DETECTED   Candida albicans NOT DETECTED NOT DETECTED   Candida auris NOT DETECTED NOT DETECTED   Candida glabrata NOT DETECTED NOT DETECTED   Candida krusei NOT DETECTED NOT DETECTED   Candida parapsilosis NOT DETECTED NOT DETECTED   Candida tropicalis NOT DETECTED NOT DETECTED   Cryptococcus neoformans/gattii NOT DETECTED NOT DETECTED   Methicillin resistance mecA/C DETECTED (A) NOT DETECTED    [name] 05/13/2021  4:43 PM

## 2021-05-13 NOTE — ED Notes (Signed)
ED RN spoke to Pharmacist, medications not verified yet, ED RN unable to give meds until pharmacy verifies medication.  Per pharmacist, med tech needs to preform med hx before meds can be verified.

## 2021-05-13 NOTE — Progress Notes (Signed)
PROGRESS NOTE  Tonya Dougherty WUJ:[id number] DOB: 29-Jun-1950 DOA: 05/12/2021 PCP: Gay Filler, MD  Brief History   Tonya Dougherty is a 71 y.o. female with medical history significant of diabetes, obesity, hyperlipidemia, depression, anemia, GERD, history of DVT, hypertension, cirrhosis, colon cancer with bony mets, diverticulosis who presents with worsening mental status.  History obtained with assistance of family chart review due to patient's encephalopathy.  Patient has reportedly had some worsening confusion for the past few days at her facility.  Also with decreased p.o. intake.  Sister confirmed that patient has been confused and continue to be confused today but was able to communicate.  Family has some concerns about patient's metastatic colon cancer possibly having a role.  Patient has had decreased p.o. intake recently.  Was noted to have low blood pressure EMS received a 500 cc bolus. Patient also reportedly recently started on oxycodone for cancer pain.   Patient unable to provide full review of systems given altered mentation.  She does states that she feels "okay "   ED Course: Vital signs in the ED significant for blood pressure initially in the 95A to 21H systolic now improved to the 086-578 46N systolic.  Heart rate in the 80s to 110s, respiratory rate in the teens to 20s.  Lab work-up showed CMP with BUN 83 and creatinine elevated to 3.99 from baseline of 0.8.  Albumin 3.1, AST 47.  CBC with hemoglobin stable 11.  Lactic acid normal on first check with recheck pending.  Respiratory panel for flu and COVID-negative.  Urinalysis with hemoglobin, ketones, protein, leukocytes, few bacteria.  Blood cultures and urine cultures pending.  Chest x-ray showed mild right subsegmental atelectasis likely infiltrate.  CT head showed no acute abnormality but did show significant sinus disease.  Ammonia level pending.  Patient received ceftriaxone azithromycin in the ED as well as 2 L of IV  fluids.  [facility] have been consulted to admit the patient for further evaluation and treatment.  Consultants    Procedures    Antibiotics   Anti-infectives (From admission, onward)    Start     Dose/Rate Route Frequency Ordered Stop   05/14/21 1800  azithromycin (ZITHROMAX) 250 mg in dextrose 5 % 125 mL IVPB        250 mg 127.5 mL/hr over 60 Minutes Intravenous Every 24 hours 05/13/21 1752     05/13/21 2200  cefTRIAXone (ROCEPHIN) 2 g in sodium chloride 0.9 % 100 mL IVPB  Status:  Discontinued        2 g 200 mL/hr over 30 Minutes Intravenous Every 24 hours 05/12/21 2104 05/13/21 1745   05/13/21 2200  azithromycin (ZITHROMAX) 250 mg in dextrose 5 % 125 mL IVPB  Status:  Discontinued        250 mg 127.5 mL/hr over 60 Minutes Intravenous Every 24 hours 05/12/21 2104 05/13/21 1745   05/13/21 1845  azithromycin (ZITHROMAX) 250 mg in dextrose 5 % 125 mL IVPB  Status:  Discontinued        250 mg 127.5 mL/hr over 60 Minutes Intravenous Every 24 hours 05/13/21 1745 05/13/21 1752   05/13/21 1845  cefTRIAXone (ROCEPHIN) 2 g in sodium chloride 0.9 % 100 mL IVPB        2 g 200 mL/hr over 30 Minutes Intravenous Every 24 hours 05/13/21 1745     05/13/21 1845  azithromycin (ZITHROMAX) 500 mg in sodium chloride 0.9 % 250 mL IVPB        500 mg 250 mL/hr  over 60 Minutes Intravenous  Once 05/13/21 1752 05/14/21 0054   05/12/21 1930  cefTRIAXone (ROCEPHIN) 1 g in sodium chloride 0.9 % 100 mL IVPB  Status:  Discontinued        1 g 200 mL/hr over 30 Minutes Intravenous  Once 05/12/21 1917 05/13/21 1745   05/12/21 1930  azithromycin (ZITHROMAX) 500 mg in sodium chloride 0.9 % 250 mL IVPB  Status:  Discontinued        500 mg 250 mL/hr over 60 Minutes Intravenous  Once 05/12/21 1925 05/13/21 1745      Subjective  The patient is resting comfortably. No new complaints.   Objective   Vitals:  Vitals:   05/13/21 1754 05/13/21 1754  BP: (!) 149/80 (!) 149/80  Pulse: 91 91  Resp: 20 20   Temp: 98.6 F (37 C) 98.6 F (37 C)  SpO2:  94%    Exam:  Constitutional:  The patient is awake, alert, and oriented x 3. No acute distress. Respiratory:  No increased work of breathing. No wheezes, rales, or rhonchi No tactile fremitus Cardiovascular:  Regular rate and rhythm No murmurs, ectopy, or gallups. No lateral PMI. No thrills. Abdomen:  Abdomen is soft, non-tender, non-distended No hernias, masses, or organomegaly Normoactive bowel sounds.  Musculoskeletal:  No cyanosis, clubbing, or edema Skin:  No rashes, lesions, ulcers palpation of skin: no induration or nodules Neurologic:  CN 2-12 intact Sensation all 4 extremities intact Psychiatric:  Mental status Mood, affect appropriate Orientation to person, place, time  judgment and insight appear intact   I have personally reviewed the following:   Today's Data  Vitals  Lab Data  CBC, BMP  Micro Data  Blood culture: 1/2 positive for staph species. Likely contaminant. Will repeat blood cultures  Imaging  CT head CXR  Cardiology Data    Other Data    Scheduled Meds:  apixaban  5 mg Oral BID   carvedilol  3.125 mg Oral BID WC   insulin aspart  0-9 Units Subcutaneous TID WC   lactulose  10 g Oral Daily   nystatin  1 application Topical TID   pantoprazole  40 mg Oral Daily   sodium chloride flush  3 mL Intravenous Q12H   Continuous Infusions:  [START ON 05/14/2021] azithromycin     azithromycin     cefTRIAXone (ROCEPHIN)  IV 2 g (05/13/21 1907)    Principal Problem:   Acute encephalopathy Active Problems:   Adenocarcinoma of sigmoid colon (HCC)   GERD without esophagitis   Mixed diabetic hyperlipidemia associated with type 2 diabetes mellitus (HCC)   Type 2 diabetes mellitus with hyperglycemia, with long-term current use of insulin (HCC)   Iron deficiency anemia   Cirrhosis (HCC)   MDD (major depressive disorder), recurrent episode, moderate (HCC)   AKI (acute kidney injury) ([place])    Acute metabolic encephalopathy   LOS: 0 days   A & P  Acute encephalopathy > Patient presenting for presenting from facility with increasing confusion and decreased p.o. intake.  Ongoing for a few days. Pt remains confused this morning. > Likely multifactorial in the setting of decreased p.o. intake and evidence of AKI as below.  Also possible pneumonia versus UTI as below.  Though no leukocytosis. > Does have history of cirrhosis not on lactulose, does have history of hepatic encephalopathy. Will start lactulose. > Electrolytes currently stable..  CT head without acute abnormality. - Monitor on telemetry/progressive - 2 L of IV fluids in the ED, will continue with  rate of IV fluids - Continue with treatment of possible infections with antibiotics as below - Hold central acting medications including recently started oxycodone as well as Celexa, gabapentin, amantadine   AKI > Reports decreased p.o. intake. > Presented with creatinine of 3.99 up from baseline of 0.8.  BUN 83.  Suspect prerenal. Creatinine improved to 1.34 this morning.  > 2 L received in the ED, will continue with IV fluids - Monitor on telemetry Monitor creatinine, electrolytes, and volume status.  - Avoid nephrotoxic agents and hypotension. - Continue with IV fluids overnight  Pneumonia: CXR suggests atelectasis vs pneumonia. No reports of fevers or leukocytosis. Will check procalcitonin.  Continue Rocephin and Azithromycin.   UTI: Urinalysis strongly suggestive of UTI. Urine culture is pending. Continue rocephin.  Cirrhosis: History of cirrhosis and per chart review has had a history of encephalopathy, varices, hydrothorax. Per records reviewed not currently on diuretics.  Labs significant only for mildly elevated AST at 47.  Albumin 3.1.  ALT alk phos and bilirubin normal.  Coagulation studies not checked in ED. Start lactulose considering altered mental status Monitor LFT's.   Diabetes > Insulin 16 units nightly  and SSI at facility - Continue with insulin at 5 units nightly - SSI    Hypertension > Intermittent hypotension in the ED.  Improved in the 100s to 120s now. - Continue home carvedilol - Hold home lisinopril and hydrochlorothiazide for now considering issues with blood pressure and AKI.   Colon cancer with bony mets - Does not appear to be currently on any systemic therapy - Holding home oxycodone that is for cancer pain given altered mental status as above   GERD - Continue PPI  Depression - Hold home Celexa in setting of abdominal status as above   Anemia > History of iron eventually anemia with hemoglobin stable at 11. - Trend CBC  I have seen and examined this patient myself. I have spent 36 minutes in her evaluation and care.   DVT prophylaxis:      Eliquis Code Status:              Full Family Communication:       None available Disposition Plan:              Patient is from:                        [place] to:                   Same as above             Anticipated DC date:               1 to 5 days             Anticipated DC barriers:         None     Dulcemaria Bula, DO [facility] Direct contact: see [email or web address]  7PM-7AM contact night coverage as above 05/13/2021, 7:38 PM  LOS: 0 days

## 2021-05-13 NOTE — ED Notes (Signed)
Change pt linen gave pt new purewick and cleaned pt up reposition pt

## 2021-05-13 NOTE — Plan of Care (Signed)
Discussed with patient plan of care for evening, pain management areason for safety mitts with no evidence of learning.  Problem: Education: Goal: Knowledge of General Education information will improve Description: Including pain rating scale, medication(s)/side effects and non-pharmacologic comfort measures Outcome: Progressing   Problem: Activity: Goal: Risk for activity intolerance will decrease Outcome: Progressing

## 2021-05-14 LAB — GLUCOSE, CAPILLARY
Glucose-Capillary: 104 mg/dL — ABNORMAL HIGH (ref 70–99)
Glucose-Capillary: 140 mg/dL — ABNORMAL HIGH (ref 70–99)
Glucose-Capillary: 145 mg/dL — ABNORMAL HIGH (ref 70–99)
Glucose-Capillary: 87 mg/dL (ref 70–99)
Glucose-Capillary: 90 mg/dL (ref 70–99)

## 2021-05-14 LAB — PROCALCITONIN: Procalcitonin: <0.1 ng/mL

## 2021-05-14 NOTE — Progress Notes (Signed)
Tonya Dougherty continues to remain confused- has removed mittens several times and finally her IV site in her dorsal hand- bruising is noted along fore arms and wrists from numerous IV starts and draws and the mittens seems to be adding to her agitation overall. She has removed both her tele and purwick, unaware of what they are. She is difficult to redirect, but ultimately not a danger to herself as she does not attempt to get out of bed, and once items are removed from her, returns to a restful state- Dr Kennon Holter has been paged regarding possibly holding Tele and her IV re-insertion until AM. Her next ABX isn't due until afternoon- There is ready concern that continued conflict may exacerbate her confusion and cause unreasonable distress. Frequent checks are in progress as we await Dr. Review. Pt more relaxed at current.

## 2021-05-14 NOTE — Progress Notes (Signed)
Pt resting without distress. Telemetry held at current, no IV at this time- will attempt in late AM. Dr aware. Continue freq checks and monitoring for safety.

## 2021-05-14 NOTE — Progress Notes (Deleted)
SATURATION QUALIFICATIONS: (This note is used to comply with regulatory documentation for home oxygen)  Patient Saturations on Room Air at Rest = 95%  Patient Saturations on Room Air while Ambulating = 93%  Patient Saturations on 0 Liters of oxygen while Ambulating = 0%  Please briefly explain why patient needs home oxygen: Pt not qualified for home oxygen. Maintained O2 93-95% room air.

## 2021-05-14 NOTE — Plan of Care (Signed)
  Problem: Activity: Goal: Risk for activity intolerance will decrease Outcome: Progressing   Problem: Pain Managment: Goal: General experience of comfort will improve Outcome: Progressing   Problem: Safety: Goal: Ability to remain free from injury will improve Outcome: Progressing   

## 2021-05-14 NOTE — Progress Notes (Signed)
PROGRESS NOTE  Kimmi Acocella JJO:[id number] DOB: [birth year] DOA: 05/12/2021 PCP: Gay Filler, MD  Brief History   Kaelah Hayashi is a 71 y.o. female with medical history significant of diabetes, obesity, hyperlipidemia, depression, anemia, GERD, history of DVT, hypertension, cirrhosis, colon cancer with bony mets, diverticulosis who presents with worsening mental status.  History obtained with assistance of family chart review due to patient's encephalopathy.  Patient has reportedly had some worsening confusion for the past few days at her facility.  Also with decreased p.o. intake.  Sister confirmed that patient has been confused and continue to be confused today but was able to communicate.  Family has some concerns about patient's metastatic colon cancer possibly having a role.  Patient has had decreased p.o. intake recently.  Was noted to have low blood pressure EMS received a 500 cc bolus. Patient also reportedly recently started on oxycodone for cancer pain.   Patient unable to provide full review of systems given altered mentation.  She does states that she feels "okay "   ED Course: Vital signs in the ED significant for blood pressure initially in the 16W to 10X systolic now improved to the 323-557 32K systolic.  Heart rate in the 80s to 110s, respiratory rate in the teens to 20s.  Lab work-up showed CMP with BUN 83 and creatinine elevated to 3.99 from baseline of 0.8.  Albumin 3.1, AST 47.  CBC with hemoglobin stable 11.  Lactic acid normal on first check with recheck pending.  Respiratory panel for flu and COVID-negative.  Urinalysis with hemoglobin, ketones, protein, leukocytes, few bacteria.  Blood cultures and urine cultures pending.  Chest x-ray showed mild right subsegmental atelectasis likely infiltrate.  CT head showed no acute abnormality but did show significant sinus disease.  Ammonia level pending.  Patient received ceftriaxone azithromycin in the ED as well as 2 L of IV  fluids.  [facility] have been consulted to admit the patient for further evaluation and treatment.  Consultants    Procedures    Antibiotics   Anti-infectives (From admission, onward)    Start     Dose/Rate Route Frequency Ordered Stop   05/14/21 1800  azithromycin (ZITHROMAX) 250 mg in dextrose 5 % 125 mL IVPB        250 mg 127.5 mL/hr over 60 Minutes Intravenous Every 24 hours 05/13/21 1752     05/13/21 2200  cefTRIAXone (ROCEPHIN) 2 g in sodium chloride 0.9 % 100 mL IVPB  Status:  Discontinued        2 g 200 mL/hr over 30 Minutes Intravenous Every 24 hours 05/12/21 2104 05/13/21 1745   05/13/21 2200  azithromycin (ZITHROMAX) 250 mg in dextrose 5 % 125 mL IVPB  Status:  Discontinued        250 mg 127.5 mL/hr over 60 Minutes Intravenous Every 24 hours 05/12/21 2104 05/13/21 1745   05/13/21 1845  azithromycin (ZITHROMAX) 250 mg in dextrose 5 % 125 mL IVPB  Status:  Discontinued        250 mg 127.5 mL/hr over 60 Minutes Intravenous Every 24 hours 05/13/21 1745 05/13/21 1752   05/13/21 1845  cefTRIAXone (ROCEPHIN) 2 g in sodium chloride 0.9 % 100 mL IVPB        2 g 200 mL/hr over 30 Minutes Intravenous Every 24 hours 05/13/21 1745     05/13/21 1845  azithromycin (ZITHROMAX) 500 mg in sodium chloride 0.9 % 250 mL IVPB        500 mg 250 mL/hr  over 60 Minutes Intravenous  Once 05/13/21 1752 05/14/21 0054   05/12/21 1930  cefTRIAXone (ROCEPHIN) 1 g in sodium chloride 0.9 % 100 mL IVPB  Status:  Discontinued        1 g 200 mL/hr over 30 Minutes Intravenous  Once 05/12/21 1917 05/13/21 1745   05/12/21 1930  azithromycin (ZITHROMAX) 500 mg in sodium chloride 0.9 % 250 mL IVPB  Status:  Discontinued        500 mg 250 mL/hr over 60 Minutes Intravenous  Once 05/12/21 1925 05/13/21 1745      Subjective  The patient is resting comfortably. No new complaints.   Objective   Vitals:  Vitals:   05/14/21 0618 05/14/21 1246  BP: (!) 145/65 122/72  Pulse: 85 86  Resp: 18 18   Temp: 98.3 F (36.8 C) 97.6 F (36.4 C)  SpO2: 92% 96%    Exam:  Constitutional:  The patient is awake, alert, and oriented x 3. No acute distress. Respiratory:  No increased work of breathing. No wheezes, rales, or rhonchi No tactile fremitus Cardiovascular:  Regular rate and rhythm No murmurs, ectopy, or gallups. No lateral PMI. No thrills. Abdomen:  Abdomen is soft, non-tender, non-distended No hernias, masses, or organomegaly Normoactive bowel sounds.  Musculoskeletal:  No cyanosis, clubbing, or edema Skin:  No rashes, lesions, ulcers palpation of skin: no induration or nodules Neurologic:  CN 2-12 intact Sensation all 4 extremities intact Psychiatric:  Mental status Mood, affect appropriate Orientation to person, place, time  judgment and insight appear intact   I have personally reviewed the following:   Today's Data  Vitals  Lab Data  CBC, BMP  Micro Data  Blood culture: 1/2 positive for staph species. Likely contaminant. Will repeat blood cultures  Imaging  CT head CXR  Cardiology Data    Other Data    Scheduled Meds:  apixaban  5 mg Oral BID   carvedilol  3.125 mg Oral BID WC   insulin aspart  0-9 Units Subcutaneous TID WC   lactulose  10 g Oral Daily   nystatin  1 application Topical TID   pantoprazole  40 mg Oral Daily   sodium chloride flush  3 mL Intravenous Q12H   Continuous Infusions:  azithromycin     cefTRIAXone (ROCEPHIN)  IV Stopped (05/13/21 1937)    Principal Problem:   Acute encephalopathy Active Problems:   Adenocarcinoma of sigmoid colon (HCC)   GERD without esophagitis   Mixed diabetic hyperlipidemia associated with type 2 diabetes mellitus (HCC)   Type 2 diabetes mellitus with hyperglycemia, with long-term current use of insulin (HCC)   Pressure injury of skin   Iron deficiency anemia   Cirrhosis ([place])   Debility   MDD (major depressive disorder), recurrent episode, moderate (HCC)   AKI (acute kidney  injury) ([place])   Acute metabolic encephalopathy   LOS: 1 day   A & P  Acute encephalopathy: Improved. She is oriented x 2 today, but has no idea why she is here. Likely multifactorial in the setting of decreased p.o. intake and evidence of AKI as below.  Also possible pneumonia versus UTI as below. CT head without acute abnormality.The patient is receiving lactulose for treatment of possible hepatic encephalopathy. Hold central acting medications including recently started oxycodone as well as Celexa, gabapentin, amantadine.   AKI: Creatinine is improving. 3.99 on admission. 1.34 on 05/13/2021. Monitor creatinine, electrolytes, and volume status.  Avoid nephrotoxic agents and hypotension.  Pneumonia: CXR suggests atelectasis vs pneumonia.  No reports of fevers or leukocytosis. Will check procalcitonin.  Continue Rocephin and Azithromycin.   UTI: Urinalysis strongly suggestive of UTI. Urine culture is pending. Continue rocephin.  Cirrhosis: History of cirrhosis and per chart review has had a history of encephalopathy, varices, hydrothorax. Per records reviewed not currently on diuretics.  Labs significant only for mildly elevated AST at 47.  Albumin 3.1.  ALT alk phos and bilirubin normal.  Coagulation studies not checked in ED. Start lactulose considering altered mental status Monitor LFT's.  Pressure injury to skin: Stage 1 pressure injury to bilateral buttocks. Also MASD to abdomen and bilateral lower breasts.   Diabetes > Insulin 16 units nightly and SSI at facility - Continue with insulin at 5 units nightly - SSI  -glucoses 87-140 in the last 24 hours.   Hypertension > Intermittent hypotension in the ED. Resolved.  - Continue home carvedilol.  - Normotensive   Colon cancer with bony mets - Does not appear to be currently on any systemic therapy - Oxycodone has been restarted to cover the patient's cancer related pain.   GERD - Continue PPI  Depression - Hold home Celexa in  setting of abdominal status as above   Anemia > History of iron deficiency anemia with hemoglobin stable at 11. - Trend CBC  I have seen and examined this patient myself. I have spent 34 minutes in her evaluation and care.   DVT prophylaxis:      Eliquis Code Status:              Full Family Communication:       None available Disposition Plan:              Patient is from:                        [place] to:                   Same as above             Anticipated DC date:               1 to 5 days             Anticipated DC barriers:         None     Kaeleb Emond, DO [facility] Direct contact: see [email or web address]  7PM-7AM contact night coverage as above 05/14/2021, 2:37 PM  LOS: 0 days

## 2021-05-15 ENCOUNTER — Inpatient Hospital Stay (HOSPITAL_COMMUNITY): Payer: Medicare HMO

## 2021-05-15 LAB — CBC WITH DIFFERENTIAL/PLATELET
Abs Immature Granulocytes: 0.01 10*3/uL (ref 0.00–0.07)
Basophils Absolute: 0 10*3/uL (ref 0.0–0.1)
Basophils Relative: 1 %
Eosinophils Absolute: 0.1 10*3/uL (ref 0.0–0.5)
Eosinophils Relative: 2 %
HCT: 36.5 % (ref 36.0–46.0)
Hemoglobin: 10.8 g/dL — ABNORMAL LOW (ref 12.0–15.0)
Immature Granulocytes: 0 %
Lymphocytes Relative: 25 %
Lymphs Abs: 1.2 10*3/uL (ref 0.7–4.0)
MCH: 29.1 pg (ref 26.0–34.0)
MCHC: 29.6 g/dL — ABNORMAL LOW (ref 30.0–36.0)
MCV: 98.4 fL (ref 80.0–100.0)
Monocytes Absolute: 0.7 10*3/uL (ref 0.1–1.0)
Monocytes Relative: 15 %
Neutro Abs: 2.7 10*3/uL (ref 1.7–7.7)
Neutrophils Relative %: 57 %
Platelets: 191 10*3/uL (ref 150–400)
RBC: 3.71 MIL/uL — ABNORMAL LOW (ref 3.87–5.11)
RDW: 16.8 % — ABNORMAL HIGH (ref 11.5–15.5)
WBC: 4.7 10*3/uL (ref 4.0–10.5)
nRBC: 0 % (ref 0.0–0.2)

## 2021-05-15 LAB — GLUCOSE, CAPILLARY
Glucose-Capillary: 117 mg/dL — ABNORMAL HIGH (ref 70–99)
Glucose-Capillary: 131 mg/dL — ABNORMAL HIGH (ref 70–99)
Glucose-Capillary: 139 mg/dL — ABNORMAL HIGH (ref 70–99)
Glucose-Capillary: 149 mg/dL — ABNORMAL HIGH (ref 70–99)

## 2021-05-15 LAB — BASIC METABOLIC PANEL
Anion gap: 8 (ref 5–15)
BUN: 31 mg/dL — ABNORMAL HIGH (ref 8–23)
CO2: 26 mmol/L (ref 22–32)
Calcium: 9.6 mg/dL (ref 8.9–10.3)
Chloride: 109 mmol/L (ref 98–111)
Creatinine, Ser: 0.78 mg/dL (ref 0.44–1.00)
GFR, Estimated: >60 mL/min (ref >60)
Glucose, Bld: 106 mg/dL — ABNORMAL HIGH (ref 70–99)
Potassium: 3.7 mmol/L (ref 3.5–5.1)
Sodium: 143 mmol/L (ref 135–145)

## 2021-05-15 LAB — CULTURE, BLOOD (ROUTINE X 2): Special Requests: ADEQUATE

## 2021-05-15 LAB — PROCALCITONIN: Procalcitonin: <0.1 ng/mL

## 2021-05-15 IMAGING — CT CT L SPINE W/O CM
3 of 4 series · 9 of 33 positions shown, 11 images · non-contrast
Comparison: [DATE], [DATE] [DATE], [DATE].

CLINICAL DATA: Metastatic disease evaluation.



[Series 7: axial reformats bone · axial · 0.23mm/px · z∈[-275,-275]mm · 1 of 112 slices shown, 2 images]
[im 56/112  soft-tissue]
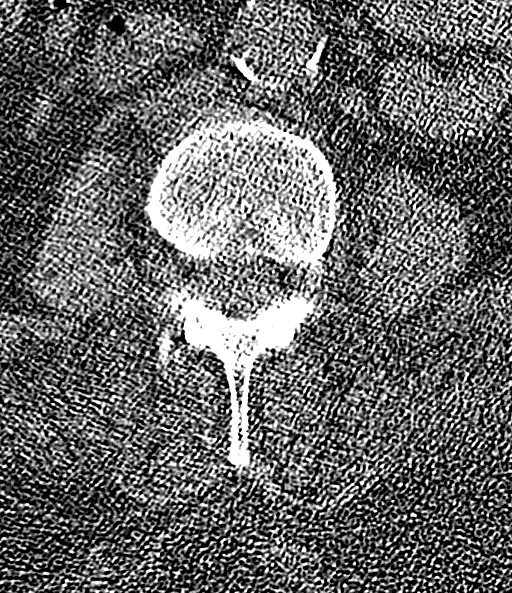
[im 56/112  bone]
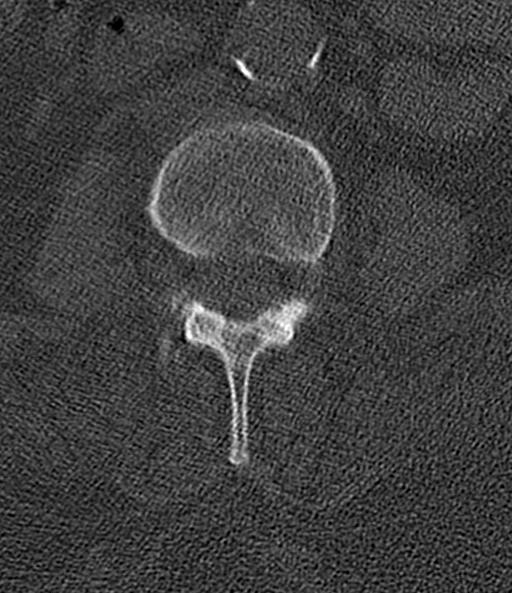

[Series 10: sagittal st · sagittal · 0.27mm/px · 5 of 61 slices shown, 6 images]
[im 21/61  bone]
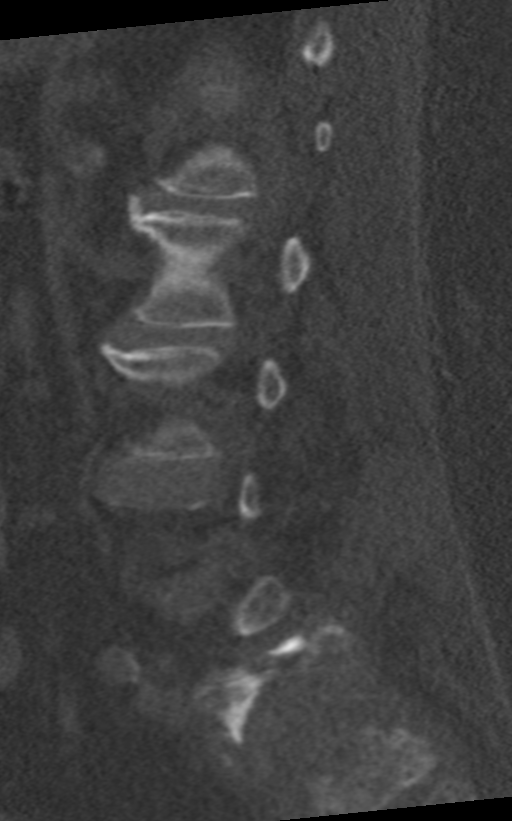
[im 26/61  bone]
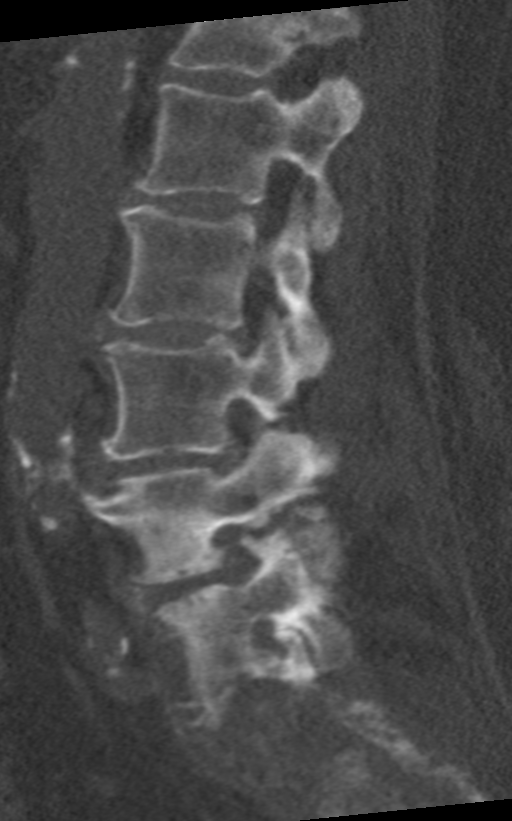
[im 31/61  soft-tissue]
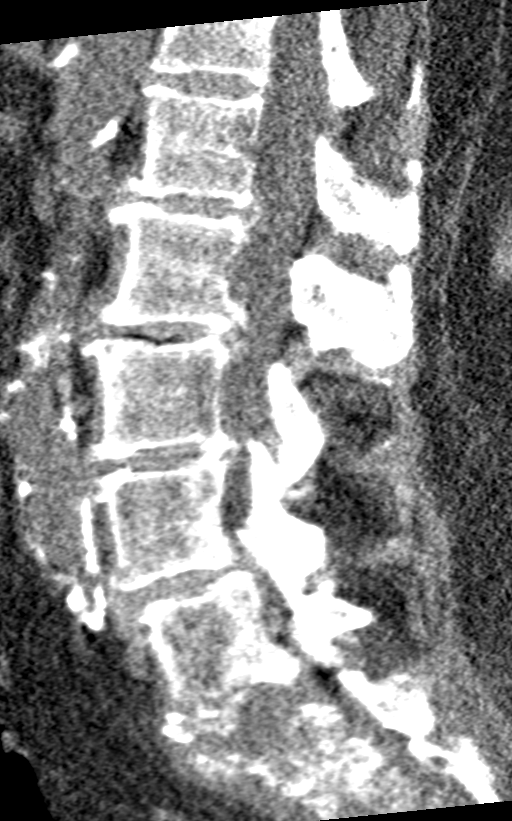
[im 31/61  bone]
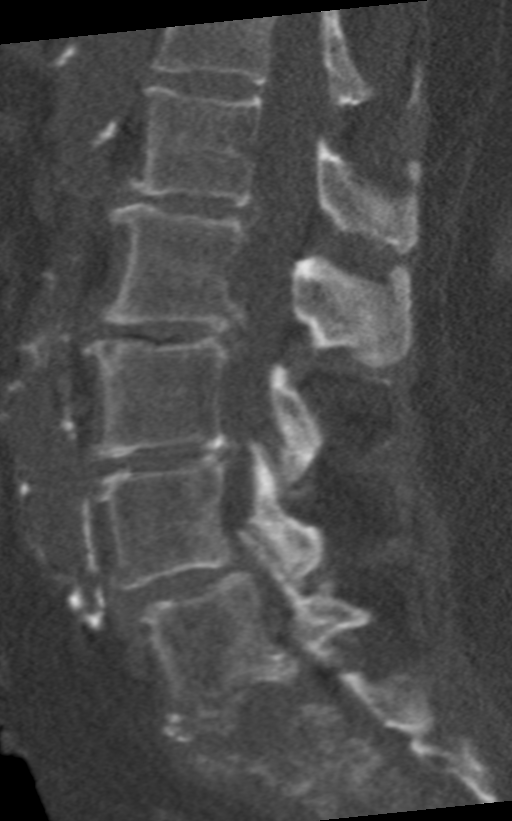
[im 36/61  bone]
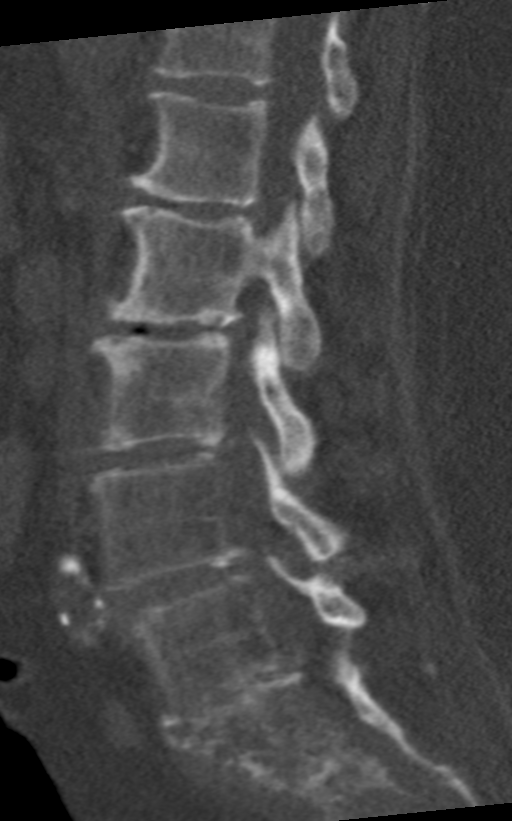
[im 41/61  bone]
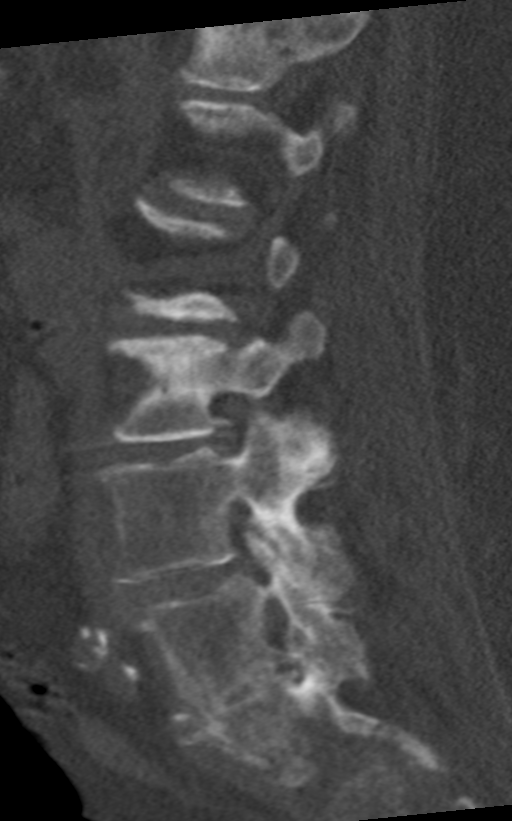

[Series 11: coronal st · coronal · 0.23mm/px · 3 of 70 slices shown]
[im 14/70  bone]
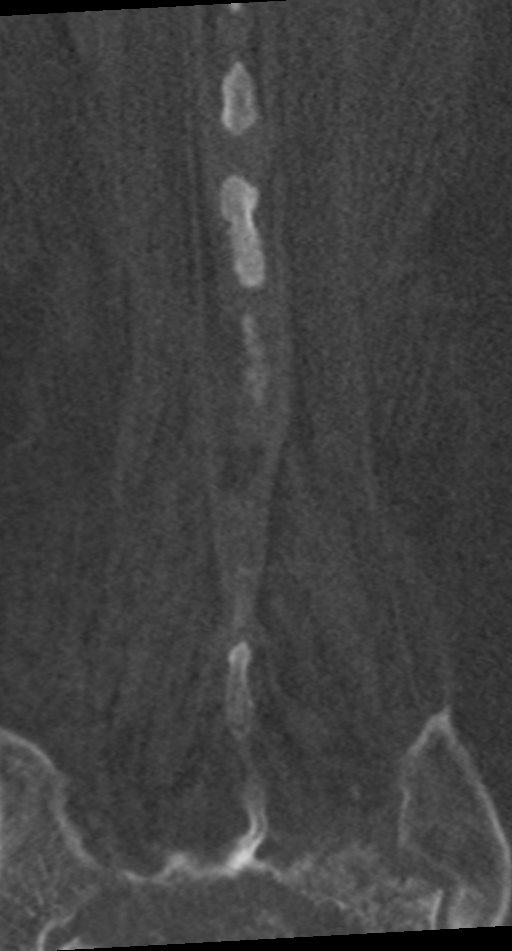
[im 28/70  bone]
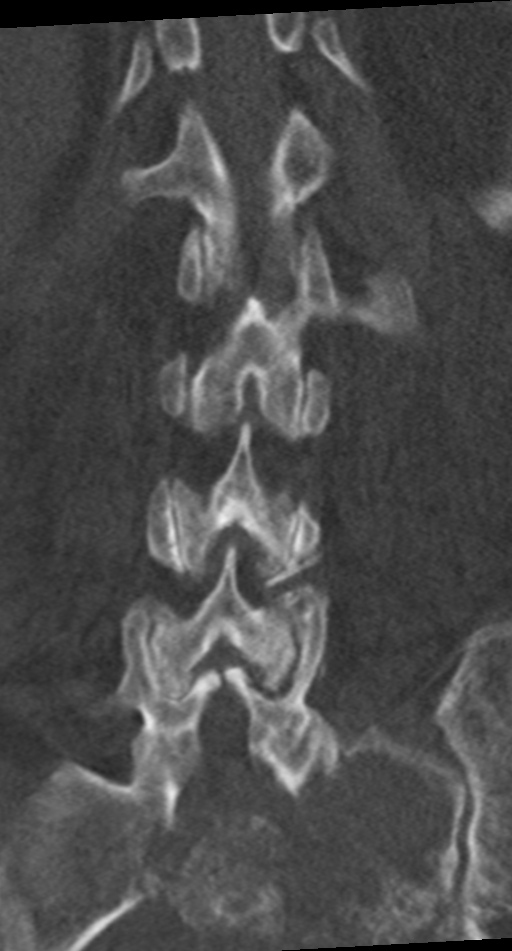
[im 42/70  bone]
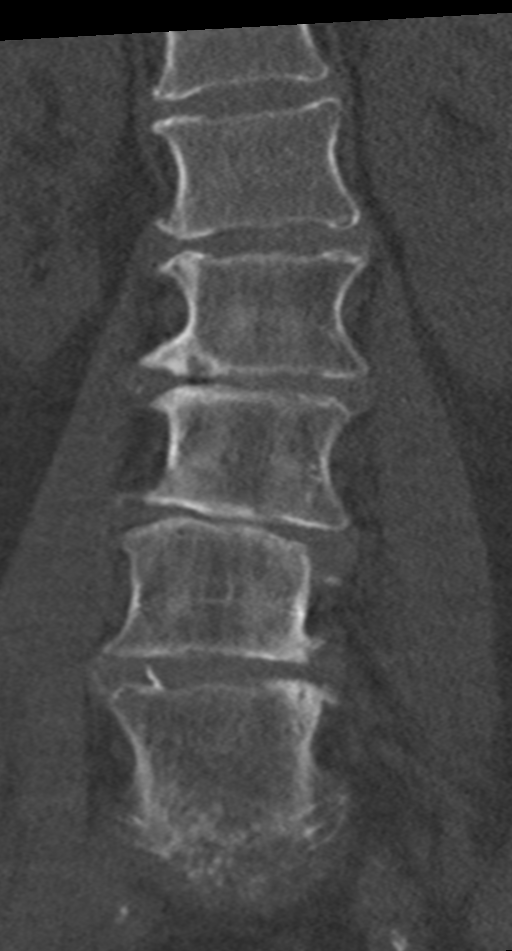

[9 of 33 positions shown; findings below may reference images not displayed]

FINDINGS: Examination is limited due to motion artifact, as patient was unable
to hold still for examination.

Segmentation: 5 lumbar type vertebrae.

Alignment: There is mild anterolisthesis at L4-L5.

Vertebrae: There is a nondisplaced fracture of the transverse
process at L5 on the left. Ill-defined lucencies are present in the
proximal sacrum with suggestion of nondisplaced fractures of the
visualized sacrum bilaterally in zone 3 on the right in zone 2 on
the left, incompletely visualized on this exam.

Paraspinal and other soft tissues: A soft tissue mass is associated
with lucencies present in the sacrum extending into the surrounding
soft tissues.

Disc levels:

T12-L1: There is mild endplate osteophyte formation with no
significant spinal canal or neural foraminal stenosis.

L1-L2: There is a broad-based disc herniation with endplate
osteophyte formation resulting in mild spinal canal stenosis. The
neural foramina are within normal limits.

L2-L3: There is a broad-based disc herniation with endplate
osteophyte formation and facet arthropathy resulting in moderate
spinal canal stenosis. The left neural foramen is within normal
limits. There is mild neural foraminal stenosis on the right.

L3-L4: There is diffuse disc herniation with endplate osteophyte
formation and facet arthropathy resulting in severe spinal canal
stenosis. Moderate neural foraminal stenosis is present bilaterally.

L4-L5: There is a diffuse disc herniation with endplate osteophyte
formation and facet arthropathy resulting in severe spinal canal
stenosis. The spinal canal and thecal sac are compressed and not
well visualized on exam. There is moderate neural foraminal stenosis
on the right and severe neural foraminal stenosis on the left.

L5-S1: There is a disc herniation with endplate osteophyte formation
and facet arthropathy resulting in moderate spinal canal stenosis.
Severe neural foraminal stenosis is present bilaterally.

The spinal canal in the proximal sacrum is not well delineated in
the possibility of soft tissue tumor mass compressing the canal can
not be excluded.
IMPRESSION: 1. Ill-defined lucencies with associated soft tissue mass in the
proximal sacrum, suggesting metastatic disease. There are fractures
of the proximal sacrum bilaterally, likely pathologic. There is also
a fracture of the transverse process at L5 on the left.
2. Multilevel degenerative changes and disc herniations resulting in
severe spinal canal and neural foraminal stenosis at multiple levels
most pronounced at L4-L5. Follow-up with MRI and surgical
consultation is recommended.
3. Continuation of soft tissue attenuation in the sacral spinal
canal concerning for tumor infiltration. Findings may be also better
evaluated with MRI.

## 2021-05-15 IMAGING — CT CT T SPINE W/O CM
2 of 3 series · 12 of 33 positions shown, 15 images · non-contrast
Comparison: [DATE], [DATE].

CLINICAL DATA: Metastatic disease evaluation.

EXAM:
CT THORACIC SPINE WITHOUT CONTRAST
TECHNIQUE: Multidetector CT images of the thoracic were obtained using the
standard protocol without intravenous contrast.
RADIATION DOSE REDUCTION: This exam was performed according to the
departmental dose-optimization program which includes automated
exposure control, adjustment of the mA and/or kV according to
patient size and/or use of iterative reconstruction technique.

[Series 4: t spine st · axial · 0.27mm/px · z∈[-204,+60]mm · 9 of 158 slices shown, 12 images]
[im 13/158  soft-tissue]
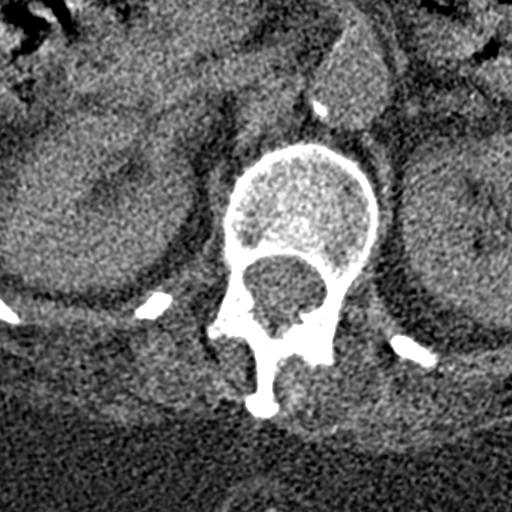
[im 13/158  bone]
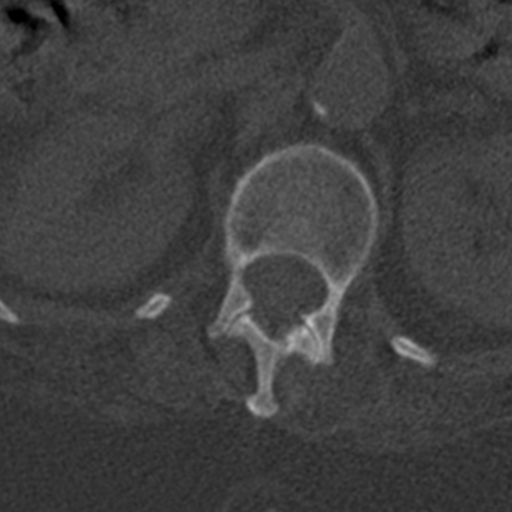
[im 37/158  bone]
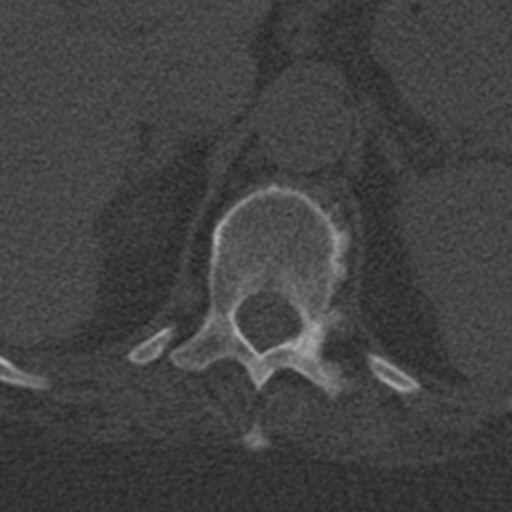
[im 49/158  bone]
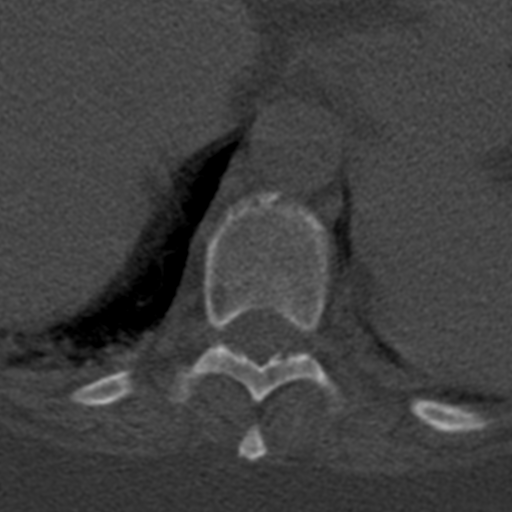
[im 61/158  bone]
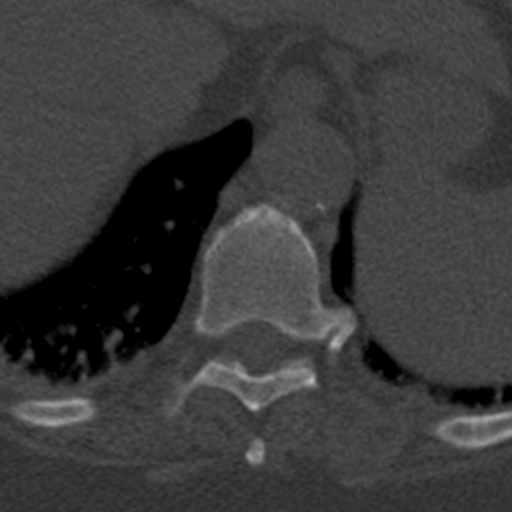
[im 85/158  soft-tissue]
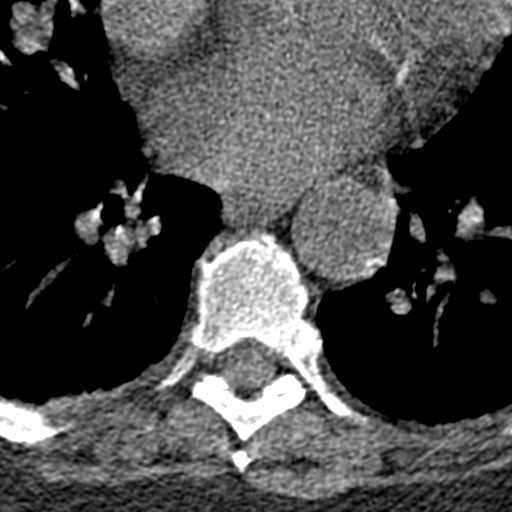
[im 85/158  bone]
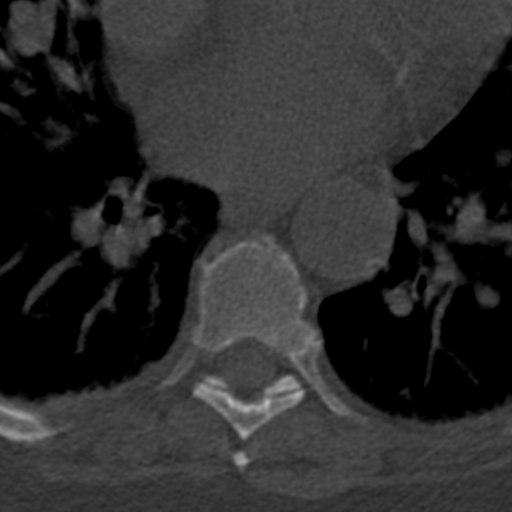
[im 97/158  bone]
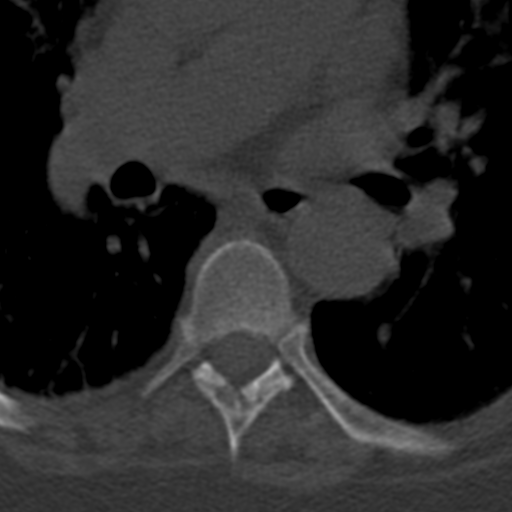
[im 109/158  bone]
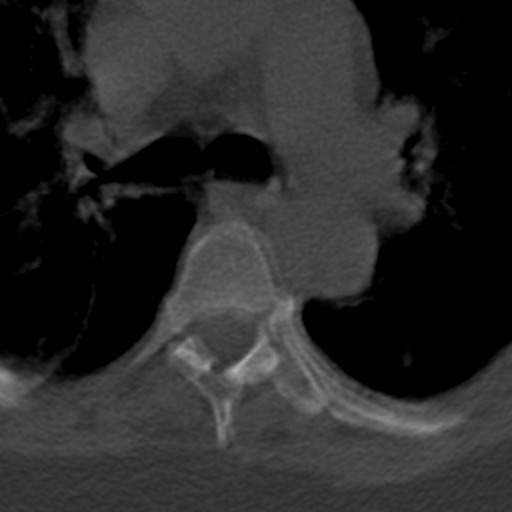
[im 133/158  bone]
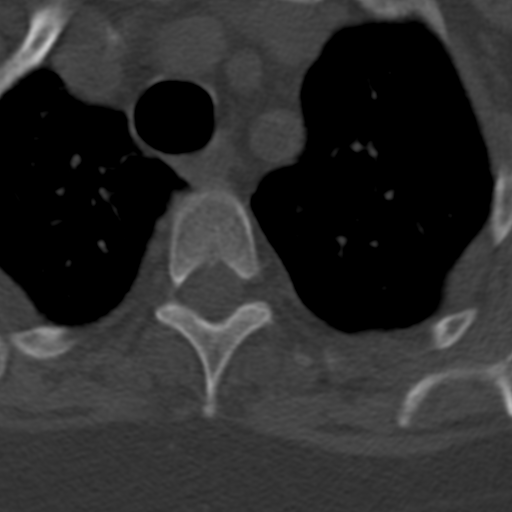
[im 145/158  soft-tissue]
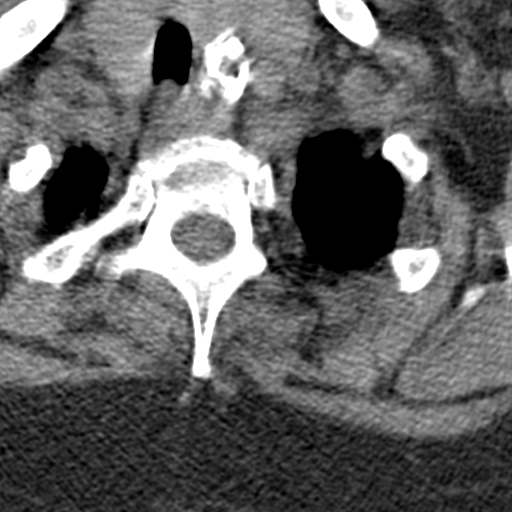
[im 145/158  bone]
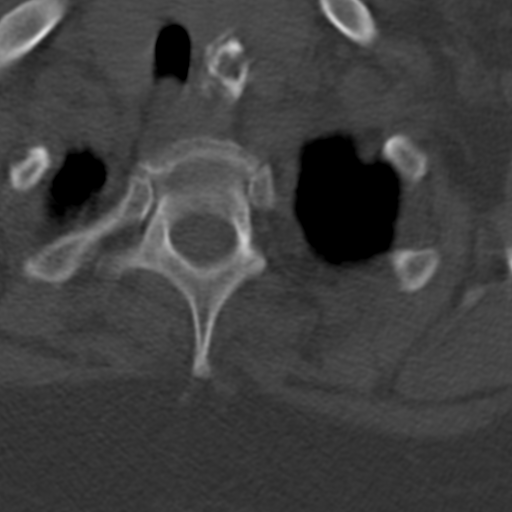

[Series 8: coronal bone · coronal · 0.25mm/px · 3 of 70 slices shown]
[im 14/70  bone]
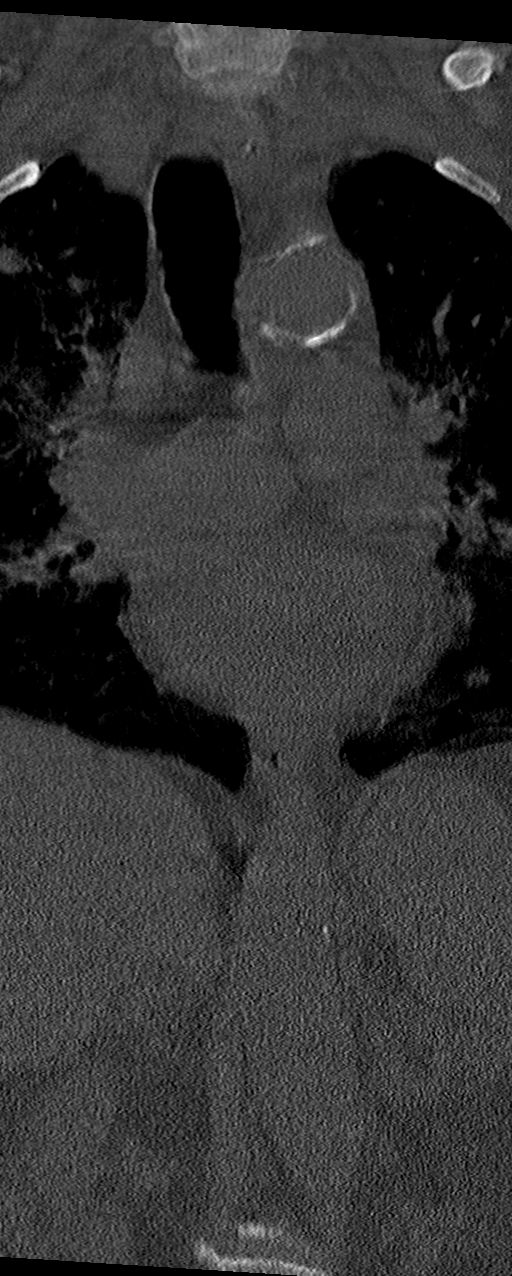
[im 28/70  bone]
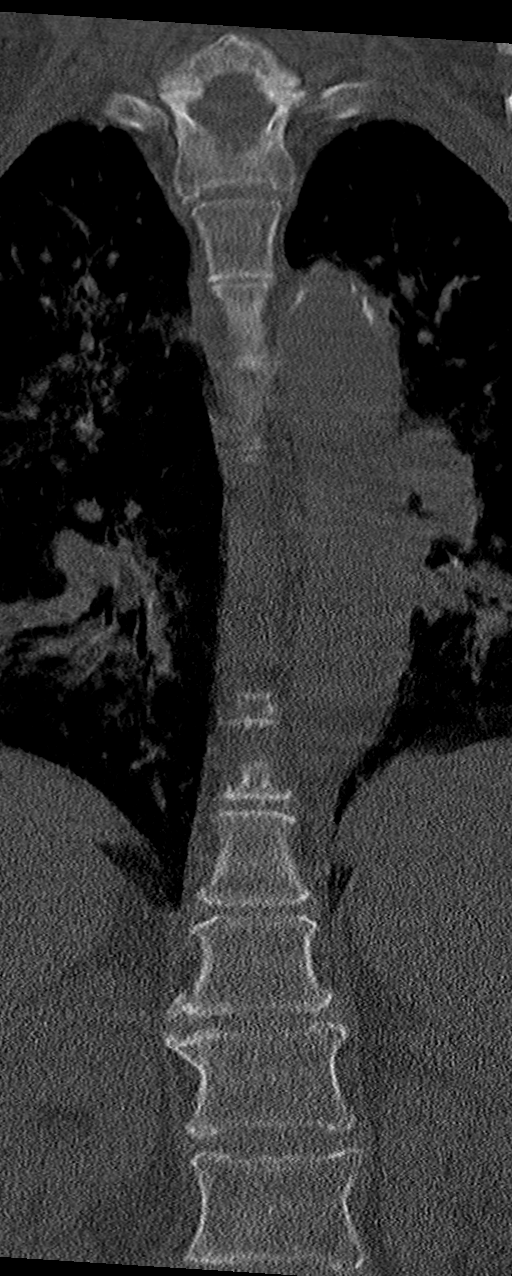
[im 42/70  bone]
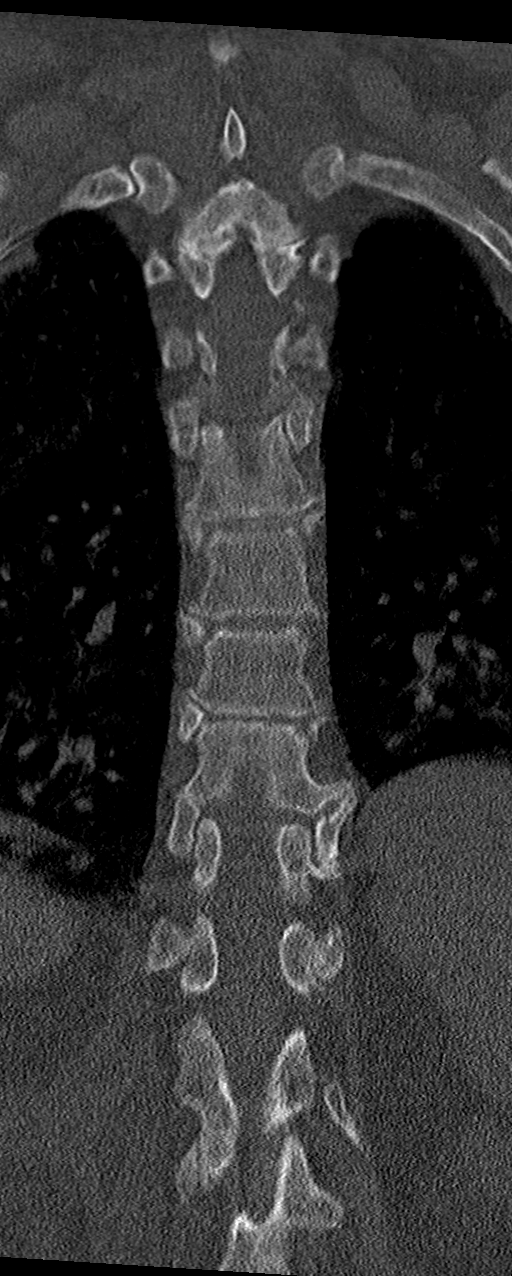

[12 of 33 positions shown; findings below may reference images not displayed]

FINDINGS: Examination is limited due to motion artifact, patient unable to
hold still for exam.

Alignment: Normal.

Vertebrae: No acute fracture or focal pathologic process.

Paraspinal and other soft tissues: The paraspinal soft tissues are
within normal limits. There is atherosclerotic calcification of the
aorta. The thyroid gland is enlarged and heterogeneous, unchanged
from the prior exam. Scattered pulmonary nodules are noted
bilaterally and better evaluated on prior exam.

Disc levels: Mild multilevel intervertebral disc space narrowing and
endplate osteophyte formation is noted. No significant spinal canal
stenosis.
IMPRESSION: 1. No evidence of acute fracture or metastatic disease.
2. Multiple pulmonary nodules bilaterally, better evaluated on prior
CT chest.
3. Aortic atherosclerosis.

## 2021-05-15 NOTE — Progress Notes (Addendum)
PROGRESS NOTE  Tonya Dougherty JAS:[id number] DOB: [birth year] DOA: 05/12/2021 PCP: Gay Filler, MD  Brief History   Tonya Dougherty is a 71 y.o. female with medical history significant of diabetes, obesity, hyperlipidemia, depression, anemia, GERD, history of DVT, hypertension, cirrhosis, colon cancer with bony mets, diverticulosis who presents with worsening mental status.  History obtained with assistance of family chart review due to patient's encephalopathy.  Patient has reportedly had some worsening confusion for the past few days at her facility.  Also with decreased p.o. intake.  Sister confirmed that patient has been confused and continue to be confused today but was able to communicate.  Family has some concerns about patient's metastatic colon cancer possibly having a role.  Patient has had decreased p.o. intake recently.  Was noted to have low blood pressure EMS received a 500 cc bolus. Patient also reportedly recently started on oxycodone for cancer pain.   Patient unable to provide full review of systems given altered mentation.  She does states that she feels "okay "   ED Course: Vital signs in the ED significant for blood pressure initially in the 41P to 37T systolic now improved to the 024-097 35H systolic.  Heart rate in the 80s to 110s, respiratory rate in the teens to 20s.  Lab work-up showed CMP with BUN 83 and creatinine elevated to 3.99 from baseline of 0.8.  Albumin 3.1, AST 47.  CBC with hemoglobin stable 11.  Lactic acid normal on first check with recheck pending.  Respiratory panel for flu and COVID-negative.  Urinalysis with hemoglobin, ketones, protein, leukocytes, few bacteria.  Blood cultures and urine cultures pending.  Chest x-ray showed mild right subsegmental atelectasis likely infiltrate.  CT head showed no acute abnormality but did show significant sinus disease.  Ammonia level pending.  Patient received ceftriaxone azithromycin in the ED as well as 2 L of IV  fluids.  [facility] have been consulted to admit the patient for further evaluation and treatment.  Today the patient is lying in bed. She is quite somnolent. She cries out in pain from her back when an attempt is made to roll her or sit her up to allow my to ausculate her back.  Consultants    Procedures    Antibiotics   Anti-infectives (From admission, onward)    Start     Dose/Rate Route Frequency Ordered Stop   05/14/21 1800  azithromycin (ZITHROMAX) 250 mg in dextrose 5 % 125 mL IVPB        250 mg 127.5 mL/hr over 60 Minutes Intravenous Every 24 hours 05/13/21 1752     05/13/21 2200  cefTRIAXone (ROCEPHIN) 2 g in sodium chloride 0.9 % 100 mL IVPB  Status:  Discontinued        2 g 200 mL/hr over 30 Minutes Intravenous Every 24 hours 05/12/21 2104 05/13/21 1745   05/13/21 2200  azithromycin (ZITHROMAX) 250 mg in dextrose 5 % 125 mL IVPB  Status:  Discontinued        250 mg 127.5 mL/hr over 60 Minutes Intravenous Every 24 hours 05/12/21 2104 05/13/21 1745   05/13/21 1845  azithromycin (ZITHROMAX) 250 mg in dextrose 5 % 125 mL IVPB  Status:  Discontinued        250 mg 127.5 mL/hr over 60 Minutes Intravenous Every 24 hours 05/13/21 1745 05/13/21 1752   05/13/21 1845  cefTRIAXone (ROCEPHIN) 2 g in sodium chloride 0.9 % 100 mL IVPB        2 g 200 mL/hr  over 30 Minutes Intravenous Every 24 hours 05/13/21 1745     05/13/21 1845  azithromycin (ZITHROMAX) 500 mg in sodium chloride 0.9 % 250 mL IVPB        500 mg 250 mL/hr over 60 Minutes Intravenous  Once 05/13/21 1752 05/14/21 0054   05/12/21 1930  cefTRIAXone (ROCEPHIN) 1 g in sodium chloride 0.9 % 100 mL IVPB  Status:  Discontinued        1 g 200 mL/hr over 30 Minutes Intravenous  Once 05/12/21 1917 05/13/21 1745   05/12/21 1930  azithromycin (ZITHROMAX) 500 mg in sodium chloride 0.9 % 250 mL IVPB  Status:  Discontinued        500 mg 250 mL/hr over 60 Minutes Intravenous  Once 05/12/21 1925 05/13/21 1745       Subjective  The patient is resting comfortably. No new complaints.   Objective   Vitals:  Vitals:   05/15/21 0829 05/15/21 1324  BP: 122/75 (!) 144/90  Pulse: (!) 108 83  Resp:  20  Temp:  98.1 F (36.7 C)  SpO2:  92%    Exam:  Constitutional:  The patient is awake, alert, and oriented x 3. No acute distress. Respiratory:  No increased work of breathing. No wheezes, rales, or rhonchi No tactile fremitus Cardiovascular:  Regular rate and rhythm No murmurs, ectopy, or gallups. No lateral PMI. No thrills. Abdomen:  Abdomen is soft, non-tender, non-distended No hernias, masses, or organomegaly Normoactive bowel sounds.  Musculoskeletal:  No cyanosis, clubbing, or edema Skin:  No rashes, lesions, ulcers palpation of skin: no induration or nodules Neurologic:  CN 2-12 intact Sensation all 4 extremities intact Psychiatric:  Mental status Mood, affect appropriate Orientation to person, place, time  judgment and insight appear intact   I have personally reviewed the following:   Today's Data  Vitals  Lab Data  CBC, BMP  Micro Data  Blood culture: 1/2 positive for staph species. Likely contaminant. Will repeat blood cultures  Imaging  CT head CXR  Cardiology Data    Other Data    Scheduled Meds:  apixaban  5 mg Oral BID   carvedilol  3.125 mg Oral BID WC   insulin aspart  0-9 Units Subcutaneous TID WC   lactulose  10 g Oral Daily   nystatin  1 application Topical TID   pantoprazole  40 mg Oral Daily   sodium chloride flush  3 mL Intravenous Q12H   Continuous Infusions:  azithromycin 127.5 mL/hr at 05/15/21 0455   cefTRIAXone (ROCEPHIN)  IV 200 mL/hr at 05/15/21 3532    Principal Problem:   Acute encephalopathy Active Problems:   Adenocarcinoma of sigmoid colon (HCC)   GERD without esophagitis   Mixed diabetic hyperlipidemia associated with type 2 diabetes mellitus ([place])   Type 2 diabetes mellitus with hyperglycemia, with long-term  current use of insulin (HCC)   Pressure injury of skin   Iron deficiency anemia   Cirrhosis ([place])   Debility   MDD (major depressive disorder), recurrent episode, moderate (HCC)   AKI (acute kidney injury) ([place])   Acute metabolic encephalopathy   LOS: 2 days   A & P  Acute encephalopathy: Improved. She is oriented x 2 today, but has no idea why she is here. Likely multifactorial in the setting of decreased p.o. intake and evidence of AKI as below.  Also possible pneumonia versus UTI as below. CT head without acute abnormality.The patient is receiving lactulose for treatment of possible hepatic encephalopathy. Hold central acting  medications including recently started oxycodone as well as Celexa, gabapentin, amantadine.   AKI: Creatinine is improving. 3.99 on admission. 1.34 on 05/13/2021. Monitor creatinine, electrolytes, and volume status.  Avoid nephrotoxic agents and hypotension.  Pneumonia: CXR suggests atelectasis vs pneumonia. No reports of fevers or leukocytosis. Will check procalcitonin.  Continue Rocephin and Azithromycin.   Back Pain: Likely due to bone mets from colon cancer. Patient is complaining bitterly of lumbar and thoracic back pain. She refuses to sit up. Will CT her lumbar and thoracic spine. Will start anti-inflammatories and muscle relaxers. Will avoid narcotic pain medications at this point. Palliative care consulted.   UTI: Urinalysis strongly suggestive of UTI. Urine culture has grown out more than 100K CFU of Proteus mirabilis. Continue rocephin until sensitivities are available.  Cirrhosis: History of cirrhosis and per chart review has had a history of encephalopathy, varices, hydrothorax. Per records reviewed not currently on diuretics.  Labs significant only for mildly elevated AST at 47.  Albumin 3.1.  ALT alk phos and bilirubin normal.  Coagulation studies not checked in ED. Start lactulose considering altered mental status Monitor LFT's.  Pressure injury to  skin: Stage 1 pressure injury to bilateral buttocks. Also MASD to abdomen and bilateral lower breasts.   Diabetes > Insulin 16 units nightly and SSI at facility - Continue with insulin at 5 units nightly - SSI  -glucoses 87-140 in the last 24 hours.   Hypertension > Intermittent hypotension in the ED. Resolved.  - Continue home carvedilol.  - Normotensive   Colon cancer with bony mets - Does not appear to be currently on any systemic therapy - Oxycodone has been restarted to cover the patient's cancer related pain.   GERD - Continue PPI  Depression - Hold home Celexa in setting of abdominal status as above   Anemia > History of iron deficiency anemia with hemoglobin stable at 11. - Trend CBC  Debility: Patient is refusing to get out of bed. Will consult palliative care.  I have seen and examined this patient myself. I have spent 34 minutes in her evaluation and care.   DVT prophylaxis:      Eliquis Code Status:              Full Family Communication:       None available Disposition Plan:              Patient is from:                        [place] to:                   Same as above             Anticipated DC date:               1 to 5 days             Anticipated DC barriers:         None     Jacari Kirsten, DO [facility] Direct contact: see [email or web address]  7PM-7AM contact night coverage as above 05/15/2021, 5:21 PM  LOS: 0 days

## 2021-05-15 NOTE — Consult Note (Signed)
WOC Nurse Consult Note: Patient receiving care in [place] Reason for Consult: MASD to body folds--abdomen and breasts Wound type: L30.4  - Erythema intertrigo. Also used for abrasion of the hand, chafing of the skin, dermatitis due to sweating and friction, friction dermatitis, friction eczema, and genital/thigh intertrigo. 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 Drainage (amount, consistency, odor)  Periwound: Dressing procedure/placement/frequency: Continue the previously ordered antifungal powder.  [place] nurse will not follow at this time.  Please re-consult the [place] team if needed.  Val Riles, RN, MSN, CWOCN, CNS-BC, pager [phone]

## 2021-05-15 NOTE — Progress Notes (Signed)
Patient restless, always C/O back pain, PRN oxy, given. Will continue to assess patient.  Patient unable to ambulate to check oxygen saturation.

## 2021-05-16 LAB — URINE CULTURE: Culture: >=100000 — AB

## 2021-05-16 LAB — GLUCOSE, CAPILLARY
Glucose-Capillary: 132 mg/dL — ABNORMAL HIGH (ref 70–99)
Glucose-Capillary: 100 mg/dL — ABNORMAL HIGH (ref 70–99)
Glucose-Capillary: 120 mg/dL — ABNORMAL HIGH (ref 70–99)
Glucose-Capillary: 124 mg/dL — ABNORMAL HIGH (ref 70–99)

## 2021-05-16 LAB — PROCALCITONIN: Procalcitonin: <0.1 ng/mL

## 2021-05-16 MED ORDER — DEXAMETHASONE SODIUM PHOSPHATE 10 MG/ML IJ SOLN
8.0000 mg | INTRAMUSCULAR | Status: DC
  Administered 2021-05-16 – 2021-05-17 (×2): 8 mg via INTRAVENOUS
  Filled 2021-05-16 (×2): qty 1

## 2021-05-16 MED ORDER — HALOPERIDOL LACTATE 5 MG/ML IJ SOLN
0.5000 mg | INTRAMUSCULAR | Status: DC | PRN
  Administered 2021-05-17 – 2021-05-18 (×3): 0.5 mg via INTRAVENOUS
  Filled 2021-05-16 (×3): qty 1

## 2021-05-16 MED ORDER — GLYCOPYRROLATE 1 MG PO TABS: 1.0000 mg | ORAL_TABLET | ORAL | Status: DC | PRN

## 2021-05-16 MED ORDER — CEFAZOLIN SODIUM-DEXTROSE 1-4 GM/50ML-% IV SOLN
1.0000 g | Freq: Three times a day (TID) | INTRAVENOUS | Status: DC
  Administered 2021-05-16 – 2021-05-17 (×3): 1 g via INTRAVENOUS
  Filled 2021-05-16 (×5): qty 50

## 2021-05-16 MED ORDER — ONDANSETRON 4 MG PO TBDP: 4.0000 mg | ORAL_TABLET | Freq: Four times a day (QID) | ORAL | Status: DC | PRN

## 2021-05-16 MED ORDER — LORAZEPAM 2 MG/ML IJ SOLN
0.5000 mg | INTRAMUSCULAR | Status: DC | PRN
  Administered 2021-05-17 – 2021-05-18 (×4): 0.5 mg via INTRAVENOUS
  Filled 2021-05-16 (×4): qty 1

## 2021-05-16 MED ORDER — HYDROMORPHONE HCL 1 MG/ML IJ SOLN
0.5000 mg | INTRAMUSCULAR | Status: DC | PRN
  Administered 2021-05-16: 1 mg via INTRAVENOUS
  Filled 2021-05-16: qty 1

## 2021-05-16 MED ORDER — GLYCOPYRROLATE 0.2 MG/ML IJ SOLN: 0.2000 mg | INTRAMUSCULAR | Status: DC | PRN

## 2021-05-16 MED ORDER — LORAZEPAM 2 MG/ML IJ SOLN
1.0000 mg | Freq: Once | INTRAMUSCULAR | Status: AC
  Administered 2021-05-16: 1 mg via INTRAVENOUS
  Filled 2021-05-16: qty 1

## 2021-05-16 MED ORDER — HALOPERIDOL LACTATE 2 MG/ML PO CONC: 0.5000 mg | ORAL | Status: DC | PRN

## 2021-05-16 MED ORDER — BIOTENE DRY MOUTH MT LIQD: 15.0000 mL | OROMUCOSAL | Status: DC | PRN

## 2021-05-16 MED ORDER — ONDANSETRON HCL 4 MG/2ML IJ SOLN: 4.0000 mg | Freq: Four times a day (QID) | INTRAMUSCULAR | Status: DC | PRN

## 2021-05-16 MED ORDER — FENTANYL 25 MCG/HR TD PT72
1.0000 | MEDICATED_PATCH | TRANSDERMAL | Status: DC
  Administered 2021-05-16: 1 via TRANSDERMAL
  Filled 2021-05-16: qty 1

## 2021-05-16 MED ORDER — HYDROMORPHONE HCL 1 MG/ML IJ SOLN
1.0000 mg | INTRAMUSCULAR | Status: DC | PRN
  Administered 2021-05-16 – 2021-05-18 (×5): 1 mg via INTRAVENOUS
  Filled 2021-05-16 (×6): qty 1

## 2021-05-16 MED ORDER — HALOPERIDOL 0.5 MG PO TABS: 0.5000 mg | ORAL_TABLET | ORAL | Status: DC | PRN

## 2021-05-16 NOTE — Plan of Care (Signed)

## 2021-05-16 NOTE — Progress Notes (Signed)
PROGRESS NOTE  Tonya Dougherty GGY:[id number] DOB: Apr 30, 1951 DOA: 05/12/2021 PCP: Gay Filler, MD  Brief History   Tonya Dougherty is a 71 y.o. female with medical history significant of diabetes, obesity, hyperlipidemia, depression, anemia, GERD, history of DVT, hypertension, cirrhosis, colon cancer with bony mets, diverticulosis who presents with worsening mental status.  History obtained with assistance of family chart review due to patient's encephalopathy.  Patient has reportedly had some worsening confusion for the past few days at her facility.  Also with decreased p.o. intake.  Sister confirmed that patient has been confused and continue to be confused today but was able to communicate.  Family has some concerns about patient's metastatic colon cancer possibly having a role.  Patient has had decreased p.o. intake recently.  Was noted to have low blood pressure EMS received a 500 cc bolus. Patient also reportedly recently started on oxycodone for cancer pain.   Patient unable to provide full review of systems given altered mentation.  She does states that she feels "okay "   ED Course: Vital signs in the ED significant for blood pressure initially in the 03J to 00X systolic now improved to the 381-829 93Z systolic.  Heart rate in the 80s to 110s, respiratory rate in the teens to 20s.  Lab work-up showed CMP with BUN 83 and creatinine elevated to 3.99 from baseline of 0.8.  Albumin 3.1, AST 47.  CBC with hemoglobin stable 11.  Lactic acid normal on first check with recheck pending.  Respiratory panel for flu and COVID-negative.  Urinalysis with hemoglobin, ketones, protein, leukocytes, few bacteria.  Blood cultures and urine cultures pending.  Chest x-ray showed mild right subsegmental atelectasis likely infiltrate.  CT head showed no acute abnormality but did show significant sinus disease.  Ammonia level pending.  Patient received ceftriaxone azithromycin in the ED as well as 2 L of IV  fluids.  [facility] have been consulted to admit the patient for further evaluation and treatment.  Today the patient is still complaining of severe back pain. She is refusing PO meds. Not just her pain meds, but her metoprolol as well. I have ordered a Fentanyl patch and IV dilaudid. Palliative care consult is pending as is MRI of L spine. Consultants  Pallliative care  Procedures    Antibiotics   Anti-infectives (From admission, onward)    Start     Dose/Rate Route Frequency Ordered Stop   05/16/21 1600  ceFAZolin (ANCEF) IVPB 1 g/50 mL premix        1 g 100 mL/hr over 30 Minutes Intravenous Every 8 hours 05/16/21 1255     05/14/21 1800  azithromycin (ZITHROMAX) 250 mg in dextrose 5 % 125 mL IVPB        250 mg 127.5 mL/hr over 60 Minutes Intravenous Every 24 hours 05/13/21 1752     05/13/21 2200  cefTRIAXone (ROCEPHIN) 2 g in sodium chloride 0.9 % 100 mL IVPB  Status:  Discontinued        2 g 200 mL/hr over 30 Minutes Intravenous Every 24 hours 05/12/21 2104 05/13/21 1745   05/13/21 2200  azithromycin (ZITHROMAX) 250 mg in dextrose 5 % 125 mL IVPB  Status:  Discontinued        250 mg 127.5 mL/hr over 60 Minutes Intravenous Every 24 hours 05/12/21 2104 05/13/21 1745   05/13/21 1845  azithromycin (ZITHROMAX) 250 mg in dextrose 5 % 125 mL IVPB  Status:  Discontinued        250 mg  127.5 mL/hr over 60 Minutes Intravenous Every 24 hours 05/13/21 1745 05/13/21 1752   05/13/21 1845  cefTRIAXone (ROCEPHIN) 2 g in sodium chloride 0.9 % 100 mL IVPB  Status:  Discontinued        2 g 200 mL/hr over 30 Minutes Intravenous Every 24 hours 05/13/21 1745 05/16/21 1255   05/13/21 1845  azithromycin (ZITHROMAX) 500 mg in sodium chloride 0.9 % 250 mL IVPB        500 mg 250 mL/hr over 60 Minutes Intravenous  Once 05/13/21 1752 05/14/21 0054   05/12/21 1930  cefTRIAXone (ROCEPHIN) 1 g in sodium chloride 0.9 % 100 mL IVPB  Status:  Discontinued        1 g 200 mL/hr over 30 Minutes  Intravenous  Once 05/12/21 1917 05/13/21 1745   05/12/21 1930  azithromycin (ZITHROMAX) 500 mg in sodium chloride 0.9 % 250 mL IVPB  Status:  Discontinued        500 mg 250 mL/hr over 60 Minutes Intravenous  Once 05/12/21 1925 05/13/21 1745      Subjective  The patient is resting fitfully due to back pain. She is refusing oral meds.  Objective   Vitals:  Vitals:   05/16/21 0606 05/16/21 1412  BP: 123/77 (!) 141/76  Pulse: 81 83  Resp: 18 20  Temp: (!) 97.5 F (36.4 C) 97.8 F (36.6 C)  SpO2: 95% 92%    Exam:  Constitutional:  The patient is somnolent, but rousable. Moderate distress from back pain. Cannot get comfortably. Respiratory:  No increased work of breathing. No wheezes, rales, or rhonchi No tactile fremitus Cardiovascular:  Regular rate and rhythm No murmurs, ectopy, or gallups. No lateral PMI. No thrills. Abdomen:  Abdomen is soft, non-tender, non-distended No hernias, masses, or organomegaly Normoactive bowel sounds.  Musculoskeletal:  No cyanosis, clubbing, or edema Skin:  No rashes, lesions, ulcers palpation of skin: no induration or nodules Neurologic:  CN 2-12 intact Sensation all 4 extremities intact Psychiatric:  Mental status Mood, affect appropriate Orientation to person, place, time  judgment and insight appear intact   I have personally reviewed the following:   Today's Data  Vitals  Lab Data  CBC, BMP  Micro Data  Blood culture: 1/2 positive for staph species. Likely contaminant. Will repeat blood cultures  Imaging  CT head CXR  Cardiology Data    Other Data    Scheduled Meds:  apixaban  5 mg Oral BID   carvedilol  3.125 mg Oral BID WC   dexamethasone (DECADRON) injection  8 mg Intravenous Q24H   fentaNYL  1 patch Transdermal Q72H   insulin aspart  0-9 Units Subcutaneous TID WC   lactulose  10 g Oral Daily   LORazepam  1 mg Intravenous Once   nystatin  1 application Topical TID   pantoprazole  40 mg Oral Daily    sodium chloride flush  3 mL Intravenous Q12H   Continuous Infusions:  azithromycin 250 mg (05/16/21 1732)    ceFAZolin (ANCEF) IV 1 g (05/16/21 1636)    Principal Problem:   Acute encephalopathy Active Problems:   Adenocarcinoma of sigmoid colon (HCC)   GERD without esophagitis   Mixed diabetic hyperlipidemia associated with type 2 diabetes mellitus ([place])   Type 2 diabetes mellitus with hyperglycemia, with long-term current use of insulin (HCC)   Pressure injury of skin   Iron deficiency anemia   Cirrhosis ([place])   Debility   MDD (major depressive disorder), recurrent episode, moderate ([place])  AKI (acute kidney injury) ([place])   Acute metabolic encephalopathy   LOS: 3 days   A & P  Acute encephalopathy: Improved. She is oriented x 2 today, but has no idea why she is here. Likely multifactorial in the setting of decreased p.o. intake and evidence of AKI as below.  Also possible pneumonia versus UTI as below. CT head without acute abnormality.The patient is receiving lactulose for treatment of possible hepatic encephalopathy. Hold central acting medications including recently started oxycodone as well as Celexa, gabapentin, amantadine.   AKI: Creatinine is improving. 3.99 on admission. 1.34 on 05/13/2021. Monitor creatinine, electrolytes, and volume status.  Avoid nephrotoxic agents and hypotension.  Pneumonia: CXR suggests atelectasis vs pneumonia. No reports of fevers or leukocytosis. Will check procalcitonin.  Continue Rocephin and Azithromycin.   Back Pain: Severe. CT L spine indicates known presacral tissue mass and boney infiltration of the vertebral bodies and spinal canal MRI is pending. Likely due to bone mets from colon cancer. Patient is complaining bitterly of lumbar and thoracic back pain. She refuses to sit up. Have started anti-inflammatories and muscle relaxers as well as Fentanyl patch and IV dilaudid. Palliative care consult is pending.   UTI: Urinalysis strongly  suggestive of UTI. Urine culture has grown out more than 100K CFU of Proteus mirabilis. Continue rocephin until sensitivities are available.  Cirrhosis: History of cirrhosis and per chart review has had a history of encephalopathy, varices, hydrothorax. Per records reviewed not currently on diuretics.  Labs significant only for mildly elevated AST at 47.  Albumin 3.1.  ALT alk phos and bilirubin normal.  Coagulation studies not checked in ED. Start lactulose considering altered mental status monitor LFT's.  Pressure injury to skin: Stage 1 pressure injury to bilateral buttocks. Also MASD to abdomen and bilateral lower breasts.   Diabetes > Insulin 16 units nightly and SSI at facility - Continue with insulin at 5 units nightly - SSI  -glucoses 87-140 in the last 24 hours.   Hypertension > Intermittent hypotension in the ED. Resolved.  - Continue home carvedilol.  - Normotensive   Colon cancer with bony mets - Does not appear to be currently on any systemic therapy - Oxycodone has been restarted to cover the patient's cancer related pain.   GERD - Continue PPI  Depression - Hold home Celexa in setting of abdominal status as above   Anemia > History of iron deficiency anemia with hemoglobin stable at 11. - Trend CBC  Debility: Patient is refusing to get out of bed. Will consult palliative care.  I have seen and examined this patient myself. I have spent 38 minutes in her evaluation and care.   DVT prophylaxis:      Eliquis Code Status:              Full Family Communication:       None available Disposition Plan:              Patient is from:                        [place] to:                   Same as above             Anticipated DC date:  1 to 5 days             Anticipated DC barriers:         None     Airen Dales, DO [facility] Direct contact: see [email or web address]  7PM-7AM contact night coverage as  above 05/16/2021, 5:49 PM  LOS: 0 days

## 2021-05-16 NOTE — Consult Note (Signed)
Palliative Care Consultation  71 yo with metastatic colon cancer and liver disease. Admitted with intractable pain and AMS.  Spoke with her HCPOA, sister [name], about the severity of her condition and goals of care.  Decision has been made to provide comfort care and focus on relief of suffering at end of life. She is severely encephalopathic, not eating and requiring sedation and frequent pain prns.  Referral to [facility] for complex pain and symptom management. Prognosis-<2 weeks most likely.   Lane Hacker, DO Palliative Medicine  Time: 45 minutes Greater than 50%  of this time was spent counseling and coordinating care related to the above assessment and plan.

## 2021-05-16 NOTE — Progress Notes (Signed)
Patient was cleaned up and looks like she is having red tinged urine. On appearance, no scratch marks were found around the area. She does scratch and was marked comfort care today. MD informed.

## 2021-05-17 ENCOUNTER — Ambulatory Visit (HOSPITAL_BASED_OUTPATIENT_CLINIC_OR_DEPARTMENT_OTHER): Admission: RE | Admit: 2021-05-17 | Payer: Medicare HMO | Source: Ambulatory Visit

## 2021-05-17 LAB — CULTURE, BLOOD (ROUTINE X 2): Culture: NO GROWTH

## 2021-05-17 LAB — GLUCOSE, CAPILLARY: Glucose-Capillary: 117 mg/dL — ABNORMAL HIGH (ref 70–99)

## 2021-05-17 MED ORDER — HYDROXYZINE HCL 10 MG PO TABS
10.0000 mg | ORAL_TABLET | Freq: Three times a day (TID) | ORAL | Status: DC
  Administered 2021-05-18: 10 mg via ORAL
  Filled 2021-05-17 (×4): qty 1

## 2021-05-17 MED ORDER — DIPHENHYDRAMINE HCL 50 MG/ML IJ SOLN
25.0000 mg | Freq: Four times a day (QID) | INTRAMUSCULAR | Status: DC | PRN
  Administered 2021-05-17 – 2021-05-18 (×4): 25 mg via INTRAVENOUS
  Filled 2021-05-17 (×5): qty 1

## 2021-05-17 NOTE — Progress Notes (Signed)
Manufacturing engineer [facility]) Hospital Liaison note.     This patient is approved to transfer to [facility] 05/18/21.  [facility] will notify TOC when registration paperwork has been completed to arrange transport.    RN please call report to [phone].   Thank you,     Farrel Gordon, RN, [facility] Liaison  [phone]

## 2021-05-17 NOTE — Progress Notes (Signed)
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 Marcello Moores Prior extensive LLE DVT 10/24/2017  Admit 05/12/2021 acute encephalopathy in the setting of creatinine 27/0.7 baseline-->83/3.99 Also on admission felt to have a possible chest infiltrate versus UTI and started on antibiotics  Patient was eventually transitioned to hospice care during hospital stay during hospital stay and referral was made to [facility]  Hospital-Problem based course  Metastatic colon cancer with liver disease Comfort care at this time Intractable pain altered mental status likely secondary to above Continue fentanyl patch, continue Dilaudid every 2 as needed Continue Decadron 8 mg q. 24 ?  Pneumonia--Proteus [name] UTI Trajectory shifted to full comfort-discontinue Rocephin and other antibiotics Severe itching likely secondary to either antibiotics underlying cirrhosis and/or opiates If able to take p.o. continue p.o. hydroxyzine 10 mg 3 times daily-if unable will place order for IV  DVT prophylaxis: n Code Status: DNR Family Communication: None present.today  Disposition:  Status is: Inpatient  Remains inpatient appropriate because: Await freestanding hospice       Consultants:  Palliative care  Procedures: None  Antimicrobials: No   Subjective: Patient is sleeping heavily at bedside I noticed her urinary catheter is black and filled with what looks like gross blood I did not arouse her to discuss with her  Objective: Vitals:   05/16/21 0606 05/16/21 1412 05/16/21 2139 05/17/21 1340  BP: 123/77 (!) 141/76 (!) 186/95 (!) 151/139  Pulse: 81 83 92 87  Resp: 18 20 20    Temp: (!) 97.5 F (36.4 C) 97.8 F (36.6 C) 98.8 F (37.1 C)    TempSrc: Oral Oral Oral   SpO2: 95% 92% 96% (!) 79%  Weight:      Height:        Intake/Output Summary (Last 24 hours) at 05/17/2021 1422 Last data filed at 05/16/2021 2333 Gross per 24 hour  Intake 224.82 ml  Output --  Net 224.82 ml   Filed Weights   05/13/21 1754 05/14/21 0618 05/16/21 0500  Weight: 88.6 kg 91.6 kg 88.4 kg    Examination:  Obese white female no distress snoring heavily Chest clear no rales rhonchi Fentanyl patch on upper right side Abdomen soft  Data Reviewed: personally reviewed   CBC    Component Value Date/Time   WBC 4.7 05/15/2021 0428   RBC 3.71 (L) 05/15/2021 0428   HGB 10.8 (L) 05/15/2021 0428   HGB 9.7 (L) 04/28/2021 0856   HGB 5.9 (L) 03/27/2013 0836   HCT 36.5 05/15/2021 0428   HCT 18.9 (L) 03/27/2013 0836   PLT 191 05/15/2021 0428   PLT 124 (L) 04/28/2021 0856   PLT 258 03/27/2013 0836   MCV 98.4 05/15/2021 0428   MCV 86 03/27/2013 0836   MCH 29.1 05/15/2021 0428   MCHC 29.6 (L) 05/15/2021 0428   RDW 16.8 (H) 05/15/2021 0428   RDW 17.8 (H) 03/27/2013 0836   LYMPHSABS 1.2 05/15/2021 0428   LYMPHSABS 1.8 03/27/2013 0836   MONOABS 0.7 05/15/2021 0428   MONOABS 1.3 (H) 03/27/2013 0836   EOSABS 0.1 05/15/2021 0428   EOSABS 0.1 03/27/2013 0836   BASOSABS 0.0 05/15/2021 0428   BASOSABS 0.1 03/27/2013 0836   CMP Latest Ref Rng & Units 05/15/2021 05/13/2021 05/12/2021  Glucose 70 -  99 mg/dL 106(H) 85 100(H)  BUN 8 - 23 mg/dL 31(H) 63(H) 83(H)  Creatinine 0.44 - 1.00 mg/dL 0.78 1.34(H) 3.99(H)  Sodium 135 - 145 mmol/L 143 140 137  Potassium 3.5 - 5.1 mmol/L 3.7 4.8 4.1  Chloride 98 - 111 mmol/L 109 111 104  CO2 22 - 32 mmol/L 26 22 21(L)  Calcium 8.9 - 10.3 mg/dL 9.6 8.9 8.9  Total Protein 6.5 - 8.1 g/dL - 6.8 6.8  Total Bilirubin 0.3 - 1.2 mg/dL - 1.5(H) 1.1  Alkaline Phos 38 - 126 U/L - 118 120  AST 15 - 41 U/L - 65(H) 47(H)  ALT 0 - 44 U/L - 30 27     Radiology Studies: CT THORACIC SPINE WO CONTRAST  Result Date:  05/15/2021 CLINICAL DATA:  Metastatic disease evaluation. EXAM: CT THORACIC SPINE WITHOUT CONTRAST TECHNIQUE: Multidetector CT images of the thoracic were obtained using the standard protocol without intravenous contrast. RADIATION DOSE REDUCTION: This exam was performed according to the departmental dose-optimization program which includes automated exposure control, adjustment of the mA and/or kV according to patient size and/or use of iterative reconstruction technique. COMPARISON:  04/28/2021, 04/24/2020. FINDINGS: Examination is limited due to motion artifact, patient unable to hold still for exam. Alignment: Normal. Vertebrae: No acute fracture or focal pathologic process. Paraspinal and other soft tissues: The paraspinal soft tissues are within normal limits. There is atherosclerotic calcification of the aorta. The thyroid gland is enlarged and heterogeneous, unchanged from the prior exam. Scattered pulmonary nodules are noted bilaterally and better evaluated on prior exam. Disc levels: Mild multilevel intervertebral disc space narrowing and endplate osteophyte formation is noted. No significant spinal canal stenosis. IMPRESSION: 1. No evidence of acute fracture or metastatic disease. 2. Multiple pulmonary nodules bilaterally, better evaluated on prior CT chest. 3. Aortic atherosclerosis. Electronically Signed   By: Brett Fairy M.D.   On: 05/15/2021 20:40   CT LUMBAR SPINE WO CONTRAST  Result Date: 05/15/2021 CLINICAL DATA:  Metastatic disease evaluation. EXAM: CT LUMBAR SPINE WITHOUT CONTRAST TECHNIQUE: Multidetector CT imaging of the lumbar spine was performed without intravenous contrast administration. Multiplanar CT image reconstructions were also generated. RADIATION DOSE REDUCTION: This exam was performed according to the departmental dose-optimization program which includes automated exposure control, adjustment of the mA and/or kV according to patient size and/or use of iterative reconstruction  technique. COMPARISON:  03/25/2020, December 27, 2020. FINDINGS: Examination is limited due to motion artifact, as patient was unable to hold still for examination. Segmentation: 5 lumbar type vertebrae. Alignment: There is mild anterolisthesis at L4-L5. Vertebrae: There is a nondisplaced fracture of the transverse process at L5 on the left. Ill-defined lucencies are present in the proximal sacrum with suggestion of nondisplaced fractures of the visualized sacrum bilaterally in zone 3 on the right in zone 2 on the left, incompletely visualized on this exam. Paraspinal and other soft tissues: A soft tissue mass is associated with lucencies present in the sacrum extending into the surrounding soft tissues. Disc levels: T12-L1: There is mild endplate osteophyte formation with no significant spinal canal or neural foraminal stenosis. L1-L2: There is a broad-based disc herniation with endplate osteophyte formation resulting in mild spinal canal stenosis. The neural foramina are within normal limits. L2-L3: There is a broad-based disc herniation with endplate osteophyte formation and facet arthropathy resulting in moderate spinal canal stenosis. The left neural foramen is within normal limits. There is mild neural foraminal stenosis on the right. L3-L4: There is diffuse disc herniation with endplate osteophyte formation  and facet arthropathy resulting in severe spinal canal stenosis. Moderate neural foraminal stenosis is present bilaterally. L4-L5: There is a diffuse disc herniation with endplate osteophyte formation and facet arthropathy resulting in severe spinal canal stenosis. The spinal canal and thecal sac are compressed and not well visualized on exam. There is moderate neural foraminal stenosis on the right and severe neural foraminal stenosis on the left. L5-S1: There is a disc herniation with endplate osteophyte formation and facet arthropathy resulting in moderate spinal canal stenosis. Severe neural foraminal  stenosis is present bilaterally. The spinal canal in the proximal sacrum is not well delineated in the possibility of soft tissue tumor mass compressing the canal can not be excluded. IMPRESSION: 1. Ill-defined lucencies with associated soft tissue mass in the proximal sacrum, suggesting metastatic disease. There are fractures of the proximal sacrum bilaterally, likely pathologic. There is also a fracture of the transverse process at L5 on the left. 2. Multilevel degenerative changes and disc herniations resulting in severe spinal canal and neural foraminal stenosis at multiple levels most pronounced at L4-L5. Follow-up with MRI and surgical consultation is recommended. 3. Continuation of soft tissue attenuation in the sacral spinal canal concerning for tumor infiltration. Findings may be also better evaluated with MRI. Electronically Signed   By: Brett Fairy M.D.   On: 05/15/2021 21:28     Scheduled Meds:  dexamethasone (DECADRON) injection  8 mg Intravenous Q24H   fentaNYL  1 patch Transdermal Q72H   hydrOXYzine  10 mg Oral TID   nystatin  1 application Topical TID   sodium chloride flush  3 mL Intravenous Q12H   Continuous Infusions:   LOS: 4 days   Time spent: [place], MD [facility] To contact the attending provider between 7A-7P or the covering provider during after hours 7P-7A, please log into the web site [email or web address] and access using universal [place] password for that web site. If you do not have the password, please call the hospital operator.  05/17/2021, 2:22 PM

## 2021-05-17 NOTE — TOC Transition Note (Signed)
Transition of Care [facility]) - CM/SW Discharge Note   Patient Details  Name: Tonya Dougherty MRN: [id number] Date of Birth: 11-Apr-1951  [facility]) CM/SW Contact:  Dessa Phi, RN Phone Number: 05/17/2021, 12:04 PM   Clinical Narrative: Spoke to sister Wanda-agreed to residential hospice-chose [facility] will eval-await outcome.      Final next level of care: [place] Barriers to Discharge: Continued Medical Work up   Patient Goals and CMS Choice Patient states their goals for this hospitalization and ongoing recovery are:: return back to [place] Medicare.gov Compare Post Acute Care list provided to:: Patient Represenative (must comment) [name] sister [phone]) Choice offered to / list presented to : Sibling  Discharge Placement                       Discharge Plan and Services   Discharge Planning Services: CM Consult                                 Social Determinants of Health (SDOH) Interventions     Readmission Risk Interventions Readmission Risk Prevention Plan 03/30/2020  Transportation Screening Complete  PCP or Specialist Appt within 5-7 Days Complete  Home Care Screening Complete  Medication Review (RN CM) Complete  Some recent data might be hidden

## 2021-05-17 NOTE — Progress Notes (Signed)
[facility] ([facility]) Hospital Liaison note.      Received request from TOC manager for family interest in [facility]. Patient information has been forwarded to [facility] for review.  Eligibility confirmed.     [facility] Hospital Liaison will follow up tomorrow or sooner if a room becomes available.   A Please do not hesitate to call with questions.    Thank you,    Mary Anne Robertson, RN, CCM       [facility] Hospital Liaison (listed on AMION under Hospice /[facility])     [phone] 

## 2021-05-18 LAB — RESP PANEL BY RT-PCR (FLU A&B, COVID) ARPGX2
Influenza A by PCR: NEGATIVE
Influenza B by PCR: NEGATIVE
SARS Coronavirus 2 by RT PCR: NEGATIVE

## 2021-05-18 MED ORDER — HYDROMORPHONE HCL 1 MG/ML IJ SOLN
1.0000 mg | INTRAMUSCULAR | 0 refills | Status: AC | PRN
Start: 2021-05-18 — End: ?

## 2021-05-18 MED ORDER — FENTANYL 25 MCG/HR TD PT72: 1.0000 | MEDICATED_PATCH | TRANSDERMAL | 0 refills | Status: AC

## 2021-05-18 MED ORDER — DIPHENHYDRAMINE HCL 50 MG/ML IJ SOLN: 25.0000 mg | Freq: Four times a day (QID) | INTRAMUSCULAR | 0 refills | Status: AC | PRN

## 2021-05-18 MED ORDER — GLYCOPYRROLATE 1 MG PO TABS: 1.0000 mg | ORAL_TABLET | ORAL | Status: AC | PRN

## 2021-05-18 MED ORDER — HYDROXYZINE HCL 10 MG PO TABS: 10.0000 mg | ORAL_TABLET | Freq: Three times a day (TID) | ORAL | 0 refills | Status: AC

## 2021-05-18 NOTE — Progress Notes (Signed)
Pt resting comfortably.  Will continue to monitor.  Report called to [facility] at [facility].  Pt waiting for transport.

## 2021-05-18 NOTE — Discharge Summary (Addendum)
Physician Discharge Summary  Tonya Dougherty GUR:[id number] DOB: May 06, 1950 DOA: 05/12/2021  PCP: Gay Filler, MD  Admit date: 05/12/2021 Discharge date: 05/18/2021  Time spent: 27 minutes  Recommendations for Outpatient Follow-up:  Patient discharging to [facility] for end-of-life care  Discharge Diagnoses:  MAIN problem for hospitalization   Metastatic colon cancer with failure to thrive and decision to be made hospice this hospitalization  Please see below for itemized issues addressed in [place]- refer to other progress notes for clarity if needed  Discharge Condition: guarded  Diet recommendation: Comfort  Filed Weights   05/13/21 1754 05/14/21 0618 05/16/21 0500  Weight: 88.6 kg 91.6 kg 88.4 kg    History of present illness:  71 year old [place] + hep B steatosis  Prior to UGI B type I varices fundus 2014 dyslipidemia DM TY 2 Sigmoid adeno CA resection 07/2019 Dr. Marcello Moores Prior extensive LLE DVT 10/24/2017   Admit 05/12/2021 acute encephalopathy in the setting of creatinine 27/0.7 baseline-->83/3.99 Also on admission felt to have a possible chest infiltrate versus UTI and started on antibiotics   Patient was eventually transitioned to hospice care during hospital stay during hospital stay and referral was made to [facility]  Hospital Course:  Metastatic colon cancer with liver disease Comfort care at this time Intractable pain altered mental status likely secondary to above Continue fentanyl patch, continue Dilaudid every 2 as needed-defer further comfort planning to [facility] Decadron was discontinued ?  Pneumonia--Proteus [name] UTI Trajectory shifted to full comfort-discontinue Rocephin and other antibiotics Severe itching likely secondary to either antibiotics underlying cirrhosis and/or opiates If able to take p.o. continue p.o. hydroxyzine 10 mg 3 times daily-if unable , suggest add a  sublingual formulation   Discharge Exam: Vitals:   05/17/21 1340 05/18/21 0547  BP: (!) 151/139 (!) 127/97  Pulse: 87 (!) 126  Resp:  20  Temp:  98.7 F (37.1 C)  SpO2: (!) 79% 97%    Subj on day of d/c   Unresponsive moans and groans in response to me stimulating her  General Exam on discharge  Obese white female no distress seems comfortable at this time Abdomen soft postop scars No lower extremity edema Mentation confused  Discharge Instructions   Discharge Instructions     Diet - low sodium heart healthy   Complete by: As directed    Increase activity slowly   Complete by: As directed    No wound care   Complete by: As directed       Allergies as of 05/18/2021       Reactions   Xarelto [rivaroxaban] Rash   Severe rash with itching   Erythromycin Other (See Comments)   Oral Thrush   Lipitor [atorvastatin] Other (See Comments)   Leg cramps   Pravachol [pravastatin] Other (See Comments)   Leg muscle cramps   Ivp Dye [iodinated Contrast Media] Rash        Medication List     STOP taking these medications    acetaminophen 500 MG tablet Commonly known as: TYLENOL   albuterol 108 (90 Base) MCG/ACT inhaler Commonly known as: VENTOLIN HFA   amantadine 100 MG capsule Commonly known as: SYMMETREL   Azelastine HCl 137 MCG/SPRAY Soln   B COMPLEX 1 PO   B-D ULTRAFINE III SHORT PEN 31G X 8 MM Misc Generic drug: Insulin Pen Needle   Calcium 600+D 600-20 MG-MCG Tabs Generic drug: Calcium Carb-Cholecalciferol   carvedilol 3.125 MG tablet Commonly known  as: COREG   cyclobenzaprine 5 MG tablet Commonly known as: FLEXERIL   diphenhydrAMINE 25 mg capsule Commonly known as: BENADRYL Replaced by: diphenhydrAMINE 50 MG/ML injection   doxycycline 100 MG tablet Commonly known as: VIBRA-TABS   Eliquis 5 MG Tabs tablet Generic drug: apixaban   escitalopram 10 MG tablet Commonly known as: LEXAPRO   ferrous gluconate 324 MG tablet Commonly known  as: FERGON   gabapentin 300 MG capsule Commonly known as: NEURONTIN   gabapentin 600 MG tablet Commonly known as: NEURONTIN   HYDROcodone-acetaminophen 7.5-325 MG tablet Commonly known as: NORCO   LACTOBACILLUS PO   lisinopril-hydrochlorothiazide 10-12.5 MG tablet Commonly known as: ZESTORETIC   NovoLOG 100 UNIT/ML injection Generic drug: insulin aspart   nystatin powder Commonly known as: MYCOSTATIN/NYSTOP   omeprazole 20 MG capsule Commonly known as: PRILOSEC   oxyCODONE 5 MG immediate release tablet Commonly known as: Oxy IR/ROXICODONE   polyethylene glycol powder 17 GM/SCOOP powder Commonly known as: GLYCOLAX/MIRALAX   predniSONE 5 MG tablet Commonly known as: DELTASONE   Salonpas Lidocaine Plus 4-10 % Crea Generic drug: Lidocaine HCl-Benzyl Alcohol   senna-docusate 8.6-50 MG tablet Commonly known as: Senokot-S   sodium chloride 0.65 % Soln nasal spray Commonly known as: OCEAN       TAKE these medications    diphenhydrAMINE 50 MG/ML injection Commonly known as: BENADRYL Inject 0.5 mLs (25 mg total) into the vein every 6 (six) hours as needed for itching. Replaces: diphenhydrAMINE 25 mg capsule   fentaNYL 25 MCG/HR Commonly known as: [place] 1 patch onto the skin every 3 (three) days. Start taking on: May 19, 2021   glycopyrrolate 1 MG tablet Commonly known as: ROBINUL Take 1 tablet (1 mg total) by mouth every 4 (four) hours as needed (excessive secretions).   HYDROmorphone 1 MG/ML injection Commonly known as: DILAUDID Inject 1 mL (1 mg total) into the vein every 2 (two) hours as needed for severe pain or moderate pain.   hydrOXYzine 10 MG tablet Commonly known as: ATARAX Take 1 tablet (10 mg total) by mouth 3 (three) times daily.       Allergies  Allergen Reactions   Xarelto [Rivaroxaban] Rash    Severe rash with itching   Erythromycin Other (See Comments)    Oral Thrush   Lipitor [Atorvastatin] Other (See Comments)     Leg cramps   Pravachol [Pravastatin] Other (See Comments)    Leg muscle cramps   Ivp Dye [Iodinated Contrast Media] Rash      The results of significant diagnostics from this hospitalization (including imaging, microbiology, ancillary and laboratory) are listed below for reference.    Significant Diagnostic Studies: CT Head Wo Contrast  Result Date: 05/12/2021 CLINICAL DATA:  Altered mental status. EXAM: CT HEAD WITHOUT CONTRAST TECHNIQUE: Contiguous axial images were obtained from the base of the skull through the vertex without intravenous contrast. RADIATION DOSE REDUCTION: This exam was performed according to the departmental dose-optimization program which includes automated exposure control, adjustment of the mA and/or kV according to patient size and/or use of iterative reconstruction technique. COMPARISON:  April 09, 2007 FINDINGS: Brain: There is mild cerebral atrophy with widening of the extra-axial spaces and ventricular dilatation. There are areas of decreased attenuation within the white matter tracts of the supratentorial brain, consistent with microvascular disease changes. Vascular: No hyperdense vessel or unexpected calcification. Skull: Normal. Negative for fracture or focal lesion. Sinuses/Orbits: There is marked severity bilateral ethmoid sinus, sphenoid sinus and left-sided frontal sinus mucosal thickening. Other:  None. IMPRESSION: 1. No acute intracranial abnormality. 2. Generalized cerebral atrophy with microvascular disease changes within the white matter tracts of the supratentorial brain. 3. Marked severity bilateral ethmoid sinus, sphenoid sinus and left-sided frontal sinus disease. Electronically Signed   By: Virgina Norfolk M.D.   On: 05/12/2021 20:05   CT Chest Wo Contrast  Result Date: 04/29/2021 CLINICAL DATA:  Metastatic colon cancer restaging EXAM: CT CHEST WITHOUT CONTRAST TECHNIQUE: Multidetector CT imaging of the chest was performed following the standard  protocol without IV contrast. COMPARISON:  12/27/2020 FINDINGS: Cardiovascular: Aortic atherosclerosis. Normal heart size. Three-vessel coronary artery calcifications. No pericardial effusion. Mediastinum/Nodes: No enlarged mediastinal, hilar, or axillary lymph nodes. Enlarged, heterogeneous thyroid, as seen on prior examination. Trachea, and esophagus demonstrate no significant findings. Lungs/Pleura: Numerous new and enlarged bilateral pulmonary nodules, index nodule in the right pulmonary apex measuring 1.4 x 1.1 cm, previously 0.8 x 0.8 cm (series 6, image 30), index nodule of the lingula measuring 1.3 x 1.0 cm, previously 1.0 x 0.5 cm (series 6, image 62), and of the right lower lobe measuring 1.0 x 0.9 cm, previously 0.6 x 0.6 cm (series 6, image 69). Redemonstrated irregular scarring and architectural distortion in the anterior right upper lobe (series 6, image 45). Upper Abdomen: No acute abnormality. Coarse, nodular, cirrhotic morphology of the liver and partially imaged splenomegaly. Calcified gallstones and sludge in the gallbladder, partially imaged. Musculoskeletal: No chest wall mass or suspicious bone lesions identified. IMPRESSION: 1. Numerous new and enlarged bilateral pulmonary nodules, consistent with worsened pulmonary metastatic disease. 2. Coronary artery disease. 3. Coarse, nodular, cirrhotic morphology of the liver and partially imaged splenomegaly in the included upper abdomen. 4. Cholelithiasis. 5. Enlarged, heterogeneous thyroid. In the setting of significant comorbidities or limited life expectancy, no follow-up recommended (ref: J Am Coll Radiol. 2015 Feb;12(2): 143-50). Aortic Atherosclerosis (ICD10-I70.0). Electronically Signed   By: Delanna Ahmadi M.D.   On: 04/29/2021 18:29   CT THORACIC SPINE WO CONTRAST  Result Date: 05/15/2021 CLINICAL DATA:  Metastatic disease evaluation. EXAM: CT THORACIC SPINE WITHOUT CONTRAST TECHNIQUE: Multidetector CT images of the thoracic were obtained  using the standard protocol without intravenous contrast. RADIATION DOSE REDUCTION: This exam was performed according to the departmental dose-optimization program which includes automated exposure control, adjustment of the mA and/or kV according to patient size and/or use of iterative reconstruction technique. COMPARISON:  04/28/2021, 04/24/2020. FINDINGS: Examination is limited due to motion artifact, patient unable to hold still for exam. Alignment: Normal. Vertebrae: No acute fracture or focal pathologic process. Paraspinal and other soft tissues: The paraspinal soft tissues are within normal limits. There is atherosclerotic calcification of the aorta. The thyroid gland is enlarged and heterogeneous, unchanged from the prior exam. Scattered pulmonary nodules are noted bilaterally and better evaluated on prior exam. Disc levels: Mild multilevel intervertebral disc space narrowing and endplate osteophyte formation is noted. No significant spinal canal stenosis. IMPRESSION: 1. No evidence of acute fracture or metastatic disease. 2. Multiple pulmonary nodules bilaterally, better evaluated on prior CT chest. 3. Aortic atherosclerosis. Electronically Signed   By: Brett Fairy M.D.   On: 05/15/2021 20:40   CT LUMBAR SPINE WO CONTRAST  Result Date: 05/15/2021 CLINICAL DATA:  Metastatic disease evaluation. EXAM: CT LUMBAR SPINE WITHOUT CONTRAST TECHNIQUE: Multidetector CT imaging of the lumbar spine was performed without intravenous contrast administration. Multiplanar CT image reconstructions were also generated. RADIATION DOSE REDUCTION: This exam was performed according to the departmental dose-optimization program which includes automated exposure control, adjustment of the mA  and/or kV according to patient size and/or use of iterative reconstruction technique. COMPARISON:  03/25/2020, December 27, 2020. FINDINGS: Examination is limited due to motion artifact, as patient was unable to hold still for examination.  Segmentation: 5 lumbar type vertebrae. Alignment: There is mild anterolisthesis at L4-L5. Vertebrae: There is a nondisplaced fracture of the transverse process at L5 on the left. Ill-defined lucencies are present in the proximal sacrum with suggestion of nondisplaced fractures of the visualized sacrum bilaterally in zone 3 on the right in zone 2 on the left, incompletely visualized on this exam. Paraspinal and other soft tissues: A soft tissue mass is associated with lucencies present in the sacrum extending into the surrounding soft tissues. Disc levels: T12-L1: There is mild endplate osteophyte formation with no significant spinal canal or neural foraminal stenosis. L1-L2: There is a broad-based disc herniation with endplate osteophyte formation resulting in mild spinal canal stenosis. The neural foramina are within normal limits. L2-L3: There is a broad-based disc herniation with endplate osteophyte formation and facet arthropathy resulting in moderate spinal canal stenosis. The left neural foramen is within normal limits. There is mild neural foraminal stenosis on the right. L3-L4: There is diffuse disc herniation with endplate osteophyte formation and facet arthropathy resulting in severe spinal canal stenosis. Moderate neural foraminal stenosis is present bilaterally. L4-L5: There is a diffuse disc herniation with endplate osteophyte formation and facet arthropathy resulting in severe spinal canal stenosis. The spinal canal and thecal sac are compressed and not well visualized on exam. There is moderate neural foraminal stenosis on the right and severe neural foraminal stenosis on the left. L5-S1: There is a disc herniation with endplate osteophyte formation and facet arthropathy resulting in moderate spinal canal stenosis. Severe neural foraminal stenosis is present bilaterally. The spinal canal in the proximal sacrum is not well delineated in the possibility of soft tissue tumor mass compressing the canal  can not be excluded. IMPRESSION: 1. Ill-defined lucencies with associated soft tissue mass in the proximal sacrum, suggesting metastatic disease. There are fractures of the proximal sacrum bilaterally, likely pathologic. There is also a fracture of the transverse process at L5 on the left. 2. Multilevel degenerative changes and disc herniations resulting in severe spinal canal and neural foraminal stenosis at multiple levels most pronounced at L4-L5. Follow-up with MRI and surgical consultation is recommended. 3. Continuation of soft tissue attenuation in the sacral spinal canal concerning for tumor infiltration. Findings may be also better evaluated with MRI. Electronically Signed   By: Brett Fairy M.D.   On: 05/15/2021 21:28   DG Chest Port 1 View  Result Date: 05/12/2021 CLINICAL DATA:  Weakness. EXAM: PORTABLE CHEST 1 VIEW COMPARISON:  April 30, 2019. FINDINGS: Stable cardiomediastinal silhouette. Left lung is clear. Mild right basilar subsegmental atelectasis or possibly infiltrate is noted. The visualized skeletal structures are unremarkable. IMPRESSION: Mild right basilar subsegmental atelectasis or possibly infiltrate. Aortic Atherosclerosis (ICD10-I70.0). Electronically Signed   By: Marijo Conception M.D.   On: 05/12/2021 15:43    Microbiology: Recent Results (from the past 240 hour(s))  Culture, blood (routine x 2)     Status: None   Collection Time: 05/12/21  3:29 PM   Specimen: BLOOD  Result Value Ref Range Status   Specimen Description   Final    BLOOD BLOOD LEFT HAND Performed at [facility], [place] [address] [place], [place] [address]    Special Requests   Final    BOTTLES DRAWN AEROBIC AND ANAEROBIC Blood Culture  results may not be optimal due to an inadequate volume of blood received in culture bottles Performed at [facility], [place] [address] [place], [place] [address]    Culture   Final    NO GROWTH 5 DAYS Performed at [facility], [place] [address] [place], [place] [address]    Report Status 05/17/2021 FINAL  Final  Culture, blood (routine x 2)     Status: Abnormal   Collection Time: 05/12/21  3:59 PM   Specimen: Site Not Specified; Blood  Result Value Ref Range Status   Specimen Description   Final    SITE NOT SPECIFIED BLOOD Performed at [facility] [address], [place], [place] [address]    Special Requests   Final    BOTTLES DRAWN AEROBIC AND ANAEROBIC Blood Culture adequate volume Performed at [place] [address] [place], [place] [address]    Culture  Setup Time   Final    GRAM POSITIVE COCCI IN CLUSTERS IN BOTH AEROBIC AND ANAEROBIC BOTTLES CRITICAL RESULT CALLED TO, READ BACK BY AND VERIFIED WITH: T,GREEN PHARMD @1632  05/13/21 EB    Culture (A)  Final    STAPHYLOCOCCUS EPIDERMIDIS THE SIGNIFICANCE OF ISOLATING THIS ORGANISM FROM A SINGLE SET OF BLOOD CULTURES WHEN MULTIPLE SETS ARE DRAWN IS UNCERTAIN. PLEASE NOTIFY THE MICROBIOLOGY DEPARTMENT WITHIN ONE WEEK IF SPECIATION AND SENSITIVITIES ARE REQUIRED. Performed at [facility], [place] [address] [place],  [address]    Report Status 05/15/2021 FINAL  Final  Blood Culture ID Panel (Reflexed)     Status: Abnormal   Collection Time: 05/12/21  3:59 PM  Result Value Ref Range Status   Enterococcus faecalis NOT DETECTED NOT DETECTED Final   Enterococcus Faecium NOT DETECTED NOT DETECTED Final   Listeria monocytogenes NOT DETECTED NOT DETECTED Final   Staphylococcus species DETECTED (A) NOT DETECTED Final    Comment: CRITICAL RESULT CALLED TO, READ BACK BY AND VERIFIED WITH: T,GREEN PHARMD @1632  05/13/21 EB    Staphylococcus aureus (BCID) NOT DETECTED NOT DETECTED Final   Staphylococcus epidermidis DETECTED (A) NOT DETECTED Final    Comment: Methicillin (oxacillin) resistant coagulase negative staphylococcus. Possible blood culture contaminant (unless isolated from more than one blood  culture draw or clinical case suggests pathogenicity). No antibiotic treatment is indicated for blood  culture contaminants. CRITICAL RESULT CALLED TO, READ BACK BY AND VERIFIED WITH: T,GREEN PHARMD @1632  05/13/21 EB    Staphylococcus lugdunensis NOT DETECTED NOT DETECTED Final   Streptococcus species NOT DETECTED NOT DETECTED Final   Streptococcus agalactiae NOT DETECTED NOT DETECTED Final   Streptococcus pneumoniae NOT DETECTED NOT DETECTED Final   Streptococcus pyogenes NOT DETECTED NOT DETECTED Final   A.calcoaceticus-baumannii NOT DETECTED NOT DETECTED Final   Bacteroides fragilis NOT DETECTED NOT DETECTED Final   Enterobacterales NOT DETECTED NOT DETECTED Final   Enterobacter cloacae complex NOT DETECTED NOT DETECTED Final   Escherichia coli NOT DETECTED NOT DETECTED Final   Klebsiella aerogenes NOT DETECTED NOT DETECTED Final   Klebsiella oxytoca NOT DETECTED NOT DETECTED Final   Klebsiella pneumoniae NOT DETECTED NOT DETECTED Final   Proteus species NOT DETECTED NOT DETECTED Final   Salmonella species NOT DETECTED NOT DETECTED Final   Serratia marcescens NOT DETECTED NOT DETECTED Final   Haemophilus influenzae NOT DETECTED NOT DETECTED Final   Neisseria meningitidis NOT DETECTED NOT DETECTED Final   Pseudomonas aeruginosa NOT DETECTED NOT DETECTED Final   Stenotrophomonas maltophilia NOT DETECTED NOT DETECTED  Final   Candida albicans NOT DETECTED NOT DETECTED Final   Candida auris NOT DETECTED NOT DETECTED Final   Candida glabrata NOT DETECTED NOT DETECTED Final   Candida krusei NOT DETECTED NOT DETECTED Final   Candida parapsilosis NOT DETECTED NOT DETECTED Final   Candida tropicalis NOT DETECTED NOT DETECTED Final   Cryptococcus neoformans/gattii NOT DETECTED NOT DETECTED Final   Methicillin resistance mecA/C DETECTED (A) NOT DETECTED Final    Comment: CRITICAL RESULT CALLED TO, READ BACK BY AND VERIFIED WITH: T,GREEN PHARMD @1632  05/13/21 EB Performed at [facility] [address] [place], [place] [address]   Resp Panel by RT-PCR (Flu A&B, Covid) Nasopharyngeal Swab     Status: None   Collection Time: 05/12/21  5:55 PM   Specimen: Nasopharyngeal Swab; Nasopharyngeal(NP) swabs in vial transport medium  Result Value Ref Range Status   SARS Coronavirus 2 by RT PCR NEGATIVE NEGATIVE Final    Comment: (NOTE) SARS-CoV-2 target nucleic acids are NOT DETECTED.  The SARS-CoV-2 RNA is generally detectable in upper respiratory specimens during the acute phase of infection. The lowest concentration of SARS-CoV-2 viral copies this assay can detect is 138 copies/mL. A negative result does not preclude SARS-Cov-2 infection and should not be used as the sole basis for treatment or other patient management decisions. A negative result may occur with  improper specimen collection/handling, submission of specimen other than nasopharyngeal swab, presence of viral mutation(s) within the areas targeted by this assay, and inadequate number of viral copies(<138 copies/mL). A negative result must be combined with clinical observations, patient history, and epidemiological information. The expected result is Negative.  Fact Sheet for Patients:  EntrepreneurPulse.com.au  Fact Sheet for Healthcare Providers:  IncredibleEmployment.be  This test is no t yet approved or cleared by the [place] FDA and  has been authorized for detection and/or diagnosis of SARS-CoV-2 by FDA under an Emergency Use Authorization (EUA). This EUA will remain  in effect (meaning this test can be used) for the duration of the COVID-19 declaration under Section 564(b)(1) of the Act, 21 U.S.C.section 360bbb-3(b)(1), unless the authorization is terminated  or revoked sooner.       Influenza A by PCR NEGATIVE NEGATIVE Final   Influenza B by PCR NEGATIVE NEGATIVE Final    Comment: (NOTE) The Xpert Xpress SARS-CoV-2/FLU/RSV plus assay is intended  as an aid in the diagnosis of influenza from Nasopharyngeal swab specimens and should not be used as a sole basis for treatment. Nasal washings and aspirates are unacceptable for Xpert Xpress SARS-CoV-2/FLU/RSV testing.  Fact Sheet for Patients: EntrepreneurPulse.com.au  Fact Sheet for Healthcare Providers: IncredibleEmployment.be  This test is not yet approved or cleared by the [place] FDA and has been authorized for detection and/or diagnosis of SARS-CoV-2 by FDA under an Emergency Use Authorization (EUA). This EUA will remain in effect (meaning this test can be used) for the duration of the COVID-19 declaration under Section 564(b)(1) of the Act, 21 U.S.C. section 360bbb-3(b)(1), unless the authorization is terminated or revoked.  Performed at [facility], [place] [address], [place], [place] [address]   Urine Culture     Status: Abnormal   Collection Time: 05/12/21  7:17 PM   Specimen: Urine, Catheterized  Result Value Ref Range Status   Specimen Description   Final    URINE, CATHETERIZED Performed at [facility] [address] [place], [place] [address]    Special Requests   Final    NONE Performed  at [facility], [place] [address], [place], [place] [address]    Culture >=100,000 COLONIES/mL PROTEUS MIRABILIS (A)  Final   Report Status 05/16/2021 FINAL  Final   Organism ID, Bacteria PROTEUS MIRABILIS (A)  Final      Susceptibility   Proteus mirabilis - MIC*    AMPICILLIN <=2 SENSITIVE Sensitive     CEFAZOLIN <=4 SENSITIVE Sensitive     CEFEPIME <=0.12 SENSITIVE Sensitive     CEFTRIAXONE <=0.25 SENSITIVE Sensitive     CIPROFLOXACIN 2 RESISTANT Resistant     GENTAMICIN <=1 SENSITIVE Sensitive     IMIPENEM 1 SENSITIVE Sensitive     NITROFURANTOIN 128 RESISTANT Resistant     TRIMETH/SULFA <=20 SENSITIVE Sensitive     AMPICILLIN/SULBACTAM <=2 SENSITIVE Sensitive      PIP/TAZO <=4 SENSITIVE Sensitive     * >=100,000 COLONIES/mL PROTEUS MIRABILIS  Culture, blood (routine x 2)     Status: None (Preliminary result)   Collection Time: 05/14/21  9:00 AM   Specimen: BLOOD  Result Value Ref Range Status   Specimen Description   Final    BLOOD BLOOD LEFT FOREARM Performed at [facility] [facility] [address] [place], [place] [address]    Special Requests   Final    BOTTLES DRAWN AEROBIC ONLY Blood Culture adequate volume Performed at [facility] [address] [place], [place] [address]    Culture   Final    NO GROWTH 4 DAYS Performed at [facility], [place] [address], [place], [place] [address]    Report Status PENDING  Incomplete  Culture, blood (routine x 2)     Status: None (Preliminary result)   Collection Time: 05/14/21  9:00 AM   Specimen: BLOOD  Result Value Ref Range Status   Specimen Description   Final    BLOOD LEFT ANTECUBITAL Performed at [facility] [address], [place], [place] [address]    Special Requests   Final    BOTTLES DRAWN AEROBIC ONLY Blood Culture adequate volume Performed at [facility] [address], [place], [place] [address]    Culture   Final    NO GROWTH 4 DAYS Performed at [facility], [place] [address] [place], [place] [address]    Report Status PENDING  Incomplete     Labs: Basic Metabolic Panel: Recent Labs  Lab 05/12/21 1524 05/13/21 0658 05/15/21 0428  NA 137 140 143  K 4.1 4.8 3.7  CL 104 111 109  CO2 21* 22 26  GLUCOSE 100* 85 106*  BUN 83* 63* 31*  CREATININE 3.99* 1.34* 0.78  CALCIUM 8.9 8.9 9.6   Liver Function Tests: Recent Labs  Lab 05/12/21 1524 05/13/21 0658  AST 47* 65*  ALT 27 30  ALKPHOS 120 118  BILITOT 1.1 1.5*  PROT 6.8 6.8  ALBUMIN 3.1* 3.1*   No results for input(s): LIPASE, AMYLASE in the last 168 hours. Recent Labs  Lab 05/13/21 1315  AMMONIA 43*   CBC: Recent Labs   Lab 05/12/21 1524 05/13/21 0658 05/15/21 0428  WBC 8.3 6.8 4.7  NEUTROABS 5.5  --  2.7  HGB 11.0* 10.2* 10.8*  HCT 36.2 33.9* 36.5  MCV 96.8 98.0 98.4  PLT 201 185 191   Cardiac Enzymes: No results for input(s): CKTOTAL, CKMB, CKMBINDEX, TROPONINI in the last 168 hours. BNP: BNP (last 3 results) No results for input(s): BNP in the last 8760 hours.  ProBNP (last 3  results) No results for input(s): PROBNP in the last 8760 hours.  CBG: Recent Labs  Lab 05/16/21 0729 05/16/21 1155 05/16/21 1743 05/16/21 2134 05/17/21 0805  GLUCAP 132* 100* 120* 124* 117*       Signed:  Nita Sells MD   [facility] 05/18/2021, 9:48 AM

## 2021-05-18 NOTE — TOC Transition Note (Signed)
Transition of Care [facility]) - CM/SW Discharge Note   Patient Details  Name: Tonya Dougherty MRN: [id number] Date of Birth: Nov 23, 1950  [facility] of [facility]) CM/SW Contact:  Dessa Phi, RN Phone Number: 05/18/2021, 1:31 PM   Clinical Narrative: PTAR called.No further CM needs.      Final next level of care: [place] Barriers to Discharge: No Barriers Identified   Patient Goals and CMS Choice Patient states their goals for this hospitalization and ongoing recovery are:: [facility] CMS Medicare.gov Compare Post Acute Care list provided to:: Patient Represenative (must comment) [name] sister [phone]) Choice offered to / list presented to : Sibling  Discharge Placement              Patient chooses bed at: Other - please specify in the comment section below: ([facility]) Patient to be transferred to facility by: [place] Name of family member notified: [name] sister [phone] Patient and family notified of of transfer: 05/18/21  Discharge Plan and Services   Discharge Planning Services: CM Consult                                 Social Determinants of Health ([place]) Interventions     Readmission Risk Interventions Readmission Risk Prevention Plan 05/18/2021 03/30/2020  Transportation Screening Complete Complete  PCP or Specialist Appt within 5-7 Days - Complete  PCP or Specialist Appt within 3-5 Days Complete -  Home Care Screening - Complete  Medication Review (RN CM) - Complete  HRI or Home Care Consult Complete -  Social Work Consult for [place] Planning/Counseling Complete -  Medication Review Press photographer) Complete -  Some recent data might be hidden

## 2021-05-18 NOTE — TOC Transition Note (Signed)
Transition of Care [facility]) - CM/SW Discharge Note   Patient Details  Name: Tonya Dougherty MRN: [id number] Date of Birth: 01-24-1951  Transition [facility]) CM/SW Contact:  Dessa Phi, RN Phone Number: 05/18/2021, 10:05 AM   Clinical Narrative:  Awaiting confirmation from [facility] bed availability prior PTAR,nsg call report tel#[phone].     Final next level of care: [place] Barriers to Discharge: No Barriers Identified   Patient Goals and CMS Choice Patient states their goals for this hospitalization and ongoing recovery are:: [facility] CMS Medicare.gov Compare Post Acute Care list provided to:: Patient Represenative (must comment) [name] sister [phone]) Choice offered to / list presented to : Sibling  Discharge Placement              Patient chooses bed at: Other - please specify in the comment section below: ([place]) Patient to be transferred to facility by: [place] Name of family member notified: [name] sister [phone] Patient and family notified of of transfer: 05/18/21  Discharge Plan and Services   Discharge Planning Services: CM Consult                                 Social Determinants of Health ([place]) Interventions     Readmission Risk Interventions Readmission Risk Prevention Plan 05/18/2021 03/30/2020  Transportation Screening Complete Complete  PCP or Specialist Appt within 5-7 Days - Complete  PCP or Specialist Appt within 3-5 Days Complete -  Home Care Screening - Complete  Medication Review (RN CM) - Complete  HRI or Home Care Consult Complete -  Social Work Consult for [place] Planning/Counseling Complete -  Medication Review Press photographer) Complete -  Some recent data might be hidden

## 2021-05-18 NOTE — Plan of Care (Signed)
°  Problem: Education: Goal: Knowledge of General Education information will improve Description: Including pain rating scale, medication(s)/side effects and non-pharmacologic comfort measures Outcome: Adequate for Discharge   Problem: Activity: Goal: Risk for activity intolerance will decrease Outcome: Adequate for Discharge   Problem: Pain Managment: Goal: General experience of comfort will improve Outcome: Adequate for Discharge   Problem: Safety: Goal: Ability to remain free from injury will improve Outcome: Adequate for Discharge   Problem: Skin Integrity: Goal: Risk for impaired skin integrity will decrease Outcome: Adequate for Discharge

## 2021-05-18 NOTE — Progress Notes (Signed)
[id number]  [facility]) Hospital Liaison Note  Bed offered and accepted for today. Unit RN please call report to [phone] prior to patient leaving the unit.   Please send signed DNR and paperwork with patient.   Please call with any questions or hospice concerns.   [facility] Hospital Liaison  [phone]

## 2021-05-18 NOTE — Progress Notes (Signed)
Pt transferred to hospice via EMS.  Sister, [name], notified pt discharged from hospital and transported to [facility].

## 2021-05-19 ENCOUNTER — Ambulatory Visit: Payer: Medicare HMO | Admitting: Nurse Practitioner

## 2021-05-19 ENCOUNTER — Inpatient Hospital Stay: Payer: Medicare HMO | Admitting: Nurse Practitioner

## 2021-05-19 LAB — CULTURE, BLOOD (ROUTINE X 2)
Culture: NO GROWTH
Culture: NO GROWTH
Special Requests: ADEQUATE
Special Requests: ADEQUATE

## 2021-05-31 DEATH — deceased
